# Patient Record
Sex: Female | Born: 1975 | Race: Black or African American | Hispanic: No | Marital: Single | Smoking: Former smoker
Health system: Southern US, Community
[De-identification: ages and names within clinical notes are randomized; demographics above are authoritative.]

## PROBLEM LIST (undated history)

## (undated) ENCOUNTER — Emergency Department (HOSPITAL_BASED_OUTPATIENT_CLINIC_OR_DEPARTMENT_OTHER): Source: Home / Self Care

## (undated) DIAGNOSIS — J471 Bronchiectasis with (acute) exacerbation: Secondary | ICD-10-CM

## (undated) DIAGNOSIS — J45909 Unspecified asthma, uncomplicated: Secondary | ICD-10-CM

## (undated) HISTORY — PX: TUBAL LIGATION: SHX77

---

## 1997-05-24 ENCOUNTER — Inpatient Hospital Stay (HOSPITAL_COMMUNITY): Admission: AD | Admit: 1997-05-24 | Discharge: 1997-05-24 | Payer: Self-pay | Admitting: Cardiology

## 1997-07-20 ENCOUNTER — Inpatient Hospital Stay (HOSPITAL_COMMUNITY): Admission: AD | Admit: 1997-07-20 | Discharge: 1997-07-20 | Payer: Self-pay | Admitting: Obstetrics

## 1997-08-17 ENCOUNTER — Other Ambulatory Visit: Admission: RE | Admit: 1997-08-17 | Discharge: 1997-08-17 | Payer: Self-pay | Admitting: Obstetrics

## 1997-09-23 ENCOUNTER — Inpatient Hospital Stay (HOSPITAL_COMMUNITY): Admission: AD | Admit: 1997-09-23 | Discharge: 1997-09-26 | Payer: Self-pay | Admitting: Obstetrics

## 1998-07-05 ENCOUNTER — Inpatient Hospital Stay (HOSPITAL_COMMUNITY): Admission: AD | Admit: 1998-07-05 | Discharge: 1998-07-09 | Payer: Self-pay | Admitting: Obstetrics & Gynecology

## 1998-07-05 ENCOUNTER — Encounter: Payer: Self-pay | Admitting: Obstetrics & Gynecology

## 1998-07-07 ENCOUNTER — Encounter: Payer: Self-pay | Admitting: Obstetrics & Gynecology

## 1998-08-09 ENCOUNTER — Inpatient Hospital Stay (HOSPITAL_COMMUNITY): Admission: AD | Admit: 1998-08-09 | Discharge: 1998-08-09 | Payer: Self-pay | Admitting: Obstetrics

## 1998-08-09 ENCOUNTER — Encounter: Payer: Self-pay | Admitting: Obstetrics & Gynecology

## 1998-08-16 ENCOUNTER — Inpatient Hospital Stay (HOSPITAL_COMMUNITY): Admission: AD | Admit: 1998-08-16 | Discharge: 1998-08-18 | Payer: Self-pay | Admitting: Obstetrics

## 1999-06-12 ENCOUNTER — Emergency Department (HOSPITAL_COMMUNITY): Admission: EM | Admit: 1999-06-12 | Discharge: 1999-06-12 | Payer: Self-pay | Admitting: Emergency Medicine

## 1999-08-21 ENCOUNTER — Emergency Department (HOSPITAL_COMMUNITY): Admission: EM | Admit: 1999-08-21 | Discharge: 1999-08-21 | Payer: Self-pay | Admitting: Emergency Medicine

## 2000-03-07 ENCOUNTER — Inpatient Hospital Stay (HOSPITAL_COMMUNITY): Admission: AD | Admit: 2000-03-07 | Discharge: 2000-03-07 | Payer: Self-pay | Admitting: Obstetrics & Gynecology

## 2000-03-13 ENCOUNTER — Ambulatory Visit (HOSPITAL_COMMUNITY): Admission: RE | Admit: 2000-03-13 | Discharge: 2000-03-13 | Payer: Self-pay | Admitting: Obstetrics

## 2000-03-13 ENCOUNTER — Encounter: Payer: Self-pay | Admitting: Obstetrics

## 2000-03-29 ENCOUNTER — Inpatient Hospital Stay (HOSPITAL_COMMUNITY): Admission: AD | Admit: 2000-03-29 | Discharge: 2000-03-29 | Payer: Self-pay | Admitting: Obstetrics & Gynecology

## 2000-04-10 ENCOUNTER — Inpatient Hospital Stay (HOSPITAL_COMMUNITY): Admission: AD | Admit: 2000-04-10 | Discharge: 2000-04-10 | Payer: Self-pay | Admitting: *Deleted

## 2000-06-04 ENCOUNTER — Inpatient Hospital Stay: Admission: AD | Admit: 2000-06-04 | Discharge: 2000-06-04 | Payer: Self-pay | Admitting: Obstetrics

## 2000-06-19 ENCOUNTER — Inpatient Hospital Stay (HOSPITAL_COMMUNITY): Admission: RE | Admit: 2000-06-19 | Discharge: 2000-06-19 | Payer: Self-pay | Admitting: *Deleted

## 2000-06-22 ENCOUNTER — Encounter (HOSPITAL_COMMUNITY): Admission: RE | Admit: 2000-06-22 | Discharge: 2000-06-26 | Payer: Self-pay | Admitting: *Deleted

## 2000-06-24 ENCOUNTER — Inpatient Hospital Stay (HOSPITAL_COMMUNITY): Admission: AD | Admit: 2000-06-24 | Discharge: 2000-06-27 | Payer: Self-pay | Admitting: Obstetrics

## 2000-06-24 ENCOUNTER — Encounter (INDEPENDENT_AMBULATORY_CARE_PROVIDER_SITE_OTHER): Payer: Self-pay | Admitting: Specialist

## 2001-10-12 ENCOUNTER — Encounter: Payer: Self-pay | Admitting: Emergency Medicine

## 2001-10-12 ENCOUNTER — Emergency Department (HOSPITAL_COMMUNITY): Admission: EM | Admit: 2001-10-12 | Discharge: 2001-10-12 | Payer: Self-pay | Admitting: Emergency Medicine

## 2002-11-09 ENCOUNTER — Emergency Department (HOSPITAL_COMMUNITY): Admission: EM | Admit: 2002-11-09 | Discharge: 2002-11-09 | Payer: Self-pay | Admitting: *Deleted

## 2003-08-12 ENCOUNTER — Emergency Department (HOSPITAL_COMMUNITY): Admission: EM | Admit: 2003-08-12 | Discharge: 2003-08-12 | Payer: Self-pay | Admitting: Emergency Medicine

## 2003-11-27 ENCOUNTER — Emergency Department (HOSPITAL_COMMUNITY): Admission: EM | Admit: 2003-11-27 | Discharge: 2003-11-28 | Payer: Self-pay | Admitting: *Deleted

## 2005-12-17 ENCOUNTER — Emergency Department (HOSPITAL_COMMUNITY): Admission: EM | Admit: 2005-12-17 | Discharge: 2005-12-17 | Payer: Self-pay | Admitting: Emergency Medicine

## 2006-05-24 ENCOUNTER — Emergency Department (HOSPITAL_COMMUNITY): Admission: EM | Admit: 2006-05-24 | Discharge: 2006-05-24 | Payer: Self-pay | Admitting: Emergency Medicine

## 2007-04-17 ENCOUNTER — Inpatient Hospital Stay (HOSPITAL_COMMUNITY): Admission: AD | Admit: 2007-04-17 | Discharge: 2007-04-17 | Payer: Self-pay | Admitting: Obstetrics and Gynecology

## 2008-06-14 ENCOUNTER — Emergency Department (HOSPITAL_COMMUNITY): Admission: EM | Admit: 2008-06-14 | Discharge: 2008-06-14 | Payer: Self-pay | Admitting: Family Medicine

## 2009-05-27 ENCOUNTER — Emergency Department (HOSPITAL_COMMUNITY): Admission: EM | Admit: 2009-05-27 | Discharge: 2009-05-27 | Payer: Self-pay | Admitting: Emergency Medicine

## 2009-07-21 ENCOUNTER — Emergency Department (HOSPITAL_COMMUNITY): Admission: EM | Admit: 2009-07-21 | Discharge: 2009-07-21 | Payer: Self-pay | Admitting: Family Medicine

## 2010-07-08 LAB — POCT I-STAT, CHEM 8
Calcium, Ion: 1.19 mmol/L (ref 1.12–1.32)
Chloride: 106 mEq/L (ref 96–112)
HCT: 40 % (ref 36.0–46.0)
Hemoglobin: 13.6 g/dL (ref 12.0–15.0)
Potassium: 3.6 mEq/L (ref 3.5–5.1)

## 2010-07-08 LAB — POCT PREGNANCY, URINE: Preg Test, Ur: NEGATIVE

## 2010-08-02 LAB — POCT PREGNANCY, URINE: Preg Test, Ur: NEGATIVE

## 2010-09-02 NOTE — Discharge Summary (Signed)
[facility]  Patient:    Wendy Morgan, Wendy Morgan                       MRN: [id number] Adm. Date:  [id number] Disc. Date: [id number] Attending:  Tammi Sou Dictator:   Marta Lamas, M.D.                           Discharge Summary  ADMIT DIAGNOSES:              1. Intrauterine pregnancy at 38 weeks and 3                                  days.                               2. Labor.  DISCHARGE DIAGNOSES:          1. Vaginal birth delivery after cesarean                                  section.                               2. Permanent sterilization.  PROCEDURE:                    Bilateral tubal ligation-Pomeroy method, June 26, 2000.  CONSULTS:                     None.  HISTORY OF PRESENT ILLNESS:   Patient is a 35 year old African-American female G4, para 3-1-0-3 who was admitted for active labor.  See H&P for details.  HOSPITAL COURSE:              Patient was admitted to labor and delivery for active labor as noted above.  Epidural was requested and placed.  Patient had history of GBS with previous pregnancy, but was negative for GBS this pregnancy.  Thus, patient was started on penicillin during labor.  Patient had very slow labor, thus Pitocin was started to augment labor.  The fetus remained stable throughout the patients labor course.  Six hours after admission patient delivered successfully by spontaneous vaginal delivery over intact perineum.  Patient had a viable female infant with Apgars 8 and 9 at one and five minutes respectively.  The placenta was delivered intact with three vessel cord.  The mother and infant were stable.  Patient did well postpartum.  On postpartum day #1 patient had bilateral tubal ligation done. The procedure went well without any complications.  See operative report for details.  On hospital day #3 postoperative day #1 patient did well and was discharged with routine postpartum and postoperative  care.  DISCHARGE MEDICATIONS:        1. Percocet p.r.n.                               2. Motrin p.r.n.                               3. Prenatal vitamins.  CONDITION ON DISCHARGE:  Stable.  DISPOSITION:                  Home.  FOLLOW-UP:                    [facility] DD:  07/12/00 TD:  07/12/00 Job: [id number]

## 2010-09-02 NOTE — Op Note (Signed)
[facility]  Patient:    Wendy Morgan, Wendy Morgan                       MRN: [id number] Proc. Date: 06/26/00 Adm. Date:  [id number] Disc. Date: [id number] Attending:  Michaelle Copas Dictator:   Jamey Reas, M.D.                           Operative Report  DATE OF BIRTH:                07/31/1975  PREOPERATIVE DIAGNOSIS:       Multiparity, desires permanent sterilization.  POSTOPERATIVE DIAGNOSIS:      Multiparity, desires permanent sterilization.  OPERATION:                    Postpartum tubal ligation, Pomeroy method.  SURGEON:                      Conni Elliot, M.D.  ASSISTANT:                    1. Jamey Reas, M.D.                               2. ______  ANESTHESIA:                   General endotracheal.  COMPLICATIONS:                None.  ESTIMATED BLOOD LOSS:         Less than 20 cc.  FLUIDS:                       1 L of lactated ringers.  INDICATIONS:                  A 35 year old African-American female G4, P3 1 0 4 postpartum day #1, status post successful vaginal birth after cesarean, who desires permanent sterilization.  The risks and benefits of the procedure were discussed with the patient including the risk of failure 3-5:1000 with the increased risk of ectopic gestation if pregnancy occurs.  FINDINGS:                     Normal uterus, tubes, and ovaries with occasional adhesion; otherwise normal.  PROCEDURE:                    The patient was taken to the operating room, where general endotracheal anesthesia was introduced and 6 cc of 0.50% Marcaine was introduced into the incision site.  A small, transverse infraumbilical skin incision was then made with the scalpel.  The incision was carried through to the underlying fascia, until the peritoneum was identified and entered.  The peritoneum was noted to be free of any adhesions and the incision was then extended with blunt dissection.  The  patients left fallopian tube was then identified, brought to the incision, and grasped with a Babcock clamp and the tube was then followed out to the fimbriae.  The Babcock clamp was then used to grasp the tube approximately 4 cm from the corneal region.  A 3 cm segment of tube was then ligated with two free ties of chromic and excised.  Good hemostasis was noted and  the tube was returned to the abdomen.  The right fallopian tube was then ligated in a similar fashion and a 3 cm segment excised.  Excellent hemostasis was noted and the tube returned to the abdomen.  The peritoneum was identified and closed with 2-0 chromic on an SH needle in a pursestring fashion.  The fascia was then closed using 0 Vicryl suture without complications.  Two interrupted Monocryl sutures were used to close the subcutaneous layer.  A subcuticular stitch was placed using 4-0 Vicryl suture in routine fashion.  The patient tolerated the procedure well.  Sponge, lap, and needle counts were correct x 2.  The patient was taken to the recovery room in stable condition.  Pathology segments of right and left fallopian tube. DD:  06/26/00 TD:  06/26/00 Job: [id number]

## 2011-01-20 LAB — POCT PREGNANCY, URINE: Operator id: 220991

## 2011-09-30 ENCOUNTER — Encounter (HOSPITAL_COMMUNITY): Payer: Self-pay | Admitting: *Deleted

## 2011-09-30 ENCOUNTER — Emergency Department (INDEPENDENT_AMBULATORY_CARE_PROVIDER_SITE_OTHER)
Admission: EM | Admit: 2011-09-30 | Discharge: 2011-09-30 | Disposition: A | Discharge status: Home / Self Care | Payer: BC Managed Care – PPO | Source: Home / Self Care | Attending: Family Medicine | Admitting: Family Medicine

## 2011-09-30 ENCOUNTER — Emergency Department (INDEPENDENT_AMBULATORY_CARE_PROVIDER_SITE_OTHER): Payer: BC Managed Care – PPO

## 2011-09-30 DIAGNOSIS — J45901 Unspecified asthma with (acute) exacerbation: Secondary | ICD-10-CM

## 2011-09-30 DIAGNOSIS — J209 Acute bronchitis, unspecified: Secondary | ICD-10-CM

## 2011-09-30 MED ORDER — AMOXICILLIN-POT CLAVULANATE 875-125 MG PO TABS: 1.0000 | ORAL_TABLET | Freq: Two times a day (BID) | ORAL | Status: AC

## 2011-09-30 MED ORDER — TRIAMCINOLONE ACETONIDE 40 MG/ML IJ SUSP
40.0000 mg | Freq: Once | INTRAMUSCULAR | Status: AC
  Administered 2011-09-30: 40 mg via INTRAMUSCULAR

## 2011-09-30 MED ORDER — ALBUTEROL SULFATE (5 MG/ML) 0.5% IN NEBU
INHALATION_SOLUTION | RESPIRATORY_TRACT | Status: AC
  Filled 2011-09-30: qty 1

## 2011-09-30 MED ORDER — TRIAMCINOLONE ACETONIDE 40 MG/ML IJ SUSP
INTRAMUSCULAR | Status: AC
  Filled 2011-09-30: qty 5

## 2011-09-30 MED ORDER — METHYLPREDNISOLONE SODIUM SUCC 125 MG IJ SOLR
125.0000 mg | Freq: Once | INTRAMUSCULAR | Status: AC
  Administered 2011-09-30: 125 mg via INTRAMUSCULAR

## 2011-09-30 MED ORDER — ALBUTEROL SULFATE (5 MG/ML) 0.5% IN NEBU
5.0000 mg | INHALATION_SOLUTION | Freq: Once | RESPIRATORY_TRACT | Status: AC
  Administered 2011-09-30: 5 mg via RESPIRATORY_TRACT

## 2011-09-30 MED ORDER — METHYLPREDNISOLONE SODIUM SUCC 125 MG IJ SOLR
INTRAMUSCULAR | Status: AC
  Filled 2011-09-30: qty 2

## 2011-09-30 MED ORDER — ALBUTEROL SULFATE HFA 108 (90 BASE) MCG/ACT IN AERS: 1.0000 | INHALATION_SPRAY | Freq: Four times a day (QID) | RESPIRATORY_TRACT | Status: DC | PRN

## 2011-09-30 MED ORDER — DEXTROMETHORPHAN POLISTIREX 30 MG/5ML PO LQCR: 60.0000 mg | ORAL | Status: AC | PRN

## 2011-09-30 MED ORDER — IPRATROPIUM BROMIDE 0.02 % IN SOLN
0.5000 mg | Freq: Once | RESPIRATORY_TRACT | Status: AC
  Administered 2011-09-30: 0.5 mg via RESPIRATORY_TRACT

## 2011-09-30 NOTE — Discharge Instructions (Signed)
Take all of medicine, drink lots of fluids, no more smoking, see your doctor if further problems  °

## 2011-09-30 NOTE — ED Provider Notes (Signed)
History     CSN: [id number]  Arrival date & time 09/30/11  1234   First MD Initiated Contact with Patient 09/30/11 1253      Chief Complaint  Patient presents with  . Cough  . Nasal Congestion  . Wheezing  . Shortness of Breath    (Consider location/radiation/quality/duration/timing/severity/associated sxs/prior treatment) Patient is a 36 y.o. female presenting with cough, wheezing, and shortness of breath. The history is provided by the patient.  Cough This is a new problem. The current episode started more than 1 week ago (3wk h/o sx , smoker, clear to yellow phlegm.). The problem has been gradually worsening. The cough is productive of sputum. There has been no fever. Associated symptoms include shortness of breath and wheezing. Pertinent negatives include no rhinorrhea. She is a smoker. Her past medical history is significant for bronchitis.  Wheezing  Associated symptoms include cough, shortness of breath and wheezing. Pertinent negatives include no rhinorrhea.  Shortness of Breath  Associated symptoms include cough, shortness of breath and wheezing. Pertinent negatives include no rhinorrhea.    History reviewed. No pertinent past medical history.  History reviewed. No pertinent past surgical history.  History reviewed. No pertinent family history.  History  Substance Use Topics  . Smoking status: Current Everyday Smoker  . Smokeless tobacco: Not on file  . Alcohol Use: No    OB History    Grav Para Term Preterm Abortions TAB SAB Ect Mult Living                  Review of Systems  Constitutional: Negative.   HENT: Negative for congestion, rhinorrhea and postnasal drip.   Respiratory: Positive for cough, shortness of breath and wheezing.   Cardiovascular: Negative.   Gastrointestinal: Negative.     Allergies  Review of patient's allergies indicates no known allergies.  Home Medications   Current Outpatient Rx  Name Route Sig Dispense Refill  .  ALBUTEROL SULFATE HFA 108 (90 BASE) MCG/ACT IN AERS Inhalation Inhale 1-2 puffs into the lungs every 6 (six) hours as needed for wheezing. 1 Inhaler 0  . AMOXICILLIN-POT CLAVULANATE 875-125 MG PO TABS Oral Take 1 tablet by mouth 2 (two) times daily. 20 tablet 0  . DEXTROMETHORPHAN POLISTIREX ER 30 MG/5ML PO LQCR Oral Take 10 mLs (60 mg total) by mouth as needed for cough. 89 mL 0    BP 141/91  Pulse 72  Temp 98.3 F (36.8 C) (Oral)  Resp 20  SpO2 94%  LMP 09/04/2011  Physical Exam  Nursing note and vitals reviewed. Constitutional: She is oriented to person, place, and time. She appears well-developed and well-nourished.  HENT:  Head: Normocephalic.  Right Ear: External ear normal.  Left Ear: External ear normal.  Mouth/Throat: Oropharynx is clear and moist.  Eyes: Pupils are equal, round, and reactive to light.  Neck: Normal range of motion. Neck supple.  Cardiovascular: Normal rate, regular rhythm and normal heart sounds.   Pulmonary/Chest: No respiratory distress. She has wheezes. She has rhonchi.  Abdominal: Soft. Bowel sounds are normal.  Lymphadenopathy:    She has no cervical adenopathy.  Neurological: She is alert and oriented to person, place, and time.  Skin: Skin is warm and dry.    ED Course  Procedures (including critical care time)  Labs Reviewed - No data to display Dg Chest 2 View  09/30/2011  *RADIOLOGY REPORT*  Clinical Data: Cough, shortness of breath for 3 weeks  CHEST - 2 VIEW  Comparison: 07/21/2009  Findings: Unchanged cardiac silhouette and mediastinal contours. There is mild diffuse thickening of the pulmonary interstitium about the pulmonary hila.  No focal airspace opacities.  Granuloma overlies the left mid lung.  No pleural effusion or pneumothorax. Unchanged bones.  IMPRESSION: Findings most suggestive of airways disease, though atypical infection may have a similar appearance.  No focal airspace opacities to suggest bronchopneumonia.  Original Report  Authenticated By: Waynard Reeds, M.D.     1. Bronchitis with asthma, acute       MDM  X-rays reviewed and report per radiologist. Sx improved after neb, lungs wheezing almost clear.         Linna Hoff, MD 09/30/11 1500

## 2011-09-30 NOTE — ED Notes (Signed)
Pt with c/o cough/congestion x 3 weeks sob/wheezing - productive cough yellowish - cough spells

## 2012-01-22 ENCOUNTER — Emergency Department (HOSPITAL_COMMUNITY): Payer: Self-pay

## 2012-01-22 ENCOUNTER — Emergency Department (HOSPITAL_COMMUNITY)
Admission: EM | Admit: 2012-01-22 | Discharge: 2012-01-22 | Disposition: A | Discharge status: Home / Self Care | Payer: Self-pay | Attending: Emergency Medicine | Admitting: Emergency Medicine

## 2012-01-22 ENCOUNTER — Encounter (HOSPITAL_COMMUNITY): Payer: Self-pay | Admitting: Emergency Medicine

## 2012-01-22 DIAGNOSIS — R0602 Shortness of breath: Secondary | ICD-10-CM | POA: Insufficient documentation

## 2012-01-22 DIAGNOSIS — R079 Chest pain, unspecified: Secondary | ICD-10-CM | POA: Insufficient documentation

## 2012-01-22 DIAGNOSIS — J40 Bronchitis, not specified as acute or chronic: Secondary | ICD-10-CM

## 2012-01-22 DIAGNOSIS — Z79899 Other long term (current) drug therapy: Secondary | ICD-10-CM | POA: Insufficient documentation

## 2012-01-22 DIAGNOSIS — F17200 Nicotine dependence, unspecified, uncomplicated: Secondary | ICD-10-CM | POA: Insufficient documentation

## 2012-01-22 MED ORDER — ALBUTEROL SULFATE (5 MG/ML) 0.5% IN NEBU
5.0000 mg | INHALATION_SOLUTION | Freq: Once | RESPIRATORY_TRACT | Status: AC
  Administered 2012-01-22: 5 mg via RESPIRATORY_TRACT

## 2012-01-22 MED ORDER — DEXAMETHASONE 6 MG PO TABS
10.0000 mg | ORAL_TABLET | Freq: Once | ORAL | Status: AC
  Administered 2012-01-22: 10 mg via ORAL
  Filled 2012-01-22: qty 1

## 2012-01-22 MED ORDER — HYDROCODONE-HOMATROPINE 5-1.5 MG/5ML PO SYRP: 5.0000 mL | ORAL_SOLUTION | Freq: Four times a day (QID) | ORAL | Status: DC | PRN

## 2012-01-22 MED ORDER — IPRATROPIUM BROMIDE 0.02 % IN SOLN
0.5000 mg | Freq: Once | RESPIRATORY_TRACT | Status: AC
  Administered 2012-01-22: 0.5 mg via RESPIRATORY_TRACT

## 2012-01-22 MED ORDER — IPRATROPIUM BROMIDE 0.02 % IN SOLN
RESPIRATORY_TRACT | Status: AC
  Administered 2012-01-22: 0.5 mg via RESPIRATORY_TRACT
  Filled 2012-01-22: qty 2.5

## 2012-01-22 MED ORDER — AZITHROMYCIN 250 MG PO TABS: 250.0000 mg | ORAL_TABLET | Freq: Every day | ORAL | Status: DC

## 2012-01-22 MED ORDER — ALBUTEROL SULFATE HFA 108 (90 BASE) MCG/ACT IN AERS
2.0000 | INHALATION_SPRAY | RESPIRATORY_TRACT | Status: DC | PRN
  Administered 2012-01-22: 2 via RESPIRATORY_TRACT
  Filled 2012-01-22: qty 6.7

## 2012-01-22 MED ORDER — ALBUTEROL SULFATE (5 MG/ML) 0.5% IN NEBU
INHALATION_SOLUTION | RESPIRATORY_TRACT | Status: AC
  Administered 2012-01-22: 5 mg via RESPIRATORY_TRACT
  Filled 2012-01-22: qty 1

## 2012-01-22 NOTE — ED Notes (Signed)
Pt seen at [facility] for same in June.  Pt out of albuterol inhaler that was given at that time.  Pt denies hx of asthma.

## 2012-01-22 NOTE — ED Provider Notes (Signed)
History     CSN: [id number]  Arrival date & time 01/22/12  6295   First MD Initiated Contact with Patient 01/22/12 [phone]      Chief Complaint  Patient presents with  . Shortness of Breath    (Consider location/radiation/quality/duration/timing/severity/associated sxs/prior treatment) HPI This is a 36 year old black female with a one-month history of cough and chest tightness. The cough has been occasionally productive. She woke up this morning acutely short of breath. Her symptoms are moderate to severe but she used her mother's inhaler with partial relief. She then came to the ED for further evaluation. She has been wheezing. She has had posttussive emesis during some paroxysms of cough. She denies fever or chills. She has a history of reactive airways disease but no longer has an inhaler.  History reviewed. No pertinent past medical history.  History reviewed. No pertinent past surgical history.  No family history on file.  History  Substance Use Topics  . Smoking status: Current Every Day Smoker  . Smokeless tobacco: Not on file  . Alcohol Use: No    OB History    Grav Para Term Preterm Abortions TAB SAB Ect Mult Living                  Review of Systems  All other systems reviewed and are negative.    Allergies  Review of patient's allergies indicates no known allergies.  Home Medications   Current Outpatient Rx  Name Route Sig Dispense Refill  . ALBUTEROL SULFATE HFA 108 (90 BASE) MCG/ACT IN AERS Inhalation Inhale 1-2 puffs into the lungs every 6 (six) hours as needed for wheezing. 1 Inhaler 0  . GUAIFENESIN-DM 100-10 MG/5ML PO SYRP Oral Take 15 mLs by mouth 3 (three) times daily as needed. For cough      BP 144/91  Pulse 71  Temp 97.4 F (36.3 C) (Oral)  Resp 20  SpO2 94%  LMP 01/12/2012  Physical Exam General: Well-developed, well-nourished female in no acute distress; appearance consistent with age of record HENT: normocephalic, atraumatic; no  pharyngeal erythema or exudate Eyes: pupils equal round and reactive to light; extraocular muscles intact Neck: supple Heart: regular rate and rhythm Lungs: Decreased air movement bilaterally with expiratory wheezing Abdomen: soft; nondistended; nontender Extremities: No deformity; full range of motion; pulses normal; no edema Neurologic: Awake, alert and oriented; motor function intact in all extremities and symmetric; no facial droop Skin: Warm and dry Psychiatric: Normal mood and affect    ED Course  Procedures (including critical care time)     MDM   Nursing notes and vitals signs, including pulse oximetry, reviewed.  Summary of this visit's results, reviewed by myself:  Imaging Studies: Dg Chest 2 View  01/22/2012  *RADIOLOGY REPORT*  Clinical Data: 36 year old female cough and shortness of breath.  CHEST - 2 VIEW  Comparison: 09/30/2011.  Findings: Incidental hair artifact.  Lung volumes are stable and within normal limits.  Cardiac size and mediastinal contours are within normal limits.  Visualized tracheal air column is within normal limits.  No pneumothorax, pulmonary edema, pleural effusion or confluent pulmonary opacity. No acute osseous abnormality identified.  IMPRESSION: No acute cardiopulmonary abnormality.   Original Report Authenticated By: Harley Hallmark, M.D.        Date: 01/22/2012 5:28 AM  Rate: 74  Rhythm: normal sinus rhythm  QRS Axis: normal  Intervals: normal  ST/T Wave abnormalities: normal  Conduction Disutrbances: none  Narrative Interpretation: unremarkable  Comparison with previous EKG:  None available  7:03 AM Wheezing resolved, or movement improved, patient feels better after albuterol and Atrovent neb treatment.        Hanley Seamen, MD 01/22/12 [phone]

## 2012-01-22 NOTE — ED Notes (Signed)
PT. REPORTS SOB WITH CHEST TIGHTNESS ,PRODUCTIVE COUGH / CHEST CONGESTION FOR SEVERAL WEEKS WORSE THIS MORNING .

## 2012-03-26 ENCOUNTER — Emergency Department (HOSPITAL_COMMUNITY)
Admission: EM | Admit: 2012-03-26 | Discharge: 2012-03-26 | Disposition: A | Discharge status: Home / Self Care | Payer: Self-pay | Attending: Emergency Medicine | Admitting: Emergency Medicine

## 2012-03-26 ENCOUNTER — Encounter (HOSPITAL_COMMUNITY): Payer: Self-pay

## 2012-03-26 DIAGNOSIS — R0602 Shortness of breath: Secondary | ICD-10-CM | POA: Insufficient documentation

## 2012-03-26 DIAGNOSIS — J45909 Unspecified asthma, uncomplicated: Secondary | ICD-10-CM | POA: Insufficient documentation

## 2012-03-26 DIAGNOSIS — R05 Cough: Secondary | ICD-10-CM | POA: Insufficient documentation

## 2012-03-26 DIAGNOSIS — R062 Wheezing: Secondary | ICD-10-CM | POA: Insufficient documentation

## 2012-03-26 DIAGNOSIS — J40 Bronchitis, not specified as acute or chronic: Secondary | ICD-10-CM

## 2012-03-26 DIAGNOSIS — R0789 Other chest pain: Secondary | ICD-10-CM | POA: Insufficient documentation

## 2012-03-26 DIAGNOSIS — Z8709 Personal history of other diseases of the respiratory system: Secondary | ICD-10-CM | POA: Insufficient documentation

## 2012-03-26 DIAGNOSIS — R059 Cough, unspecified: Secondary | ICD-10-CM | POA: Insufficient documentation

## 2012-03-26 DIAGNOSIS — Z87891 Personal history of nicotine dependence: Secondary | ICD-10-CM | POA: Insufficient documentation

## 2012-03-26 HISTORY — DX: Bronchiectasis with (acute) exacerbation: J47.1

## 2012-03-26 HISTORY — DX: Unspecified asthma, uncomplicated: J45.909

## 2012-03-26 MED ORDER — PREDNISONE 20 MG PO TABS
40.0000 mg | ORAL_TABLET | Freq: Once | ORAL | Status: AC
  Administered 2012-03-26: 40 mg via ORAL
  Filled 2012-03-26: qty 2

## 2012-03-26 MED ORDER — ACETAMINOPHEN-CODEINE 120-12 MG/5ML PO SUSP
10.0000 mL | Freq: Three times a day (TID) | ORAL | Status: DC | PRN
Start: 2012-03-26 — End: 2013-07-28

## 2012-03-26 MED ORDER — PREDNISONE 20 MG PO TABS: 40.0000 mg | ORAL_TABLET | Freq: Every day | ORAL | Status: DC

## 2012-03-26 MED ORDER — ALBUTEROL SULFATE HFA 108 (90 BASE) MCG/ACT IN AERS
1.0000 | INHALATION_SPRAY | Freq: Four times a day (QID) | RESPIRATORY_TRACT | Status: DC | PRN
  Administered 2012-03-26: 2 via RESPIRATORY_TRACT
  Filled 2012-03-26: qty 6.7

## 2012-03-26 NOTE — Discharge Instructions (Signed)
 Bronchitis Bronchitis is the body's way of reacting to injury and/or infection (inflammation) of the bronchi. Bronchi are the air tubes that extend from the windpipe into the lungs. If the inflammation becomes severe, it may cause shortness of breath. CAUSES  Inflammation may be caused by:  A virus.  Germs (bacteria).  Dust.  Allergens.  Pollutants and many other irritants. The cells lining the bronchial tree are covered with tiny hairs (cilia). These constantly beat upward, away from the lungs, toward the mouth. This keeps the lungs free of pollutants. When these cells become too irritated and are unable to do their job, mucus begins to develop. This causes the characteristic cough of bronchitis. The cough clears the lungs when the cilia are unable to do their job. Without either of these protective mechanisms, the mucus would settle in the lungs. Then you would develop pneumonia. Smoking is a common cause of bronchitis and can contribute to pneumonia. Stopping this habit is the single most important thing you can do to help yourself. TREATMENT   Your caregiver may prescribe an antibiotic if the cough is caused by bacteria. Also, medicines that open up your airways make it easier to breathe. Your caregiver may also recommend or prescribe an expectorant. It will loosen the mucus to be coughed up. Only take over-the-counter or prescription medicines for pain, discomfort, or fever as directed by your caregiver.  Removing whatever causes the problem (smoking, for example) is critical to preventing the problem from getting worse.  Cough suppressants may be prescribed for relief of cough symptoms.  Inhaled medicines may be prescribed to help with symptoms now and to help prevent problems from returning.  For those with recurrent (chronic) bronchitis, there may be a need for steroid medicines. SEEK IMMEDIATE MEDICAL CARE IF:   During treatment, you develop more pus-like mucus (purulent  sputum).  You have a fever.  You become progressively more ill.  You have increased difficulty breathing, wheezing, or shortness of breath. It is necessary to seek immediate medical care if you are elderly or sick from any other disease. MAKE SURE YOU:   Understand these instructions.  Will watch your condition.  Will get help right away if you are not doing well or get worse. Document Released: 04/03/2005 Document Revised: 06/26/2011 Document Reviewed: 02/11/2008 [facility] Patient Information 2013 [place], [place].    Narcotic and benzodiazepine use may cause drowsiness, slowed breathing or dependence.  Please use with caution and do not drive, operate machinery or watch young children alone while taking them.  Taking combinations of these medications or drinking alcohol will potentiate these effects.

## 2012-03-26 NOTE — ED Provider Notes (Signed)
History     CSN: [id number]  Arrival date & time 03/26/12  0801   First MD Initiated Contact with Patient 03/26/12 [phone]      Chief Complaint  Patient presents with  . Asthma    (Consider location/radiation/quality/duration/timing/severity/associated sxs/prior treatment) HPI Comments: Pt with coughing, SOB, wheezing for several days, quit smoking about 2 months ago.  Pt is unsure of sick contacts, no N/V/D.  She denies fever or chills, appetite is maintained.  She reports coughing is getting worse and now has chest soreness diffusely with coughing.  No sweats, weight loss.  She has used an inhaler for asthma in the past, but ran out and had no refills, has no PCP.  She has been on prednisone in the past, never in ICU or intubated.  Pain doesn't radiate to arms, jaw, back.    Patient is a 36 y.o. female presenting with asthma. The history is provided by the patient.  Asthma Associated symptoms include chest pain. Pertinent negatives include no abdominal pain.    Past Medical History  Diagnosis Date  . Asthma   . Bronchiectasis with acute exacerbation     History reviewed. No pertinent past surgical history.  History reviewed. No pertinent family history.  History  Substance Use Topics  . Smoking status: Former Games developer  . Smokeless tobacco: Not on file  . Alcohol Use: No    OB History    Grav Para Term Preterm Abortions TAB SAB Ect Mult Living                  Review of Systems  Constitutional: Negative for fever, chills, diaphoresis and appetite change.  HENT: Negative for congestion and rhinorrhea.   Respiratory: Positive for cough, chest tightness and wheezing.   Cardiovascular: Positive for chest pain. Negative for palpitations and leg swelling.  Gastrointestinal: Negative for nausea, vomiting, abdominal pain and diarrhea.  Musculoskeletal: Negative for back pain.    Allergies  Review of patient's allergies indicates no known allergies.  Home Medications    Current Outpatient Rx  Name  Route  Sig  Dispense  Refill  . ALBUTEROL SULFATE HFA 108 (90 BASE) MCG/ACT IN AERS   Inhalation   Inhale 1-2 puffs into the lungs every 6 (six) hours as needed for wheezing.   1 Inhaler   0   . ACETAMINOPHEN-CODEINE 120-12 MG/5ML PO SUSP   Oral   Take 10 mLs by mouth every 8 (eight) hours as needed for pain (or coughing).   100 mL   0   . PREDNISONE 20 MG PO TABS   Oral   Take 2 tablets (40 mg total) by mouth daily.   12 tablet   0     BP 125/86  Pulse 64  Temp 97.9 F (36.6 C) (Oral)  Resp 24  SpO2 98%  LMP 02/16/2012  Physical Exam  Nursing note and vitals reviewed. Constitutional: She is oriented to person, place, and time. She appears well-developed and well-nourished.  HENT:  Head: Normocephalic and atraumatic.  Eyes: EOM are normal. Pupils are equal, round, and reactive to light.  Neck: Normal range of motion. Neck supple.  Cardiovascular: Normal rate and regular rhythm.   Pulmonary/Chest: Effort normal. No respiratory distress. She has wheezes.  Abdominal: Soft. She exhibits no distension. There is no tenderness.  Musculoskeletal: She exhibits no edema and no tenderness.  Neurological: She is alert and oriented to person, place, and time.  Skin: Skin is warm. No rash noted.  Psychiatric: She  has a normal mood and affect.    ED Course  Procedures (including critical care time)  Labs Reviewed - No data to display No results found.   1. Bronchitis    ra sat is 98% and I interpret to be normal.   MDM  Pt with mild exp wheezing, coughing, recently quit smoking, likely mild URI triggering bronchitis.  Codeine for pain and coughing, steroids, albuterol.  No sig distress, no fever.        Gavin Pound. Oletta Lamas, MD 03/26/12 [phone]

## 2012-03-26 NOTE — ED Notes (Signed)
Pt with one week of coughing, wheezing and chest pain with coughing,

## 2013-07-28 ENCOUNTER — Encounter (HOSPITAL_COMMUNITY): Payer: Self-pay | Admitting: Emergency Medicine

## 2013-07-28 ENCOUNTER — Emergency Department (HOSPITAL_COMMUNITY)
Admission: EM | Admit: 2013-07-28 | Discharge: 2013-07-28 | Disposition: A | Discharge status: Home / Self Care | Payer: BC Managed Care – PPO | Attending: Emergency Medicine | Admitting: Emergency Medicine

## 2013-07-28 ENCOUNTER — Emergency Department (HOSPITAL_COMMUNITY): Payer: BC Managed Care – PPO

## 2013-07-28 DIAGNOSIS — J40 Bronchitis, not specified as acute or chronic: Secondary | ICD-10-CM

## 2013-07-28 DIAGNOSIS — Z87891 Personal history of nicotine dependence: Secondary | ICD-10-CM | POA: Insufficient documentation

## 2013-07-28 DIAGNOSIS — J45901 Unspecified asthma with (acute) exacerbation: Secondary | ICD-10-CM | POA: Insufficient documentation

## 2013-07-28 MED ORDER — ALBUTEROL SULFATE (2.5 MG/3ML) 0.083% IN NEBU
INHALATION_SOLUTION | RESPIRATORY_TRACT | Status: AC
  Filled 2013-07-28: qty 3

## 2013-07-28 MED ORDER — ALBUTEROL SULFATE (2.5 MG/3ML) 0.083% IN NEBU
5.0000 mg | INHALATION_SOLUTION | Freq: Once | RESPIRATORY_TRACT | Status: AC
  Administered 2013-07-28: 5 mg via RESPIRATORY_TRACT
  Filled 2013-07-28: qty 6

## 2013-07-28 MED ORDER — ALBUTEROL SULFATE HFA 108 (90 BASE) MCG/ACT IN AERS
2.0000 | INHALATION_SPRAY | RESPIRATORY_TRACT | Status: DC | PRN
  Administered 2013-07-28: 2 via RESPIRATORY_TRACT
  Filled 2013-07-28: qty 6.7

## 2013-07-28 MED ORDER — PREDNISONE 20 MG PO TABS
60.0000 mg | ORAL_TABLET | Freq: Once | ORAL | Status: AC
  Administered 2013-07-28: 60 mg via ORAL
  Filled 2013-07-28: qty 3

## 2013-07-28 MED ORDER — IPRATROPIUM BROMIDE 0.02 % IN SOLN
0.5000 mg | Freq: Once | RESPIRATORY_TRACT | Status: AC
  Administered 2013-07-28: 0.5 mg via RESPIRATORY_TRACT
  Filled 2013-07-28: qty 2.5

## 2013-07-28 MED ORDER — PREDNISONE 10 MG PO TABS: 20.0000 mg | ORAL_TABLET | Freq: Two times a day (BID) | ORAL | Status: DC

## 2013-07-28 NOTE — ED Notes (Signed)
Per pt sts 1 week of nasal congestion, productive cough. sts she has been taking OTC meds without relief. Pt sounds congested.

## 2013-07-28 NOTE — ED Provider Notes (Signed)
CSN: [id number]     Arrival date & time 07/28/13  1452 History  This chart was scribed for non-physician practitioner, Mackie PaiKatie SchineIver, PA-C working with Glynn OctaveStephen Rancour, MD by Luisa DagoPriscilla Tutu, ED scribe. This patient was seen in room TR07C/TR07C and the patient's care was started at 4:30 PM.    Chief Complaint  Patient presents with  . URI    The history is provided by the patient. No language interpreter was used.   HPI Comments: Wendy Morgan is a 38 y.o. Female with a history of asthma presents to the Emergency Department complaining of worsening chest congestion that started 1 week ago. Pt is also complaining of associated cough, wheezing, SOB, rhinorrhea, chills, and generalized myalgias. She reports taking Sudafed and alka-seltzer with minimal reliefs. Pt is currently receiving a nebulizer treatment here at the ED. She denies any nausea, fever, abdominal pain, or diarrhea.    Past Medical History  Diagnosis Date  . Asthma   . Bronchiectasis with acute exacerbation    History reviewed. No pertinent past surgical history. History reviewed. No pertinent family history. History  Substance Use Topics  . Smoking status: Former Games developermoker  . Smokeless tobacco: Not on file  . Alcohol Use: No   OB History   Grav Para Term Preterm Abortions TAB SAB Ect Mult Living                 Review of Systems  Constitutional: Positive for chills.  HENT: Positive for congestion and rhinorrhea.   Respiratory: Positive for cough.   Musculoskeletal: Positive for myalgias.  All other systems reviewed and are negative.     Allergies  Review of patient's allergies indicates no known allergies.  Home Medications   Current Outpatient Rx  Name  Route  Sig  Dispense  Refill  . Chlorpheniramine-Phenylephrine (SUDAFED PE SINUS/ALLERGY PO)   Oral   Take 1 tablet by mouth 2 (two) times daily as needed (congestion and cough).         . Phenyleph-CPM-DM-APAP (ALKA-SELTZER PLUS COLD & FLU PO)   Oral   Take 1 tablet by mouth 2 (two) times daily as needed (cough).          BP 114/72  Pulse 70  Temp(Src) 97.6 F (36.4 C) (Oral)  Resp 18  Ht 5\' 4"  (1.626 m)  Wt 140 lb (63.504 kg)  BMI 24.02 kg/m2  SpO2 97%  LMP 06/15/2013 Physical Exam  Nursing note and vitals reviewed. Constitutional: She is oriented to person, place, and time. She appears well-developed and well-nourished. No distress.  HENT:  Head: Normocephalic and atraumatic.  Posterior pharynx and soft palate erythematous.  No tonsillar edema or exudate.  Uvula mid-line.  No trismus.    Eyes:  Normal appearance  Neck: Normal range of motion. Neck supple.  Cardiovascular: Normal rate and regular rhythm.   Pulmonary/Chest: Effort normal and breath sounds normal. No respiratory distress. She exhibits no tenderness.  Shallow respirations.  Diminished breath sounds lung bases.  Inspiratory wheeze right lung base.  Coughing.   Musculoskeletal: Normal range of motion.  Lymphadenopathy:    She has cervical adenopathy.  Neurological: She is alert and oriented to person, place, and time.  Skin: Skin is warm and dry. No rash noted.  Psychiatric: She has a normal mood and affect. Her behavior is normal.    ED Course   Procedures (including critical care time)  DIAGNOSTIC STUDIES: Oxygen Saturation is 97% on RA, normal by my interpretation.  COORDINATION OF CARE: 4:35 PM-Will order nebulizer treatment.  Pt advised of plan for treatment and pt agrees. Medications  albuterol (PROVENTIL) (2.5 MG/3ML) 0.083% nebulizer solution 5 mg (5 mg Nebulization Given 07/28/13 1625)    Labs Review Labs Reviewed - No data to display Imaging Review Dg Chest 2 View (if Patient Has Fever And/or Copd)  07/28/2013   CLINICAL DATA:  Cough and congestion  EXAM: CHEST  2 VIEW  COMPARISON:  01/22/2012  FINDINGS: Cardiac shadow is within normal limits. The lungs are well aerated bilaterally. No focal infiltrate or sizable effusion is seen.   IMPRESSION: No acute abnormality noted.   Electronically Signed   By: Alcide CleverMark  Lukens M.D.   On: 07/28/2013 16:38     EKG Interpretation None      MDM   Final diagnoses:  Bronchitis    37yo F w/ documented h/o asthma (she denies any PMH) presents w/ cough, chest congestion wheezing, SOB x 1 week.  On exam, afebrile, mild pharyngeal injection, cervical adenopathy, shallow respirations, diminished breath sounds lung bases, inspiratory wheeze on right, coughing.  CXR negative for infiltrate.  Suspect viral illness.  Pt to receive a nebulizer treatment and prednisone.  Will reassess shortly.  4:59 PM  Breath sounds and cough improved.  Nursing staff ambulated on pulse ox and sats maintained at 100%.  Pt d/c'd home w/ albuterol inhaler and 5d course of prednisone for symptomatic treatment of bronchitis.  Return precautions discussed.   I personally performed the services described in this documentation, which was scribed in my presence. The recorded information has been reviewed and is accurate.    Otilio Miuatherine E Fatema Rabe, PA-C 07/29/13 0140

## 2013-07-28 NOTE — Discharge Instructions (Signed)
Take prednisone as prescribed.  Use albuterol inhaler, 2 puffs every 4 hours, as needed for cough and shortness of breath.  Please return to the ER if your symptoms have not started to improve in 7 days or you develop increased chest pain or shortness of breath.    Bronchitis Bronchitis is inflammation of the airways that extend from the windpipe into the lungs (bronchi). The inflammation often causes mucus to develop, which leads to a cough. If the inflammation becomes severe, it may cause shortness of breath. CAUSES  Bronchitis may be caused by:   Viral infections.   Bacteria.   Cigarette smoke.   Allergens, pollutants, and other irritants.  SIGNS AND SYMPTOMS  The most common symptom of bronchitis is a frequent cough that produces mucus. Other symptoms include:  Fever.   Body aches.   Chest congestion.   Chills.   Shortness of breath.   Sore throat.  DIAGNOSIS  Bronchitis is usually diagnosed through a medical history and physical exam. Tests, such as chest X-rays, are sometimes done to rule out other conditions.  TREATMENT  You may need to avoid contact with whatever caused the problem (smoking, for example). Medicines are sometimes needed. These may include:  Antibiotics. These may be prescribed if the condition is caused by bacteria.  Cough suppressants. These may be prescribed for relief of cough symptoms.   Inhaled medicines. These may be prescribed to help open your airways and make it easier for you to breathe.   Steroid medicines. These may be prescribed for those with recurrent (chronic) bronchitis. HOME CARE INSTRUCTIONS  Get plenty of rest.   Drink enough fluids to keep your urine clear or pale yellow (unless you have a medical condition that requires fluid restriction). Increasing fluids may help thin your secretions and will prevent dehydration.   Only take over-the-counter or prescription medicines as directed by your health care  provider.  Only take antibiotics as directed. Make sure you finish them even if you start to feel better.  Avoid secondhand smoke, irritating chemicals, and strong fumes. These will make bronchitis worse. If you are a smoker, quit smoking. Consider using nicotine gum or skin patches to help control withdrawal symptoms. Quitting smoking will help your lungs heal faster.   Put a cool-mist humidifier in your bedroom at night to moisten the air. This may help loosen mucus. Change the water in the humidifier daily. You can also run the hot water in your shower and sit in the bathroom with the door closed for 5 10 minutes.   Follow up with your health care provider as directed.   Wash your hands frequently to avoid catching bronchitis again or spreading an infection to others.  SEEK MEDICAL CARE IF: Your symptoms do not improve after 1 week of treatment.  SEEK IMMEDIATE MEDICAL CARE IF:  Your fever increases.  You have chills.   You have chest pain.   You have worsening shortness of breath.   You have bloody sputum.  You faint.  You have lightheadedness.  You have a severe headache.   You vomit repeatedly. MAKE SURE YOU:   Understand these instructions.  Will watch your condition.  Will get help right away if you are not doing well or get worse. Document Released: 04/03/2005 Document Revised: 01/22/2013 Document Reviewed: 11/26/2012 [facility] Patient Information 2014 [place], [place].

## 2013-07-29 NOTE — ED Provider Notes (Signed)
Medical screening examination/treatment/procedure(s) were performed by non-physician practitioner and as supervising physician I was immediately available for consultation/collaboration.   EKG Interpretation None       Glynn OctaveStephen Mckaylah Bettendorf, MD 07/29/13 [id number]

## 2013-10-27 ENCOUNTER — Emergency Department (HOSPITAL_COMMUNITY): Payer: BC Managed Care – PPO

## 2013-10-27 ENCOUNTER — Encounter (HOSPITAL_COMMUNITY): Payer: Self-pay | Admitting: Emergency Medicine

## 2013-10-27 ENCOUNTER — Emergency Department (HOSPITAL_COMMUNITY)
Admission: EM | Admit: 2013-10-27 | Discharge: 2013-10-27 | Disposition: A | Discharge status: Home / Self Care | Payer: BC Managed Care – PPO | Attending: Emergency Medicine | Admitting: Emergency Medicine

## 2013-10-27 DIAGNOSIS — M549 Dorsalgia, unspecified: Secondary | ICD-10-CM | POA: Insufficient documentation

## 2013-10-27 DIAGNOSIS — IMO0002 Reserved for concepts with insufficient information to code with codable children: Secondary | ICD-10-CM | POA: Insufficient documentation

## 2013-10-27 DIAGNOSIS — S46819A Strain of other muscles, fascia and tendons at shoulder and upper arm level, unspecified arm, initial encounter: Principal | ICD-10-CM

## 2013-10-27 DIAGNOSIS — X58XXXA Exposure to other specified factors, initial encounter: Secondary | ICD-10-CM | POA: Insufficient documentation

## 2013-10-27 DIAGNOSIS — Y929 Unspecified place or not applicable: Secondary | ICD-10-CM | POA: Insufficient documentation

## 2013-10-27 DIAGNOSIS — S43499A Other sprain of unspecified shoulder joint, initial encounter: Secondary | ICD-10-CM | POA: Insufficient documentation

## 2013-10-27 DIAGNOSIS — Z87891 Personal history of nicotine dependence: Secondary | ICD-10-CM | POA: Insufficient documentation

## 2013-10-27 DIAGNOSIS — S46811A Strain of other muscles, fascia and tendons at shoulder and upper arm level, right arm, initial encounter: Secondary | ICD-10-CM

## 2013-10-27 DIAGNOSIS — Y939 Activity, unspecified: Secondary | ICD-10-CM | POA: Insufficient documentation

## 2013-10-27 DIAGNOSIS — J45909 Unspecified asthma, uncomplicated: Secondary | ICD-10-CM | POA: Insufficient documentation

## 2013-10-27 MED ORDER — HYDROCODONE-ACETAMINOPHEN 5-325 MG PO TABS: 2.0000 | ORAL_TABLET | ORAL | Status: DC | PRN

## 2013-10-27 MED ORDER — CYCLOBENZAPRINE HCL 10 MG PO TABS: 10.0000 mg | ORAL_TABLET | Freq: Two times a day (BID) | ORAL | Status: DC | PRN

## 2013-10-27 NOTE — Discharge Instructions (Signed)
Take Vicodin as needed for pain. Take Flexeril as needed for muscle spasm. You may take these medications together. Apply heat to the affected area. Refer to attached documents for more information.

## 2013-10-27 NOTE — ED Notes (Signed)
Pt reports right shoulder pain x 2 days, worse with movement and palpation.Denies recent injury. Denies fever/chills, HA. NAD. AO x4.

## 2013-10-27 NOTE — ED Provider Notes (Signed)
CSN: [id number]     Arrival date & time 10/27/13  1048 History  This chart was scribed for non-physician practitioner Emilia BeckKaitlyn Jamiyah Dingley, PA-C working with Rolland PorterMark James, MD by Leone PayorSonum Patel, ED Scribe. This patient was seen in room TR10C/TR10C and the patient's care was started at 1:16 PM.    Chief Complaint  Patient presents with  . Shoulder Pain    The history is provided by the patient. No language interpreter was used.    HPI Comments: Wendy Morgan is a 38 y.o. female who presents to the Emergency Department complaining of constant, gradually worsened right shoulder pain that began 1-2 days ago. She denies recent injury or trauma to the affected area. She states the pain is worse with movement, applied pressure, and movement of the neck. She has tried taking aleve and using a heating pad with minimal relief. She denies any other symptoms.    Past Medical History  Diagnosis Date  . Asthma   . Bronchiectasis with acute exacerbation    History reviewed. No pertinent past surgical history. No family history on file. History  Substance Use Topics  . Smoking status: Former Games developermoker  . Smokeless tobacco: Not on file  . Alcohol Use: No   OB History   Grav Para Term Preterm Abortions TAB SAB Ect Mult Living                 Review of Systems  Constitutional: Negative for fever.  Musculoskeletal: Positive for arthralgias, back pain and myalgias.  Neurological: Negative for weakness and numbness.      Allergies  Review of patient's allergies indicates no known allergies.  Home Medications   Prior to Admission medications   Medication Sig Start Date End Date Taking? Authorizing Provider  Chlorpheniramine-Phenylephrine (SUDAFED PE SINUS/ALLERGY PO) Take 1 tablet by mouth 2 (two) times daily as needed (congestion and cough).    Historical Provider, MD  Phenyleph-CPM-DM-APAP (ALKA-SELTZER PLUS COLD & FLU PO) Take 1 tablet by mouth 2 (two) times daily as needed (cough).    Historical  Provider, MD  predniSONE (DELTASONE) 10 MG tablet Take 2 tablets (20 mg total) by mouth 2 (two) times daily. 07/28/13   Arie Sabinaatherine E Schinlever, PA-C   BP 114/69  Pulse 61  Temp(Src) 97.5 F (36.4 C) (Oral)  Resp 16  Ht 5\' 4"  (1.626 m)  Wt 168 lb (76.204 kg)  BMI 28.82 kg/m2  SpO2 99%  LMP 10/15/2013 Physical Exam  Nursing note and vitals reviewed. Constitutional: She is oriented to person, place, and time. She appears well-developed and well-nourished.  HENT:  Head: Normocephalic and atraumatic.  Cardiovascular: Normal rate.   Pulmonary/Chest: Effort normal.  Abdominal: She exhibits no distension.  Musculoskeletal: She exhibits tenderness.  Right trapezius and lateral cervical tenderness to palpation. Pain with external rotation and abduction of right shoulder.   Neurological: She is alert and oriented to person, place, and time.  Skin: Skin is warm and dry.  Psychiatric: She has a normal mood and affect.    ED Course  Procedures (including critical care time)  DIAGNOSTIC STUDIES: Oxygen Saturation is 99% on RA, normal by my interpretation.    COORDINATION OF CARE: 1:18 PM Discussed treatment plan with pt at bedside and pt agreed to plan.   Labs Review Labs Reviewed - No data to display  Imaging Review Dg Shoulder Right  10/27/2013   CLINICAL DATA:  Pain along the top of the shoulder. No known injury.  EXAM: RIGHT SHOULDER - 2+  VIEW  COMPARISON:  Chest x-ray on 07/28/2013  FINDINGS: There is no evidence of fracture or dislocation. There is no evidence of arthropathy or other focal bone abnormality. Soft tissues are unremarkable.  IMPRESSION: Negative.   Electronically Signed   By: Rosalie Gums M.D.   On: 10/27/2013 12:43     EKG Interpretation None      MDM   Final diagnoses:  Strain of right trapezius muscle, initial encounter    1:22 PM Patient has a muscle strain of the right trapezius. Xray unremarkable. Vitals stable and patient afebrile. Patient instructed  to apply heat and take vicodin and flexeril for pain. No neurovascular compromise.   I personally performed the services described in this documentation, which was scribed in my presence. The recorded information has been reviewed and is accurate.   Emilia Beck, PA-C 10/27/13 1323

## 2013-10-27 NOTE — ED Notes (Signed)
Declined W/C at D/C and was escorted to lobby by RN. 

## 2013-10-27 NOTE — ED Notes (Signed)
PT reports taking Alive with out relief.

## 2013-11-05 NOTE — ED Provider Notes (Signed)
Medical screening examination/treatment/procedure(s) were performed by non-physician practitioner and as supervising physician I was immediately available for consultation/collaboration.   EKG Interpretation None        Rolland PorterMark Jesly Hartmann, MD 11/05/13 1022

## 2014-01-13 ENCOUNTER — Encounter (HOSPITAL_COMMUNITY): Payer: Self-pay | Admitting: Emergency Medicine

## 2014-01-13 ENCOUNTER — Emergency Department (HOSPITAL_COMMUNITY)
Admission: EM | Admit: 2014-01-13 | Discharge: 2014-01-13 | Disposition: A | Discharge status: Home / Self Care | Payer: BC Managed Care – PPO | Attending: Emergency Medicine | Admitting: Emergency Medicine

## 2014-01-13 DIAGNOSIS — Z87891 Personal history of nicotine dependence: Secondary | ICD-10-CM | POA: Insufficient documentation

## 2014-01-13 DIAGNOSIS — R0602 Shortness of breath: Secondary | ICD-10-CM | POA: Insufficient documentation

## 2014-01-13 DIAGNOSIS — J45901 Unspecified asthma with (acute) exacerbation: Secondary | ICD-10-CM | POA: Insufficient documentation

## 2014-01-13 MED ORDER — IPRATROPIUM-ALBUTEROL 0.5-2.5 (3) MG/3ML IN SOLN
RESPIRATORY_TRACT | Status: AC
  Administered 2014-01-13: 3 mL via RESPIRATORY_TRACT
  Filled 2014-01-13: qty 3

## 2014-01-13 MED ORDER — PREDNISONE 20 MG PO TABS
60.0000 mg | ORAL_TABLET | Freq: Once | ORAL | Status: AC
  Administered 2014-01-13: 60 mg via ORAL
  Filled 2014-01-13: qty 3

## 2014-01-13 MED ORDER — ALBUTEROL SULFATE HFA 108 (90 BASE) MCG/ACT IN AERS: 1.0000 | INHALATION_SPRAY | Freq: Four times a day (QID) | RESPIRATORY_TRACT | Status: DC | PRN

## 2014-01-13 MED ORDER — PREDNISONE 20 MG PO TABS: 60.0000 mg | ORAL_TABLET | Freq: Every day | ORAL | Status: DC

## 2014-01-13 MED ORDER — IPRATROPIUM-ALBUTEROL 0.5-2.5 (3) MG/3ML IN SOLN
3.0000 mL | Freq: Once | RESPIRATORY_TRACT | Status: AC
  Administered 2014-01-13: 3 mL via RESPIRATORY_TRACT
  Filled 2014-01-13: qty 3

## 2014-01-13 MED ORDER — IPRATROPIUM-ALBUTEROL 0.5-2.5 (3) MG/3ML IN SOLN
3.0000 mL | Freq: Once | RESPIRATORY_TRACT | Status: AC
  Administered 2014-01-13: 3 mL via RESPIRATORY_TRACT

## 2014-01-13 MED ORDER — ALBUTEROL SULFATE HFA 108 (90 BASE) MCG/ACT IN AERS
1.0000 | INHALATION_SPRAY | RESPIRATORY_TRACT | Status: DC | PRN
  Administered 2014-01-13: 2 via RESPIRATORY_TRACT
  Filled 2014-01-13: qty 6.7

## 2014-01-13 NOTE — Discharge Instructions (Signed)
Take medications as prescribed.  Avoid irritants such as smoke that will trigger further exacerbations.  Return to the emergency room for worsening condition or new concerning symptoms.   Asthma Asthma is a recurring condition in which the airways tighten and narrow. Asthma can make it difficult to breathe. It can cause coughing, wheezing, and shortness of breath. Asthma episodes, also called asthma attacks, range from minor to life-threatening. Asthma cannot be cured, but medicines and lifestyle changes can help control it. CAUSES Asthma is believed to be caused by inherited (genetic) and environmental factors, but its exact cause is unknown. Asthma may be triggered by allergens, lung infections, or irritants in the air. Asthma triggers are different for each person. Common triggers include:   Animal dander.  Dust mites.  Cockroaches.  Pollen from trees or grass.  Mold.  Smoke.  Air pollutants such as dust, household cleaners, hair sprays, aerosol sprays, paint fumes, strong chemicals, or strong odors.  Cold air, weather changes, and winds (which increase molds and pollens in the air).  Strong emotional expressions such as crying or laughing hard.  Stress.  Certain medicines (such as aspirin) or types of drugs (such as beta-blockers).  Sulfites in foods and drinks. Foods and drinks that may contain sulfites include dried fruit, potato chips, and sparkling grape juice.  Infections or inflammatory conditions such as the flu, a cold, or an inflammation of the nasal membranes (rhinitis).  Gastroesophageal reflux disease (GERD).  Exercise or strenuous activity. SYMPTOMS Symptoms may occur immediately after asthma is triggered or many hours later. Symptoms include:  Wheezing.  Excessive nighttime or early morning coughing.  Frequent or severe coughing with a common cold.  Chest tightness.  Shortness of breath. DIAGNOSIS  The diagnosis of asthma is made by a review of your  medical history and a physical exam. Tests may also be performed. These may include:  Lung function studies. These tests show how much air you breathe in and out.  Allergy tests.  Imaging tests such as X-rays. TREATMENT  Asthma cannot be cured, but it can usually be controlled. Treatment involves identifying and avoiding your asthma triggers. It also involves medicines. There are 2 classes of medicine used for asthma treatment:   Controller medicines. These prevent asthma symptoms from occurring. They are usually taken every day.  Reliever or rescue medicines. These quickly relieve asthma symptoms. They are used as needed and provide short-term relief. Your health care provider will help you create an asthma action plan. An asthma action plan is a written plan for managing and treating your asthma attacks. It includes a list of your asthma triggers and how they may be avoided. It also includes information on when medicines should be taken and when their dosage should be changed. An action plan may also involve the use of a device called a peak flow meter. A peak flow meter measures how well the lungs are working. It helps you monitor your condition. HOME CARE INSTRUCTIONS   Take medicines only as directed by your health care provider. Speak with your health care provider if you have questions about how or when to take the medicines.  Use a peak flow meter as directed by your health care provider. Record and keep track of readings.  Understand and use the action plan to help minimize or stop an asthma attack without needing to seek medical care.  Control your home environment in the following ways to help prevent asthma attacks:  Do not smoke. Avoid being exposed  to secondhand smoke.  Change your heating and air conditioning filter regularly.  Limit your use of fireplaces and wood stoves.  Get rid of pests (such as roaches and mice) and their droppings.  Throw away plants if you see  mold on them.  Clean your floors and dust regularly. Use unscented cleaning products.  Try to have someone else vacuum for you regularly. Stay out of rooms while they are being vacuumed and for a short while afterward. If you vacuum, use a dust mask from a hardware store, a double-layered or microfilter vacuum cleaner bag, or a vacuum cleaner with a HEPA filter.  Replace carpet with wood, tile, or vinyl flooring. Carpet can trap dander and dust.  Use allergy-proof pillows, mattress covers, and box spring covers.  Wash bed sheets and blankets every week in hot water and dry them in a dryer.  Use blankets that are made of polyester or cotton.  Clean bathrooms and kitchens with bleach. If possible, have someone repaint the walls in these rooms with mold-resistant paint. Keep out of the rooms that are being cleaned and painted.  Wash hands frequently. SEEK MEDICAL CARE IF:   You have wheezing, shortness of breath, or a cough even if taking medicine to prevent attacks.  The colored mucus you cough up (sputum) is thicker than usual.  Your sputum changes from clear or white to yellow, green, gray, or bloody.  You have any problems that may be related to the medicines you are taking (such as a rash, itching, swelling, or trouble breathing).  You are using a reliever medicine more than 2-3 times per week.  Your peak flow is still at 50-79% of your personal best after following your action plan for 1 hour.  You have a fever. SEEK IMMEDIATE MEDICAL CARE IF:   You seem to be getting worse and are unresponsive to treatment during an asthma attack.  You are short of breath even at rest.  You get short of breath when doing very little physical activity.  You have difficulty eating, drinking, or talking due to asthma symptoms.  You develop chest pain.  You develop a fast heartbeat.  You have a bluish color to your lips or fingernails.  You are light-headed, dizzy, or faint.  Your  peak flow is less than 50% of your personal best. MAKE SURE YOU:   Understand these instructions.  Will watch your condition.  Will get help right away if you are not doing well or get worse. Document Released: 04/03/2005 Document Revised: 08/18/2013 Document Reviewed: 10/31/2012 [facility] Patient Information 2015 [place], [place]. This information is not intended to replace advice given to you by your health care provider. Make sure you discuss any questions you have with your health care provider.  Asthma Attack Prevention Although there is no way to prevent asthma from starting, you can take steps to control the disease and reduce its symptoms. Learn about your asthma and how to control it. Take an active role to control your asthma by working with your health care provider to create and follow an asthma action plan. An asthma action plan guides you in:  Taking your medicines properly.  Avoiding things that set off your asthma or make your asthma worse (asthma triggers).  Tracking your level of asthma control.  Responding to worsening asthma.  Seeking emergency care when needed. To track your asthma, keep records of your symptoms, check your peak flow number using a handheld device that shows how well air moves  out of your lungs (peak flow meter), and get regular asthma checkups.  WHAT ARE SOME WAYS TO PREVENT AN ASTHMA ATTACK?  Take medicines as directed by your health care provider.  Keep track of your asthma symptoms and level of control.  With your health care provider, write a detailed plan for taking medicines and managing an asthma attack. Then be sure to follow your action plan. Asthma is an ongoing condition that needs regular monitoring and treatment.  Identify and avoid asthma triggers. Many outdoor allergens and irritants (such as pollen, mold, cold air, and air pollution) can trigger asthma attacks. Find out what your asthma triggers are and take steps to avoid  them.  Monitor your breathing. Learn to recognize warning signs of an attack, such as coughing, wheezing, or shortness of breath. Your lung function may decrease before you notice any signs or symptoms, so regularly measure and record your peak airflow with a home peak flow meter.  Identify and treat attacks early. If you act quickly, you are less likely to have a severe attack. You will also need less medicine to control your symptoms. When your peak flow measurements decrease and alert you to an upcoming attack, take your medicine as instructed and immediately stop any activity that may have triggered the attack. If your symptoms do not improve, get medical help.  Pay attention to increasing quick-relief inhaler use. If you find yourself relying on your quick-relief inhaler, your asthma is not under control. See your health care provider about adjusting your treatment. WHAT CAN MAKE MY SYMPTOMS WORSE? A number of common things can set off or make your asthma symptoms worse and cause temporary increased inflammation of your airways. Keep track of your asthma symptoms for several weeks, detailing all the environmental and emotional factors that are linked with your asthma. When you have an asthma attack, go back to your asthma diary to see which factor, or combination of factors, might have contributed to it. Once you know what these factors are, you can take steps to control many of them. If you have allergies and asthma, it is important to take asthma prevention steps at home. Minimizing contact with the substance to which you are allergic will help prevent an asthma attack. Some triggers and ways to avoid these triggers are: Animal Dander:  Some people are allergic to the flakes of skin or dried saliva from animals with fur or feathers.   There is no such thing as a hypoallergenic dog or cat breed. All dogs or cats can cause allergies, even if they don't shed.  Keep these pets out of your  home.  If you are not able to keep a pet outdoors, keep the pet out of your bedroom and other sleeping areas at all times, and keep the door closed.  Remove carpets and furniture covered with cloth from your home. If that is not possible, keep the pet away from fabric-covered furniture and carpets. Dust Mites: Many people with asthma are allergic to dust mites. Dust mites are tiny bugs that are found in every home in mattresses, pillows, carpets, fabric-covered furniture, bedcovers, clothes, stuffed toys, and other fabric-covered items.   Cover your mattress in a special dust-proof cover.  Cover your pillow in a special dust-proof cover, or wash the pillow each week in hot water. Water must be hotter than 130 F (54.4 C) to kill dust mites. Cold or warm water used with detergent and bleach can also be effective.  Wash the sheets  and blankets on your bed each week in hot water.  Try not to sleep or lie on cloth-covered cushions.  Call ahead when traveling and ask for a smoke-free hotel room. Bring your own bedding and pillows in case the hotel only supplies feather pillows and down comforters, which may contain dust mites and cause asthma symptoms.  Remove carpets from your bedroom and those laid on concrete, if you can.  Keep stuffed toys out of the bed, or wash the toys weekly in hot water or cooler water with detergent and bleach. Cockroaches: Many people with asthma are allergic to the droppings and remains of cockroaches.   Keep food and garbage in closed containers. Never leave food out.  Use poison baits, traps, powders, gels, or paste (for example, boric acid).  If a spray is used to kill cockroaches, stay out of the room until the odor goes away. Indoor Mold:  Fix leaky faucets, pipes, or other sources of water that have mold around them.  Clean floors and moldy surfaces with a fungicide or diluted bleach.  Avoid using humidifiers, vaporizers, or swamp coolers. These can  spread molds through the air. Pollen and Outdoor Mold:  When pollen or mold spore counts are high, try to keep your windows closed.  Stay indoors with windows closed from late morning to afternoon. Pollen and some mold spore counts are highest at that time.  Ask your health care provider whether you need to take anti-inflammatory medicine or increase your dose of the medicine before your allergy season starts. Other Irritants to Avoid:  Tobacco smoke is an irritant. If you smoke, ask your health care provider how you can quit. Ask family members to quit smoking, too. Do not allow smoking in your home or car.  If possible, do not use a wood-burning stove, kerosene heater, or fireplace. Minimize exposure to all sources of smoke, including incense, candles, fires, and fireworks.  Try to stay away from strong odors and sprays, such as perfume, talcum powder, hair spray, and paints.  Decrease humidity in your home and use an indoor air cleaning device. Reduce indoor humidity to below 60%. Dehumidifiers or central air conditioners can do this.  Decrease house dust exposure by changing furnace and air cooler filters frequently.  Try to have someone else vacuum for you once or twice a week. Stay out of rooms while they are being vacuumed and for a short while afterward.  If you vacuum, use a dust mask from a hardware store, a double-layered or microfilter vacuum cleaner bag, or a vacuum cleaner with a HEPA filter.  Sulfites in foods and beverages can be irritants. Do not drink beer or wine or eat dried fruit, processed potatoes, or shrimp if they cause asthma symptoms.  Cold air can trigger an asthma attack. Cover your nose and mouth with a scarf on cold or windy days.  Several health conditions can make asthma more difficult to manage, including a runny nose, sinus infections, reflux disease, psychological stress, and sleep apnea. Work with your health care provider to manage these  conditions.  Avoid close contact with people who have a respiratory infection such as a cold or the flu, since your asthma symptoms may get worse if you catch the infection. Wash your hands thoroughly after touching items that may have been handled by people with a respiratory infection.  Get a flu shot every year to protect against the flu virus, which often makes asthma worse for days or weeks. Also get  a pneumonia shot if you have not previously had one. Unlike the flu shot, the pneumonia shot does not need to be given yearly. Medicines:  Talk to your health care provider about whether it is safe for you to take aspirin or non-steroidal anti-inflammatory medicines (NSAIDs). In a small number of people with asthma, aspirin and NSAIDs can cause asthma attacks. These medicines must be avoided by people who have known aspirin-sensitive asthma. It is important that people with aspirin-sensitive asthma read labels of all over-the-counter medicines used to treat pain, colds, coughs, and fever.  Beta-blockers and ACE inhibitors are other medicines you should discuss with your health care provider. HOW CAN I FIND OUT WHAT I AM ALLERGIC TO? Ask your asthma health care provider about allergy skin testing or blood testing (the RAST test) to identify the allergens to which you are sensitive. If you are found to have allergies, the most important thing to do is to try to avoid exposure to any allergens that you are sensitive to as much as possible. Other treatments for allergies, such as medicines and allergy shots (immunotherapy) are available.  CAN I EXERCISE? Follow your health care provider's advice regarding asthma treatment before exercising. It is important to maintain a regular exercise program, but vigorous exercise or exercise in cold, humid, or dry environments can cause asthma attacks, especially for those people who have exercise-induced asthma. Document Released: 03/22/2009 Document Revised:  04/08/2013 Document Reviewed: 10/09/2012 [facility] Patient Information 2015 [place], [place]. This information is not intended to replace advice given to you by your health care provider. Make sure you discuss any questions you have with your health care provider.  Asthma, Acute Bronchospasm Acute bronchospasm caused by asthma is also referred to as an asthma attack. Bronchospasm means your air passages become narrowed. The narrowing is caused by inflammation and tightening of the muscles in the air tubes (bronchi) in your lungs. This can make it hard to breathe or cause you to wheeze and cough. CAUSES Possible triggers are:  Animal dander from the skin, hair, or feathers of animals.  Dust mites contained in house dust.  Cockroaches.  Pollen from trees or grass.  Mold.  Cigarette or tobacco smoke.  Air pollutants such as dust, household cleaners, hair sprays, aerosol sprays, paint fumes, strong chemicals, or strong odors.  Cold air or weather changes. Cold air may trigger inflammation. Winds increase molds and pollens in the air.  Strong emotions such as crying or laughing hard.  Stress.  Certain medicines such as aspirin or beta-blockers.  Sulfites in foods and drinks, such as dried fruits and wine.  Infections or inflammatory conditions, such as a flu, cold, or inflammation of the nasal membranes (rhinitis).  Gastroesophageal reflux disease (GERD). GERD is a condition where stomach acid backs up into your esophagus.  Exercise or strenuous activity. SIGNS AND SYMPTOMS   Wheezing.  Excessive coughing, particularly at night.  Chest tightness.  Shortness of breath. DIAGNOSIS  Your health care provider will ask you about your medical history and perform a physical exam. A chest X-ray or blood testing may be performed to look for other causes of your symptoms or other conditions that may have triggered your asthma attack. TREATMENT  Treatment is aimed at reducing  inflammation and opening up the airways in your lungs. Most asthma attacks are treated with inhaled medicines. These include quick relief or rescue medicines (such as bronchodilators) and controller medicines (such as inhaled corticosteroids). These medicines are sometimes given through an inhaler or  a nebulizer. Systemic steroid medicine taken by mouth or given through an IV tube also can be used to reduce the inflammation when an attack is moderate or severe. Antibiotic medicines are only used if a bacterial infection is present.  HOME CARE INSTRUCTIONS   Rest.  Drink plenty of liquids. This helps the mucus to remain thin and be easily coughed up. Only use caffeine in moderation and do not use alcohol until you have recovered from your illness.  Do not smoke. Avoid being exposed to secondhand smoke.  You play a critical role in keeping yourself in good health. Avoid exposure to things that cause you to wheeze or to have breathing problems.  Keep your medicines up-to-date and available. Carefully follow your health care provider's treatment plan.  Take your medicine exactly as prescribed.  When pollen or pollution is bad, keep windows closed and use an air conditioner or go to places with air conditioning.  Asthma requires careful medical care. See your health care provider for a follow-up as advised. If you are more than [redacted] weeks pregnant and you were prescribed any new medicines, let your obstetrician know about the visit and how you are doing. Follow up with your health care provider as directed.  After you have recovered from your asthma attack, make an appointment with your outpatient doctor to talk about ways to reduce the likelihood of future attacks. If you do not have a doctor who manages your asthma, make an appointment with a primary care doctor to discuss your asthma. SEEK IMMEDIATE MEDICAL CARE IF:   You are getting worse.  You have trouble breathing. If severe, call your local  emergency services (911 in the [place]  You develop chest pain or discomfort.  You are vomiting.  You are not able to keep fluids down.  You are coughing up yellow, green, brown, or bloody sputum.  You have a fever and your symptoms suddenly get worse.  You have trouble swallowing. MAKE SURE YOU:   Understand these instructions.  Will watch your condition.  Will get help right away if you are not doing well or get worse. Document Released: 07/19/2006 Document Revised: 04/08/2013 Document Reviewed: 10/09/2012 [facility] Patient Information 2015 [place]. This information is not intended to replace advice given to you by your health care provider. Make sure you discuss any questions you have with your health care provider.

## 2014-01-13 NOTE — ED Provider Notes (Signed)
CSN: [id number]     Arrival date & time 01/13/14  0436 History   First MD Initiated Contact with Patient 01/13/14 [phone]     Chief Complaint  Patient presents with  . Shortness of Breath  . Wheezing     (Consider location/radiation/quality/duration/timing/severity/associated sxs/prior Treatment) HPI 38 year old female presents to emergency room with complaint of shortness of breath and wheezing.  Patient reports she has history of asthma, has flares every few years.  She does not currently have an inhaler.  She reports over the last 3 weeks she has had increasing shortness of breath and wheezing.  Tonight symptoms worsened after being around coworkers who smoke.  Patient is nonsmoker herself.  She denies any fever or chills, no productive cough. Past Medical History  Diagnosis Date  . Asthma   . Bronchiectasis with acute exacerbation    History reviewed. No pertinent past surgical history. History reviewed. No pertinent family history. History  Substance Use Topics  . Smoking status: Former Games developermoker  . Smokeless tobacco: Not on file  . Alcohol Use: No   OB History   Grav Para Term Preterm Abortions TAB SAB Ect Mult Living                 Review of Systems   See History of Present Illness; otherwise all other systems are reviewed and negative  Allergies  Review of patient's allergies indicates no known allergies.  Home Medications   Prior to Admission medications   Not on File   BP 126/81  Pulse 66  Temp(Src) 98 F (36.7 C) (Oral)  Resp 24  Ht 5\' 4"  (1.626 m)  Wt 135 lb (61.236 kg)  BMI 23.16 kg/m2  SpO2 96%  LMP 01/08/2014 Physical Exam  Nursing note and vitals reviewed. Constitutional: She is oriented to person, place, and time. She appears well-developed and well-nourished. She appears distressed.  HENT:  Head: Normocephalic and atraumatic.  Nose: Nose normal.  Mouth/Throat: Oropharynx is clear and moist.  Eyes: Conjunctivae and EOM are normal. Pupils are  equal, round, and reactive to light.  Neck: Normal range of motion. Neck supple. No JVD present. No tracheal deviation present. No thyromegaly present.  Cardiovascular: Normal rate, regular rhythm, normal heart sounds and intact distal pulses.  Exam reveals no gallop and no friction rub.   No murmur heard. Pulmonary/Chest: No stridor. She is in respiratory distress ( mild). She has wheezes. She has no rales. She exhibits no tenderness.  Abdominal: Soft. Bowel sounds are normal. She exhibits no distension and no mass. There is no tenderness. There is no rebound and no guarding.  Musculoskeletal: Normal range of motion. She exhibits no edema and no tenderness.  Lymphadenopathy:    She has no cervical adenopathy.  Neurological: She is alert and oriented to person, place, and time. She displays normal reflexes. She exhibits normal muscle tone. Coordination normal.  Skin: Skin is warm and dry. No rash noted. No erythema. No pallor.  Psychiatric: She has a normal mood and affect. Her behavior is normal. Judgment and thought content normal.    ED Course  Procedures (including critical care time) Labs Review Labs Reviewed - No data to display  Imaging Review No results found.   EKG Interpretation   Date/Time:  Tuesday January 13 2014 04:47:36 EDT Ventricular Rate:  70 PR Interval:  174 QRS Duration: 90 QT Interval:  398 QTC Calculation: 429 R Axis:   60 Text Interpretation:  Sinus rhythm Atrial premature complex Confirmed by  Lillyan Hitson  MD, Marian Sorrow ([id number]) on 01/13/2014 5:00:18 AM      MDM   Final diagnoses:  Asthma exacerbation   38 year old female with asthma exacerbation.  To receive neb treatment here, steroids.  Olivia Mackie, MD 01/13/14 [phone]

## 2014-01-13 NOTE — ED Notes (Addendum)
Pt states that she has been wheezing x 3 weeks, but it was worse tonight. Pt in NAD, however wheezing is audible without the use of stethoscope. Pt with hx of asthma. Denies using inhaler, as she states that she doesn't have one. Pt ambulatory to room.

## 2014-02-27 ENCOUNTER — Emergency Department (HOSPITAL_COMMUNITY)
Admission: EM | Admit: 2014-02-27 | Discharge: 2014-02-27 | Disposition: A | Discharge status: Home / Self Care | Payer: Medicaid Other | Attending: Emergency Medicine | Admitting: Emergency Medicine

## 2014-02-27 ENCOUNTER — Encounter (HOSPITAL_COMMUNITY): Payer: Self-pay | Admitting: *Deleted

## 2014-02-27 DIAGNOSIS — J45901 Unspecified asthma with (acute) exacerbation: Secondary | ICD-10-CM | POA: Diagnosis not present

## 2014-02-27 DIAGNOSIS — Z87891 Personal history of nicotine dependence: Secondary | ICD-10-CM | POA: Diagnosis not present

## 2014-02-27 DIAGNOSIS — R0789 Other chest pain: Secondary | ICD-10-CM | POA: Insufficient documentation

## 2014-02-27 DIAGNOSIS — R0602 Shortness of breath: Secondary | ICD-10-CM | POA: Diagnosis present

## 2014-02-27 MED ORDER — PREDNISONE (PAK) 10 MG PO TABS: ORAL_TABLET | Freq: Every day | ORAL | Status: DC

## 2014-02-27 MED ORDER — ALBUTEROL SULFATE HFA 108 (90 BASE) MCG/ACT IN AERS
2.0000 | INHALATION_SPRAY | Freq: Once | RESPIRATORY_TRACT | Status: AC
  Administered 2014-02-27: 2 via RESPIRATORY_TRACT
  Filled 2014-02-27: qty 6.7

## 2014-02-27 MED ORDER — IPRATROPIUM-ALBUTEROL 0.5-2.5 (3) MG/3ML IN SOLN
3.0000 mL | Freq: Once | RESPIRATORY_TRACT | Status: AC
  Administered 2014-02-27: 3 mL via RESPIRATORY_TRACT
  Filled 2014-02-27: qty 3

## 2014-02-27 MED ORDER — PREDNISONE 20 MG PO TABS
60.0000 mg | ORAL_TABLET | Freq: Once | ORAL | Status: AC
  Administered 2014-02-27: 60 mg via ORAL
  Filled 2014-02-27: qty 3

## 2014-02-27 NOTE — ED Notes (Signed)
Pt reports hx of asthma, not feeling well x 1 week but lost her inhaler.

## 2014-02-27 NOTE — ED Notes (Signed)
States has been "having trouble" with her asthma x 1 week. Has lost her inhaler. Pt is in NAD.

## 2014-02-27 NOTE — ED Provider Notes (Signed)
CSN: [id number]     Arrival date & time 02/27/14  0921 History  This chart was scribed for non-physician practitioner, Wendy DredgeEmily Keddrick Wyne, PA-C,working with Ward GivensIva L Knapp, MD, by Karle PlumberJennifer Tensley, ED Scribe. This patient was seen in room TR06C/TR06C and the patient's care was started at 9:29 AM.  Chief Complaint  Patient presents with  . Asthma   Patient is a 38 y.o. female presenting with asthma. The history is provided by the patient. No language interpreter was used.  Asthma Associated symptoms include shortness of breath.    HPI Comments:  Wendy Morgan is a 38 y.o. female with PMH of asthma who presents to the Emergency Department complaining of shortness of breath and wheezing secondary to asthma for the past week. She reports associated cough and chest tightness. Cough is productive of white sputum. Pt states she usually has an inhaler but lost it about one month ago. She states she believes the change in the weather has made her breathing worse. Denies fever, chills, rhinorrhea or hemoptysis. Denies leg swelling. She denies any asthma related hospitalizations or intubations. Does not currently have a PCP due to insurance reasons but states she is working on getting one.  Past Medical History  Diagnosis Date  . Asthma   . Bronchiectasis with acute exacerbation    History reviewed. No pertinent past surgical history. History reviewed. No pertinent family history. History  Substance Use Topics  . Smoking status: Former Games developermoker  . Smokeless tobacco: Not on file  . Alcohol Use: No   OB History    No data available     Review of Systems  Constitutional: Negative for fever and chills.  HENT: Negative for rhinorrhea.   Respiratory: Positive for chest tightness, shortness of breath and wheezing.   All other systems reviewed and are negative.   Allergies  Review of patient's allergies indicates no known allergies.  Home Medications   Prior to Admission medications   Medication Sig  Start Date End Date Taking? Authorizing Provider  albuterol (PROVENTIL HFA;VENTOLIN HFA) 108 (90 BASE) MCG/ACT inhaler Inhale 1-2 puffs into the lungs every 6 (six) hours as needed for wheezing or shortness of breath. 01/13/14   Olivia Mackielga M Otter, MD  predniSONE (DELTASONE) 20 MG tablet Take 3 tablets (60 mg total) by mouth daily. 01/13/14   Olivia Mackielga M Otter, MD   Triage Vitals: BP 115/76 mmHg  Pulse 84  Temp(Src) 98.1 F (36.7 C) (Oral)  SpO2 96%  LMP 02/03/2014 Physical Exam  Constitutional: She appears well-developed and well-nourished. No distress.  HENT:  Head: Normocephalic and atraumatic.  Eyes: Conjunctivae are normal.  Neck: Normal range of motion. Neck supple.  Cardiovascular: Normal rate and regular rhythm.   Pulmonary/Chest: Effort normal. She has decreased breath sounds (throughout). She has wheezes (diffused expiratory.).  Musculoskeletal: She exhibits no edema.  Neurological: She is alert.  Skin: She is not diaphoretic.  Psychiatric: She has a normal mood and affect. Her behavior is normal.  Nursing note and vitals reviewed.   ED Course  Procedures (including critical care time) DIAGNOSTIC STUDIES: Oxygen Saturation is 96% on RA, normal by my interpretation.   COORDINATION OF CARE: 9:34 AM- Will order breathing treatment and Prednisone. Pt verbalizes understanding and agrees to plan.  Medications  predniSONE (DELTASONE) tablet 60 mg (60 mg Oral Given 02/27/14 0937)  ipratropium-albuterol (DUONEB) 0.5-2.5 (3) MG/3ML nebulizer solution 3 mL (3 mLs Nebulization Given 02/27/14 0938)  albuterol (PROVENTIL HFA;VENTOLIN HFA) 108 (90 BASE) MCG/ACT inhaler 2 puff (  2 puffs Inhalation Given 02/27/14 1005)    Labs Review Labs Reviewed - No data to display  Imaging Review No results found.   EKG Interpretation None      10:07 AM Pt reports great improvement after neb treatment.  She has decreased, only occasional wheezing, and is moving air well in all fields.  Declines  repeat neb treatment.  Feels well ambulating.    MDM   Final diagnoses:  Asthma exacerbation    Afebrile, nontoxic patient with asthma exacerbation, improved with neb treatments, steroids.   D/C home with prednisone, albuterol, PCP resources for follow up.  Discussed result, findings, treatment, and follow up  with patient.  Pt given return precautions.  Pt verbalizes understanding and agrees with plan.      I doubt any other EMC precluding discharge at this time including, but not necessarily limited to the following: pneumonia, pulmonary embolism.  I personally performed the services described in this documentation, which was scribed in my presence. The recorded information has been reviewed and is accurate.    Wendy Dredgemily Anntonette Madewell, PA-C 02/27/14 1010  Ward GivensIva L Knapp, MD 02/27/14 [phone]

## 2014-02-27 NOTE — Discharge Instructions (Signed)
Read the information below.  Use the prescribed medication as directed.  Please discuss all new medications with your pharmacist.  You may return to the Emergency Department at any time for worsening condition or any new symptoms that concern you.   If you develop worsening shortness of breath, uncontrolled wheezing, severe chest pain, or fevers despite using tylenol and/or ibuprofen, return for a recheck.    ° ° °Asthma °Asthma is a recurring condition in which the airways tighten and narrow. Asthma can make it difficult to breathe. It can cause coughing, wheezing, and shortness of breath. Asthma episodes, also called asthma attacks, range from minor to life-threatening. Asthma cannot be cured, but medicines and lifestyle changes can help control it. °CAUSES °Asthma is believed to be caused by inherited (genetic) and environmental factors, but its exact cause is unknown. Asthma may be triggered by allergens, lung infections, or irritants in the air. Asthma triggers are different for each person. Common triggers include:  °· Animal dander. °· Dust mites. °· Cockroaches. °· Pollen from trees or grass. °· Mold. °· Smoke. °· Air pollutants such as dust, household cleaners, hair sprays, aerosol sprays, paint fumes, strong chemicals, or strong odors. °· Cold air, weather changes, and winds (which increase molds and pollens in the air). °· Strong emotional expressions such as crying or laughing hard. °· Stress. °· Certain medicines (such as aspirin) or types of drugs (such as beta-blockers). °· Sulfites in foods and drinks. Foods and drinks that may contain sulfites include dried fruit, potato chips, and sparkling grape juice. °· Infections or inflammatory conditions such as the flu, a cold, or an inflammation of the nasal membranes (rhinitis). °· Gastroesophageal reflux disease (GERD). °· Exercise or strenuous activity. °SYMPTOMS °Symptoms may occur immediately after asthma is triggered or many hours later. Symptoms  include: °· Wheezing. °· Excessive nighttime or early morning coughing. °· Frequent or severe coughing with a common cold. °· Chest tightness. °· Shortness of breath. °DIAGNOSIS  °The diagnosis of asthma is made by a review of your medical history and a physical exam. Tests may also be performed. These may include: °· Lung function studies. These tests show how much air you breathe in and out. °· Allergy tests. °· Imaging tests such as X-rays. °TREATMENT  °Asthma cannot be cured, but it can usually be controlled. Treatment involves identifying and avoiding your asthma triggers. It also involves medicines. There are 2 classes of medicine used for asthma treatment:  °· Controller medicines. These prevent asthma symptoms from occurring. They are usually taken every day. °· Reliever or rescue medicines. These quickly relieve asthma symptoms. They are used as needed and provide short-term relief. °Your health care provider will help you create an asthma action plan. An asthma action plan is a written plan for managing and treating your asthma attacks. It includes a list of your asthma triggers and how they may be avoided. It also includes information on when medicines should be taken and when their dosage should be changed. An action plan may also involve the use of a device called a peak flow meter. A peak flow meter measures how well the lungs are working. It helps you monitor your condition. °HOME CARE INSTRUCTIONS  °· Take medicines only as directed by your health care provider. Speak with your health care provider if you have questions about how or when to take the medicines. °· Use a peak flow meter as directed by your health care provider. Record and keep track of readings. °·   Understand and use the action plan to help minimize or stop an asthma attack without needing to seek medical care. °· Control your home environment in the following ways to help prevent asthma attacks: °¨ Do not smoke. Avoid being exposed to  secondhand smoke. °¨ Change your heating and air conditioning filter regularly. °¨ Limit your use of fireplaces and wood stoves. °¨ Get rid of pests (such as roaches and mice) and their droppings. °¨ Throw away plants if you see mold on them. °¨ Clean your floors and dust regularly. Use unscented cleaning products. °¨ Try to have someone else vacuum for you regularly. Stay out of rooms while they are being vacuumed and for a short while afterward. If you vacuum, use a dust mask from a hardware store, a double-layered or microfilter vacuum cleaner bag, or a vacuum cleaner with a HEPA filter. °¨ Replace carpet with wood, tile, or vinyl flooring. Carpet can trap dander and dust. °¨ Use allergy-proof pillows, mattress covers, and box spring covers. °¨ Wash bed sheets and blankets every week in hot water and dry them in a dryer. °¨ Use blankets that are made of polyester or cotton. °¨ Clean bathrooms and kitchens with bleach. If possible, have someone repaint the walls in these rooms with mold-resistant paint. Keep out of the rooms that are being cleaned and painted. °¨ Wash hands frequently. °SEEK MEDICAL CARE IF:  °· You have wheezing, shortness of breath, or a cough even if taking medicine to prevent attacks. °· The colored mucus you cough up (sputum) is thicker than usual. °· Your sputum changes from clear or white to yellow, green, gray, or bloody. °· You have any problems that may be related to the medicines you are taking (such as a rash, itching, swelling, or trouble breathing). °· You are using a reliever medicine more than 2-3 times per week. °· Your peak flow is still at 50-79% of your personal best after following your action plan for 1 hour. °· You have a fever. °SEEK IMMEDIATE MEDICAL CARE IF:  °· You seem to be getting worse and are unresponsive to treatment during an asthma attack. °· You are short of breath even at rest. °· You get short of breath when doing very little physical activity. °· You have  difficulty eating, drinking, or talking due to asthma symptoms. °· You develop chest pain. °· You develop a fast heartbeat. °· You have a bluish color to your lips or fingernails. °· You are light-headed, dizzy, or faint. °· Your peak flow is less than 50% of your personal best. °MAKE SURE YOU:  °· Understand these instructions. °· Will watch your condition. °· Will get help right away if you are not doing well or get worse. °Document Released: 04/03/2005 Document Revised: 08/18/2013 Document Reviewed: 10/31/2012 °ExitCare® Patient Information ©2015 [employer]. This information is not intended to replace advice given to you by your health care provider. Make sure you discuss any questions you have with your health care provider. ° ° °Emergency Department Resource Guide °1) Find a Doctor and Pay Out of Pocket °Although you won't have to find out who is covered by your insurance plan, it is a good idea to ask around and get recommendations. You will then need to call the office and see if the doctor you have chosen will accept you as a new patient and what types of options they offer for patients who are self-pay. Some doctors offer discounts or will set up payment plans for   their patients who do not have insurance, but you will need to ask so you aren't surprised when you get to your appointment. ° °2) Contact Your Local Health Department °Not all health departments have doctors that can see patients for sick visits, but many do, so it is worth a call to see if yours does. If you don't know where your local health department is, you can check in your phone book. The CDC also has a tool to help you locate your state's health department, and many state websites also have listings of all of their local health departments. ° °3) Find a Walk-in Clinic °If your illness is not likely to be very severe or complicated, you may want to try a walk in clinic. These are popping up all over the country in pharmacies,  drugstores, and shopping centers. They're usually staffed by nurse practitioners or physician assistants that have been trained to treat common illnesses and complaints. They're usually fairly quick and inexpensive. However, if you have serious medical issues or chronic medical problems, these are probably not your best option. ° °No Primary Care Doctor: °- Call [facility] at  832-8000 - they can help you locate a primary care doctor that  accepts your insurance, provides certain services, etc. °- Physician Referral Service- 1-800-533-3463 ° °Chronic Pain Problems: °Organization         Address  Phone   Notes  °[facility]  (336) 297-2271 Patients need to be referred by their primary care doctor.  ° °Medication Assistance: °Organization         Address  Phone   Notes  °[place] [facility] [address] °[place], [place] [address] (336) 641-8030 --Must be a resident of [place] °-- Must have NO insurance coverage whatsoever (no Medicaid/ Medicare, etc.) °-- The pt. MUST have a primary care doctor that directs their care regularly and follows them in the community °  °[facility]  (866) 331-1348   °[employer]  (888) 892-1162   ° °Agencies that provide inexpensive medical care: °Organization         Address  Phone   Notes  °[facility]  (336) 832-8035   °[facility]    (336) 832-7272   °[facility] [address] °[place], [place] [address] (336) 832-4777   °[facility] [address], °[place] (336) 271-4999   °[facility]    (336) 373-0678   °[facility]    (336) 272-1050   °[facility] ° [address], [place] Phone:  (336) 832-4444, Fax:  (336) 832-4440 Hours of Operation:  9 am - 6 pm, M-F.  Also accepts Medicaid/Medicare and self-pay.  °[facility] ° [address], [place] Phone:  (336) 832-3150, Fax: (336) [phone]. Hours of Operation:  8:30 am - 5:30 pm, M-F.  Also accepts Medicaid and self-pay.  °[facility] [place] [address], [place] Phone: (336) 878-6027   °[facility] [address], [place], [place] (336)723-1848, Ext. 123 Mondays & Thursdays: 7-9 AM.  First 15 patients are seen on a first come, first serve basis. °  ° °Medicaid-accepting [place] Providers: ° °Organization         Address  Phone   Notes  °[facility] [address], [place] (336) 641-2100 Also accepts self-pay patients.  °  [facility] [address], [place] ° (336) 856-9996   °[facility] [address] [place] [address], [place] (336) 288-8857   °[facility] [address] [place] [address], [place] (336) 299-7000   °Veita Bland [address], [place]  ° (336) 373-1557 Only accepts [place] Medicaid patients after they have their name applied to their card.  ° °Self-Pay (no insurance) in [place]: ° °Organization         Address  Phone   Notes  °Sickle Cell Patients, [facility] [address], [place] (336) 832-1970   °[facility] [address], [place] (336) 832-4400   °[facility] ° [address], [place] (336) 992-4800   °[facility]/Dr. Osei-Bonsu ° [address] [place] [address], [place] or [address], [place] (336) [phone] Phone number for both [place] and [place] locations is the same.  °[facility] [address], [place] (336) 299-0000   °[facility] [address] [place] [address], [place] or [address] (336) 852-7530 °(336) 878-2260   °[facility] [address], [place] (336) 350-1642, phone; (336) 294-5005, fax Sees patients 1st and 3rd Saturday of every month.  Must not qualify for public  or private insurance (i.e. Medicaid, Medicare, [place] Health Choice, Veterans' Benefits) • Household income should be no more than 200% of the poverty level •The clinic cannot treat you if you are pregnant or think you are pregnant • Sexually transmitted diseases are not treated at the clinic.  ° ° °Dental Care: °Organization         Address  Phone  Notes  °[facility] [address], [place] (336) 641-6152 Accepts children up to age 21 who are enrolled in Medicaid or [place] Health Choice; pregnant women with a Medicaid card; and children who have applied for Medicaid or [place] Health Choice, but were declined, whose parents can pay a reduced fee at time of service.  °[facility] [place]  [address], [place] (336) 641-7733 Accepts children up to age 21 who are enrolled in Medicaid or [place] Health Choice; pregnant women with a Medicaid card; and children who have applied for Medicaid or [place] Health Choice, but were declined, whose parents can pay a reduced fee at time of service.  °[facility] ° [address],  (336) 641-4533 Patients are seen by appointment only. Walk-ins are not accepted. [facility] will see patients 18 years of age and older. °Monday - Tuesday (8am-5pm) °Most Wednesdays (8:30-5pm) °$30 per visit, cash only  °[facility] ° [address], [place] (336) 641-4533 Patients are seen by appointment only. Walk-ins are not accepted. [facility] will see patients 18 years of age and older. °One Wednesday Evening (Monthly: Volunteer Based).  $30 per visit, cash only  °[facility]  (919) 537-3737 for adults; Children under age 4, call Graduate Pediatric Dentistry at ([phone]) [phone]. Children aged 4-14, please call ([phone]) [phone] to request a pediatric application. ° Dental services are provided in all areas of  dental care including fillings, crowns and bridges, complete and partial dentures, implants, gum treatment, root canals, and extractions. Preventive care is also provided. Treatment is provided to both adults   and children. °Patients are selected via a lottery and there is often a waiting list. °  °[facility] [address], °[place] ° (336) 763-8833 [email or web address] °  °[facility] [address], [place], [place] (336)723-1848, Ext. 123 Second and Fourth Thursday of each month, opens at 6:30 AM; Clinic ends at 9 AM.  Patients are seen on a first-come first-served basis, and a limited number are seen during each clinic.  ° °[facility] ° [address], [place], [place] (336) 723-7904   Eligibility Requirements °You must have lived in [place], [place], or [place] counties for at least the last three months. °  You cannot be eligible for state or federal sponsored healthcare insurance, including Veterans Administration, Medicaid, or Medicare. °  You generally cannot be eligible for healthcare insurance through your employer.  °  How to apply: °Eligibility screenings are held every Tuesday and Wednesday afternoon from 1:00 pm until 4:00 pm. You do not need an appointment for the interview!  °[facility] [address], [place], [place] 336-631-2330   °[facility]  336-342-8273   °[facility]  336-703-3100   °[facility]  336-570-6415   ° °Behavioral Health Resources in the Community: °Intensive Outpatient Programs °Organization         Address  Phone  Notes  °[facility] [address], [place], [place] 336-878-6098   °[facility] [address], [place], [place] 336-832-9800   °[facility] [address], [place], [place] ° 336-882-2125   °[facility] [address],  °[place], [place] 1-800-853-5163 or  336-641-4981   °Substance Abuse Resources °Organization         Address  Phone  Notes  °[facility]  336-882-2125   °[facility]  336-784-9470   °[facility]  336-285-9073   °[facility]  336-845-3988   °Residential & Outpatient Substance Abuse Program  1-800-659-3381   °Psychological Services °Organization         Address  Phone  Notes  °[facility]  336- 832-9600   °[facility]  336- 378-7881   °[facility] [address], [place] 1-800-853-5163 or 336-641-4981   ° °Mobile Crisis Teams °Organization         Address  Phone  Notes  °[facility], Mobile Crisis Care Unit  1-877-626-1772   °Assertive °[facility] ° [address] [place], [place] 336-834-9664   °Sharon DeEsch [address] °[place] 336-554-5454   ° °Self-Help/Support Groups °Organization         Address  Phone             Notes  °[facility] - variety of support groups  336- 373-1402 Call for more information  °Narcotics Anonymous (NA), [facility] [address], °[place]  2 meetings at this location  ° °Residential Treatment Programs °Organization         Address  Phone  Notes  °[facility] [address],    °[place]  1-866-801-8205   °[facility] ° [address], [place], [place] 704-293-8524   °[facility] [address], [place] 336-845-3988 Admissions: 8am-3pm M-F  °[facility] [address]    °[place], [place] 336-841-1104   °[facility] [address], [place], [place] 336-379-7146   °[facility] [address]  °[place], [place] 336-285-9073   °[facility] Intensive Outpatient [address], [place], [place] 336-852-3033   °[facility] [address]  °[place], [place] 1-877-615-2722 or 336-784-9470   °[facility] [address] [place], [place] 336-227-7417 Accepts Medicaid  °[facility] [address]  °[place] 1-800-659-3381 Substance Abuse/Addiction Treatment  ° °[place] Behavioral Health Resources °Organization         Address  Phone  Notes  °[facility]  (888) 581-9988   °Julie Brannon, PhD [address] [place], [place]   (336) 349-5553 or (336) 951-0000   °[facility]   [address] °[place], [place] (336) 349-4454   °[facility] [address], [place], [place] (336) 342-8316 Insurance/Medicaid/sponsorship through [facility]  °[facility] [address]                                    [place], [place] (336) 342-8316 Therapy/tele-psych/case  °[facility] [address]  ° [place], [place] (336) 349-2233    °Dr. Arfeen  (336) 349-4544   °[facility] [place]  [employer] [place] [facility] 1) [address], [place] °2) [address], [place] °3)  [address], [place] (336) 349-3220 °(336) 342-7768 ° °(336) 342-8140   °[place] [facility] (336) 342-1394 or (336) 342-3537 (After Hours)    ° ° ° ° °

## 2014-05-05 ENCOUNTER — Emergency Department (HOSPITAL_COMMUNITY)
Admission: EM | Admit: 2014-05-05 | Discharge: 2014-05-05 | Disposition: A | Discharge status: Home / Self Care | Payer: Medicaid Other | Attending: Emergency Medicine | Admitting: Emergency Medicine

## 2014-05-05 ENCOUNTER — Encounter (HOSPITAL_COMMUNITY): Payer: Self-pay | Admitting: Physical Medicine and Rehabilitation

## 2014-05-05 DIAGNOSIS — R0789 Other chest pain: Secondary | ICD-10-CM | POA: Insufficient documentation

## 2014-05-05 DIAGNOSIS — J45901 Unspecified asthma with (acute) exacerbation: Secondary | ICD-10-CM | POA: Diagnosis not present

## 2014-05-05 DIAGNOSIS — Z7952 Long term (current) use of systemic steroids: Secondary | ICD-10-CM | POA: Insufficient documentation

## 2014-05-05 DIAGNOSIS — Z79899 Other long term (current) drug therapy: Secondary | ICD-10-CM | POA: Insufficient documentation

## 2014-05-05 DIAGNOSIS — J45909 Unspecified asthma, uncomplicated: Secondary | ICD-10-CM | POA: Diagnosis present

## 2014-05-05 DIAGNOSIS — Z87891 Personal history of nicotine dependence: Secondary | ICD-10-CM | POA: Diagnosis not present

## 2014-05-05 MED ORDER — IPRATROPIUM-ALBUTEROL 0.5-2.5 (3) MG/3ML IN SOLN
3.0000 mL | Freq: Once | RESPIRATORY_TRACT | Status: AC
  Administered 2014-05-05: 3 mL via RESPIRATORY_TRACT
  Filled 2014-05-05: qty 3

## 2014-05-05 MED ORDER — ALBUTEROL SULFATE (2.5 MG/3ML) 0.083% IN NEBU
5.0000 mg | INHALATION_SOLUTION | Freq: Once | RESPIRATORY_TRACT | Status: AC
  Administered 2014-05-05: 5 mg via RESPIRATORY_TRACT
  Filled 2014-05-05: qty 6

## 2014-05-05 MED ORDER — PREDNISONE 20 MG PO TABS
60.0000 mg | ORAL_TABLET | Freq: Once | ORAL | Status: AC
  Administered 2014-05-05: 60 mg via ORAL
  Filled 2014-05-05: qty 3

## 2014-05-05 MED ORDER — ALBUTEROL SULFATE HFA 108 (90 BASE) MCG/ACT IN AERS: 1.0000 | INHALATION_SPRAY | Freq: Four times a day (QID) | RESPIRATORY_TRACT | Status: DC | PRN

## 2014-05-05 MED ORDER — ALBUTEROL SULFATE HFA 108 (90 BASE) MCG/ACT IN AERS
2.0000 | INHALATION_SPRAY | Freq: Once | RESPIRATORY_TRACT | Status: AC
  Administered 2014-05-05: 2 via RESPIRATORY_TRACT
  Filled 2014-05-05: qty 6.7

## 2014-05-05 MED ORDER — PREDNISONE 20 MG PO TABS: 40.0000 mg | ORAL_TABLET | Freq: Every day | ORAL | Status: DC

## 2014-05-05 NOTE — ED Notes (Signed)
Pt here for "asthma attack". Audible wheezing. States has been out of her inhaler for 1 month. Pt was here in Nov for same.

## 2014-05-05 NOTE — ED Provider Notes (Signed)
CSN: [id number]     Arrival date & time 05/05/14  1409 History  This chart was scribed for non-physician practitioner, Emilia BeckKaitlyn Promise Bushong, PA-C, working with Toy BakerAnthony T Allen, MD by Charline BillsEssence Howell, ED Scribe. This patient was seen in room TR08C/TR08C and the patient's care was started at 2:55 PM.    Chief Complaint  Patient presents with  . Asthma   The history is provided by the patient. No language interpreter was used.   HPI Comments: Wendy Morgan is a 39 y.o. female, with a h/o asthma, who presents to the Emergency Department complaining of gradually worsening wheezing over the past month, worse today. Pt reports associated chest tightness, cough, SOB. Pt tried to go to urgent care but states that they were closed so she came here instead. Pt states that she ran out of her inhaler a month ago and recently got a new PCP.  [facility]   Past Medical History  Diagnosis Date  . Asthma   . Bronchiectasis with acute exacerbation    History reviewed. No pertinent past surgical history. No family history on file. History  Substance Use Topics  . Smoking status: Former Games developermoker  . Smokeless tobacco: Not on file  . Alcohol Use: No   OB History    No data available     Review of Systems  Respiratory: Positive for cough, chest tightness, shortness of breath and wheezing.   All other systems reviewed and are negative.  Allergies  Review of patient's allergies indicates no known allergies.  Home Medications   Prior to Admission medications   Medication Sig Start Date End Date Taking? Authorizing Provider  albuterol (PROVENTIL HFA;VENTOLIN HFA) 108 (90 BASE) MCG/ACT inhaler Inhale 1-2 puffs into the lungs every 6 (six) hours as needed for wheezing or shortness of breath. 01/13/14   Olivia Mackielga M Otter, MD  predniSONE (DELTASONE) 20 MG tablet Take 3 tablets (60 mg total) by mouth daily. 01/13/14   Olivia Mackielga M Otter, MD  predniSONE (STERAPRED UNI-PAK) 10 MG tablet Take by mouth  daily. Day 1: take 6 tabs.  Day 2: 5 tabs  Day 3: 4 tabs  Day 4: 3 tabs  Day 5: 2 tabs  Day 6: 1 tab 02/27/14   Trixie DredgeEmily West, PA-C   Triage Vitals: BP 129/76 mmHg  Pulse 67  Temp(Src) 98.1 F (36.7 C) (Oral)  Resp 20  Ht 5\' 4"  (1.626 m)  Wt 140 lb (63.504 kg)  BMI 24.02 kg/m2  SpO2 97% Physical Exam  Constitutional: She is oriented to person, place, and time. She appears well-developed and well-nourished. No distress.  HENT:  Head: Normocephalic and atraumatic.  Eyes: Conjunctivae and EOM are normal.  Neck: Neck supple.  Cardiovascular: Normal rate, regular rhythm and normal heart sounds.   Pulmonary/Chest: Effort normal. She has wheezes.  Expiratory wheezing in all lung fields  Abdominal: Soft. She exhibits no distension. There is no tenderness.  Musculoskeletal: Normal range of motion.  Neurological: She is alert and oriented to person, place, and time.  Skin: Skin is warm and dry.  Psychiatric: She has a normal mood and affect. Her behavior is normal.  Nursing note and vitals reviewed.  ED Course  Procedures (including critical care time) DIAGNOSTIC STUDIES: Oxygen Saturation is 97% on RA, normal by my interpretation.    COORDINATION OF CARE: 2:58 PM-Discussed treatment plan which includes 2 nebulizer treatments, albuterol inhaler and Prednisone with pt at bedside and pt agreed to plan.   Labs Review Labs  Reviewed - No data to display  Imaging Review No results found.   EKG Interpretation None      MDM   Final diagnoses:  Asthma exacerbation    3:33 PM Patient's lung sounds improved with albuterol nebulizer. Patient will be discharged with rescue inhaler and prednisone for asthma exacerbation. Vitals stable and patient afebrile.   I personally performed the services described in this documentation, which was scribed in my presence. The recorded information has been reviewed and is accurate.    Emilia Beck, PA-C 05/05/14 1537  Toy Baker,  MD 05/06/14 1001

## 2014-05-05 NOTE — Discharge Instructions (Signed)
Take Prednisone as directed until gone. Use albuterol inhaler as needed. Follow up with your doctor for further evaluation. Return to the ED with worsening or concerning symptoms.

## 2014-05-05 NOTE — ED Notes (Addendum)
Pt presents to department for evaluation of asthma exacerbation. States she ran out of inhaler several weeks ago. Expiratory wheezing and dry cough upon arrival to ED. Respirations unlabored. Speaking complete sentences. Pt is alert and oriented x4. NAD.

## 2014-06-01 ENCOUNTER — Emergency Department (HOSPITAL_COMMUNITY): Payer: Medicaid Other

## 2014-06-01 ENCOUNTER — Encounter (HOSPITAL_COMMUNITY): Payer: Self-pay | Admitting: Emergency Medicine

## 2014-06-01 ENCOUNTER — Emergency Department (HOSPITAL_COMMUNITY)
Admission: EM | Admit: 2014-06-01 | Discharge: 2014-06-01 | Disposition: A | Discharge status: Home / Self Care | Payer: Medicaid Other | Attending: Emergency Medicine | Admitting: Emergency Medicine

## 2014-06-01 DIAGNOSIS — J45901 Unspecified asthma with (acute) exacerbation: Secondary | ICD-10-CM | POA: Diagnosis not present

## 2014-06-01 DIAGNOSIS — J069 Acute upper respiratory infection, unspecified: Secondary | ICD-10-CM

## 2014-06-01 DIAGNOSIS — Z79899 Other long term (current) drug therapy: Secondary | ICD-10-CM | POA: Insufficient documentation

## 2014-06-01 DIAGNOSIS — R059 Cough, unspecified: Secondary | ICD-10-CM

## 2014-06-01 DIAGNOSIS — Z87891 Personal history of nicotine dependence: Secondary | ICD-10-CM | POA: Insufficient documentation

## 2014-06-01 DIAGNOSIS — J45909 Unspecified asthma, uncomplicated: Secondary | ICD-10-CM | POA: Diagnosis present

## 2014-06-01 DIAGNOSIS — R05 Cough: Secondary | ICD-10-CM

## 2014-06-01 MED ORDER — BENZONATATE 100 MG PO CAPS: 200.0000 mg | ORAL_CAPSULE | Freq: Two times a day (BID) | ORAL | Status: DC | PRN

## 2014-06-01 MED ORDER — PREDNISONE 20 MG PO TABS
60.0000 mg | ORAL_TABLET | Freq: Once | ORAL | Status: AC
  Administered 2014-06-01: 60 mg via ORAL
  Filled 2014-06-01: qty 3

## 2014-06-01 MED ORDER — PREDNISONE 50 MG PO TABS: ORAL_TABLET | ORAL | Status: DC

## 2014-06-01 MED ORDER — IPRATROPIUM-ALBUTEROL 0.5-2.5 (3) MG/3ML IN SOLN
3.0000 mL | Freq: Once | RESPIRATORY_TRACT | Status: AC
  Administered 2014-06-01: 3 mL via RESPIRATORY_TRACT
  Filled 2014-06-01: qty 3

## 2014-06-01 NOTE — ED Notes (Signed)
MD at bedside. 

## 2014-06-01 NOTE — ED Notes (Signed)
Awake. Verbally responsive. A/O x4. Resp even and unlabored. Occ nonproductive moist cough noted. Auscultated exp wheezing in bil lower lobes. ABC's intact.

## 2014-06-01 NOTE — ED Provider Notes (Signed)
CSN: [id number]     Arrival date & time 06/01/14  [id number] History   None    Chief Complaint  Patient presents with  . Asthma     (Consider location/radiation/quality/duration/timing/severity/associated sxs/prior Treatment) HPI   Wendy Morgan is a 39 y.o. female complaining of asthma exacerbation associated with posttussive emesis, rhinorrhea, dry cough onset 2 days ago. Patient denies fever, chills, nausea, vomiting, sick contacts, myalgia. Oozing her albuterol inhaler frequently with little relief.  Past Medical History  Diagnosis Date  . Asthma   . Bronchiectasis with acute exacerbation    History reviewed. No pertinent past surgical history. History reviewed. No pertinent family history. History  Substance Use Topics  . Smoking status: Former Games developermoker  . Smokeless tobacco: Not on file  . Alcohol Use: No   OB History    No data available     Review of Systems  10 systems reviewed and found to be negative, except as noted in the HPI.   Allergies  Review of patient's allergies indicates no known allergies.  Home Medications   Prior to Admission medications   Medication Sig Start Date End Date Taking? Authorizing Provider  albuterol (PROVENTIL HFA;VENTOLIN HFA) 108 (90 BASE) MCG/ACT inhaler Inhale 1-2 puffs into the lungs every 6 (six) hours as needed for wheezing or shortness of breath. 05/05/14  Yes Kaitlyn Szekalski, PA-C  predniSONE (DELTASONE) 20 MG tablet Take 2 tablets (40 mg total) by mouth daily. Take 40 mg by mouth daily for 3 days, then 20mg  by mouth daily for 3 days, then 10mg  daily for 3 days Patient not taking: Reported on 06/01/2014 05/05/14   Kaitlyn Szekalski, PA-C   BP 125/78 mmHg  Pulse 70  Temp(Src) 97.9 F (36.6 C) (Oral)  Resp 18  SpO2 94%  LMP 05/29/2014 (Exact Date) Physical Exam  Constitutional: She is oriented to person, place, and time. She appears well-developed and well-nourished. No distress.  HENT:  Head: Normocephalic.   Mouth/Throat: Oropharynx is clear and moist.  Positive for rhinorrhea  Eyes: Conjunctivae and EOM are normal.  Neck: Normal range of motion.  Cardiovascular: Normal rate, regular rhythm and intact distal pulses.   Pulmonary/Chest: Effort normal. No stridor. No respiratory distress. She has wheezes. She has no rales. She exhibits no tenderness.  Trace scattered expiratory wheezing, conducted upper airway sounds  Musculoskeletal: Normal range of motion.  Neurological: She is alert and oriented to person, place, and time.  Psychiatric: She has a normal mood and affect.  Nursing note and vitals reviewed.   ED Course  Procedures (including critical care time) Labs Review Labs Reviewed - No data to display  Imaging Review No results found.   EKG Interpretation None      MDM   Final diagnoses:  Cough  URI (upper respiratory infection)  Asthma exacerbation    Filed Vitals:   06/01/14 0639 06/01/14 0655 06/01/14 0716 06/01/14 0816  BP: 125/78   127/75  Pulse: 70   66  Temp: 97.9 F (36.6 C)   98.2 F (36.8 C)  TempSrc: Oral   Oral  Resp: 18   18  SpO2: 94% 94% 94% 98%    Medications  predniSONE (DELTASONE) tablet 60 mg (60 mg Oral Given 06/01/14 0711)  ipratropium-albuterol (DUONEB) 0.5-2.5 (3) MG/3ML nebulizer solution 3 mL (3 mLs Nebulization Given 06/01/14 0716)    Wendy Morgan is a pleasant 39 y.o. female presenting with URI and asthma exacerbation. Patient is afebrile and well-appearing, lung sounds with very  mild scattered expiratory wheezing. Chest x-rays without infiltrate, patient saturating well on room air. She has her inhaler at home and does not need a refill. Will start her on prednisone burst and provide Tessalon Perles.  Evaluation does not show pathology that would require ongoing emergent intervention or inpatient treatment. Pt is hemodynamically stable and mentating appropriately. Discussed findings and plan with patient/guardian, who agrees with  care plan. All questions answered. Return precautions discussed and outpatient follow up given.    Wynetta Emery, PA-C 06/01/14 1610  Suzi Roots, MD 06/02/14 [phone]

## 2014-06-01 NOTE — ED Notes (Signed)
RT called and made aware of needing neb tx for pt. Verbalized understanding.

## 2014-06-01 NOTE — ED Notes (Signed)
Pt states she has asthma and has been wheezing for about a week now, worse tonight  Pt states she has an inhaler but it is not helping

## 2014-06-01 NOTE — Discharge Instructions (Signed)
Use nasal saline (you can try Arm and Hammer Simply Saline) at least 4 times a day, use saline 5-10 minutes before using the fluticasone (flonase) nasal spray  Do not use Afrin (Oxymetazoline)  Rest, wash hands frequently  and drink plenty of water.  You may try over the counter medication such as Mucinex or Sudafed decongestant.  Do not hesitate to return to the emergency room for any new, worsening or concerning symptoms.  Please obtain primary care using resource guide below. But the minute you were seen in the emergency room and that they will need to obtain records for further outpatient management.   Asthma, Acute Bronchospasm Acute bronchospasm caused by asthma is also referred to as an asthma attack. Bronchospasm means your air passages become narrowed. The narrowing is caused by inflammation and tightening of the muscles in the air tubes (bronchi) in your lungs. This can make it hard to breathe or cause you to wheeze and cough. CAUSES Possible triggers are:  Animal dander from the skin, hair, or feathers of animals.  Dust mites contained in house dust.  Cockroaches.  Pollen from trees or grass.  Mold.  Cigarette or tobacco smoke.  Air pollutants such as dust, household cleaners, hair sprays, aerosol sprays, paint fumes, strong chemicals, or strong odors.  Cold air or weather changes. Cold air may trigger inflammation. Winds increase molds and pollens in the air.  Strong emotions such as crying or laughing hard.  Stress.  Certain medicines such as aspirin or beta-blockers.  Sulfites in foods and drinks, such as dried fruits and wine.  Infections or inflammatory conditions, such as a flu, cold, or inflammation of the nasal membranes (rhinitis).  Gastroesophageal reflux disease (GERD). GERD is a condition where stomach acid backs up into your esophagus.  Exercise or strenuous activity. SIGNS AND SYMPTOMS   Wheezing.  Excessive coughing, particularly at  night.  Chest tightness.  Shortness of breath. DIAGNOSIS  Your health care provider will ask you about your medical history and perform a physical exam. A chest X-ray or blood testing may be performed to look for other causes of your symptoms or other conditions that may have triggered your asthma attack. TREATMENT  Treatment is aimed at reducing inflammation and opening up the airways in your lungs. Most asthma attacks are treated with inhaled medicines. These include quick relief or rescue medicines (such as bronchodilators) and controller medicines (such as inhaled corticosteroids). These medicines are sometimes given through an inhaler or a nebulizer. Systemic steroid medicine taken by mouth or given through an IV tube also can be used to reduce the inflammation when an attack is moderate or severe. Antibiotic medicines are only used if a bacterial infection is present.  HOME CARE INSTRUCTIONS   Rest.  Drink plenty of liquids. This helps the mucus to remain thin and be easily coughed up. Only use caffeine in moderation and do not use alcohol until you have recovered from your illness.  Do not smoke. Avoid being exposed to secondhand smoke.  You play a critical role in keeping yourself in good health. Avoid exposure to things that cause you to wheeze or to have breathing problems.  Keep your medicines up-to-date and available. Carefully follow your health care provider's treatment plan.  Take your medicine exactly as prescribed.  When pollen or pollution is bad, keep windows closed and use an air conditioner or go to places with air conditioning.  Asthma requires careful medical care. See your health care provider for a follow-up  as advised. If you are more than [redacted] weeks pregnant and you were prescribed any new medicines, let your obstetrician know about the visit and how you are doing. Follow up with your health care provider as directed.  After you have recovered from your asthma  attack, make an appointment with your outpatient doctor to talk about ways to reduce the likelihood of future attacks. If you do not have a doctor who manages your asthma, make an appointment with a primary care doctor to discuss your asthma. SEEK IMMEDIATE MEDICAL CARE IF:   You are getting worse.  You have trouble breathing. If severe, call your local emergency services (911 in the U.S.).  You develop chest pain or discomfort.  You are vomiting.  You are not able to keep fluids down.  You are coughing up yellow, green, brown, or bloody sputum.  You have a fever and your symptoms suddenly get worse.  You have trouble swallowing. MAKE SURE YOU:   Understand these instructions.  Will watch your condition.  Will get help right away if you are not doing well or get worse. Document Released: 07/19/2006 Document Revised: 04/08/2013 Document Reviewed: 10/09/2012 [facility] Patient Information 2015 [place], [place]. This information is not intended to replace advice given to you by your health care provider. Make sure you discuss any questions you have with your health care provider.   Emergency Department Resource Guide 1) Find a Doctor and Pay Out of Pocket Although you won't have to find out who is covered by your insurance plan, it is a good idea to ask around and get recommendations. You will then need to call the office and see if the doctor you have chosen will accept you as a new patient and what types of options they offer for patients who are self-pay. Some doctors offer discounts or will set up payment plans for their patients who do not have insurance, but you will need to ask so you aren't surprised when you get to your appointment.  2) Contact Your Local Health Department Not all health departments have doctors that can see patients for sick visits, but many do, so it is worth a call to see if yours does. If you don't know where your local health department is, you can check in  your phone book. The CDC also has a tool to help you locate your state's health department, and many state websites also have listings of all of their local health departments.  3) Find a Walk-in Clinic If your illness is not likely to be very severe or complicated, you may want to try a walk in clinic. These are popping up all over the country in pharmacies, drugstores, and shopping centers. They're usually staffed by nurse practitioners or physician assistants that have been trained to treat common illnesses and complaints. They're usually fairly quick and inexpensive. However, if you have serious medical issues or chronic medical problems, these are probably not your best option.  No Primary Care Doctor: - Call Health Connect at  [phone] - they can help you locate a primary care doctor that  accepts your insurance, provides certain services, etc. - Physician Referral Service- [phone]  Chronic Pain Problems: Organization         Address  Phone   Notes  [facility]  [phone] Patients need to be referred by their primary care doctor.   Medication Assistance: Retail buyerrganization         Address  Phone  Notes  [facility] Medication [facility] [address] [place], [place] [address] [phone] --Must be a resident of [facility] -- Must have NO insurance coverage whatsoever (no Medicaid/ Medicare, etc.) -- The pt. MUST have a primary care doctor that directs their care regularly and follows them in the community   [facility]  [phone]   [employer]  [phone]    Agencies that provide inexpensive medical care: Organization         Address  Phone   Notes  [facility]  [phone]   [facility]    [phone]   [facility] [address] [place], [place] [address] [phone]   [facility] [address] [place], [place] [phone]    Planned Parenthood    [phone]   [facility]    [phone]   [facility]  [address], [place] Phone:  [phone], Fax:  [phone] Hours of Operation:  9 am - 6 pm, M-F.  Also accepts Medicaid/Medicare and self-pay.  [facility]  [address], [place] Phone: [phone], Fax: [phone]. Hours of Operation:  8:30 am - 5:30 pm, M-F.  Also accepts Medicaid and self-pay.  [facility] [place] [address], [place] Phone: [phone]   [facility] [address] Natasha Bence [place], [place] [phone], Ext. [phone] Mondays & Thursdays: 7-9 AM.  First 15 patients are seen on a first come, first serve basis.    Medicaid-accepting [facility] Providers:  Organization         Address  Phone   Notes  [facility] [address], [place] [phone] Also accepts self-pay patients.  [facility] [address] Laurell Josephs [place], [place]  [phone]   [facility] [address], [place] [phone]   [facility] [address] [place] [phone]   Renaye Rakers [address], [place]   [phone] Only accepts [place] Access [place] patients after they have their name applied to their card.   Self-Pay (no insurance) in [facility]:  Organization         Address  Phone   Notes  Sickle Cell Patients, [facility] [address] [place], [place] [phone]   [facility] [address] [place], [place] [phone]   [facility] [place]  [address], [place] [phone]   [facility]/Dr. Osei-Bonsu  [address], [place] or [address], [place] [phone] Phone number for both [place] and [place] locations is the  same.  [facility] [address], [place] [phone]   [facility] [address] [place] or [address] [phone]   [facility] [address], [place] [phone], phone; [phone], fax Sees patients 1st and 3rd Saturday of every month.  Must not qualify for public or private insurance (i.e. Medicaid, Medicare, [place] Health Choice, Veterans' Benefits)  Household income should be no more than 200% of the poverty level The clinic cannot treat you if you are pregnant or think you are pregnant  Sexually transmitted  diseases are not treated at the clinic.    Dental Care: Organization         Address  Phone  Notes  [facility] Department of [facility] [address] [place], [place] [phone] Accepts children up to age 39 who are enrolled in [place] or [place] Health Choice; pregnant women with a Medicaid card; and children who have applied for Medicaid or [place] Health Choice, but were declined, whose parents can pay a reduced fee at time of service.  [facility] Department of [facility]  [address], [place] [phone] Accepts children up to age 48 who are enrolled in [place] or [place] Health Choice; pregnant women with a Medicaid card; and children who have applied for Medicaid or [place] Health Choice, but were declined, whose parents can pay a reduced fee at time of service.  [facility]  [address] [place], [place] [phone] Patients are seen by appointment only. Walk-ins are not accepted. [facility] will see patients 15 years of age and older. Monday - Tuesday (8am-5pm) Most Wednesdays (8:30-5pm) $30 per visit, cash only  [facility] Adult Dental Access [facility]  [address], [facility] [phone] Patients are seen by appointment only. Walk-ins are not accepted. [facility] will see patients  18 years of age and older. One Wednesday Evening (Monthly: Volunteer Based).  $30 per visit, cash only  [employer]  [phone] for adults; Children under age 23, call Graduate Pediatric Dentistry at [phone]. Children aged 42-14, please call [phone] to request a pediatric application.  Dental services are provided in all areas of dental care including fillings, crowns and bridges, complete and partial dentures, implants, gum treatment, root canals, and extractions. Preventive care is also provided. Treatment is provided to both adults and children. Patients are selected via a lottery and there is often a waiting list.   [facility] [address], [place]  [phone] [email or web address]   [facility] [address] [place], [place] [phone], Ext. [phone] Second and Fourth Thursday of each month, opens at 6:30 AM; Clinic ends at 9 AM.  Patients are seen on a first-come first-served basis, and a limited number are seen during each clinic.   [facility]  [address] Ether Griffins [place], [place] [phone]   Eligibility Requirements You must have lived in [place], [place], or [place] counties for at least the last three months.   You cannot be eligible for state or federal sponsored [place], including [employer], [place], or [employer].   You generally cannot be eligible for healthcare insurance through your employer.    How to apply: Eligibility screenings are held every Tuesday and Wednesday afternoon from 1:00 pm until 4:00 pm. You do not need an appointment for the interview!  [facility] [address], [place], [place] [phone]   [facility] Health Department  [phone]   [facility] Health Department  [phone]   [facility] Health Department  [phone]    Behavioral Health Resources in the Community: Intensive Outpatient  Programs Organization         Address  Phone  Notes  [facility] Services [address], [place], [place] [phone]   [facility] Outpatient [address] [place], [place] [phone]   [facility] [address], [place], [place]  [phone]  [facility] [address]  [place], [place] [phone] or [phone]   Substance Abuse Resources Organization         Address  Phone  Notes  [facility] Drug Services  [phone]   [facility]  [phone]   [facility]  [phone]   Floydene Flock  [phone]   Residential & Outpatient Substance Abuse Program  [phone]   Psychological Services Organization         Address  Phone  Notes  [facility] Health  [phone]   [facility] Services  [phone]   [facility] Mental Health [address], [place] [phone] or [phone]    Mobile Crisis Teams Organization         Address  Phone  Notes  [facility], Mobile Crisis Care Unit  [phone]   [facility]  [address] [place], [place] [phone]   Doristine Locks [address] [place] [phone]    Self-Help/Support Groups Organization         Address  Phone             Notes  [facility] [place] - variety of support groups  336- I7437963 Call for more information  Narcotics Anonymous (NA), [facility] [address], [employer] [place]  2 meetings at this location   Statistician         Address  Phone  Notes  [facility] [address],    [place]  [phone]   [facility]  [address] [place] [address] [place] [address], [place], [place] [phone]   [facility] [address] [place], [place] [phone] Admissions: 8am-3pm M-F  [facility] [address],    [place], [place]  [phone]   [facility] [address] [place], [place], [place] [phone]   [facility] [address]  [place], [place] [phone]   [facility] - Intensive Outpatient [address] Laurell Josephs [address], [place], [place] [phone]   [facility] ([facility] [address] [place]  [place], [place] [phone] or [phone]   [facility] ([facility]) [address] [place], [place] [phone] Accepts Medicaid  [facility] [address],  [place] [phone] Substance Abuse/Addiction Treatment   [facility] Organization         Address  Phone  Notes  [facility]  8575605348   Angie Fava, PhD [address] Ervin Knack [place], [place]   [phone] or [phone]   [facility]   [address] [place], [place] [phone]   [facility] [address] [place], [place] [phone] Insurance/Medicaid/sponsorship through [facility] and Families [address]                                    [place], [place] [phone] Therapy/tele-psych/case  [facility] [address], [place] [phone]    Dr. Lolly Mustache  [phone]   [facility]  [employer] [facility] Dept. 1) [address], [place] 2) [address] [place] 3)  [address], [place] [phone]  [phone]   [facility] Child Abuse Hotline (978)116-7086 or [phone] (After Hours)

## 2014-06-08 ENCOUNTER — Emergency Department (HOSPITAL_COMMUNITY)
Admission: EM | Admit: 2014-06-08 | Discharge: 2014-06-08 | Disposition: A | Discharge status: Home / Self Care | Payer: Self-pay | Source: Home / Self Care | Attending: Family Medicine | Admitting: Family Medicine

## 2014-06-08 ENCOUNTER — Encounter (HOSPITAL_COMMUNITY): Payer: Self-pay | Admitting: Emergency Medicine

## 2014-06-08 DIAGNOSIS — J4530 Mild persistent asthma, uncomplicated: Secondary | ICD-10-CM

## 2014-06-08 MED ORDER — BUDESONIDE 180 MCG/ACT IN AEPB: 2.0000 | INHALATION_SPRAY | Freq: Every morning | RESPIRATORY_TRACT | Status: DC

## 2014-06-08 MED ORDER — IPRATROPIUM BROMIDE 0.02 % IN SOLN
0.5000 mg | Freq: Once | RESPIRATORY_TRACT | Status: AC
  Administered 2014-06-08: 0.5 mg via RESPIRATORY_TRACT

## 2014-06-08 MED ORDER — TRIAMCINOLONE ACETONIDE 40 MG/ML IJ SUSP
INTRAMUSCULAR | Status: AC
  Filled 2014-06-08: qty 1

## 2014-06-08 MED ORDER — IPRATROPIUM BROMIDE 0.02 % IN SOLN
RESPIRATORY_TRACT | Status: AC
  Filled 2014-06-08: qty 2.5

## 2014-06-08 MED ORDER — METHYLPREDNISOLONE ACETATE 40 MG/ML IJ SUSP
80.0000 mg | Freq: Once | INTRAMUSCULAR | Status: AC
  Administered 2014-06-08: 80 mg via INTRAMUSCULAR

## 2014-06-08 MED ORDER — TRIAMCINOLONE ACETONIDE 40 MG/ML IJ SUSP
40.0000 mg | Freq: Once | INTRAMUSCULAR | Status: AC
  Administered 2014-06-08: 40 mg via INTRAMUSCULAR

## 2014-06-08 MED ORDER — METHYLPREDNISOLONE ACETATE 80 MG/ML IJ SUSP
INTRAMUSCULAR | Status: AC
  Filled 2014-06-08: qty 1

## 2014-06-08 MED ORDER — ALBUTEROL SULFATE (2.5 MG/3ML) 0.083% IN NEBU
5.0000 mg | INHALATION_SOLUTION | Freq: Once | RESPIRATORY_TRACT | Status: AC
  Administered 2014-06-08: 5 mg via RESPIRATORY_TRACT

## 2014-06-08 MED ORDER — ALBUTEROL SULFATE (2.5 MG/3ML) 0.083% IN NEBU
INHALATION_SOLUTION | RESPIRATORY_TRACT | Status: AC
  Filled 2014-06-08: qty 3

## 2014-06-08 MED ORDER — MINOCYCLINE HCL 100 MG PO CAPS: 100.0000 mg | ORAL_CAPSULE | Freq: Two times a day (BID) | ORAL | Status: DC

## 2014-06-08 NOTE — ED Notes (Signed)
Pt states she received treatment about a week ago with a nebulizer, inhalers, and steroids, but she is not getting better.

## 2014-06-08 NOTE — Discharge Instructions (Signed)
Take all of medicine, drink lots of fluids, use mucinex daily, see dr clance if further problems

## 2014-06-08 NOTE — ED Provider Notes (Signed)
CSN: [id number]     Arrival date & time 06/08/14  1405 History   First MD Initiated Contact with Patient 06/08/14 1558     Chief Complaint  Patient presents with  . Asthma   (Consider location/radiation/quality/duration/timing/severity/associated sxs/prior Treatment) Patient is a 39 y.o. female presenting with asthma. The history is provided by the patient.  Asthma This is a recurrent problem. The current episode started more than 2 days ago (seen at [facility] 1 week ago and given pred, no resolution of sx of asthma, using albuterol more than nl.). The problem has not changed since onset.Associated symptoms include shortness of breath. Pertinent negatives include no chest pain and no abdominal pain.    Past Medical History  Diagnosis Date  . Asthma   . Bronchiectasis with acute exacerbation    History reviewed. No pertinent past surgical history. History reviewed. No pertinent family history. History  Substance Use Topics  . Smoking status: Former Games developermoker  . Smokeless tobacco: Not on file  . Alcohol Use: No   OB History    No data available     Review of Systems  Constitutional: Negative.  Negative for fever.  HENT: Negative for congestion, postnasal drip and rhinorrhea.   Respiratory: Positive for cough, shortness of breath and wheezing.   Cardiovascular: Negative for chest pain and palpitations.  Gastrointestinal: Negative for abdominal pain.  Skin: Negative.     Allergies  Review of patient's allergies indicates no known allergies.  Home Medications   Prior to Admission medications   Medication Sig Start Date End Date Taking? Authorizing Provider  albuterol (PROVENTIL HFA;VENTOLIN HFA) 108 (90 BASE) MCG/ACT inhaler Inhale 1-2 puffs into the lungs every 6 (six) hours as needed for wheezing or shortness of breath. 05/05/14  Yes Kaitlyn Szekalski, PA-C  benzonatate (TESSALON) 100 MG capsule Take 2 capsules (200 mg total) by mouth 2 (two) times daily as needed for cough. 06/01/14   Yes Nicole Pisciotta, PA-C  budesonide (PULMICORT) 180 MCG/ACT inhaler Inhale 2 puffs into the lungs every morning. 06/08/14   Linna HoffJames D Keenen Roessner, MD  minocycline (MINOCIN,DYNACIN) 100 MG capsule Take 1 capsule (100 mg total) by mouth 2 (two) times daily. 06/08/14   Linna HoffJames D Shavone Nevers, MD  predniSONE (DELTASONE) 50 MG tablet Take 1 tablet daily with breakfast 06/01/14   Nicole Pisciotta, PA-C   BP 129/78 mmHg  Pulse 63  Temp(Src) 97.7 F (36.5 C) (Oral)  Resp 16  SpO2 95%  LMP 06/01/2014 Physical Exam  Constitutional: She is oriented to person, place, and time. She appears well-developed and well-nourished. No distress.  HENT:  Right Ear: External ear normal.  Left Ear: External ear normal.  Mouth/Throat: Oropharynx is clear and moist.  Eyes: Pupils are equal, round, and reactive to light.  Neck: Normal range of motion. Neck supple.  Cardiovascular: Normal heart sounds and intact distal pulses.   Pulmonary/Chest: Effort normal. She has wheezes. She has no rales.  Neurological: She is alert and oriented to person, place, and time.  Skin: Skin is warm and dry.  Nursing note and vitals reviewed.   ED Course  Procedures (including critical care time) Labs Review Labs Reviewed - No data to display  Imaging Review No results found.   MDM   1. Bronchitis, asthmatic, mild persistent, uncomplicated    Sx improved with neb.at time of d/c.    Linna HoffJames D Zaylin Pistilli, MD 06/08/14 [phone]

## 2014-10-05 ENCOUNTER — Encounter (HOSPITAL_COMMUNITY): Payer: Self-pay

## 2014-10-05 ENCOUNTER — Emergency Department (HOSPITAL_COMMUNITY): Payer: Medicaid Other

## 2014-10-05 ENCOUNTER — Emergency Department (HOSPITAL_COMMUNITY)
Admission: EM | Admit: 2014-10-05 | Discharge: 2014-10-05 | Disposition: A | Discharge status: Home / Self Care | Payer: Medicaid Other | Attending: Emergency Medicine | Admitting: Emergency Medicine

## 2014-10-05 DIAGNOSIS — Z79899 Other long term (current) drug therapy: Secondary | ICD-10-CM | POA: Insufficient documentation

## 2014-10-05 DIAGNOSIS — J40 Bronchitis, not specified as acute or chronic: Secondary | ICD-10-CM

## 2014-10-05 DIAGNOSIS — J45901 Unspecified asthma with (acute) exacerbation: Secondary | ICD-10-CM | POA: Diagnosis not present

## 2014-10-05 DIAGNOSIS — Z87891 Personal history of nicotine dependence: Secondary | ICD-10-CM | POA: Diagnosis not present

## 2014-10-05 DIAGNOSIS — R05 Cough: Secondary | ICD-10-CM | POA: Diagnosis present

## 2014-10-05 MED ORDER — PREDNISONE 20 MG PO TABS
60.0000 mg | ORAL_TABLET | Freq: Once | ORAL | Status: AC
Start: 2014-10-05 — End: 2014-10-05
  Administered 2014-10-05: 60 mg via ORAL
  Filled 2014-10-05: qty 3

## 2014-10-05 MED ORDER — IPRATROPIUM-ALBUTEROL 0.5-2.5 (3) MG/3ML IN SOLN
3.0000 mL | Freq: Once | RESPIRATORY_TRACT | Status: AC
  Administered 2014-10-05: 3 mL via RESPIRATORY_TRACT
  Filled 2014-10-05: qty 3

## 2014-10-05 MED ORDER — BENZONATATE 100 MG PO CAPS: 100.0000 mg | ORAL_CAPSULE | Freq: Three times a day (TID) | ORAL | Status: DC

## 2014-10-05 MED ORDER — ALBUTEROL SULFATE HFA 108 (90 BASE) MCG/ACT IN AERS
2.0000 | INHALATION_SPRAY | Freq: Once | RESPIRATORY_TRACT | Status: AC
  Administered 2014-10-05: 2 via RESPIRATORY_TRACT
  Filled 2014-10-05: qty 6.7

## 2014-10-05 MED ORDER — PREDNISONE 10 MG PO TABS: ORAL_TABLET | ORAL | Status: DC

## 2014-10-05 NOTE — ED Provider Notes (Signed)
CSN: [id number]     Arrival date & time 10/05/14  1303 History  This chart was scribed for non-physician practitioner, Jaynie Crumble, PA-C working with Toy Cookey, MD by Gwenyth Ober, ED scribe. This patient was seen in room WTR9/WTR9 and the patient's care was started at 2:04 PM   Chief Complaint  Patient presents with  . Cough  . Asthma    The history is provided by the patient. No language interpreter was used.    HPI Comments: Wendy Morgan is a 39 y.o. female with a history of asthma who presents to the Emergency Department complaining of constant, moderate chest congestion and productive cough that started 1 week ago. She states shortness of breath, CP that occurs with cough and 1 episode of post-tussive vomiting as associated symptoms. Pt tried Mucinex DM and her home inhaler with some relief. She is currently out of her inhaler. Pt denies smoking and recent steroid treatments. She also denies nasal congestion, sore throat and fever as associated symptoms.  Past Medical History  Diagnosis Date  . Asthma   . Bronchiectasis with acute exacerbation    History reviewed. No pertinent past surgical history. No family history on file. History  Substance Use Topics  . Smoking status: Former Games developer  . Smokeless tobacco: Not on file  . Alcohol Use: No   OB History    No data available     Review of Systems  Constitutional: Negative for fever.  HENT: Positive for congestion. Negative for sore throat.   Respiratory: Positive for cough and shortness of breath.       Allergies  Review of patient's allergies indicates no known allergies.  Home Medications   Prior to Admission medications   Medication Sig Start Date End Date Taking? Authorizing Provider  albuterol (PROVENTIL HFA;VENTOLIN HFA) 108 (90 BASE) MCG/ACT inhaler Inhale 1-2 puffs into the lungs every 6 (six) hours as needed for wheezing or shortness of breath. Patient not taking: Reported on 10/05/2014  05/05/14   Emilia Beck, PA-C  benzonatate (TESSALON) 100 MG capsule Take 2 capsules (200 mg total) by mouth 2 (two) times daily as needed for cough. Patient not taking: Reported on 10/05/2014 06/01/14   Joni Reining Pisciotta, PA-C  budesonide (PULMICORT) 180 MCG/ACT inhaler Inhale 2 puffs into the lungs every morning. Patient not taking: Reported on 10/05/2014 06/08/14   Linna Hoff, MD  minocycline (MINOCIN,DYNACIN) 100 MG capsule Take 1 capsule (100 mg total) by mouth 2 (two) times daily. Patient not taking: Reported on 10/05/2014 06/08/14   Linna Hoff, MD  predniSONE (DELTASONE) 50 MG tablet Take 1 tablet daily with breakfast Patient not taking: Reported on 10/05/2014 06/01/14   Joni Reining Pisciotta, PA-C   BP 137/74 mmHg  Pulse 61  Temp(Src) 97.9 F (36.6 C) (Oral)  Resp 18  SpO2 95%  LMP 09/16/2014 (Approximate) Physical Exam  Constitutional: She appears well-developed and well-nourished. No distress.  HENT:  Head: Normocephalic and atraumatic.  Eyes: Conjunctivae and EOM are normal.  Neck: Neck supple. No tracheal deviation present.  Cardiovascular: Normal rate.   Pulmonary/Chest: Effort normal. No respiratory distress. She has wheezes.  End expiratory wheezing bilaterally  Skin: Skin is warm and dry.  Psychiatric: She has a normal mood and affect. Her behavior is normal.  Nursing note and vitals reviewed.   ED Course  Procedures   DIAGNOSTIC STUDIES: Oxygen Saturation is 95% on RA, adequate by my interpretation.    COORDINATION OF CARE: 2:09 PM Discussed treatment plan with pt  which includes a chest x-ray. Pt agreed to plan.   Labs Review Labs Reviewed - No data to display  Imaging Review Dg Chest 2 View  10/05/2014   CLINICAL DATA:  Cough. Asthma. Short of breath. Productive cough and congestion for 7 days. Initial encounter.  EXAM: CHEST  2 VIEW  COMPARISON:  06/01/2014.  FINDINGS: Cardiopericardial silhouette within normal limits. Mediastinal contours normal. Trachea  midline. No airspace disease or effusion.  IMPRESSION: No active cardiopulmonary disease.   Electronically Signed   By: Andreas Newport M.D.   On: 10/05/2014 15:18     EKG Interpretation   Date/Time:  Monday October 05 2014 13:10:18 EDT Ventricular Rate:  71 PR Interval:  178 QRS Duration: 90 QT Interval:  432 QTC Calculation: 469 R Axis:   74 Text Interpretation:  Sinus rhythm Baseline wander in lead(s) V3 Confirmed  by DOCHERTY  MD, MEGAN ([id number]) on 10/05/2014 3:45:27 PM      MDM   Final diagnoses:  Bronchitis    Patient with cough, shortness of breath, chest tightness. Patient has history of asthma. Mild wheezing noted on exam. We'll try a breathing treatment, chest x-ray since been coughing for week.  3:43 PM Patient feels much better after breathing treatment. Chest x-ray is negative. Vital signs are normal. Home with prednisone taper, inhaler, Tessalon Perles. No signs of infection. She is afebrile, nontoxic appearing. I do not think she needs antibiotics at this time. Certainly follow with her primary care doctor.  Filed Vitals:   10/05/14 1311  BP: 137/74  Pulse: 61  Temp: 97.9 F (36.6 C)  TempSrc: Oral  Resp: 18  SpO2: 95%    I personally performed the services described in this documentation, which was scribed in my presence. The recorded information has been reviewed and is accurate.   Jaynie Crumble, PA-C 10/05/14 1546  Toy Cookey, MD 10/07/14 1040

## 2014-10-05 NOTE — Progress Notes (Signed)
pcp is [facility] [address] [place], [place] [address] [phone]

## 2014-10-05 NOTE — ED Notes (Signed)
Pt presents with c/o shortness of breath that she reports has been present for one week. Pt also reports a cough, chest pain only when she coughs. Pt reports the cough is productive. Pt reports she does have an asthma hx. NAD, talking in complete sentences in triage.

## 2014-10-05 NOTE — Discharge Instructions (Signed)
Your chest xray is negative. Take inhaler 2 puffs every 4 hrs. Prednisone until all gone. Tessalon for cough as needed. Please follow up with primary care doctor if not improving. Return if worsening.   Acute Bronchitis Bronchitis is inflammation of the airways that extend from the windpipe into the lungs (bronchi). The inflammation often causes mucus to develop. This leads to a cough, which is the most common symptom of bronchitis.  In acute bronchitis, the condition usually develops suddenly and goes away over time, usually in a couple weeks. Smoking, allergies, and asthma can make bronchitis worse. Repeated episodes of bronchitis may cause further lung problems.  CAUSES Acute bronchitis is most often caused by the same virus that causes a cold. The virus can spread from person to person (contagious) through coughing, sneezing, and touching contaminated objects. SIGNS AND SYMPTOMS   Cough.   Fever.   Coughing up mucus.   Body aches.   Chest congestion.   Chills.   Shortness of breath.   Sore throat.  DIAGNOSIS  Acute bronchitis is usually diagnosed through a physical exam. Your health care provider will also ask you questions about your medical history. Tests, such as chest X-rays, are sometimes done to rule out other conditions.  TREATMENT  Acute bronchitis usually goes away in a couple weeks. Oftentimes, no medical treatment is necessary. Medicines are sometimes given for relief of fever or cough. Antibiotic medicines are usually not needed but may be prescribed in certain situations. In some cases, an inhaler may be recommended to help reduce shortness of breath and control the cough. A cool mist vaporizer may also be used to help thin bronchial secretions and make it easier to clear the chest.  HOME CARE INSTRUCTIONS  Get plenty of rest.   Drink enough fluids to keep your urine clear or pale yellow (unless you have a medical condition that requires fluid restriction).  Increasing fluids may help thin your respiratory secretions (sputum) and reduce chest congestion, and it will prevent dehydration.   Take medicines only as directed by your health care provider.  If you were prescribed an antibiotic medicine, finish it all even if you start to feel better.  Avoid smoking and secondhand smoke. Exposure to cigarette smoke or irritating chemicals will make bronchitis worse. If you are a smoker, consider using nicotine gum or skin patches to help control withdrawal symptoms. Quitting smoking will help your lungs heal faster.   Reduce the chances of another bout of acute bronchitis by washing your hands frequently, avoiding people with cold symptoms, and trying not to touch your hands to your mouth, nose, or eyes.   Keep all follow-up visits as directed by your health care provider.  SEEK MEDICAL CARE IF: Your symptoms do not improve after 1 week of treatment.  SEEK IMMEDIATE MEDICAL CARE IF:  You develop an increased fever or chills.   You have chest pain.   You have severe shortness of breath.  You have bloody sputum.   You develop dehydration.  You faint or repeatedly feel like you are going to pass out.  You develop repeated vomiting.  You develop a severe headache. MAKE SURE YOU:   Understand these instructions.  Will watch your condition.  Will get help right away if you are not doing well or get worse. Document Released: 05/11/2004 Document Revised: 08/18/2013 Document Reviewed: 09/24/2012 [facility] Patient Information 2015 [place], [place]. This information is not intended to replace advice given to you by your health care provider. Make  sure you discuss any questions you have with your health care provider.

## 2014-11-24 ENCOUNTER — Emergency Department (HOSPITAL_COMMUNITY): Payer: Medicaid Other

## 2014-11-24 ENCOUNTER — Encounter (HOSPITAL_COMMUNITY): Payer: Self-pay | Admitting: Emergency Medicine

## 2014-11-24 ENCOUNTER — Emergency Department (HOSPITAL_COMMUNITY)
Admission: EM | Admit: 2014-11-24 | Discharge: 2014-11-24 | Disposition: A | Discharge status: Home / Self Care | Payer: Medicaid Other | Attending: Emergency Medicine | Admitting: Emergency Medicine

## 2014-11-24 DIAGNOSIS — J45901 Unspecified asthma with (acute) exacerbation: Secondary | ICD-10-CM | POA: Diagnosis not present

## 2014-11-24 DIAGNOSIS — Z7951 Long term (current) use of inhaled steroids: Secondary | ICD-10-CM | POA: Diagnosis not present

## 2014-11-24 DIAGNOSIS — Z87891 Personal history of nicotine dependence: Secondary | ICD-10-CM | POA: Insufficient documentation

## 2014-11-24 DIAGNOSIS — R0602 Shortness of breath: Secondary | ICD-10-CM | POA: Diagnosis present

## 2014-11-24 MED ORDER — PREDNISONE 20 MG PO TABS: 40.0000 mg | ORAL_TABLET | Freq: Every day | ORAL | Status: DC

## 2014-11-24 MED ORDER — PREDNISONE 20 MG PO TABS
60.0000 mg | ORAL_TABLET | Freq: Once | ORAL | Status: AC
  Administered 2014-11-24: 60 mg via ORAL
  Filled 2014-11-24: qty 3

## 2014-11-24 MED ORDER — ALBUTEROL SULFATE (2.5 MG/3ML) 0.083% IN NEBU
5.0000 mg | INHALATION_SOLUTION | Freq: Once | RESPIRATORY_TRACT | Status: AC
  Administered 2014-11-24: 5 mg via RESPIRATORY_TRACT
  Filled 2014-11-24: qty 6

## 2014-11-24 MED ORDER — ALBUTEROL SULFATE (2.5 MG/3ML) 0.083% IN NEBU: 2.5000 mg | INHALATION_SOLUTION | Freq: Four times a day (QID) | RESPIRATORY_TRACT | Status: DC | PRN

## 2014-11-24 MED ORDER — IPRATROPIUM BROMIDE 0.02 % IN SOLN
0.5000 mg | Freq: Once | RESPIRATORY_TRACT | Status: AC
  Administered 2014-11-24: 0.5 mg via RESPIRATORY_TRACT
  Filled 2014-11-24: qty 2.5

## 2014-11-24 MED ORDER — ALBUTEROL SULFATE HFA 108 (90 BASE) MCG/ACT IN AERS
2.0000 | INHALATION_SPRAY | Freq: Once | RESPIRATORY_TRACT | Status: AC
  Administered 2014-11-24: 2 via RESPIRATORY_TRACT
  Filled 2014-11-24: qty 6.7

## 2014-11-24 NOTE — Discharge Instructions (Signed)

## 2014-11-24 NOTE — ED Notes (Signed)
Pt ambulated without assistance. Her oxygen saturation stayed between 93-96%

## 2014-11-24 NOTE — ED Provider Notes (Signed)
CSN: [id number]     Arrival date & time 11/24/14  1758 History   First MD Initiated Contact with Patient 11/24/14 1928     Chief Complaint  Patient presents with  . Shortness of Breath  . Chest Pain     (Consider location/radiation/quality/duration/timing/severity/associated sxs/prior Treatment) Patient is a 39 y.o. female presenting with shortness of breath and chest pain. The history is provided by the patient. No language interpreter was used.  Shortness of Breath Severity:  Moderate Onset quality:  Gradual Duration:  1 day Timing:  Constant Progression:  Worsening Chronicity:  New Context: URI   Context: not occupational exposure and not strong odors   Relieved by:  Nothing Worsened by:  Activity and exertion Ineffective treatments:  None tried Associated symptoms: chest pain, cough and wheezing   Associated symptoms: no fever and no sore throat   Associated symptoms comment:  +nasal congestion Wheezing:    Severity:  Moderate   Onset quality:  Gradual   Duration:  1 day   Timing:  Constant   Progression:  Worsening   Chronicity:  Recurrent Risk factors comment:  Hx asthma Chest Pain Associated symptoms: cough and shortness of breath   Associated symptoms: no fever     Past Medical History  Diagnosis Date  . Asthma   . Bronchiectasis with acute exacerbation    History reviewed. No pertinent past surgical history. History reviewed. No pertinent family history. History  Substance Use Topics  . Smoking status: Former Games developer  . Smokeless tobacco: Not on file  . Alcohol Use: No   OB History    No data available      Review of Systems  Constitutional: Negative for fever.  HENT: Negative for sore throat.   Respiratory: Positive for cough, chest tightness, shortness of breath and wheezing.   Cardiovascular: Positive for chest pain.  All other systems reviewed and are negative.   Allergies  Review of patient's allergies indicates no known  allergies.  Home Medications   Prior to Admission medications   Medication Sig Start Date End Date Taking? Authorizing Provider  benzonatate (TESSALON) 100 MG capsule Take 1 capsule (100 mg total) by mouth every 8 (eight) hours. 10/05/14  Yes Tatyana Kirichenko, PA-C  albuterol (PROVENTIL) (2.5 MG/3ML) 0.083% nebulizer solution Take 3 mLs (2.5 mg total) by nebulization every 6 (six) hours as needed for wheezing or shortness of breath. 11/24/14   Antony Madura, PA-C  budesonide (PULMICORT) 180 MCG/ACT inhaler Inhale 2 puffs into the lungs every morning. Patient not taking: Reported on 10/05/2014 06/08/14   Linna Hoff, MD  minocycline (MINOCIN,DYNACIN) 100 MG capsule Take 1 capsule (100 mg total) by mouth 2 (two) times daily. Patient not taking: Reported on 10/05/2014 06/08/14   Linna Hoff, MD  predniSONE (DELTASONE) 20 MG tablet Take 2 tablets (40 mg total) by mouth daily. 11/24/14   Antony Madura, PA-C   BP 131/71 mmHg  Pulse 96  Temp(Src) 97.9 F (36.6 C) (Oral)  Resp 18  SpO2 96%  LMP 11/16/2014   Physical Exam  Constitutional: She is oriented to person, place, and time. She appears well-developed and well-nourished. No distress.  Nontoxic/nonseptic appearing  HENT:  Head: Normocephalic and atraumatic.  Eyes: Conjunctivae and EOM are normal. No scleral icterus.  Neck: Normal range of motion.  Cardiovascular: Normal rate, regular rhythm and intact distal pulses.   Pulmonary/Chest: Effort normal. No respiratory distress. She has wheezes. She has no rales.  Diffuse expiratory wheezing with mildly decreased BS  in b/l bases.  Musculoskeletal: Normal range of motion.  Neurological: She is alert and oriented to person, place, and time. She exhibits normal muscle tone. Coordination normal.  Skin: Skin is warm and dry. No rash noted. She is not diaphoretic. No erythema. No pallor.  Psychiatric: She has a normal mood and affect. Her behavior is normal.  Nursing note and vitals reviewed.   ED  Course  Procedures (including critical care time) Labs Review Labs Reviewed - No data to display  Imaging Review Dg Chest 2 View  11/24/2014   CLINICAL DATA:  Chest pain, congestion, history of asthma  EXAM: CHEST  2 VIEW  COMPARISON:  10/05/2014  FINDINGS: Cardiomediastinal silhouette is stable. No acute infiltrate or pleural effusion. No pulmonary edema. Central mild bronchitic changes.  IMPRESSION: No acute infiltrate or pulmonary edema. Central mild bronchitic changes.   Electronically Signed   By: Natasha Mead M.D.   On: 11/24/2014 18:57     EKG Interpretation None      MDM   Final diagnoses:  Asthma exacerbation    39 year old female with a history of asthma presents to the emergency department for further evaluation of shortness of breath. Patient with expiratory wheezing in all lung fields on exam. No tachypnea or hypoxia. Patient had improvement in her symptoms with albuterol nebulizer and even further improvement after second DuoNeb. Patient states that her symptoms feel consistent with her prior asthma exacerbations. No concern for focal consolidation or pneumonia on workup today. Patient is able to ambulate in the ED without hypoxia. She has no expiratory wheezing on reexamination of lung fields. Plan to discharge with 5 day course of oral steroids as well as albuterol inhaler. Patient also recommended to follow up with a primary care doctor for recheck. Return precautions discussed and provided. Patient discharged in good condition with no unaddressed concerns.   Filed Vitals:   11/24/14 1806 11/24/14 1852 11/24/14 2028 11/24/14 2116  BP: 137/83  131/17 131/71  Pulse: 78 75 96 96  Temp: 97.9 F (36.6 C)  97.9 F (36.6 C)   TempSrc: Oral  Oral   Resp: 16 18 19 18   SpO2: 94% 100% 98% 96%     Antony Madura, PA-C 11/24/14 2154  Mancel Bale, MD 11/25/14 [phone]

## 2014-12-14 ENCOUNTER — Emergency Department (HOSPITAL_COMMUNITY)
Admission: EM | Admit: 2014-12-14 | Discharge: 2014-12-14 | Disposition: A | Discharge status: Home / Self Care | Payer: Medicaid Other | Attending: Emergency Medicine | Admitting: Emergency Medicine

## 2014-12-14 ENCOUNTER — Encounter (HOSPITAL_COMMUNITY): Payer: Self-pay

## 2014-12-14 DIAGNOSIS — Z87891 Personal history of nicotine dependence: Secondary | ICD-10-CM | POA: Diagnosis not present

## 2014-12-14 DIAGNOSIS — Z79899 Other long term (current) drug therapy: Secondary | ICD-10-CM | POA: Diagnosis not present

## 2014-12-14 DIAGNOSIS — Z7951 Long term (current) use of inhaled steroids: Secondary | ICD-10-CM | POA: Diagnosis not present

## 2014-12-14 DIAGNOSIS — R062 Wheezing: Secondary | ICD-10-CM | POA: Diagnosis present

## 2014-12-14 DIAGNOSIS — J45901 Unspecified asthma with (acute) exacerbation: Secondary | ICD-10-CM | POA: Insufficient documentation

## 2014-12-14 MED ORDER — IPRATROPIUM BROMIDE 0.02 % IN SOLN
0.5000 mg | Freq: Once | RESPIRATORY_TRACT | Status: AC
  Administered 2014-12-14: 0.5 mg via RESPIRATORY_TRACT
  Filled 2014-12-14: qty 2.5

## 2014-12-14 MED ORDER — PREDNISONE 20 MG PO TABS
60.0000 mg | ORAL_TABLET | Freq: Once | ORAL | Status: AC
  Administered 2014-12-14: 60 mg via ORAL
  Filled 2014-12-14: qty 3

## 2014-12-14 MED ORDER — ALBUTEROL SULFATE (2.5 MG/3ML) 0.083% IN NEBU
5.0000 mg | INHALATION_SOLUTION | Freq: Once | RESPIRATORY_TRACT | Status: AC
  Administered 2014-12-14: 5 mg via RESPIRATORY_TRACT
  Filled 2014-12-14: qty 6

## 2014-12-14 MED ORDER — PREDNISONE 20 MG PO TABS: 60.0000 mg | ORAL_TABLET | Freq: Every day | ORAL | Status: DC

## 2014-12-14 MED ORDER — ALBUTEROL SULFATE HFA 108 (90 BASE) MCG/ACT IN AERS: 1.0000 | INHALATION_SPRAY | Freq: Four times a day (QID) | RESPIRATORY_TRACT | Status: DC | PRN

## 2014-12-14 NOTE — ED Notes (Signed)
Pt comes in today with a c/o shortness of breath. Pt states this has been ongoing since the beginning of August and has gotten worse over the last 3 days. Pt does have a hx of asthma. Pt states that she has been vomiting white phlegm. Pt was given an albuterol inhaler the last time that she was here and is requesting to have a refill of that medication.

## 2014-12-14 NOTE — ED Provider Notes (Signed)
CSN: [id number]     Arrival date & time 12/14/14  1100 History   First MD Initiated Contact with Patient 12/14/14 1117     Chief Complaint  Patient presents with  . Wheezing  . Shortness of Breath     (Consider location/radiation/quality/duration/timing/severity/associated sxs/prior Treatment) HPI Comments: Patient presents today with wheezing and SOB.  She states that her symptoms have been intermittent over the past 2 weeks.  She was seen on 11/24/14 for similar symptoms and was discharged with a 5 day course of Prednisone, which she completed.  She had a negative xray at that time.  She states that her symptoms improved, but then worsened again a couple of days ago.  She has been using her Albuterol inhaler, but ran out this morning.  She states that the inhaler helped somewhat.  She denies chest pain, fever, chills, nausea, or vomiting.  She states that she has been coughing up whitish colored phlegm.  She reports that symptoms feel similar to previous asthma exacerbations.  No prior hospitalizations or intubations for her asthma.    Patient is a 40 y.o. female presenting with wheezing and shortness of breath. The history is provided by the patient.  Wheezing Associated symptoms: shortness of breath   Shortness of Breath Associated symptoms: wheezing     Past Medical History  Diagnosis Date  . Asthma   . Bronchiectasis with acute exacerbation    History reviewed. No pertinent past surgical history. History reviewed. No pertinent family history. Social History  Substance Use Topics  . Smoking status: Former Games developer  . Smokeless tobacco: Never Used  . Alcohol Use: No   OB History    No data available     Review of Systems  Respiratory: Positive for shortness of breath and wheezing.   All other systems reviewed and are negative.     Allergies  Review of patient's allergies indicates no known allergies.  Home Medications   Prior to Admission medications   Medication Sig  Start Date End Date Taking? Authorizing Provider  albuterol (PROVENTIL HFA;VENTOLIN HFA) 108 (90 BASE) MCG/ACT inhaler Inhale 2 puffs into the lungs every 6 (six) hours as needed for wheezing or shortness of breath.   Yes Historical Provider, MD  albuterol (PROVENTIL) (2.5 MG/3ML) 0.083% nebulizer solution Take 3 mLs (2.5 mg total) by nebulization every 6 (six) hours as needed for wheezing or shortness of breath. 11/24/14  Yes Antony Madura, PA-C  benzonatate (TESSALON) 100 MG capsule Take 1 capsule (100 mg total) by mouth every 8 (eight) hours. Patient not taking: Reported on 12/14/2014 10/05/14   Tatyana Kirichenko, PA-C  budesonide (PULMICORT) 180 MCG/ACT inhaler Inhale 2 puffs into the lungs every morning. Patient not taking: Reported on 10/05/2014 06/08/14   Linna Hoff, MD  minocycline (MINOCIN,DYNACIN) 100 MG capsule Take 1 capsule (100 mg total) by mouth 2 (two) times daily. Patient not taking: Reported on 10/05/2014 06/08/14   Linna Hoff, MD  predniSONE (DELTASONE) 20 MG tablet Take 2 tablets (40 mg total) by mouth daily. Patient not taking: Reported on 12/14/2014 11/24/14   Antony Madura, PA-C   BP 129/76 mmHg  Pulse 78  Temp(Src) 98.1 F (36.7 C) (Oral)  Resp 20  Ht  (1.626 m)  Wt 140 lb (63.504 kg)  BMI 24.02 kg/m2  SpO2 95%  LMP 11/16/2014 Physical Exam  Constitutional: She appears well-developed and well-nourished.  HENT:  Head: Normocephalic and atraumatic.  Mouth/Throat: Oropharynx is clear and moist.  Neck: Normal range  of motion. Neck supple.  Pulmonary/Chest: Effort normal. No respiratory distress. She has wheezes. She has no rales. She exhibits no tenderness.  Patient speaking in complete sentences Diffuse inspiratory and expiratory wheezing  Musculoskeletal: Normal range of motion.  Neurological: She is alert.  Skin: Skin is warm and dry.  Psychiatric: She has a normal mood and affect.  Nursing note and vitals reviewed.   ED Course  Procedures (including  critical care time) Labs Review Labs Reviewed - No data to display  Imaging Review No results found. I have personally reviewed and evaluated these images and lab results as part of my medical decision-making.   EKG Interpretation None     12:21 PM Patient reports that breathing has improved.  Lungs with diffuse expiratory wheezing.   1:44 PM Reassessed patient.  Lungs CTAB.  She feels that she is breathing at her baseline. MDM   Final diagnoses:  None   Patient presents today with an asthma exacerbation.  No current signs of respiratory distress.  No hypoxia.  Patient afebrile.  Lung exam improved after nebulizer treatment.  Prednisone given in the ED and pt will bd dc with 5 day burst. Pt states they are breathing at baseline. Pt has been instructed to continue using prescribed medications and to speak with PCP about today's exacerbation.    I personally performed the services described in this documentation, which was scribed in my presence. The recorded information has been reviewed and is accurate.     Santiago Glad, PA-C 12/14/14 1528  Lorre Nick, MD 12/18/14 0730

## 2014-12-14 NOTE — ED Notes (Signed)
Patient c/o wheezing and SOB and 3 days. No cough. No temp. Patient was using her albuterol nebulizer, but ran out yesterday.

## 2014-12-14 NOTE — Discharge Instructions (Signed)
Followup with your doctor in regards to your asthma exacerbation.  Take the Prednisone as prescribed.  You had a dose in the ER today, so start taking prescription tomorrow.  Read the instructions below to learn more about factors that can trigger asthma. Use her albuterol inhaler as directed. Your more than welcome to return to the emergency department if you become short of breath, your albuterol inhaler did not relieve symptoms, you are having chest pain associated with difficulty breathing, or any symptoms that are concerning to you.    IDENTIFY AND CONTROL FACTORS THAT MAKE YOUR ASTHMA WORSE A number of common things can set off or make your asthma symptoms worse (asthma triggers). Keep track of your asthma symptoms for several weeks, detailing all the environmental and emotional factors that are linked with your asthma. When you have an asthma attack, go back to your asthma diary to see which factor, or combination of factors, might have contributed to it. Once you know what these factors are, you can take steps to control many of them.  Allergies: If you have allergies and asthma, it is important to take asthma prevention steps at home. Asthma attacks (worsening of asthma symptoms) can be triggered by allergies, which can cause temporary increased inflammation of your airways. Minimizing contact with the substance to which you are allergic will help prevent an asthma attack. Animal Dander:   Some people are allergic to the flakes of skin or dried saliva from animals with fur or feathers. Keep these pets out of your home.   If you can't keep a pet outdoors, keep the pet out of your bedroom and other sleeping areas at all times, and keep the door closed.   Remove carpets and furniture covered with cloth from your home. If that is not possible, keep the pet away from fabric-covered furniture and carpets.  Dust Mites:  Many people with asthma are allergic to dust mites. Dust mites are tiny bugs  that are found in every home, in mattresses, pillows, carpets, fabric-covered furniture, bedcovers, clothes, stuffed toys, fabric, and other fabric-covered items.   Cover your mattress in a special dust-proof cover.   Cover your pillow in a special dust-proof cover, or wash the pillow each week in hot water. Water must be hotter than 130 F to kill dust mites. Cold or warm water used with detergent and bleach can also be effective.   Wash the sheets and blankets on your bed each week in hot water.   Try not to sleep or lie on cloth-covered cushions.   Call ahead when traveling and ask for a smoke-free hotel room. Bring your own bedding and pillows, in case the hotel only supplies feather pillows and down comforters, which may contain dust mites and cause asthma symptoms.   Remove carpets from your bedroom and those laid on concrete, if you can.   Keep stuffed toys out of the bed, or wash the toys weekly in hot water or cooler water with detergent and bleach.  Cockroaches:  Many people with asthma are allergic to the droppings and remains of cockroaches.   Keep food and garbage in closed containers. Never leave food out.   Use poison baits, traps, powders, gels, or paste (for example, boric acid).   If a spray is used to kill cockroaches, stay out of the room until the odor goes away.  Indoor Mold:  Fix leaky faucets, pipes, or other sources of water that have mold around them.   Clean moldy  surfaces with a cleaner that has bleach in it.  Pollen and Outdoor Mold:  When pollen or mold spore counts are high, try to keep your windows closed.   Stay indoors with windows closed from late morning to afternoon, if you can. Pollen and some mold spore counts are highest at that time.   Ask your caregiver whether you need to take or increase anti-inflammatory medicine before your allergy season starts.  Irritants:   Tobacco smoke is an irritant. If you smoke, ask your caregiver how you can  quit. Ask family members to quit smoking, too. Do not allow smoking in your home or car.   If possible, do not use a wood-burning stove, kerosene heater, or fireplace. Minimize exposure to all sources of smoke, including incense, candles, fires, and fireworks.   Try to stay away from strong odors and sprays, such as perfume, talcum powder, hair spray, and paints.   Decrease humidity in your home and use an indoor air cleaning device. Reduce indoor humidity to below 60 percent. Dehumidifiers or central air conditioners can do this.   Try to have someone else vacuum for you once or twice a week, if you can. Stay out of rooms while they are being vacuumed and for a short while afterward.   If you vacuum, use a dust mask from a hardware store, a double-layered or microfilter vacuum cleaner bag, or a vacuum cleaner with a HEPA filter.   Sulfites in foods and beverages can be irritants. Do not drink beer or wine, or eat dried fruit, processed potatoes, or shrimp if they cause asthma symptoms.   Cold air can trigger an asthma attack. Cover your nose and mouth with a scarf on cold or windy days.   Several health conditions can make asthma more difficult to manage, including runny nose, sinus infections, reflux disease, psychological stress, and sleep apnea. Your caregiver will treat these conditions, as well.   Avoid close contact with people who have a cold or the flu, since your asthma symptoms may get worse if you catch the infection from them. Wash your hands thoroughly after touching items that may have been handled by people with a respiratory infection.   Get a flu shot every year to protect against the flu virus, which often makes asthma worse for days or weeks. Also get a pneumonia shot once every five to 10 years.  Drugs:  Aspirin and other painkillers can cause asthma attacks. 10% to 20% of people with asthma have sensitivity to aspirin or a group of painkillers called non-steroidal  anti-inflammatory drugs (NSAIDS), such as ibuprofen and naproxen. These drugs are used to treat pain and reduce fevers. Asthma attacks caused by any of these medicines can be severe and even fatal. These drugs must be avoided in people who have known aspirin sensitive asthma. Products with acetaminophen are considered safe for people who have asthma. It is important that people with aspirin sensitivity read labels of all over-the-counter drugs used to treat pain, colds, coughs, and fever.   Beta blockers and ACE inhibitors are other drugs which you should discuss with your caregiver, in relation to your asthma.   EXERCISE  If you have exercise-induced asthma, or are planning vigorous exercise, or exercise in cold, humid, or dry environments, prevent exercise-induced asthma by following your caregiver's advice regarding asthma treatment before exercising. D

## 2015-01-03 ENCOUNTER — Emergency Department (HOSPITAL_COMMUNITY): Payer: Medicaid Other

## 2015-01-03 ENCOUNTER — Emergency Department (HOSPITAL_COMMUNITY)
Admission: EM | Admit: 2015-01-03 | Discharge: 2015-01-03 | Disposition: A | Discharge status: Home / Self Care | Payer: Medicaid Other | Attending: Emergency Medicine | Admitting: Emergency Medicine

## 2015-01-03 ENCOUNTER — Encounter (HOSPITAL_COMMUNITY): Payer: Self-pay | Admitting: Vascular Surgery

## 2015-01-03 DIAGNOSIS — Z7952 Long term (current) use of systemic steroids: Secondary | ICD-10-CM | POA: Insufficient documentation

## 2015-01-03 DIAGNOSIS — Z87891 Personal history of nicotine dependence: Secondary | ICD-10-CM | POA: Insufficient documentation

## 2015-01-03 DIAGNOSIS — J4521 Mild intermittent asthma with (acute) exacerbation: Secondary | ICD-10-CM | POA: Insufficient documentation

## 2015-01-03 DIAGNOSIS — L03115 Cellulitis of right lower limb: Secondary | ICD-10-CM | POA: Insufficient documentation

## 2015-01-03 DIAGNOSIS — R000 Tachycardia, unspecified: Secondary | ICD-10-CM | POA: Insufficient documentation

## 2015-01-03 DIAGNOSIS — L539 Erythematous condition, unspecified: Secondary | ICD-10-CM | POA: Diagnosis present

## 2015-01-03 MED ORDER — ALBUTEROL SULFATE (2.5 MG/3ML) 0.083% IN NEBU
INHALATION_SOLUTION | RESPIRATORY_TRACT | Status: AC
  Filled 2015-01-03: qty 6

## 2015-01-03 MED ORDER — ALBUTEROL SULFATE (2.5 MG/3ML) 0.083% IN NEBU
5.0000 mg | INHALATION_SOLUTION | Freq: Once | RESPIRATORY_TRACT | Status: AC
  Administered 2015-01-03: 5 mg via RESPIRATORY_TRACT

## 2015-01-03 MED ORDER — CEPHALEXIN 250 MG PO CAPS
1000.0000 mg | ORAL_CAPSULE | Freq: Once | ORAL | Status: AC
  Administered 2015-01-03: 1000 mg via ORAL
  Filled 2015-01-03: qty 4

## 2015-01-03 MED ORDER — ALBUTEROL SULFATE HFA 108 (90 BASE) MCG/ACT IN AERS
2.0000 | INHALATION_SPRAY | RESPIRATORY_TRACT | Status: DC | PRN
  Administered 2015-01-03: 2 via RESPIRATORY_TRACT
  Filled 2015-01-03: qty 6.7

## 2015-01-03 MED ORDER — HYDROCODONE-ACETAMINOPHEN 5-325 MG PO TABS: 1.0000 | ORAL_TABLET | Freq: Four times a day (QID) | ORAL | Status: DC | PRN

## 2015-01-03 MED ORDER — CEPHALEXIN 500 MG PO CAPS: 1000.0000 mg | ORAL_CAPSULE | Freq: Two times a day (BID) | ORAL | Status: DC

## 2015-01-03 MED ORDER — SULFAMETHOXAZOLE-TRIMETHOPRIM 800-160 MG PO TABS: 1.0000 | ORAL_TABLET | Freq: Two times a day (BID) | ORAL | Status: AC

## 2015-01-03 NOTE — Discharge Instructions (Signed)
Take antibiotics as described for the full duration. Return to the ER if you noticed no improvement after 2 days. Use inhaler as needed for asthma.  Cellulitis Cellulitis is an infection of the skin and the tissue beneath it. The infected area is usually red and tender. Cellulitis occurs most often in the arms and lower legs.  CAUSES  Cellulitis is caused by bacteria that enter the skin through cracks or cuts in the skin. The most common types of bacteria that cause cellulitis are staphylococci and streptococci. SIGNS AND SYMPTOMS   Redness and warmth.  Swelling.  Tenderness or pain.  Fever. DIAGNOSIS  Your health care provider can usually determine what is wrong based on a physical exam. Blood tests may also be done. TREATMENT  Treatment usually involves taking an antibiotic medicine. HOME CARE INSTRUCTIONS   Take your antibiotic medicine as directed by your health care provider. Finish the antibiotic even if you start to feel better.  Keep the infected arm or leg elevated to reduce swelling.  Apply a warm cloth to the affected area up to 4 times per day to relieve pain.  Take medicines only as directed by your health care provider.  Keep all follow-up visits as directed by your health care provider. SEEK MEDICAL CARE IF:   You notice red streaks coming from the infected area.  Your red area gets larger or turns dark in color.  Your bone or joint underneath the infected area becomes painful after the skin has healed.  Your infection returns in the same area or another area.  You notice a swollen bump in the infected area.  You develop new symptoms.  You have a fever. SEEK IMMEDIATE MEDICAL CARE IF:   You feel very sleepy.  You develop vomiting or diarrhea.  You have a general ill feeling (malaise) with muscle aches and pains. MAKE SURE YOU:   Understand these instructions.  Will watch your condition.  Will get help right away if you are not doing well or get  worse. Document Released: 01/11/2005 Document Revised: 08/18/2013 Document Reviewed: 06/19/2011 [facility] Patient Information 2015 [place], [place]. This information is not intended to replace advice given to you by your health care provider. Make sure you discuss any questions you have with your health care provider.

## 2015-01-03 NOTE — ED Notes (Addendum)
Pt reports to the ED for eval of SOB and wheezing. She has hx of asthma. She reports she has been using her nebulizer at home and her inhaler but she lost her inhaler last night so she has not had it. Also reports possible insect bite with associated pain, erythema, and swelling to her right inner thigh. Wheezing noted throughout. Denies any CP, N/V/D, or sore throat. Pt A&Ox4 and skin warm and dry.

## 2015-01-03 NOTE — ED Provider Notes (Signed)
CSN: [id number]     Arrival date & time 01/03/15  1550 History   First MD Initiated Contact with Patient 01/03/15 1759     Chief Complaint  Patient presents with  . Asthma  . Insect Bite     (Consider location/radiation/quality/duration/timing/severity/associated sxs/prior Treatment) HPI   39 year old female with  Hx of asthma presents with 2 complaints. Patient notice a bump to her right medial thigh approximately 2 days ago. Since then she noticed increased redness and spreading warmth to her right thigh that is very tender to palpation. She did not recall any trauma or insect bite. The pain and redness has increased. Rate pain as moderate to severe. No specific treatment tried. No prior history of cellulitis or abscess. She does endorse chills, and not feeling well. Furthermore patient admits that she has history of asthma and has been expressing asthma exacerbation for the past several weeks. She normally uses albuterol inhaler along with a nebulizer. She was seen in the ED within the past month for asthma exacerbation. She accidentally lost her inhaler last night and request for a refill. She denies worsening shortness of breath, fever, productive cough, or hemoptysis.  Past Medical History  Diagnosis Date  . Asthma   . Bronchiectasis with acute exacerbation    History reviewed. No pertinent past surgical history. No family history on file. Social History  Substance Use Topics  . Smoking status: Former Games developer  . Smokeless tobacco: Never Used  . Alcohol Use: No   OB History    No data available     Review of Systems  All other systems reviewed and are negative.     Allergies  Review of patient's allergies indicates no known allergies.  Home Medications   Prior to Admission medications   Medication Sig Start Date End Date Taking? Authorizing Provider  albuterol (PROVENTIL HFA;VENTOLIN HFA) 108 (90 BASE) MCG/ACT inhaler Inhale 1-2 puffs into the lungs every 6 (six)  hours as needed for wheezing or shortness of breath. 12/14/14   Heather Laisure, PA-C  benzonatate (TESSALON) 100 MG capsule Take 1 capsule (100 mg total) by mouth every 8 (eight) hours. Patient not taking: Reported on 12/14/2014 10/05/14   Tatyana Kirichenko, PA-C  budesonide (PULMICORT) 180 MCG/ACT inhaler Inhale 2 puffs into the lungs every morning. Patient not taking: Reported on 10/05/2014 06/08/14   Linna Hoff, MD  minocycline (MINOCIN,DYNACIN) 100 MG capsule Take 1 capsule (100 mg total) by mouth 2 (two) times daily. Patient not taking: Reported on 10/05/2014 06/08/14   Linna Hoff, MD  predniSONE (DELTASONE) 20 MG tablet Take 3 tablets (60 mg total) by mouth daily. 12/14/14   Heather Laisure, PA-C   BP 121/73 mmHg  Pulse 110  Temp(Src) 99.3 F (37.4 C) (Oral)  Resp 16  SpO2 96%  LMP 12/17/2014 Physical Exam  Constitutional: She is oriented to person, place, and time. She appears well-developed and well-nourished. No distress.  HENT:  Head: Atraumatic.  Eyes: Conjunctivae are normal.  Neck: Neck supple.  Cardiovascular:  Mild tachycardia without murmurs or gallops.  Pulmonary/Chest:  Decreased breath sounds with faint expiratory wheezes. No rales or rhonchi heard. No accessory muscle use.  Neurological: She is alert and oriented to person, place, and time.  Skin: Rash (Right medial thigh: A 15 x 15 cm area of erythema, warmth, and mild induration region without fluctuance noted, ttp. No appreciable abscess.) noted.  Psychiatric: She has a normal mood and affect.  Nursing note and vitals reviewed.  ED Course  Procedures (including critical care time)  Patient was admitted developing right medial thigh cellulitis which will require antibiotic. The cellulitis was marked around the margin. She will be discharged with Keflex and Bactrim along with pain medication. She also ran out of her inhaler, therefore an albuterol inhaler was given in the ED. No hypoxia and chest x-ray show no  evidence of pneumonia. Patient is sent to return in 2 days for wound recheck if symptoms worsen.  Labs Review Labs Reviewed - No data to display  Imaging Review Dg Chest 2 View  01/03/2015   CLINICAL DATA:  Pt states she has had chest congestion, productive cough, and chills for 1 week; she states she has also been short of breath and had wheezing; she states she has h/o asthma and bronchitis; Former smoker  EXAM: CHEST  2 VIEW  COMPARISON:  11/24/2014  FINDINGS: Cardiac silhouette is normal in size and configuration. No mediastinal or hilar masses. No evidence of adenopathy.  Mild bronchial wall thickening centrally and in the right middle lobe and right lower lobe, similar to the prior exam consistent with chronic bronchitic change. Lungs otherwise clear. No pleural effusion or pneumothorax. Skeletal structures are unremarkable.  IMPRESSION: No acute cardiopulmonary disease.   Electronically Signed   By: Amie Portland M.D.   On: 01/03/2015 16:56   I have personally reviewed and evaluated these images and lab results as part of my medical decision-making.   EKG Interpretation None      MDM   Final diagnoses:  Cellulitis of right thigh  Asthma exacerbation attacks, mild intermittent    BP 123/79 mmHg  Pulse 95  Temp(Src) 98.6 F (37 C) (Oral)  Resp 16  SpO2 97%  LMP 12/17/2014     Fayrene Helper, PA-C 01/03/15 2234  Richardean Canal, MD 01/04/15 [phone]

## 2015-01-07 ENCOUNTER — Emergency Department (HOSPITAL_COMMUNITY)
Admission: EM | Admit: 2015-01-07 | Discharge: 2015-01-07 | Disposition: A | Discharge status: Home / Self Care | Payer: Medicaid Other | Attending: Emergency Medicine | Admitting: Emergency Medicine

## 2015-01-07 ENCOUNTER — Encounter (HOSPITAL_COMMUNITY): Payer: Self-pay | Admitting: Emergency Medicine

## 2015-01-07 DIAGNOSIS — Z87891 Personal history of nicotine dependence: Secondary | ICD-10-CM | POA: Diagnosis not present

## 2015-01-07 DIAGNOSIS — Z7952 Long term (current) use of systemic steroids: Secondary | ICD-10-CM | POA: Insufficient documentation

## 2015-01-07 DIAGNOSIS — Z792 Long term (current) use of antibiotics: Secondary | ICD-10-CM | POA: Diagnosis not present

## 2015-01-07 DIAGNOSIS — L03115 Cellulitis of right lower limb: Secondary | ICD-10-CM

## 2015-01-07 DIAGNOSIS — Z79899 Other long term (current) drug therapy: Secondary | ICD-10-CM | POA: Insufficient documentation

## 2015-01-07 DIAGNOSIS — J45901 Unspecified asthma with (acute) exacerbation: Secondary | ICD-10-CM | POA: Diagnosis not present

## 2015-01-07 DIAGNOSIS — R062 Wheezing: Secondary | ICD-10-CM | POA: Diagnosis present

## 2015-01-07 MED ORDER — ALBUTEROL SULFATE (2.5 MG/3ML) 0.083% IN NEBU
5.0000 mg | INHALATION_SOLUTION | Freq: Once | RESPIRATORY_TRACT | Status: AC
  Administered 2015-01-07: 5 mg via RESPIRATORY_TRACT
  Filled 2015-01-07: qty 6

## 2015-01-07 MED ORDER — PREDNISONE 20 MG PO TABS: 40.0000 mg | ORAL_TABLET | Freq: Every day | ORAL | Status: DC

## 2015-01-07 MED ORDER — PREDNISONE 20 MG PO TABS
60.0000 mg | ORAL_TABLET | Freq: Once | ORAL | Status: AC
  Administered 2015-01-07: 60 mg via ORAL
  Filled 2015-01-07: qty 3

## 2015-01-07 NOTE — ED Provider Notes (Signed)
CSN: [id number]     Arrival date & time 01/07/15  1218 History  This chart was scribed for non-physician practitioner, Teressa Lower, NP, working with Arby Barrette, MD by Freida Busman, ED Scribe. This patient was seen in room WTR6/WTR6 and the patient's care was started at 12:36 PM.    Chief Complaint  Patient presents with  . Wheezing   The history is provided by the patient. No language interpreter was used.     HPI Comments:  Wendy Morgan is a 39 y.o. female with a history of asthma, who presents to the Emergency Department complaining of wheezing for a few days. She reports associated dry cough and subjective fever. Pt has been using her inhaler with minimal relief. Symptoms today are similar to past asthma exacerbations.   Pt also reports an area of redness to her right groin. She had the area evaluated 4 days ago and was placed on keflex and bactrim which she has been complaint with. At this time she would like the area re-evaluated.    Past Medical History  Diagnosis Date  . Asthma   . Bronchiectasis with acute exacerbation    History reviewed. No pertinent past surgical history. No family history on file. Social History  Substance Use Topics  . Smoking status: Former Games developer  . Smokeless tobacco: Never Used  . Alcohol Use: No   OB History    No data available     Review of Systems  Constitutional: Positive for fever.  Respiratory: Positive for cough and wheezing.   Skin: Positive for color change.  All other systems reviewed and are negative.     Allergies  Review of patient's allergies indicates no known allergies.  Home Medications   Prior to Admission medications   Medication Sig Start Date End Date Taking? Authorizing Provider  albuterol (PROVENTIL HFA;VENTOLIN HFA) 108 (90 BASE) MCG/ACT inhaler Inhale 1-2 puffs into the lungs every 6 (six) hours as needed for wheezing or shortness of breath. 12/14/14   Heather Laisure, PA-C  benzonatate  (TESSALON) 100 MG capsule Take 1 capsule (100 mg total) by mouth every 8 (eight) hours. Patient not taking: Reported on 12/14/2014 10/05/14   Tatyana Kirichenko, PA-C  budesonide (PULMICORT) 180 MCG/ACT inhaler Inhale 2 puffs into the lungs every morning. Patient not taking: Reported on 10/05/2014 06/08/14   Linna Hoff, MD  cephALEXin (KEFLEX) 500 MG capsule Take 2 capsules (1,000 mg total) by mouth 2 (two) times daily. 01/03/15   Fayrene Helper, PA-C  HYDROcodone-acetaminophen (NORCO/VICODIN) 5-325 MG per tablet Take 1 tablet by mouth every 6 (six) hours as needed for moderate pain. 01/03/15   Fayrene Helper, PA-C  minocycline (MINOCIN,DYNACIN) 100 MG capsule Take 1 capsule (100 mg total) by mouth 2 (two) times daily. Patient not taking: Reported on 10/05/2014 06/08/14   Linna Hoff, MD  predniSONE (DELTASONE) 20 MG tablet Take 3 tablets (60 mg total) by mouth daily. 12/14/14   Heather Laisure, PA-C  sulfamethoxazole-trimethoprim (BACTRIM DS,SEPTRA DS) 800-160 MG per tablet Take 1 tablet by mouth 2 (two) times daily. 01/03/15 01/10/15  Fayrene Helper, PA-C   BP 123/92 mmHg  Pulse 90  Temp(Src) 98.2 F (36.8 C) (Oral)  Resp 20  SpO2 96%  LMP 12/17/2014 Physical Exam  Constitutional: She is oriented to person, place, and time. She appears well-developed and well-nourished.  HENT:  Head: Normocephalic and atraumatic.  Right Ear: External ear normal.  Left Ear: External ear normal.  Mouth/Throat: Posterior oropharyngeal erythema present.  Cardiovascular:  Normal rate and regular rhythm.   Pulmonary/Chest: Effort normal. She has wheezes.  Abdominal: Soft. Bowel sounds are normal.  Musculoskeletal: Normal range of motion.  Neurological: She is alert and oriented to person, place, and time.  Skin:     Nursing note and vitals reviewed.   ED Course  Procedures   DIAGNOSTIC STUDIES:  Oxygen Saturation is 96% on RA, normal by my interpretation.    COORDINATION OF CARE:  12:40 PM Discussed treatment  plan with pt at bedside and pt agreed to plan.  Labs Review Labs Reviewed - No data to display  Imaging Review No results found. I have personally reviewed and evaluated these images and lab results as part of my medical decision-making.   EKG Interpretation None      MDM   Final diagnoses:  Asthma exacerbation  Cellulitis of right lower extremity    Pt no longer wheezing after treatment. Pt given prednisone. Discussed with pt that worth seeing pcp to change regimen as she seems to be having frequent exacerbations  I personally performed the services described in this documentation, which was scribed in my presence. The recorded information has been reviewed and is accurate.    Teressa Lower, NP 01/07/15 1315  Arby Barrette, MD 01/12/15 0800

## 2015-01-07 NOTE — ED Notes (Signed)
Per pt, states she is wheezing and has chest congestion

## 2015-01-07 NOTE — Discharge Instructions (Signed)
Follow up with your doctor as discussed Asthma Attack Prevention Although there is no way to prevent asthma from starting, you can take steps to control the disease and reduce its symptoms. Learn about your asthma and how to control it. Take an active role to control your asthma by working with your health care provider to create and follow an asthma action plan. An asthma action plan guides you in:  Taking your medicines properly.  Avoiding things that set off your asthma or make your asthma worse (asthma triggers).  Tracking your level of asthma control.  Responding to worsening asthma.  Seeking emergency care when needed. To track your asthma, keep records of your symptoms, check your peak flow number using a handheld device that shows how well air moves out of your lungs (peak flow meter), and get regular asthma checkups.  WHAT ARE SOME WAYS TO PREVENT AN ASTHMA ATTACK?  Take medicines as directed by your health care provider.  Keep track of your asthma symptoms and level of control.  With your health care provider, write a detailed plan for taking medicines and managing an asthma attack. Then be sure to follow your action plan. Asthma is an ongoing condition that needs regular monitoring and treatment.  Identify and avoid asthma triggers. Many outdoor allergens and irritants (such as pollen, mold, cold air, and air pollution) can trigger asthma attacks. Find out what your asthma triggers are and take steps to avoid them.  Monitor your breathing. Learn to recognize warning signs of an attack, such as coughing, wheezing, or shortness of breath. Your lung function may decrease before you notice any signs or symptoms, so regularly measure and record your peak airflow with a home peak flow meter.  Identify and treat attacks early. If you act quickly, you are less likely to have a severe attack. You will also need less medicine to control your symptoms. When your peak flow measurements  decrease and alert you to an upcoming attack, take your medicine as instructed and immediately stop any activity that may have triggered the attack. If your symptoms do not improve, get medical help.  Pay attention to increasing quick-relief inhaler use. If you find yourself relying on your quick-relief inhaler, your asthma is not under control. See your health care provider about adjusting your treatment. WHAT CAN MAKE MY SYMPTOMS WORSE? A number of common things can set off or make your asthma symptoms worse and cause temporary increased inflammation of your airways. Keep track of your asthma symptoms for several weeks, detailing all the environmental and emotional factors that are linked with your asthma. When you have an asthma attack, go back to your asthma diary to see which factor, or combination of factors, might have contributed to it. Once you know what these factors are, you can take steps to control many of them. If you have allergies and asthma, it is important to take asthma prevention steps at home. Minimizing contact with the substance to which you are allergic will help prevent an asthma attack. Some triggers and ways to avoid these triggers are: Animal Dander:  Some people are allergic to the flakes of skin or dried saliva from animals with fur or feathers.   There is no such thing as a hypoallergenic dog or cat breed. All dogs or cats can cause allergies, even if they don't shed.  Keep these pets out of your home.  If you are not able to keep a pet outdoors, keep the pet out of your  bedroom and other sleeping areas at all times, and keep the door closed.  Remove carpets and furniture covered with cloth from your home. If that is not possible, keep the pet away from fabric-covered furniture and carpets. Dust Mites: Many people with asthma are allergic to dust mites. Dust mites are tiny bugs that are found in every home in mattresses, pillows, carpets, fabric-covered furniture,  bedcovers, clothes, stuffed toys, and other fabric-covered items.   Cover your mattress in a special dust-proof cover.  Cover your pillow in a special dust-proof cover, or wash the pillow each week in hot water. Water must be hotter than 130 F (54.4 C) to kill dust mites. Cold or warm water used with detergent and bleach can also be effective.  Wash the sheets and blankets on your bed each week in hot water.  Try not to sleep or lie on cloth-covered cushions.  Call ahead when traveling and ask for a smoke-free hotel room. Bring your own bedding and pillows in case the hotel only supplies feather pillows and down comforters, which may contain dust mites and cause asthma symptoms.  Remove carpets from your bedroom and those laid on concrete, if you can.  Keep stuffed toys out of the bed, or wash the toys weekly in hot water or cooler water with detergent and bleach. Cockroaches: Many people with asthma are allergic to the droppings and remains of cockroaches.   Keep food and garbage in closed containers. Never leave food out.  Use poison baits, traps, powders, gels, or paste (for example, boric acid).  If a spray is used to kill cockroaches, stay out of the room until the odor goes away. Indoor Mold:  Fix leaky faucets, pipes, or other sources of water that have mold around them.  Clean floors and moldy surfaces with a fungicide or diluted bleach.  Avoid using humidifiers, vaporizers, or swamp coolers. These can spread molds through the air. Pollen and Outdoor Mold:  When pollen or mold spore counts are high, try to keep your windows closed.  Stay indoors with windows closed from late morning to afternoon. Pollen and some mold spore counts are highest at that time.  Ask your health care provider whether you need to take anti-inflammatory medicine or increase your dose of the medicine before your allergy season starts. Other Irritants to Avoid:  Tobacco smoke is an irritant. If  you smoke, ask your health care provider how you can quit. Ask family members to quit smoking, too. Do not allow smoking in your home or car.  If possible, do not use a wood-burning stove, kerosene heater, or fireplace. Minimize exposure to all sources of smoke, including incense, candles, fires, and fireworks.  Try to stay away from strong odors and sprays, such as perfume, talcum powder, hair spray, and paints.  Decrease humidity in your home and use an indoor air cleaning device. Reduce indoor humidity to below 60%. Dehumidifiers or central air conditioners can do this.  Decrease house dust exposure by changing furnace and air cooler filters frequently.  Try to have someone else vacuum for you once or twice a week. Stay out of rooms while they are being vacuumed and for a short while afterward.  If you vacuum, use a dust mask from a hardware store, a double-layered or microfilter vacuum cleaner bag, or a vacuum cleaner with a HEPA filter.  Sulfites in foods and beverages can be irritants. Do not drink beer or wine or eat dried fruit, processed potatoes, or shrimp  if they cause asthma symptoms.  Cold air can trigger an asthma attack. Cover your nose and mouth with a scarf on cold or windy days.  Several health conditions can make asthma more difficult to manage, including a runny nose, sinus infections, reflux disease, psychological stress, and sleep apnea. Work with your health care provider to manage these conditions.  Avoid close contact with people who have a respiratory infection such as a cold or the flu, since your asthma symptoms may get worse if you catch the infection. Wash your hands thoroughly after touching items that may have been handled by people with a respiratory infection.  Get a flu shot every year to protect against the flu virus, which often makes asthma worse for days or weeks. Also get a pneumonia shot if you have not previously had one. Unlike the flu shot, the  pneumonia shot does not need to be given yearly. Medicines:  Talk to your health care provider about whether it is safe for you to take aspirin or non-steroidal anti-inflammatory medicines (NSAIDs). In a small number of people with asthma, aspirin and NSAIDs can cause asthma attacks. These medicines must be avoided by people who have known aspirin-sensitive asthma. It is important that people with aspirin-sensitive asthma read labels of all over-the-counter medicines used to treat pain, colds, coughs, and fever.  Beta-blockers and ACE inhibitors are other medicines you should discuss with your health care provider. HOW CAN I FIND OUT WHAT I AM ALLERGIC TO? Ask your asthma health care provider about allergy skin testing or blood testing (the RAST test) to identify the allergens to which you are sensitive. If you are found to have allergies, the most important thing to do is to try to avoid exposure to any allergens that you are sensitive to as much as possible. Other treatments for allergies, such as medicines and allergy shots (immunotherapy) are available.  CAN I EXERCISE? Follow your health care provider's advice regarding asthma treatment before exercising. It is important to maintain a regular exercise program, but vigorous exercise or exercise in cold, humid, or dry environments can cause asthma attacks, especially for those people who have exercise-induced asthma. Document Released: 03/22/2009 Document Revised: 04/08/2013 Document Reviewed: 10/09/2012 [facility] Patient Information 2015 [place], [place]. This information is not intended to replace advice given to you by your health care provider. Make sure you discuss any questions you have with your health care provider.

## 2015-02-04 ENCOUNTER — Emergency Department (HOSPITAL_COMMUNITY)
Admission: EM | Admit: 2015-02-04 | Discharge: 2015-02-04 | Disposition: A | Discharge status: Home / Self Care | Payer: Medicaid Other | Attending: Physician Assistant | Admitting: Physician Assistant

## 2015-02-04 ENCOUNTER — Encounter (HOSPITAL_COMMUNITY): Payer: Self-pay | Admitting: Neurology

## 2015-02-04 DIAGNOSIS — J45901 Unspecified asthma with (acute) exacerbation: Secondary | ICD-10-CM | POA: Insufficient documentation

## 2015-02-04 DIAGNOSIS — J069 Acute upper respiratory infection, unspecified: Secondary | ICD-10-CM | POA: Insufficient documentation

## 2015-02-04 DIAGNOSIS — Z7952 Long term (current) use of systemic steroids: Secondary | ICD-10-CM | POA: Diagnosis not present

## 2015-02-04 DIAGNOSIS — R1110 Vomiting, unspecified: Secondary | ICD-10-CM | POA: Insufficient documentation

## 2015-02-04 DIAGNOSIS — Z87891 Personal history of nicotine dependence: Secondary | ICD-10-CM | POA: Insufficient documentation

## 2015-02-04 DIAGNOSIS — Z792 Long term (current) use of antibiotics: Secondary | ICD-10-CM | POA: Insufficient documentation

## 2015-02-04 DIAGNOSIS — R05 Cough: Secondary | ICD-10-CM | POA: Diagnosis present

## 2015-02-04 MED ORDER — IPRATROPIUM BROMIDE 0.02 % IN SOLN
0.5000 mg | Freq: Once | RESPIRATORY_TRACT | Status: AC
  Administered 2015-02-04: 0.5 mg via RESPIRATORY_TRACT
  Filled 2015-02-04: qty 2.5

## 2015-02-04 MED ORDER — PREDNISONE 10 MG PO TABS: 20.0000 mg | ORAL_TABLET | Freq: Every day | ORAL | Status: DC

## 2015-02-04 MED ORDER — ALBUTEROL SULFATE (2.5 MG/3ML) 0.083% IN NEBU
5.0000 mg | INHALATION_SOLUTION | Freq: Once | RESPIRATORY_TRACT | Status: AC
  Administered 2015-02-04: 5 mg via RESPIRATORY_TRACT
  Filled 2015-02-04: qty 6

## 2015-02-04 MED ORDER — PREDNISONE 20 MG PO TABS
60.0000 mg | ORAL_TABLET | Freq: Once | ORAL | Status: AC
  Administered 2015-02-04: 60 mg via ORAL
  Filled 2015-02-04: qty 3

## 2015-02-04 MED ORDER — AZITHROMYCIN 250 MG PO TABS: 250.0000 mg | ORAL_TABLET | Freq: Every day | ORAL | Status: DC

## 2015-02-04 NOTE — ED Notes (Signed)
PT reports she has had 8 Nebulizer treatment in the last 24 hrs. Pt reports no relief. Pt wheezing on arrival to room.

## 2015-02-04 NOTE — ED Notes (Signed)
Pt reports wheezing for 1 week, has been using inhalers and isn't working. Pt is a x 4. Speech clear and concise. 96% RA.

## 2015-02-04 NOTE — ED Provider Notes (Signed)
CSN: [id number]     Arrival date & time 02/04/15  0950 History  By signing my name below, I, Wendy Morgan, attest that this documentation has been prepared under the direction and in the presence of Marlon Peliffany Kharlie Bring, PA-C Electronically Signed: Jarvis Morganaylor Morgan, ED Scribe. 02/04/2015. 11:16 AM.    Chief Complaint  Patient presents with  . Asthma   The history is provided by the patient. No language interpreter was used.    HPI Comments: Wendy Morgan is a 39 y.o. female with a h/o asthma who presents to the Emergency Department complaining of a gradually worsening, asthma exacerbation onset 1 week. She reports associated wheezing, chest tightness, and intermittent dry cough. She notes she has also been having intermittent post tussive emesis. Pt has tried multiple nebulizer treatments at home in the last 24 hours with no significant relief; her last treatment was 4 hours ago. She notes her nebulizer medication was just refilled last month. Pt endorses that she has an asthma flare up like this almost every month. She is unsure what triggers her asthma exacerbation but admits this is different with the coughing and nasal congestion. She denies any fever, chills, nausea or vomiting.     Past Medical History  Diagnosis Date  . Asthma   . Bronchiectasis with acute exacerbation (HCC)    History reviewed. No pertinent past surgical history. No family history on file. Social History  Substance Use Topics  . Smoking status: Former Games developermoker  . Smokeless tobacco: Never Used  . Alcohol Use: No   OB History    No data available     Review of Systems  Constitutional: Negative for fever and chills.  Respiratory: Positive for cough, chest tightness and wheezing.   Gastrointestinal: Negative for nausea and vomiting.      Allergies  Review of patient's allergies indicates no known allergies.  Home Medications   Prior to Admission medications   Medication Sig Start Date End Date Taking?  Authorizing Provider  albuterol (PROVENTIL HFA;VENTOLIN HFA) 108 (90 BASE) MCG/ACT inhaler Inhale 1-2 puffs into the lungs every 6 (six) hours as needed for wheezing or shortness of breath. 12/14/14   Santiago GladHeather Laisure, PA-C  azithromycin (ZITHROMAX) 250 MG tablet Take 1 tablet (250 mg total) by mouth daily. Take first 2 tablets together, then 1 every day until finished. 02/04/15   Lavayah Vita Neva SeatGreene, PA-C  benzonatate (TESSALON) 100 MG capsule Take 1 capsule (100 mg total) by mouth every 8 (eight) hours. Patient not taking: Reported on 12/14/2014 10/05/14   Tatyana Kirichenko, PA-C  budesonide (PULMICORT) 180 MCG/ACT inhaler Inhale 2 puffs into the lungs every morning. Patient not taking: Reported on 10/05/2014 06/08/14   Linna HoffJames D Kindl, MD  cephALEXin (KEFLEX) 500 MG capsule Take 2 capsules (1,000 mg total) by mouth 2 (two) times daily. 01/03/15   Fayrene HelperBowie Tran, PA-C  HYDROcodone-acetaminophen (NORCO/VICODIN) 5-325 MG per tablet Take 1 tablet by mouth every 6 (six) hours as needed for moderate pain. 01/03/15   Fayrene HelperBowie Tran, PA-C  minocycline (MINOCIN,DYNACIN) 100 MG capsule Take 1 capsule (100 mg total) by mouth 2 (two) times daily. Patient not taking: Reported on 10/05/2014 06/08/14   Linna HoffJames D Kindl, MD  predniSONE (DELTASONE) 10 MG tablet Take 2 tablets (20 mg total) by mouth daily. 02/04/15   Aby Gessel Neva SeatGreene, PA-C  predniSONE (DELTASONE) 20 MG tablet Take 2 tablets (40 mg total) by mouth daily. 01/07/15   Teressa LowerVrinda Pickering, NP   Triage Vitals: BP 121/87 mmHg  Pulse 70  Temp(Src)  98 F (36.7 C) (Oral)  Resp 18  SpO2 96%  LMP 01/10/2015  Physical Exam  Constitutional: She is oriented to person, place, and time. She appears well-developed and well-nourished. No distress.  HENT:  Head: Normocephalic and atraumatic.  Right Ear: Tympanic membrane normal.  Left Ear: Tympanic membrane normal.  Mouth/Throat: Uvula is midline, oropharynx is clear and moist and mucous membranes are normal.  Eyes: Conjunctivae and EOM  are normal.  Neck: Neck supple. No tracheal deviation present.  Cardiovascular: Normal rate, regular rhythm and normal heart sounds.   No lower extremity swelling  Pulmonary/Chest: Effort normal. No accessory muscle usage. No tachypnea and no bradypnea. No respiratory distress. Decreased breath sounds: mild. She has wheezes (worse in lower lung bases).  Abdominal: Soft. There is no tenderness.  Musculoskeletal: Normal range of motion.  Neurological: She is alert and oriented to person, place, and time.  Skin: Skin is warm and dry.  Psychiatric: She has a normal mood and affect. Her behavior is normal.  Nursing note and vitals reviewed.   ED Course  Procedures (including critical care time)  DIAGNOSTIC STUDIES: Oxygen Saturation is 96% on RA, normal by my interpretation.    COORDINATION OF CARE:    Labs Review Labs Reviewed - No data to display  Imaging Review No results found. I have personally reviewed and evaluated these images and lab results as part of my medical decision-making.   EKG Interpretation None      MDM   Final diagnoses:  URI (upper respiratory infection)  Asthma exacerbation    Significant improvement after breathing treatment and steroids. She is 96% oxygen saturation on room air, no signs of resp distress or increased effort.  Rx: Prednisone and Azithromycin. Cont home nebs q 4-6 hours. F/u with PCP  Medications  albuterol (PROVENTIL) (2.5 MG/3ML) 0.083% nebulizer solution 5 mg (5 mg Nebulization Given 02/04/15 1033)  ipratropium (ATROVENT) nebulizer solution 0.5 mg (0.5 mg Nebulization Given 02/04/15 1033)  predniSONE (DELTASONE) tablet 60 mg (60 mg Oral Given 02/04/15 1032)    39 y.o.Wendy Morgan Overholt's medical screening exam was performed and I feel the patient has had an appropriate workup for their chief complaint at this time and likelihood of emergent condition existing is low. They have been counseled on decision, discharge, follow up and  which symptoms necessitate immediate return to the emergency department. They or their family verbally stated understanding and agreement with plan and discharged in stable condition.   Vital signs are stable at discharge. Filed Vitals:   02/04/15 1004  BP: 121/87  Pulse: 70  Temp: 98 F (36.7 C)  Resp: 18    I personally performed the services described in this documentation, which was scribed in my presence. The recorded information has been reviewed and is accurate.   Marlon Pel, PA-C 02/04/15 1117  Courteney Lyn Mackuen, MD 02/04/15 1621

## 2015-02-04 NOTE — ED Notes (Signed)
Las tneb treatment was at 0600

## 2015-02-04 NOTE — Discharge Instructions (Signed)

## 2015-02-04 NOTE — ED Notes (Signed)
Declined W/C at D/C and was escorted to lobby by RN. 

## 2015-02-16 ENCOUNTER — Encounter (HOSPITAL_COMMUNITY): Payer: Self-pay | Admitting: Emergency Medicine

## 2015-02-16 ENCOUNTER — Emergency Department (HOSPITAL_COMMUNITY)
Admission: EM | Admit: 2015-02-16 | Discharge: 2015-02-16 | Disposition: A | Discharge status: Home / Self Care | Payer: Medicaid Other | Attending: Emergency Medicine | Admitting: Emergency Medicine

## 2015-02-16 ENCOUNTER — Emergency Department (HOSPITAL_COMMUNITY): Payer: Medicaid Other

## 2015-02-16 DIAGNOSIS — Z87891 Personal history of nicotine dependence: Secondary | ICD-10-CM | POA: Diagnosis not present

## 2015-02-16 DIAGNOSIS — Z79899 Other long term (current) drug therapy: Secondary | ICD-10-CM | POA: Insufficient documentation

## 2015-02-16 DIAGNOSIS — J45909 Unspecified asthma, uncomplicated: Secondary | ICD-10-CM | POA: Diagnosis present

## 2015-02-16 DIAGNOSIS — J45901 Unspecified asthma with (acute) exacerbation: Secondary | ICD-10-CM | POA: Diagnosis not present

## 2015-02-16 MED ORDER — IPRATROPIUM-ALBUTEROL 0.5-2.5 (3) MG/3ML IN SOLN
RESPIRATORY_TRACT | Status: AC
  Filled 2015-02-16: qty 3

## 2015-02-16 MED ORDER — IPRATROPIUM-ALBUTEROL 0.5-2.5 (3) MG/3ML IN SOLN
3.0000 mL | Freq: Once | RESPIRATORY_TRACT | Status: AC
  Administered 2015-02-16: 3 mL via RESPIRATORY_TRACT
  Filled 2015-02-16: qty 3

## 2015-02-16 MED ORDER — METHYLPREDNISOLONE SODIUM SUCC 125 MG IJ SOLR
125.0000 mg | Freq: Once | INTRAMUSCULAR | Status: AC
  Administered 2015-02-16: 125 mg via INTRAVENOUS
  Filled 2015-02-16: qty 2

## 2015-02-16 MED ORDER — ALBUTEROL SULFATE HFA 108 (90 BASE) MCG/ACT IN AERS: 2.0000 | INHALATION_SPRAY | RESPIRATORY_TRACT | Status: DC | PRN

## 2015-02-16 MED ORDER — PREDNISONE 10 MG PO TABS: 20.0000 mg | ORAL_TABLET | Freq: Every day | ORAL | Status: DC

## 2015-02-16 MED ORDER — ALBUTEROL (5 MG/ML) CONTINUOUS INHALATION SOLN
15.0000 mg/h | INHALATION_SOLUTION | Freq: Once | RESPIRATORY_TRACT | Status: AC
  Administered 2015-02-16: 15 mg/h via RESPIRATORY_TRACT
  Filled 2015-02-16: qty 20

## 2015-02-16 MED ORDER — SODIUM CHLORIDE 0.9 % IV SOLN
INTRAVENOUS | Status: DC
  Administered 2015-02-16: 03:00:00 via INTRAVENOUS

## 2015-02-16 NOTE — ED Notes (Signed)
E-sig not working, pt verbalized understanding of DC instructions and prescriptions 

## 2015-02-16 NOTE — Discharge Instructions (Signed)

## 2015-02-16 NOTE — ED Notes (Signed)
Pt reports asthma flare. Has been tx with antibiotics for URI but she has not gotten better. Apt with pcp tomorrow but could not wait. Pt with exp wheezes

## 2015-02-16 NOTE — ED Provider Notes (Signed)
TIME SEEN: 2:27 AM  CHIEF COMPLAINT: Asthma  HPI:  HPI Comments: Wendy Morgan is a 39 y.o. female, with a PMhx of asthma, who presents to the Emergency Department complaining of an acute exacerbation of asthma significant for wheezing and chest tightness that woke her up from sleep this evening prompting ED evaluation. She was recently prescribed a prednisone course and azithromycin on discharge from last ED visit 11 days ago for a cough, wheezing, and chest tightness. She reports worsening of her cough with production of green sputum despite these medications and notes she finished the steroid course 1 week ago. She has an appointment with her PCP tomorrow for her ongoing URI symptoms. Denies fevers. No h/o smoking. No PMhx of PE/DVT.   ROS: See HPI Constitutional: no fever  Eyes: no drainage  ENT: no runny nose   Cardiovascular:   chest pain  Resp:  SOB  GI: no vomiting GU: no dysuria Integumentary: no rash  Allergy: no hives  Musculoskeletal: no leg swelling  Neurological: no slurred speech ROS otherwise negative  PAST MEDICAL HISTORY/PAST SURGICAL HISTORY:  Past Medical History  Diagnosis Date  . Asthma   . Bronchiectasis with acute exacerbation (HCC)     MEDICATIONS:  Prior to Admission medications   Medication Sig Start Date End Date Taking? Authorizing Provider  albuterol (PROVENTIL HFA;VENTOLIN HFA) 108 (90 BASE) MCG/ACT inhaler Inhale 1-2 puffs into the lungs every 6 (six) hours as needed for wheezing or shortness of breath. 12/14/14   Santiago GladHeather Laisure, PA-C  azithromycin (ZITHROMAX) 250 MG tablet Take 1 tablet (250 mg total) by mouth daily. Take first 2 tablets together, then 1 every day until finished. 02/04/15   Tiffany Neva SeatGreene, PA-C  benzonatate (TESSALON) 100 MG capsule Take 1 capsule (100 mg total) by mouth every 8 (eight) hours. Patient not taking: Reported on 12/14/2014 10/05/14   Tatyana Kirichenko, PA-C  budesonide (PULMICORT) 180 MCG/ACT inhaler Inhale 2 puffs  into the lungs every morning. Patient not taking: Reported on 10/05/2014 06/08/14   Linna HoffJames D Kindl, MD  cephALEXin (KEFLEX) 500 MG capsule Take 2 capsules (1,000 mg total) by mouth 2 (two) times daily. 01/03/15   Fayrene HelperBowie Tran, PA-C  HYDROcodone-acetaminophen (NORCO/VICODIN) 5-325 MG per tablet Take 1 tablet by mouth every 6 (six) hours as needed for moderate pain. 01/03/15   Fayrene HelperBowie Tran, PA-C  minocycline (MINOCIN,DYNACIN) 100 MG capsule Take 1 capsule (100 mg total) by mouth 2 (two) times daily. Patient not taking: Reported on 10/05/2014 06/08/14   Linna HoffJames D Kindl, MD  predniSONE (DELTASONE) 10 MG tablet Take 2 tablets (20 mg total) by mouth daily. 02/04/15   Tiffany Neva SeatGreene, PA-C  predniSONE (DELTASONE) 20 MG tablet Take 2 tablets (40 mg total) by mouth daily. 01/07/15   Teressa LowerVrinda Pickering, NP    ALLERGIES:  No Known Allergies  SOCIAL HISTORY:  Social History  Substance Use Topics  . Smoking status: Former Games developermoker  . Smokeless tobacco: Never Used  . Alcohol Use: No    FAMILY HISTORY: No family history on file.  EXAM: BP 116/74 mmHg  Pulse 85  Temp(Src) 98 F (36.7 C) (Oral)  Resp 20  Ht 5\' 4"  (1.626 m)  Wt 140 lb (63.504 kg)  BMI 24.02 kg/m2  SpO2 93%  LMP 01/10/2015 CONSTITUTIONAL: Alert and oriented and responds appropriately to questions. Well-appearing; well-nourished HEAD: Normocephalic EYES: Conjunctivae clear, PERRL ENT: normal nose; no rhinorrhea; moist mucous membranes; pharynx without lesions noted NECK: Supple, no meningismus, no LAD  CARD: RRR; S1 and  S2 appreciated; no murmurs, no clicks, no rubs, no gallops RESP: Normal chest excursion without splinting; slightly tachypneic; breath sounds equal bilaterally; diffuse expiratory wheezing and coarse breath sounds bilaterally, no rhonchi, no rales, no hypoxia or respiratory distress, speaking full sentences ABD/GI: Normal bowel sounds; non-distended; soft, non-tender, no rebound, no guarding, no peritoneal signs BACK:  The back  appears normal and is non-tender to palpation, there is no CVA tenderness EXT: Normal ROM in all joints; non-tender to palpation; no edema; normal capillary refill; no cyanosis, no calf tenderness or swelling    SKIN: Normal color for age and race; warm NEURO: Moves all extremities equally, sensation to light touch intact diffusely, cranial nerves II through XII intact PSYCH: The patient's mood and manner are appropriate. Grooming and personal hygiene are appropriate.  MEDICAL DECISION MAKING: Patient here with asthma exacerbation. Reports persistent cough with green sputum production but no fever. Recently finished azithromycin. She is wheezing today but in no respiratory distress. Minimally tachypneic but not hypoxic. We'll give continuous albuterol, slight Medrol. We'll obtain chest x-ray, EKG.  ED PROGRESS: Patient's lungs are almost completely clear after continuous albuterol and 2 duo nebs. Reports feeling much better. Had one brief episode of hypoxia but now is able to ambulate and her oxygen saturation does not drop below 95%. I feel she is safe to be discharged home. We'll discharge her on a longer steroid taper never commit close outpatient follow-up. She states she has plenty of albuterol for her nebulizer machine. We'll also prescribe another prescription for albuterol inhaler.   I do not feel there is any life-threatening condition present. Discussed all results, exam findings with patient. I feel the patient is safe to be discharged home without further emergent workup. Discussed usual and customary return precautions. Patient and family (if present) verbalize understanding and are comfortable with this plan.  Patient will follow-up with their primary care provider. If they do not have a primary care provider, information for follow-up has been provided to them. All questions have been answered.    Date: 02/16/2015 3:43 AM  Rate: 87  Rhythm: normal sinus rhythm  QRS Axis: normal   Intervals: normal  ST/T Wave abnormalities: normal  Conduction Disutrbances: none  Narrative Interpretation: unremarkable    CRITICAL CARE Performed by: Raelyn Number   Total critical care time: 35 minutes - continuous albuterol, multiple DuoNebs, IV steroids  Critical care time was exclusive of separately billable procedures and treating other patients.  Critical care was necessary to treat or prevent imminent or life-threatening deterioration.  Critical care was time spent personally by me on the following activities: development of treatment plan with patient and/or surrogate as well as nursing, discussions with consultants, evaluation of patient's response to treatment, examination of patient, obtaining history from patient or surrogate, ordering and performing treatments and interventions, ordering and review of laboratory studies, ordering and review of radiographic studies, pulse oximetry and re-evaluation of patient's condition.    I personally performed the services described in this documentation, which was scribed in my presence. The recorded information has been reviewed and is accurate.    Layla Maw Lessie Funderburke, DO 02/16/15 [phone]

## 2015-02-16 NOTE — ED Notes (Signed)
Pt O2 sats remained 96-97% during ambulation. Pt reported no feeling of fatigue, dizziness, SOB etc. EDP notified

## 2015-03-07 ENCOUNTER — Emergency Department (HOSPITAL_COMMUNITY)
Admission: EM | Admit: 2015-03-07 | Discharge: 2015-03-07 | Disposition: A | Discharge status: Home / Self Care | Payer: Medicaid Other | Source: Home / Self Care

## 2015-03-07 ENCOUNTER — Encounter (HOSPITAL_COMMUNITY): Payer: Self-pay | Admitting: Emergency Medicine

## 2015-03-07 DIAGNOSIS — J45901 Unspecified asthma with (acute) exacerbation: Secondary | ICD-10-CM | POA: Diagnosis not present

## 2015-03-07 MED ORDER — METHYLPREDNISOLONE SODIUM SUCC 125 MG IJ SOLR
INTRAMUSCULAR | Status: AC
  Filled 2015-03-07: qty 2

## 2015-03-07 MED ORDER — METHYLPREDNISOLONE SODIUM SUCC 125 MG IJ SOLR
125.0000 mg | Freq: Once | INTRAMUSCULAR | Status: AC
  Administered 2015-03-07: 125 mg via INTRAMUSCULAR

## 2015-03-07 MED ORDER — ALBUTEROL SULFATE (2.5 MG/3ML) 0.083% IN NEBU
5.0000 mg | INHALATION_SOLUTION | Freq: Once | RESPIRATORY_TRACT | Status: AC
  Administered 2015-03-07: 5 mg via RESPIRATORY_TRACT

## 2015-03-07 MED ORDER — IPRATROPIUM-ALBUTEROL 0.5-2.5 (3) MG/3ML IN SOLN
RESPIRATORY_TRACT | Status: AC
  Filled 2015-03-07: qty 3

## 2015-03-07 MED ORDER — IPRATROPIUM-ALBUTEROL 0.5-2.5 (3) MG/3ML IN SOLN
3.0000 mL | Freq: Once | RESPIRATORY_TRACT | Status: AC
  Administered 2015-03-07: 3 mL via RESPIRATORY_TRACT

## 2015-03-07 MED ORDER — AMOXICILLIN 500 MG PO CAPS: 500.0000 mg | ORAL_CAPSULE | Freq: Three times a day (TID) | ORAL | Status: DC

## 2015-03-07 MED ORDER — ALBUTEROL SULFATE (2.5 MG/3ML) 0.083% IN NEBU
INHALATION_SOLUTION | RESPIRATORY_TRACT | Status: AC
  Filled 2015-03-07: qty 3

## 2015-03-07 MED ORDER — PREDNISONE 10 MG PO TABS: ORAL_TABLET | ORAL | Status: DC

## 2015-03-07 NOTE — Discharge Instructions (Signed)
Asthma, Adult Asthma is a condition of the lungs in which the airways tighten and narrow. Asthma can make it hard to breathe. Asthma cannot be cured, but medicine and lifestyle changes can help control it. Asthma may be started (triggered) by:  Animal skin flakes (dander).  Dust.  Cockroaches.  Pollen.  Mold.  Smoke.  Cleaning products.  Hair sprays or aerosol sprays.  Paint fumes or strong smells.  Cold air, weather changes, and winds.  Crying or laughing hard.  Stress.  Certain medicines or drugs.  Foods, such as dried fruit, potato chips, and sparkling grape juice.  Infections or conditions (colds, flu).  Exercise.  Certain medical conditions or diseases.  Exercise or tiring activities. HOME CARE   Take medicine as told by your doctor.  Use a peak flow meter as told by your doctor. A peak flow meter is a tool that measures how well the lungs are working.  Record and keep track of the peak flow meter's readings.  Understand and use the asthma action plan. An asthma action plan is a written plan for taking care of your asthma and treating your attacks.  To help prevent asthma attacks:  Do not smoke. Stay away from secondhand smoke.  Change your heating and air conditioning filter often.  Limit your use of fireplaces and wood stoves.  Get rid of pests (such as roaches and mice) and their droppings.  Throw away plants if you see mold on them.  Clean your floors. Dust regularly. Use cleaning products that do not smell.  Have someone vacuum when you are not home. Use a vacuum cleaner with a HEPA filter if possible.  Replace carpet with wood, tile, or vinyl flooring. Carpet can trap animal skin flakes and dust.  Use allergy-proof pillows, mattress covers, and box spring covers.  Wash bed sheets and blankets every week in hot water and dry them in a dryer.  Use blankets that are made of polyester or cotton.  Clean bathrooms and kitchens with bleach.  If possible, have someone repaint the walls in these rooms with mold-resistant paint. Keep out of the rooms that are being cleaned and painted.  Wash hands often. GET HELP IF:  You have make a whistling sound when breaking (wheeze), have shortness of breath, or have a cough even if taking medicine to prevent attacks.  The colored mucus you cough up (sputum) is thicker than usual.  The colored mucus you cough up changes from clear or white to yellow, green, gray, or bloody.  You have problems from the medicine you are taking such as:  A rash.  Itching.  Swelling.  Trouble breathing.  You need reliever medicines more than 2-3 times a week.  Your peak flow measurement is still at 50-79% of your personal best after following the action plan for 1 hour.  You have a fever. GET HELP RIGHT AWAY IF:   You seem to be worse and are not responding to medicine during an asthma attack.  You are short of breath even at rest.  You get short of breath when doing very little activity.  You have trouble eating, drinking, or talking.  You have chest pain.  You have a fast heartbeat.  Your lips or fingernails start to turn blue.  You are light-headed, dizzy, or faint.  Your peak flow is less than 50% of your personal best.   This information is not intended to replace advice given to you by your health care provider. Make sure   you discuss any questions you have with your health care provider.   Document Released: 09/20/2007 Document Revised: 12/23/2014 Document Reviewed: 10/31/2012 Elsevier Interactive Patient Education 2016 [employer]  

## 2015-03-07 NOTE — ED Provider Notes (Addendum)
CSN: [id number]     Arrival date & time 03/07/15  1312 History   None    Chief Complaint  Patient presents with  . Asthma   (Consider location/radiation/quality/duration/timing/severity/associated sxs/prior Treatment) HPI History obtained from patient:   LOCATION: Upper resp SEVERITY:no pain DURATION: 2-3 days CONTEXT: ongoing issue since the first of the month QUALITY:similar to previous asthma exacerbations MODIFYING FACTORS:using nebulizer without relief ASSOCIATED SYMPTOMS: Wheezing, cough TIMING: Constant    Past Medical History  Diagnosis Date  . Asthma   . Bronchiectasis with acute exacerbation (HCC)    History reviewed. No pertinent past surgical history. No family history on file. Social History  Substance Use Topics  . Smoking status: Former Games developermoker  . Smokeless tobacco: Never Used  . Alcohol Use: No   OB History    No data available     Review of Systems ROS +'ve wheezing   Denies: HEADACHE, NAUSEA, ABDOMINAL PAIN, CHEST PAIN, CONGESTION, DYSURIA, SHORTNESS OF BREATH Allergies  Review of patient's allergies indicates no known allergies.  Home Medications   Prior to Admission medications   Medication Sig Start Date End Date Taking? Authorizing Provider  albuterol (PROVENTIL) (2.5 MG/3ML) 0.083% nebulizer solution Take 2.5 mg by nebulization every 6 (six) hours as needed for wheezing or shortness of breath.   Yes Historical Provider, MD  albuterol (PROVENTIL HFA;VENTOLIN HFA) 108 (90 BASE) MCG/ACT inhaler Inhale 1-2 puffs into the lungs every 6 (six) hours as needed for wheezing or shortness of breath. 12/14/14   Santiago GladHeather Laisure, PA-C  albuterol (PROVENTIL HFA;VENTOLIN HFA) 108 (90 BASE) MCG/ACT inhaler Inhale 2 puffs into the lungs every 4 (four) hours as needed for wheezing or shortness of breath. 02/16/15   Kristen N Ward, DO  predniSONE (DELTASONE) 10 MG tablet Take 2 tablets (20 mg total) by mouth daily. Take 60 mg x 4 days then 40 mg x 2 days then 20  mg x 2 days then 10 mg x 2 days then stop 02/16/15   Layla MawKristen N Ward, DO   Meds Ordered and Administered this Visit   Medications  albuterol (PROVENTIL) (2.5 MG/3ML) 0.083% nebulizer solution 5 mg (5 mg Nebulization Given 03/07/15 1410)  ipratropium-albuterol (DUONEB) 0.5-2.5 (3) MG/3ML nebulizer solution 3 mL (3 mLs Nebulization Given 03/07/15 1410)  methylPREDNISolone sodium succinate (SOLU-MEDROL) 125 mg/2 mL injection 125 mg (125 mg Intramuscular Given 03/07/15 1410)    BP 125/79 mmHg  Pulse 70  Temp(Src) 97.3 F (36.3 C) (Oral)  Resp 20  SpO2 93% No data found.   Physical Exam NURSES NOTES AND VITAL SIGNS REVIEWED. CONSTITUTIONAL: Well developed, well nourished, no acute distress HEENT: normocephalic, atraumatic EYES: Conjunctiva normal NECK:normal ROM, supple PULMONARY:No respiratory distress, normal effort, Lungs: Diffuse wheezing throughout her lung fields without use of a sensory muscles no acute respiratory distress. CARDIOVASCULAR: RRR, no murmur ABDOMEN: soft, ND, NT, +'ve BS MUSCULOSKELETAL: Normal ROM of all extremities SKIN: warm and dry without rash PSYCHIATRIC: Mood and affect normal  ED Course  Procedures (including critical care time)  Labs Review Labs Reviewed - No data to display  Imaging Review No results found.   Visual Acuity Review  Right Eye Distance:   Left Eye Distance:   Bilateral Distance:    Right  Eye Near:   Left Eye Near:    Bilateral Near:         MDM   1. Asthma exacerbation     Nebulizer treatment including albuterol and ipratropium. Prescription for prednisone taper and amoxicillin. Patient declines work  note.  Patient states that she is fell in about 100% better after the DuoNeb nebulizer. On post treatment examination she does continue to have a small minimal wheeze but is significantly improved.    Tharon Aquas, PA 03/07/15 1448  Tharon Aquas, PA 04/30/15 [phone]

## 2015-03-07 NOTE — ED Notes (Signed)
Pt here for asthma flare up onset 2 days Sx include chest tightness, dyspnea, prod cough Denies fevers, chills A&O x4... No acute distress.

## 2015-04-01 ENCOUNTER — Encounter (HOSPITAL_COMMUNITY): Payer: Self-pay | Admitting: Neurology

## 2015-04-01 ENCOUNTER — Emergency Department (HOSPITAL_COMMUNITY): Payer: Medicaid Other

## 2015-04-01 ENCOUNTER — Emergency Department (HOSPITAL_COMMUNITY)
Admission: EM | Admit: 2015-04-01 | Discharge: 2015-04-01 | Disposition: A | Discharge status: Home / Self Care | Payer: Medicaid Other | Attending: Emergency Medicine | Admitting: Emergency Medicine

## 2015-04-01 DIAGNOSIS — Z792 Long term (current) use of antibiotics: Secondary | ICD-10-CM | POA: Diagnosis not present

## 2015-04-01 DIAGNOSIS — R05 Cough: Secondary | ICD-10-CM | POA: Diagnosis present

## 2015-04-01 DIAGNOSIS — R059 Cough, unspecified: Secondary | ICD-10-CM

## 2015-04-01 DIAGNOSIS — Z87891 Personal history of nicotine dependence: Secondary | ICD-10-CM | POA: Diagnosis not present

## 2015-04-01 DIAGNOSIS — Z79899 Other long term (current) drug therapy: Secondary | ICD-10-CM | POA: Diagnosis not present

## 2015-04-01 DIAGNOSIS — J45901 Unspecified asthma with (acute) exacerbation: Secondary | ICD-10-CM | POA: Diagnosis not present

## 2015-04-01 MED ORDER — ALBUTEROL SULFATE (2.5 MG/3ML) 0.083% IN NEBU
5.0000 mg | INHALATION_SOLUTION | Freq: Once | RESPIRATORY_TRACT | Status: AC
  Administered 2015-04-01: 5 mg via RESPIRATORY_TRACT
  Filled 2015-04-01: qty 6

## 2015-04-01 MED ORDER — IPRATROPIUM BROMIDE 0.02 % IN SOLN
0.5000 mg | Freq: Once | RESPIRATORY_TRACT | Status: AC
  Administered 2015-04-01: 0.5 mg via RESPIRATORY_TRACT
  Filled 2015-04-01: qty 2.5

## 2015-04-01 MED ORDER — AZITHROMYCIN 250 MG PO TABS: 250.0000 mg | ORAL_TABLET | Freq: Every day | ORAL | Status: DC

## 2015-04-01 MED ORDER — HYDROCODONE-HOMATROPINE 5-1.5 MG/5ML PO SYRP: 5.0000 mL | ORAL_SOLUTION | Freq: Four times a day (QID) | ORAL | Status: DC | PRN

## 2015-04-01 MED ORDER — ALBUTEROL SULFATE (2.5 MG/3ML) 0.083% IN NEBU
5.0000 mg | INHALATION_SOLUTION | Freq: Once | RESPIRATORY_TRACT | Status: AC
  Administered 2015-04-01: 5 mg via RESPIRATORY_TRACT

## 2015-04-01 MED ORDER — ALBUTEROL SULFATE (2.5 MG/3ML) 0.083% IN NEBU
INHALATION_SOLUTION | RESPIRATORY_TRACT | Status: AC
  Filled 2015-04-01: qty 6

## 2015-04-01 MED ORDER — DEXAMETHASONE 10 MG/ML FOR PEDIATRIC ORAL USE
10.0000 mg | Freq: Once | INTRAMUSCULAR | Status: AC
  Administered 2015-04-01: 10 mg via ORAL
  Filled 2015-04-01: qty 1

## 2015-04-01 MED ORDER — DEXAMETHASONE SODIUM PHOSPHATE 10 MG/ML IJ SOLN
10.0000 mg | Freq: Once | INTRAMUSCULAR | Status: DC
  Filled 2015-04-01: qty 1

## 2015-04-01 NOTE — ED Provider Notes (Signed)
CSN: [id number]     Arrival date & time 04/01/15  1708 History  By signing my name below, I, Freida Busman, attest that this documentation has been prepared under the direction and in the presence of non-physician practitioner, Roxy Horseman, PA-C. Electronically Signed: Freida Busman, Scribe. 04/01/2015. 6:39 PM.    Chief Complaint  Patient presents with  . Cough  . Chest Pain    The history is provided by the patient. No language interpreter was used.     HPI Comments:  Wendy Morgan is a 39 y.o. female with a history of asthma, who presents to the Emergency Department complaining of persistent cough for 3 days with associated wheezing, SOB, and chest tightness. She also notes CP with cough. Pt has been using home neb treatment with moderate temporary relief. She denies h/o DM and fever.  Past Medical History  Diagnosis Date  . Asthma   . Bronchiectasis with acute exacerbation (HCC)    History reviewed. No pertinent past surgical history. No family history on file. Social History  Substance Use Topics  . Smoking status: Former Games developer  . Smokeless tobacco: Never Used  . Alcohol Use: No   OB History    No data available     Review of Systems  Constitutional: Negative for fever and chills.  Respiratory: Positive for cough, chest tightness, shortness of breath and wheezing.   Cardiovascular: Positive for chest pain.  All other systems reviewed and are negative.   Allergies  Review of patient's allergies indicates no known allergies.  Home Medications   Prior to Admission medications   Medication Sig Start Date End Date Taking? Authorizing Provider  albuterol (PROVENTIL HFA;VENTOLIN HFA) 108 (90 BASE) MCG/ACT inhaler Inhale 1-2 puffs into the lungs every 6 (six) hours as needed for wheezing or shortness of breath. 12/14/14   Santiago Glad, PA-C  albuterol (PROVENTIL HFA;VENTOLIN HFA) 108 (90 BASE) MCG/ACT inhaler Inhale 2 puffs into the lungs every 4 (four) hours  as needed for wheezing or shortness of breath. 02/16/15   Kristen N Ward, DO  albuterol (PROVENTIL) (2.5 MG/3ML) 0.083% nebulizer solution Take 2.5 mg by nebulization every 6 (six) hours as needed for wheezing or shortness of breath.    Historical Provider, MD  amoxicillin (AMOXIL) 500 MG capsule Take 1 capsule (500 mg total) by mouth 3 (three) times daily. 03/07/15   Tharon Aquas, PA  azithromycin (ZITHROMAX Z-PAK) 250 MG tablet Take 1 tablet (250 mg total) by mouth daily.  PO day 1, then  PO days 205 04/01/15   Roxy Horseman, PA-C  HYDROcodone-homatropine [facility]) 5-1.5 MG/5ML syrup Take 5 mLs by mouth every 6 (six) hours as needed for cough. 04/01/15   Roxy Horseman, PA-C  predniSONE (DELTASONE) 10 MG tablet Sig: 4 tables once a day for 3 days, 3 tablets once a day X3 days, 2 tablets a day for 3 days, 1 tablet a day for 3 days. 03/07/15   Tharon Aquas, PA   BP 129/85 mmHg  Pulse 76  Temp(Src) 98.8 F (37.1 C) (Oral)  Resp 16  SpO2 94%  LMP 02/17/2015 Physical Exam   Physical Exam  Constitutional: Pt  is oriented to person, place, and time. Appears well-developed and well-nourished. No distress.  HENT:  Head: Normocephalic and atraumatic.  Right Ear: Tympanic membrane, external ear and ear canal normal.  Left Ear: Tympanic membrane, external ear and ear canal normal.  Nose: Mucosal edema and no rhinorrhea present. No epistaxis. Right sinus exhibits no maxillary  sinus tenderness and no frontal sinus tenderness. Left sinus exhibits no maxillary sinus tenderness and no frontal sinus tenderness.  Mouth/Throat: Uvula is midline and mucous membranes are normal. Mucous membranes are not pale and not cyanotic. No oropharyngeal exudate, posterior oropharyngeal edema, posterior oropharyngeal erythema or tonsillar abscesses.  Eyes: Conjunctivae are normal. Pupils are equal, round, and reactive to light.  Neck: Normal range of motion and full passive range of motion without pain.   Cardiovascular: Normal rate and intact distal pulses.   Pulmonary/Chest: Effort normal and breath sounds normal. No stridor.  Mild left sided wheezes; no rales or rhonchi Abdominal: Soft. Bowel sounds are normal. There is no tenderness.  Musculoskeletal: Normal range of motion.  Lymphadenopathy:    Pthas no cervical adenopathy.  Neurological: Pt is alert and oriented to person, place, and time.  Skin: Skin is warm and dry. No rash noted. Pt is not diaphoretic.  Psychiatric: Normal mood and affect.  Nursing note and vitals reviewed.    ED Course  Procedures   DIAGNOSTIC STUDIES:  Oxygen Saturation is 94% on RA, normal by my interpretation.    COORDINATION OF CARE:  5:53 PM Discussed treatment plan with pt at bedside and pt agreed to plan.  Imaging Review Dg Chest 2 View  04/01/2015  CLINICAL DATA:  Chest pain and cough EXAM: CHEST  2 VIEW COMPARISON:  02/16/2015 chest radiograph. FINDINGS: Stable cardiomediastinal silhouette with normal heart size. No pneumothorax. No pleural effusion. Clear lungs, with no focal lung consolidation and no pulmonary edema. IMPRESSION: No active cardiopulmonary disease. Electronically Signed   By: Delbert PhenixJason A Poff M.D.   On: 04/01/2015 18:33   I have personally reviewed and evaluated these images as part of my medical decision-making.   EKG Interpretation None      MDM   Final diagnoses:  Cough  Asthma exacerbation    Patient with cough and wheezing. Hx of asthma.  Improved with one neb.  CXR negative.  Will give additional nebs.  Repeat assessment after second nebs is better.  VSS.  Given decadron in ED.  PCP follow-up.  Patient has nebs at home.  I personally performed the services described in this documentation, which was scribed in my presence. The recorded information has been reviewed and is accurate.      Roxy Horsemanobert Chattie Greeson, PA-C 04/01/15 1845  Benjiman CoreNathan Pickering, MD 04/01/15 [phone]

## 2015-04-01 NOTE — ED Notes (Signed)
Pt reports cough for 3 days, soreness to chest when coughing. Pt has expiratory wheezing. Yellow sputum from productive cough. Has asthma.

## 2015-04-01 NOTE — Discharge Instructions (Signed)

## 2015-04-06 ENCOUNTER — Emergency Department (HOSPITAL_COMMUNITY)
Admission: EM | Admit: 2015-04-06 | Discharge: 2015-04-07 | Disposition: A | Discharge status: Home / Self Care | Payer: Medicaid Other | Attending: Emergency Medicine | Admitting: Emergency Medicine

## 2015-04-06 ENCOUNTER — Encounter (HOSPITAL_COMMUNITY): Payer: Self-pay | Admitting: Emergency Medicine

## 2015-04-06 ENCOUNTER — Emergency Department (HOSPITAL_COMMUNITY): Payer: Medicaid Other

## 2015-04-06 DIAGNOSIS — Z79899 Other long term (current) drug therapy: Secondary | ICD-10-CM | POA: Insufficient documentation

## 2015-04-06 DIAGNOSIS — Z792 Long term (current) use of antibiotics: Secondary | ICD-10-CM | POA: Insufficient documentation

## 2015-04-06 DIAGNOSIS — J45901 Unspecified asthma with (acute) exacerbation: Secondary | ICD-10-CM | POA: Diagnosis not present

## 2015-04-06 DIAGNOSIS — R0602 Shortness of breath: Secondary | ICD-10-CM | POA: Diagnosis present

## 2015-04-06 DIAGNOSIS — Z87891 Personal history of nicotine dependence: Secondary | ICD-10-CM | POA: Insufficient documentation

## 2015-04-06 MED ORDER — IPRATROPIUM BROMIDE 0.02 % IN SOLN
0.5000 mg | Freq: Once | RESPIRATORY_TRACT | Status: AC
  Administered 2015-04-06: 0.5 mg via RESPIRATORY_TRACT
  Filled 2015-04-06: qty 2.5

## 2015-04-06 MED ORDER — ALBUTEROL SULFATE (2.5 MG/3ML) 0.083% IN NEBU
5.0000 mg | INHALATION_SOLUTION | Freq: Once | RESPIRATORY_TRACT | Status: AC
  Administered 2015-04-06: 5 mg via RESPIRATORY_TRACT

## 2015-04-06 MED ORDER — ALBUTEROL (5 MG/ML) CONTINUOUS INHALATION SOLN
10.0000 mg/h | INHALATION_SOLUTION | Freq: Once | RESPIRATORY_TRACT | Status: AC
  Administered 2015-04-06: 10 mg/h via RESPIRATORY_TRACT
  Filled 2015-04-06: qty 20

## 2015-04-06 MED ORDER — ALBUTEROL SULFATE (2.5 MG/3ML) 0.083% IN NEBU
INHALATION_SOLUTION | RESPIRATORY_TRACT | Status: AC
  Filled 2015-04-06: qty 3

## 2015-04-06 MED ORDER — ALBUTEROL SULFATE (2.5 MG/3ML) 0.083% IN NEBU
INHALATION_SOLUTION | RESPIRATORY_TRACT | Status: DC
Start: 2015-04-06 — End: 2015-04-07
  Filled 2015-04-06: qty 3

## 2015-04-06 MED ORDER — PREDNISONE 20 MG PO TABS
60.0000 mg | ORAL_TABLET | Freq: Once | ORAL | Status: AC
  Administered 2015-04-06: 60 mg via ORAL
  Filled 2015-04-06: qty 3

## 2015-04-06 MED ORDER — ALBUTEROL SULFATE (2.5 MG/3ML) 0.083% IN NEBU
5.0000 mg | INHALATION_SOLUTION | Freq: Once | RESPIRATORY_TRACT | Status: AC
  Administered 2015-04-06: 5 mg via RESPIRATORY_TRACT
  Filled 2015-04-06: qty 6

## 2015-04-06 NOTE — ED Provider Notes (Signed)
CSN: [id number]     Arrival date & time 04/06/15  2040 History   First MD Initiated Contact with Patient 04/06/15 2127     Chief Complaint  Patient presents with  . Asthma    The patient was seen here for an asthma exacerabtion and was given breathing treatment steroids.  She is back today for the same thing.       (Consider location/radiation/quality/duration/timing/severity/associated sxs/prior Treatment) Patient is a 39 y.o. female presenting with shortness of breath. The history is provided by the patient.  Shortness of Breath Severity:  Moderate Onset quality:  Gradual Timing:  Intermittent Progression:  Waxing and waning Chronicity:  Recurrent Context comment:  History of asthma Relieved by:  Inhaler Worsened by:  Exertion and weather changes Ineffective treatments:  Rest and inhaler Associated symptoms: cough and wheezing   Associated symptoms: no abdominal pain, no chest pain, no claudication, no diaphoresis, no ear pain, no fever, no headaches, no neck pain, no rash, no sore throat, no sputum production, no syncope, no swollen glands and no vomiting   Risk factors: no hx of PE/DVT, no obesity and no prolonged immobilization     Past Medical History  Diagnosis Date  . Asthma   . Bronchiectasis with acute exacerbation (HCC)    History reviewed. No pertinent past surgical history. History reviewed. No pertinent family history. Social History  Substance Use Topics  . Smoking status: Former Games developer  . Smokeless tobacco: Never Used  . Alcohol Use: No   OB History    No data available     Review of Systems  Constitutional: Negative for fever and diaphoresis.  HENT: Negative for ear pain and sore throat.   Eyes: Negative for pain.  Respiratory: Positive for cough, chest tightness, shortness of breath and wheezing. Negative for sputum production.   Cardiovascular: Negative for chest pain, claudication and syncope.  Gastrointestinal: Negative for nausea, vomiting and  abdominal pain.  Genitourinary: Negative for dysuria.  Musculoskeletal: Negative for neck pain.  Skin: Negative for rash.  Neurological: Negative for headaches.      Allergies  Review of patient's allergies indicates no known allergies.  Home Medications   Prior to Admission medications   Medication Sig Start Date End Date Taking? Authorizing Provider  albuterol (PROVENTIL HFA;VENTOLIN HFA) 108 (90 BASE) MCG/ACT inhaler Inhale 1-2 puffs into the lungs every 6 (six) hours as needed for wheezing or shortness of breath. 12/14/14  Yes Heather Laisure, PA-C  albuterol (PROVENTIL) (2.5 MG/3ML) 0.083% nebulizer solution Take 2.5 mg by nebulization every 6 (six) hours as needed for wheezing or shortness of breath.   Yes Historical Provider, MD  albuterol (PROVENTIL HFA;VENTOLIN HFA) 108 (90 BASE) MCG/ACT inhaler Inhale 2 puffs into the lungs every 4 (four) hours as needed for wheezing or shortness of breath. 02/16/15   Kristen N Ward, DO  amoxicillin (AMOXIL) 500 MG capsule Take 1 capsule (500 mg total) by mouth 3 (three) times daily. 03/07/15   Tharon Aquas, PA  azithromycin (ZITHROMAX Z-PAK) 250 MG tablet Take 1 tablet (250 mg total) by mouth daily.  PO day 1, then  PO days 205 Patient not taking: Reported on 04/06/2015 04/01/15   Roxy Horseman, PA-C  HYDROcodone-homatropine [facility]) 5-1.5 MG/5ML syrup Take 5 mLs by mouth every 6 (six) hours as needed for cough. 04/01/15   Roxy Horseman, PA-C  predniSONE (DELTASONE) 10 MG tablet Take 5 tablets (50 mg total) by mouth daily. 04/07/15 04/11/15  Stacy Gardner, MD   BP 118/68 mmHg  Pulse 78  Temp(Src) 97.6 F (36.4 C) (Oral)  Resp 18  SpO2 94%  LMP 02/17/2015 Physical Exam  Constitutional: She is oriented to person, place, and time. She appears well-developed and well-nourished. No distress.  HENT:  Head: Normocephalic.  Eyes: Conjunctivae and EOM are normal. Pupils are equal, round, and reactive to light.  Neck: Normal  range of motion. Neck supple.  Cardiovascular: Normal rate, regular rhythm and normal heart sounds.  Exam reveals no gallop and no friction rub.   No murmur heard. Pulmonary/Chest: Effort normal. No accessory muscle usage. No tachypnea and no bradypnea. No respiratory distress. She has wheezes in the right upper field, the right middle field, the right lower field, the left upper field, the left middle field and the left lower field.  Abdominal: Soft. Bowel sounds are normal. She exhibits no distension and no mass. There is no tenderness. There is no rebound and no guarding.  Musculoskeletal: Normal range of motion.  Neurological: She is alert and oriented to person, place, and time.  Skin: Skin is warm and dry. She is not diaphoretic.  Psychiatric: She has a normal mood and affect. Her speech is normal.    ED Course  Procedures (including critical care time) Labs Review Labs Reviewed - No data to display  Imaging Review Dg Chest [facility] 1 View  04/06/2015  CLINICAL DATA:  Shortness of Breath tonight. EXAM: PORTABLE CHEST 1 VIEW COMPARISON:  04/01/2015. FINDINGS: The heart size and mediastinal contours are within normal limits. Both lungs are clear. The visualized skeletal structures are unremarkable. IMPRESSION: No acute cardiopulmonary findings. Electronically Signed   By: Rudie Meyer M.D.   On: 04/06/2015 23:13   I have personally reviewed and evaluated these images and lab results as part of my medical decision-making.   EKG Interpretation None      MDM   Final diagnoses:  Asthma exacerbation    39 year old African-American female with past medical history asthma presents for setting shortness of breath. Patient reports she was seen several days ago with similar symptoms and was discharged home with azithromycin, steroids, inhaler. She reports symptoms improved for several days but the last couple of days the symptoms have worsened. She reports not using inhaler as often over the  last several days. She denies any headaches or vision changes, chest pain, sputum production, abdominal pain, nausea, vomiting, fevers, rashes, recent sick contacts or recent travel. She denies any swelling in the lower extremities.  On arrival patient was afebrile and other vital signs within normal limits. Patient was found on auscultation to have significant end expiratory wheezing bilaterally. Patient given inhaler treatment with mild improvement in symptoms. Due to continued wheezing on reassessment chest x-ray was ordered as well as continuous nebulized treatment. Patient does endorase mild cough during this time but no significant sputum production.  On reassessment the patient continued to improve but had mild wheezing. Chest x-ray which I personally reviewed revealed no acute cardiopulmonary abnormality. No signs of pneumonia. Patient will be given Atrovent treatment at this time as well as 60 mg prednisone.  On reassessment wheezing significantly improved and patient reported improvement in symptoms. This is likely asthma exacerbation in setting of recent viral illness. Will discharge patient home with plans continue albuterol treatment as well as short course of oral steroids. Patient advised to follow up PCP in one day for reevaluation. Patient given strict return precautions and stable at time of discharge.  Attending has seen and evaluated patient and Dr. Denton Lank is in  agreement with plan.    Stacy GardnerAndrew Miken Stecher, MD 04/07/15 [id number]  Stacy GardnerAndrew Devaeh Amadi, MD 04/07/15 [id number]  Cathren LaineKevin Steinl, MD 04/12/15 [phone]

## 2015-04-06 NOTE — ED Notes (Addendum)
MD to see and assess patient before RN assessment. See MD assessment. 

## 2015-04-06 NOTE — ED Notes (Signed)
The patient was seen here for an asthma exacerabtion and was given breathing treatment steroids.  She is back today for the same thing.   The patient denies any pain but says she is nauseated when she coughs.

## 2015-04-07 MED ORDER — PREDNISONE 10 MG PO TABS: 50.0000 mg | ORAL_TABLET | Freq: Every day | ORAL | Status: AC

## 2015-04-07 MED ORDER — ALBUTEROL SULFATE HFA 108 (90 BASE) MCG/ACT IN AERS
2.0000 | INHALATION_SPRAY | Freq: Once | RESPIRATORY_TRACT | Status: AC
  Administered 2015-04-07: 2 via RESPIRATORY_TRACT
  Filled 2015-04-07: qty 6.7

## 2015-04-07 NOTE — Discharge Instructions (Signed)
Asthma Attack Prevention °While you may not be able to control the fact that you have asthma, you can take actions to prevent asthma attacks. The best way to prevent asthma attacks is to maintain good control of your asthma. You can achieve this by: °1. Taking your medicines as directed. °2. Avoiding things that can irritate your airways or make your asthma symptoms worse (asthma triggers). °3. Keeping track of how well your asthma is controlled and of any changes in your symptoms. °4. Responding quickly to worsening asthma symptoms (asthma attack). °5. Seeking emergency care when it is needed. °WHAT ARE SOME WAYS TO PREVENT AN ASTHMA ATTACK? °Have a Plan °Work with your health care provider to create a written plan for managing and treating your asthma attacks (asthma action plan). This plan includes: °1. A list of your asthma triggers and how you can avoid them. °2. Information on when medicines should be taken and when their dosages should be changed. °3. The use of a device that measures how well your lungs are working (peak flow meter). °Monitor Your Asthma °Use your peak flow meter and record your results in a journal every day. A drop in your peak flow numbers on one or more days may indicate the start of an asthma attack. This can happen even before you start to feel symptoms. You can prevent an asthma attack from getting worse by following the steps in your asthma action plan. °Avoid Asthma Triggers °Work with your asthma health care provider to find out what your asthma triggers are. This can be done by: °· Allergy testing. °· Keeping a journal that notes when asthma attacks occur and the factors that may have contributed to them. °· Determining if there are other medical conditions that are making your asthma worse. °Once you have determined your asthma triggers, take steps to avoid them. This may include avoiding excessive or prolonged exposure to: °· Dust. Have someone dust and vacuum your home for you  once or twice a week. Using a high-efficiency particulate arrestance (HEPA) vacuum is best. °· Smoke. This includes campfire smoke, forest fire smoke, and secondhand smoke from tobacco products. °· Pet dander. Avoid contact with animals that you know you are allergic to. °· Allergens from trees, grasses or pollens. Avoid spending a lot of time outdoors when pollen counts are high, and on very windy days. °· Very cold, dry, or humid air. °· Mold. °· Foods that contain high amounts of sulfites. °· Strong odors. °· Outdoor air pollutants, such as engine exhaust. °· Indoor air pollutants, such as aerosol sprays and fumes from household cleaners. °· Household pests, including dust mites and cockroaches, and pest droppings. °· Certain medicines, including NSAIDs. Always talk to your health care provider before stopping or starting any new medicines. °Medicines °Take over-the-counter and prescription medicines only as told by your health care provider. Many asthma attacks can be prevented by carefully following your medicine schedule. Taking your medicines correctly is especially important when you cannot avoid certain asthma triggers. °Act Quickly °If an asthma attack does happen, acting quickly can decrease how severe it is and how long it lasts. Take these steps:  °· Pay attention to your symptoms. If you are coughing, wheezing, or having difficulty breathing, do not wait to see if your symptoms go away on their own. Follow your asthma action plan. °· If you have followed your asthma action plan and your symptoms are not improving, call your health care provider or seek immediate medical care   at the nearest hospital. °It is important to note how often you need to use your fast-acting rescue inhaler. If you are using your rescue inhaler more often, it may mean that your asthma is not under control. Adjusting your asthma treatment plan may help you to prevent future asthma attacks and help you to gain better control of  your condition. °HOW CAN I PREVENT AN ASTHMA ATTACK WHEN I EXERCISE? °Follow advice from your health care provider about whether you should use your fast-acting inhaler before exercising. Many people with asthma experience exercise-induced bronchoconstriction (EIB). This condition often worsens during vigorous exercise in cold, humid, or dry environments. Usually, people with EIB can stay very active by pre-treating with a fast-acting inhaler before exercising. °  °This information is not intended to replace advice given to you by your health care provider. Make sure you discuss any questions you have with your health care provider. °  °Document Released: 03/22/2009 Document Revised: 12/23/2014 Document Reviewed: 09/03/2014 °[employer]vier Interactive Patient Education ©2016 [employer] ° °Asthma, Adult °Asthma is a recurring condition in which the airways tighten and narrow. Asthma can make it difficult to breathe. It can cause coughing, wheezing, and shortness of breath. Asthma episodes, also called asthma attacks, range from minor to life-threatening. Asthma cannot be cured, but medicines and lifestyle changes can help control it. °CAUSES °Asthma is believed to be caused by inherited (genetic) and environmental factors, but its exact cause is unknown. Asthma may be triggered by allergens, lung infections, or irritants in the air. Asthma triggers are different for each person. Common triggers include:  °6. Animal dander. °7. Dust mites. °8. Cockroaches. °9. Pollen from trees or grass. °10. Mold. °11. Smoke. °12. Air pollutants such as dust, household cleaners, hair sprays, aerosol sprays, paint fumes, strong chemicals, or strong odors. °13. Cold air, weather changes, and winds (which increase molds and pollens in the air). °14. Strong emotional expressions such as crying or laughing hard. °15. Stress. °16. Certain medicines (such as aspirin) or types of drugs (such as beta-blockers). °17. Sulfites in foods and drinks.  Foods and drinks that may contain sulfites include dried fruit, potato chips, and sparkling grape juice. °18. Infections or inflammatory conditions such as the flu, a cold, or an inflammation of the nasal membranes (rhinitis). °19. Gastroesophageal reflux disease (GERD). °20. Exercise or strenuous activity. °SYMPTOMS °Symptoms may occur immediately after asthma is triggered or many hours later. Symptoms include: °4. Wheezing. °5. Excessive nighttime or early morning coughing. °6. Frequent or severe coughing with a common cold. °7. Chest tightness. °8. Shortness of breath. °DIAGNOSIS  °The diagnosis of asthma is made by a review of your medical history and a physical exam. Tests may also be performed. These may include: °· Lung function studies. These tests show how much air you breathe in and out. °· Allergy tests. °· Imaging tests such as X-rays. °TREATMENT  °Asthma cannot be cured, but it can usually be controlled. Treatment involves identifying and avoiding your asthma triggers. It also involves medicines. There are 2 classes of medicine used for asthma treatment:  °· Controller medicines. These prevent asthma symptoms from occurring. They are usually taken every day. °· Reliever or rescue medicines. These quickly relieve asthma symptoms. They are used as needed and provide short-term relief. °Your health care provider will help you create an asthma action plan. An asthma action plan is a written plan for managing and treating your asthma attacks. It includes a list of your asthma triggers and how they   may be avoided. It also includes information on when medicines should be taken and when their dosage should be changed. An action plan may also involve the use of a device called a peak flow meter. A peak flow meter measures how well the lungs are working. It helps you monitor your condition. °HOME CARE INSTRUCTIONS  °· Take medicines only as directed by your health care provider. Speak with your health care  provider if you have questions about how or when to take the medicines. °· Use a peak flow meter as directed by your health care provider. Record and keep track of readings. °· Understand and use the action plan to help minimize or stop an asthma attack without needing to seek medical care. °· Control your home environment in the following ways to help prevent asthma attacks: °· Do not smoke. Avoid being exposed to secondhand smoke. °· Change your heating and air conditioning filter regularly. °· Limit your use of fireplaces and wood stoves. °· Get rid of pests (such as roaches and mice) and their droppings. °· Throw away plants if you see mold on them. °· Clean your floors and dust regularly. Use unscented cleaning products. °· Try to have someone else vacuum for you regularly. Stay out of rooms while they are being vacuumed and for a short while afterward. If you vacuum, use a dust mask from a hardware store, a double-layered or microfilter vacuum cleaner bag, or a vacuum cleaner with a HEPA filter. °· Replace carpet with wood, tile, or vinyl flooring. Carpet can trap dander and dust. °· Use allergy-proof pillows, mattress covers, and box spring covers. °· Wash bed sheets and blankets every week in hot water and dry them in a dryer. °· Use blankets that are made of polyester or cotton. °· Clean bathrooms and kitchens with bleach. If possible, have someone repaint the walls in these rooms with mold-resistant paint. Keep out of the rooms that are being cleaned and painted. °· Wash hands frequently. °SEEK MEDICAL CARE IF:  °· You have wheezing, shortness of breath, or a cough even if taking medicine to prevent attacks. °· The colored mucus you cough up (sputum) is thicker than usual. °· Your sputum changes from clear or white to yellow, green, gray, or bloody. °· You have any problems that may be related to the medicines you are taking (such as a rash, itching, swelling, or trouble breathing). °· You are using a  reliever medicine more than 2-3 times per week. °· Your peak flow is still at 50-79% of your personal best after following your action plan for 1 hour. °· You have a fever. °SEEK IMMEDIATE MEDICAL CARE IF:  °· You seem to be getting worse and are unresponsive to treatment during an asthma attack. °· You are short of breath even at rest. °· You get short of breath when doing very little physical activity. °· You have difficulty eating, drinking, or talking due to asthma symptoms. °· You develop chest pain. °· You develop a fast heartbeat. °· You have a bluish color to your lips or fingernails. °· You are light-headed, dizzy, or faint. °· Your peak flow is less than 50% of your personal best. °  °This information is not intended to replace advice given to you by your health care provider. Make sure you discuss any questions you have with your health care provider. °  °Document Released: 04/03/2005 Document Revised: 12/23/2014 Document Reviewed: 10/31/2012 °[employer]vier Interactive Patient Education ©2016 [employer] ° °How to   Use an Inhaler °Proper inhaler technique is very important. Good technique ensures that the medicine reaches the lungs. Poor technique results in depositing the medicine on the tongue and back of the throat rather than in the airways. If you do not use the inhaler with good technique, the medicine will not help you. °STEPS TO FOLLOW IF USING AN INHALER WITHOUT AN EXTENSION TUBE °21. Remove the cap from the inhaler. °22. If you are using the inhaler for the first time, you will need to prime it. Shake the inhaler for 5 seconds and release four puffs into the air, away from your face. Ask your health care provider or pharmacist if you have questions about priming your inhaler. °23. Shake the inhaler for 5 seconds before each breath in (inhalation). °24. Position the inhaler so that the top of the canister faces up. °25. Put your index finger on the top of the medicine canister. Your thumb supports  the bottom of the inhaler. °26. Open your mouth. °27. Either place the inhaler between your teeth and place your lips tightly around the mouthpiece, or hold the inhaler 1-2 inches away from your open mouth. If you are unsure of which technique to use, ask your health care provider. °28. Breathe out (exhale) normally and as completely as possible. °29. Press the canister down with your index finger to release the medicine. °30. At the same time as the canister is pressed, inhale deeply and slowly until your lungs are completely filled. This should take 4-6 seconds. Keep your tongue down. °31. Hold the medicine in your lungs for 5-10 seconds (10 seconds is best). This helps the medicine get into the small airways of your lungs. °32. Breathe out slowly, through pursed lips. Whistling is an example of pursed lips. °33. Wait at least 15-30 seconds between puffs. Continue with the above steps until you have taken the number of puffs your health care provider has ordered. Do not use the inhaler more than your health care provider tells you. °34. Replace the cap on the inhaler. °35. Follow the directions from your health care provider or the inhaler insert for cleaning the inhaler. °STEPS TO FOLLOW IF USING AN INHALER WITH AN EXTENSION (SPACER) °9. Remove the cap from the inhaler. °10. If you are using the inhaler for the first time, you will need to prime it. Shake the inhaler for 5 seconds and release four puffs into the air, away from your face. Ask your health care provider or pharmacist if you have questions about priming your inhaler. °11. Shake the inhaler for 5 seconds before each breath in (inhalation). °12. Place the open end of the spacer onto the mouthpiece of the inhaler. °13. Position the inhaler so that the top of the canister faces up and the spacer mouthpiece faces you. °14. Put your index finger on the top of the medicine canister. Your thumb supports the bottom of the inhaler and the spacer. °15. Breathe  out (exhale) normally and as completely as possible. °16. Immediately after exhaling, place the spacer between your teeth and into your mouth. Close your lips tightly around the spacer. °17. Press the canister down with your index finger to release the medicine. °18. At the same time as the canister is pressed, inhale deeply and slowly until your lungs are completely filled. This should take 4-6 seconds. Keep your tongue down and out of the way. °19. Hold the medicine in your lungs for 5-10 seconds (10 seconds is best). This helps the medicine   get into the small airways of your lungs. Exhale. °20. Repeat inhaling deeply through the spacer mouthpiece. Again hold that breath for up to 10 seconds (10 seconds is best). Exhale slowly. If it is difficult to take this second deep breath through the spacer, breathe normally several times through the spacer. Remove the spacer from your mouth. °21. Wait at least 15-30 seconds between puffs. Continue with the above steps until you have taken the number of puffs your health care provider has ordered. Do not use the inhaler more than your health care provider tells you. °22. Remove the spacer from the inhaler, and place the cap on the inhaler. °23. Follow the directions from your health care provider or the inhaler insert for cleaning the inhaler and spacer. °If you are using different kinds of inhalers, use your quick relief medicine to open the airways 10-15 minutes before using a steroid if instructed to do so by your health care provider. If you are unsure which inhalers to use and the order of using them, ask your health care provider, nurse, or respiratory therapist. °If you are using a steroid inhaler, always rinse your mouth with water after your last puff, then gargle and spit out the water. Do not swallow the water. °AVOID: °· Inhaling before or after starting the spray of medicine. It takes practice to coordinate your breathing with triggering the spray. °· Inhaling  through the nose (rather than the mouth) when triggering the spray. °HOW TO DETERMINE IF YOUR INHALER IS FULL OR NEARLY EMPTY °You cannot know when an inhaler is empty by shaking it. A few inhalers are now being made with dose counters. Ask your health care provider for a prescription that has a dose counter if you feel you need that extra help. If your inhaler does not have a counter, ask your health care provider to help you determine the date you need to refill your inhaler. Write the refill date on a calendar or your inhaler canister. Refill your inhaler 7-10 days before it runs out. Be sure to keep an adequate supply of medicine. This includes making sure it is not expired, and that you have a spare inhaler.  °SEEK MEDICAL CARE IF:  °· Your symptoms are only partially relieved with your inhaler. °· You are having trouble using your inhaler. °· You have some increase in phlegm. °SEEK IMMEDIATE MEDICAL CARE IF:  °· You feel little or no relief with your inhalers. You are still wheezing and are feeling shortness of breath or tightness in your chest or both. °· You have dizziness, headaches, or a fast heart rate. °· You have chills, fever, or night sweats. °· You have a noticeable increase in phlegm production, or there is blood in the phlegm. °MAKE SURE YOU:  °· Understand these instructions. °· Will watch your condition. °· Will get help right away if you are not doing well or get worse. °  °This information is not intended to replace advice given to you by your health care provider. Make sure you discuss any questions you have with your health care provider. °  °Document Released: 03/31/2000 Document Revised: 01/22/2013 Document Reviewed: 10/31/2012 °[employer] Interactive Patient Education ©2016 [employer] ° °

## 2015-04-07 NOTE — ED Notes (Signed)
MD at bedside. 

## 2015-04-23 ENCOUNTER — Encounter (HOSPITAL_COMMUNITY): Payer: Self-pay | Admitting: Emergency Medicine

## 2015-04-23 ENCOUNTER — Emergency Department (INDEPENDENT_AMBULATORY_CARE_PROVIDER_SITE_OTHER)
Admission: EM | Admit: 2015-04-23 | Discharge: 2015-04-23 | Disposition: A | Discharge status: Home / Self Care | Payer: Medicaid Other | Source: Home / Self Care | Attending: Family Medicine | Admitting: Family Medicine

## 2015-04-23 DIAGNOSIS — J4541 Moderate persistent asthma with (acute) exacerbation: Secondary | ICD-10-CM

## 2015-04-23 DIAGNOSIS — R0982 Postnasal drip: Secondary | ICD-10-CM

## 2015-04-23 MED ORDER — IPRATROPIUM-ALBUTEROL 0.5-2.5 (3) MG/3ML IN SOLN
RESPIRATORY_TRACT | Status: AC
  Filled 2015-04-23: qty 3

## 2015-04-23 MED ORDER — DEXAMETHASONE SODIUM PHOSPHATE 10 MG/ML IJ SOLN
10.0000 mg | Freq: Once | INTRAMUSCULAR | Status: AC
  Administered 2015-04-23: 10 mg via INTRAMUSCULAR

## 2015-04-23 MED ORDER — DEXAMETHASONE SODIUM PHOSPHATE 10 MG/ML IJ SOLN
INTRAMUSCULAR | Status: AC
  Filled 2015-04-23: qty 1

## 2015-04-23 MED ORDER — ALBUTEROL SULFATE (2.5 MG/3ML) 0.083% IN NEBU
2.5000 mg | INHALATION_SOLUTION | Freq: Once | RESPIRATORY_TRACT | Status: AC
  Administered 2015-04-23: 2.5 mg via RESPIRATORY_TRACT

## 2015-04-23 MED ORDER — BUDESONIDE-FORMOTEROL FUMARATE 80-4.5 MCG/ACT IN AERO: 2.0000 | INHALATION_SPRAY | Freq: Two times a day (BID) | RESPIRATORY_TRACT | Status: DC

## 2015-04-23 MED ORDER — PREDNISONE 20 MG PO TABS: ORAL_TABLET | ORAL | Status: DC

## 2015-04-23 MED ORDER — ALBUTEROL SULFATE (2.5 MG/3ML) 0.083% IN NEBU
INHALATION_SOLUTION | RESPIRATORY_TRACT | Status: AC
Start: 2015-04-23 — End: 2015-04-23
  Filled 2015-04-23: qty 3

## 2015-04-23 MED ORDER — IPRATROPIUM-ALBUTEROL 0.5-2.5 (3) MG/3ML IN SOLN
3.0000 mL | Freq: Once | RESPIRATORY_TRACT | Status: AC
  Administered 2015-04-23: 3 mL via RESPIRATORY_TRACT

## 2015-04-23 NOTE — Discharge Instructions (Signed)
Asthma Attack Prevention Use Allegra or Claritin or Zyrtec for drainage and help with asthma. Start the Symbicort. Use 2 times a day and NO MORE. Never use it to treat a flare up. Just the Albuterol. Start the Prednisone tomorrow as directed. Take with food. While you may not be able to control the fact that you have asthma, you can take actions to prevent asthma attacks. The best way to prevent asthma attacks is to maintain good control of your asthma. You can achieve this by: 1. Taking your medicines as directed. 2. Avoiding things that can irritate your airways or make your asthma symptoms worse (asthma triggers). 3. Keeping track of how well your asthma is controlled and of any changes in your symptoms. 4. Responding quickly to worsening asthma symptoms (asthma attack). 5. Seeking emergency care when it is needed. WHAT ARE SOME WAYS TO PREVENT AN ASTHMA ATTACK? Have a Plan Work with your health care provider to create a written plan for managing and treating your asthma attacks (asthma action plan). This plan includes:  A list of your asthma triggers and how you can avoid them.  Information on when medicines should be taken and when their dosages should be changed.  The use of a device that measures how well your lungs are working (peak flow meter). Monitor Your Asthma Use your peak flow meter and record your results in a journal every day. A drop in your peak flow numbers on one or more days may indicate the start of an asthma attack. This can happen even before you start to feel symptoms. You can prevent an asthma attack from getting worse by following the steps in your asthma action plan. Avoid Asthma Triggers Work with your asthma health care provider to find out what your asthma triggers are. This can be done by:  Allergy testing.  Keeping a journal that notes when asthma attacks occur and the factors that may have contributed to them.  Determining if there are other medical  conditions that are making your asthma worse. Once you have determined your asthma triggers, take steps to avoid them. This may include avoiding excessive or prolonged exposure to:  Dust. Have someone dust and vacuum your home for you once or twice a week. Using a high-efficiency particulate arrestance (HEPA) vacuum is best.  Smoke. This includes campfire smoke, forest fire smoke, and secondhand smoke from tobacco products.  Pet dander. Avoid contact with animals that you know you are allergic to.  Allergens from trees, grasses or pollens. Avoid spending a lot of time outdoors when pollen counts are high, and on very windy days.  Very cold, dry, or humid air.  Mold.  Foods that contain high amounts of sulfites.  Strong odors.  Outdoor air pollutants, such as Museum/gallery exhibitions officerengine exhaust.  Indoor air pollutants, such as aerosol sprays and fumes from household cleaners.  Household pests, including dust mites and cockroaches, and pest droppings.  Certain medicines, including NSAIDs. Always talk to your health care provider before stopping or starting any new medicines. Medicines Take over-the-counter and prescription medicines only as told by your health care provider. Many asthma attacks can be prevented by carefully following your medicine schedule. Taking your medicines correctly is especially important when you cannot avoid certain asthma triggers. Act Quickly If an asthma attack does happen, acting quickly can decrease how severe it is and how long it lasts. Take these steps:   Pay attention to your symptoms. If you are coughing, wheezing, or having difficulty breathing,  do not wait to see if your symptoms go away on their own. Follow your asthma action plan.  If you have followed your asthma action plan and your symptoms are not improving, call your health care provider or seek immediate medical care at the nearest hospital. It is important to note how often you need to use your fast-acting  rescue inhaler. If you are using your rescue inhaler more often, it may mean that your asthma is not under control. Adjusting your asthma treatment plan may help you to prevent future asthma attacks and help you to gain better control of your condition. HOW CAN I PREVENT AN ASTHMA ATTACK WHEN I EXERCISE? Follow advice from your health care provider about whether you should use your fast-acting inhaler before exercising. Many people with asthma experience exercise-induced bronchoconstriction (EIB). This condition often worsens during vigorous exercise in cold, humid, or dry environments. Usually, people with EIB can stay very active by pre-treating with a fast-acting inhaler before exercising.   This information is not intended to replace advice given to you by your health care provider. Make sure you discuss any questions you have with your health care provider.   Document Released: 03/22/2009 Document Revised: 12/23/2014 Document Reviewed: 09/03/2014 Elsevier Interactive Patient Education 2016 [employer] Inc[employer]  Asthma, Acute Bronchospasm Acute bronchospasm caused by asthma is also referred to as an asthma attack. Bronchospasm means your air passages become narrowed. The narrowing is caused by inflammation and tightening of the muscles in the air tubes (bronchi) in your lungs. This can make it hard to breathe or cause you to wheeze and cough. CAUSES Possible triggers are: 6. Animal dander from the skin, hair, or feathers of animals. 7. Dust mites contained in house dust. 8. Cockroaches. 9. Pollen from trees or grass. 10. Mold. 11. Cigarette or tobacco smoke. 12. Air pollutants such as dust, household cleaners, hair sprays, aerosol sprays, paint fumes, strong chemicals, or strong odors. 13. Cold air or weather changes. Cold air may trigger inflammation. Winds increase molds and pollens in the air. 14. Strong emotions such as crying or laughing hard. 15. Stress. 16. Certain medicines such as  aspirin or beta-blockers. 17. Sulfites in foods and drinks, such as dried fruits and wine. 18. Infections or inflammatory conditions, such as a flu, cold, or inflammation of the nasal membranes (rhinitis). 19. Gastroesophageal reflux disease (GERD). GERD is a condition where stomach acid backs up into your esophagus. 20. Exercise or strenuous activity. SIGNS AND SYMPTOMS   Wheezing.  Excessive coughing, particularly at night.  Chest tightness.  Shortness of breath. DIAGNOSIS  Your health care provider will ask you about your medical history and perform a physical exam. A chest X-ray or blood testing may be performed to look for other causes of your symptoms or other conditions that may have triggered your asthma attack. TREATMENT  Treatment is aimed at reducing inflammation and opening up the airways in your lungs. Most asthma attacks are treated with inhaled medicines. These include quick relief or rescue medicines (such as bronchodilators) and controller medicines (such as inhaled corticosteroids). These medicines are sometimes given through an inhaler or a nebulizer. Systemic steroid medicine taken by mouth or given through an IV tube also can be used to reduce the inflammation when an attack is moderate or severe. Antibiotic medicines are only used if a bacterial infection is present.  HOME CARE INSTRUCTIONS   Rest.  Drink plenty of liquids. This helps the mucus to remain thin and be easily coughed up. Only  use caffeine in moderation and do not use alcohol until you have recovered from your illness.  Do not smoke. Avoid being exposed to secondhand smoke.  You play a critical role in keeping yourself in good health. Avoid exposure to things that cause you to wheeze or to have breathing problems.  Keep your medicines up-to-date and available. Carefully follow your health care provider's treatment plan.  Take your medicine exactly as prescribed.  When pollen or pollution is bad,  keep windows closed and use an air conditioner or go to places with air conditioning.  Asthma requires careful medical care. See your health care provider for a follow-up as advised. If you are more than [redacted] weeks pregnant and you were prescribed any new medicines, let your obstetrician know about the visit and how you are doing. Follow up with your health care provider as directed.  After you have recovered from your asthma attack, make an appointment with your outpatient doctor to talk about ways to reduce the likelihood of future attacks. If you do not have a doctor who manages your asthma, make an appointment with a primary care doctor to discuss your asthma. SEEK IMMEDIATE MEDICAL CARE IF:   You are getting worse.  You have trouble breathing. If severe, call your local emergency services (911 in the [place]  You develop chest pain or discomfort.  You are vomiting.  You are not able to keep fluids down.  You are coughing up yellow, green, brown, or bloody sputum.  You have a fever and your symptoms suddenly get worse.  You have trouble swallowing. MAKE SURE YOU:   Understand these instructions.  Will watch your condition.  Will get help right away if you are not doing well or get worse.   This information is not intended to replace advice given to you by your health care provider. Make sure you discuss any questions you have with your health care provider.   Document Released: 07/19/2006 Document Revised: 04/08/2013 Document Reviewed: 10/09/2012 Elsevier Interactive Patient Education 2016 [employer].  How to Use an Inhaler Using your inhaler correctly is very important. Good technique will make sure that the medicine reaches your lungs.  HOW TO USE AN INHALER: 21. Take the cap off the inhaler. 22. If this is the first time using your inhaler, you need to prime it. Shake the inhaler for 5 seconds. Release four puffs into the air, away from your face. Ask your doctor for  help if you have questions. 23. Shake the inhaler for 5 seconds. 24. Turn the inhaler so the bottle is above the mouthpiece. 25. Put your pointer finger on top of the bottle. Your thumb holds the bottom of the inhaler. 26. Open your mouth. 27. Either hold the inhaler away from your mouth (the width of 2 fingers) or place your lips tightly around the mouthpiece. Ask your doctor which way to use your inhaler. 28. Breathe out as much air as possible. 29. Breathe in and push down on the bottle 1 time to release the medicine. You will feel the medicine go in your mouth and throat. 30. Continue to take a deep breath in very slowly. Try to fill your lungs. 31. After you have breathed in completely, hold your breath for 10 seconds. This will help the medicine to settle in your lungs. If you cannot hold your breath for 10 seconds, hold it for as long as you can before you breathe out. 32. Breathe out slowly, through pursed lips. Whistling  is an example of pursed lips. 33. If your doctor has told you to take more than 1 puff, wait at least 15-30 seconds between puffs. This will help you get the best results from your medicine. Do not use the inhaler more than your doctor tells you to. 34. Put the cap back on the inhaler. 35. Follow the directions from your doctor or from the inhaler package about cleaning the inhaler. If you use more than one inhaler, ask your doctor which inhalers to use and what order to use them in. Ask your doctor to help you figure out when you will need to refill your inhaler.  If you use a steroid inhaler, always rinse your mouth with water after your last puff, gargle and spit out the water. Do not swallow the water. GET HELP IF:  The inhaler medicine only partially helps to stop wheezing or shortness of breath.  You are having trouble using your inhaler.  You have some increase in thick spit (phlegm). GET HELP RIGHT AWAY IF:  The inhaler medicine does not help your wheezing  or shortness of breath or you have tightness in your chest.  You have dizziness, headaches, or fast heart rate.  You have chills, fever, or night sweats.  You have a large increase of thick spit, or your thick spit is bloody. MAKE SURE YOU:   Understand these instructions.  Will watch your condition.  Will get help right away if you are not doing well or get worse.   This information is not intended to replace advice given to you by your health care provider. Make sure you discuss any questions you have with your health care provider.   Document Released: 01/11/2008 Document Revised: 01/22/2013 Document Reviewed: 10/31/2012 Elsevier Interactive Patient Education [employer]

## 2015-04-23 NOTE — ED Notes (Signed)
C/o asthma flare up onset 2 days associated w/SOB, wheezing, prod cough Seen at [facility] ED for similar sx... Given Z-pack and cough syrup w/temp relief A&O x4... No acute distress.

## 2015-04-23 NOTE — ED Provider Notes (Signed)
CSN: [id number]     Arrival date & time 04/23/15  1646 History   First MD Initiated Contact with Patient 04/23/15 1843     Chief Complaint  Patient presents with  . Asthma   (Consider location/radiation/quality/duration/timing/severity/associated sxs/prior Treatment) HPI Comments: 40 year old female with a history of asthmas had a flareup for the past 2 days. This also occurred approximately a week and half ago when she was seen in the emergency department. At that time she was treated with nebulizers and sterile its. She felt better with sterile its but after they were completed her asthma exacerbated. She states she has been using her albuterol HFA and nebulizers at home. Her last nebulizer used was prior to coming to the urgent care.     Past Medical History  Diagnosis Date  . Asthma   . Bronchiectasis with acute exacerbation (HCC)    History reviewed. No pertinent past surgical history. No family history on file. Social History  Substance Use Topics  . Smoking status: Former Games developer  . Smokeless tobacco: Never Used  . Alcohol Use: No   OB History    No data available     Review of Systems  Constitutional: Positive for activity change. Negative for fever and fatigue.  HENT: Positive for congestion and postnasal drip. Negative for ear pain, sore throat and trouble swallowing.   Respiratory: Positive for cough, chest tightness, shortness of breath and wheezing.   Cardiovascular: Negative for chest pain.  Gastrointestinal: Negative.   Musculoskeletal: Negative.   Psychiatric/Behavioral: Negative.   All other systems reviewed and are negative.   Allergies  Review of patient's allergies indicates no known allergies.  Home Medications   Prior to Admission medications   Medication Sig Start Date End Date Taking? Authorizing Provider  albuterol (PROVENTIL HFA;VENTOLIN HFA) 108 (90 BASE) MCG/ACT inhaler Inhale 1-2 puffs into the lungs every 6 (six) hours as needed for wheezing  or shortness of breath. 12/14/14  Yes Heather Laisure, PA-C  azithromycin (ZITHROMAX Z-PAK) 250 MG tablet Take 1 tablet (250 mg total) by mouth daily. 500mg  PO day 1, then 250mg  PO days 205 04/01/15  Yes Roxy Horseman, PA-C  HYDROcodone-homatropine [facility]) 5-1.5 MG/5ML syrup Take 5 mLs by mouth every 6 (six) hours as needed for cough. 04/01/15  Yes Roxy Horseman, PA-C  albuterol (PROVENTIL HFA;VENTOLIN HFA) 108 (90 BASE) MCG/ACT inhaler Inhale 2 puffs into the lungs every 4 (four) hours as needed for wheezing or shortness of breath. 02/16/15   Kristen N Ward, DO  albuterol (PROVENTIL) (2.5 MG/3ML) 0.083% nebulizer solution Take 2.5 mg by nebulization every 6 (six) hours as needed for wheezing or shortness of breath.    Historical Provider, MD  amoxicillin (AMOXIL) 500 MG capsule Take 1 capsule (500 mg total) by mouth 3 (three) times daily. 03/07/15   Tharon Aquas, PA  budesonide-formoterol (SYMBICORT) 80-4.5 MCG/ACT inhaler Inhale 2 puffs into the lungs 2 (two) times daily. 04/23/15   Hayden Rasmussen, NP  predniSONE (DELTASONE) 20 MG tablet 3 Tabs PO Days 1-3, then 2 tabs PO Days 4-6, then 1 tab PO Day 7-9, then Half Tab PO Day 10-12 04/23/15   Hayden Rasmussen, NP   Meds Ordered and Administered this Visit   Medications  ipratropium-albuterol (DUONEB) 0.5-2.5 (3) MG/3ML nebulizer solution 3 mL (3 mLs Nebulization Given 04/23/15 1914)  albuterol (PROVENTIL) (2.5 MG/3ML) 0.083% nebulizer solution 2.5 mg (2.5 mg Nebulization Given 04/23/15 1914)  dexamethasone (DECADRON) injection 10 mg (10 mg Intramuscular Given 04/23/15 1914)  BP 139/87 mmHg  Pulse 70  Temp(Src) 98 F (36.7 C) (Oral)  Resp 16  SpO2 96%  LMP 02/17/2015 No data found.   Physical Exam  Constitutional: She is oriented to person, place, and time. She appears well-developed and well-nourished. No distress.  HENT:  Mouth/Throat: No oropharyngeal exudate.  Oropharynx with minimal erythema. Positive for clear PND. No exudates.  Eyes:  Conjunctivae and EOM are normal.  Neck: Normal range of motion. Neck supple.  Cardiovascular: Normal rate, regular rhythm and normal heart sounds.   Pulmonary/Chest: She has wheezes.  Mildly increased respiratory effort. Expiratory wheezes during the entire phase. Prolonged expiratory phase. Speaking in complete sentences. In no distress at rest.  Musculoskeletal: Normal range of motion. She exhibits no edema.  Lymphadenopathy:    She has no cervical adenopathy.  Neurological: She is alert and oriented to person, place, and time. No cranial nerve deficit. She exhibits normal muscle tone.  Skin: Skin is warm and dry. No rash noted.  Nursing note and vitals reviewed.   ED Course  Procedures (including critical care time)  Labs Review Labs Reviewed - No data to display  Imaging Review No results found.   Visual Acuity Review  Right Eye Distance:   Left Eye Distance:   Bilateral Distance:    Right Eye Near:   Left Eye Near:    Bilateral Near:         MDM   1. Asthma exacerbation attacks, moderate persistent   2. PND (post-nasal drip)    patient received a DuoNeb 5 mg/2.5 mg and received substantial relief. She states she is breathing better. She does have improved air movement and decrease in wheeze. She states she is ready to go home. She has albuterol nebulizers that she can use at home and will do so as directed. Use Allegra or Claritin or Zyrtec for drainage and help with asthma. Start the Symbicort. Use 2 times a day and NO MORE. Never use it to treat a flare up. Just the Albuterol. Start the Prednisone tomorrow as directed. Take with food. See your PCP soon. Return if worse      Hayden Rasmussenavid Aspynn Clover, NP 04/23/15 1942

## 2015-04-25 NOTE — ED Provider Notes (Signed)
CSN: [id number]     Arrival date & time 04/23/15  1646 History   First MD Initiated Contact with Patient 04/23/15 1843     Chief Complaint  Patient presents with  . Asthma   (Consider location/radiation/quality/duration/timing/severity/associated sxs/prior Treatment) HPI  Past Medical History  Diagnosis Date  . Asthma   . Bronchiectasis with acute exacerbation (HCC)    History reviewed. No pertinent past surgical history. No family history on file. Social History  Substance Use Topics  . Smoking status: Former Games developer  . Smokeless tobacco: Never Used  . Alcohol Use: No   OB History    No data available     Review of Systems  Allergies  Review of patient's allergies indicates no known allergies.  Home Medications   Prior to Admission medications   Medication Sig Start Date End Date Taking? Authorizing Provider  albuterol (PROVENTIL HFA;VENTOLIN HFA) 108 (90 BASE) MCG/ACT inhaler Inhale 1-2 puffs into the lungs every 6 (six) hours as needed for wheezing or shortness of breath. 12/14/14  Yes Heather Laisure, PA-C  azithromycin (ZITHROMAX Z-PAK) 250 MG tablet Take 1 tablet (250 mg total) by mouth daily. 500mg  PO day 1, then 250mg  PO days 205 04/01/15  Yes Roxy Horseman, PA-C  HYDROcodone-homatropine [facility]) 5-1.5 MG/5ML syrup Take 5 mLs by mouth every 6 (six) hours as needed for cough. 04/01/15  Yes Roxy Horseman, PA-C  albuterol (PROVENTIL HFA;VENTOLIN HFA) 108 (90 BASE) MCG/ACT inhaler Inhale 2 puffs into the lungs every 4 (four) hours as needed for wheezing or shortness of breath. 02/16/15   Kristen N Ward, DO  albuterol (PROVENTIL) (2.5 MG/3ML) 0.083% nebulizer solution Take 2.5 mg by nebulization every 6 (six) hours as needed for wheezing or shortness of breath.    Historical Provider, MD  amoxicillin (AMOXIL) 500 MG capsule Take 1 capsule (500 mg total) by mouth 3 (three) times daily. 03/07/15   Tharon Aquas, PA  budesonide-formoterol (SYMBICORT) 80-4.5 MCG/ACT  inhaler Inhale 2 puffs into the lungs 2 (two) times daily. 04/23/15   Hayden Rasmussen, NP  predniSONE (DELTASONE) 20 MG tablet 3 Tabs PO Days 1-3, then 2 tabs PO Days 4-6, then 1 tab PO Day 7-9, then Half Tab PO Day 10-12 04/23/15   Hayden Rasmussen, NP   Meds Ordered and Administered this Visit   Medications  ipratropium-albuterol (DUONEB) 0.5-2.5 (3) MG/3ML nebulizer solution 3 mL (3 mLs Nebulization Given 04/23/15 1914)  albuterol (PROVENTIL) (2.5 MG/3ML) 0.083% nebulizer solution 2.5 mg (2.5 mg Nebulization Given 04/23/15 1914)  dexamethasone (DECADRON) injection 10 mg (10 mg Intramuscular Given 04/23/15 1914)    BP 139/87 mmHg  Pulse 70  Temp(Src) 98 F (36.7 C) (Oral)  Resp 16  SpO2 96%  LMP 02/17/2015 No data found.   Physical Exam  ED Course  Procedures (including critical care time)  Labs Review Labs Reviewed - No data to display  Imaging Review No results found.   Visual Acuity Review  Right Eye Distance:   Left Eye Distance:   Bilateral Distance:    Right Eye Near:   Left Eye Near:    Bilateral Near:         MDM   1. Asthma exacerbation attacks, moderate persistent   2. PND (post-nasal drip)    Decadron 10 mg administered.   Patient is advised to continue home symptomatic treatment. Prescription for prednisone and Symbicort are   sent pharmacy patient has indicated. Patient is advised that if there are new or worsening symptoms or attend the  emergency department, or contact primary care provider. Instructions of care provided discharged home in stable condition.  THIS NOTE WAS GENERATED USING A VOICE RECOGNITION SOFTWARE PROGRAM. ALL REASONABLE EFFORTS  WERE MADE TO PROOFREAD THIS DOCUMENT FOR ACCURACY.   Tharon AquasFrank C Patrick, PA 04/25/15 1744

## 2015-06-01 ENCOUNTER — Other Ambulatory Visit: Payer: Self-pay | Admitting: Family Medicine

## 2015-06-01 DIAGNOSIS — Z1231 Encounter for screening mammogram for malignant neoplasm of breast: Secondary | ICD-10-CM

## 2015-06-11 ENCOUNTER — Ambulatory Visit: Payer: Medicaid Other

## 2016-01-23 ENCOUNTER — Encounter (HOSPITAL_COMMUNITY): Payer: Self-pay | Admitting: Emergency Medicine

## 2016-01-23 ENCOUNTER — Emergency Department (HOSPITAL_COMMUNITY)
Admission: EM | Admit: 2016-01-23 | Discharge: 2016-01-23 | Disposition: A | Discharge status: Home / Self Care | Payer: Medicaid Other | Attending: Emergency Medicine | Admitting: Emergency Medicine

## 2016-01-23 DIAGNOSIS — Z79899 Other long term (current) drug therapy: Secondary | ICD-10-CM | POA: Diagnosis not present

## 2016-01-23 DIAGNOSIS — J4521 Mild intermittent asthma with (acute) exacerbation: Secondary | ICD-10-CM | POA: Diagnosis not present

## 2016-01-23 DIAGNOSIS — Z87891 Personal history of nicotine dependence: Secondary | ICD-10-CM | POA: Diagnosis not present

## 2016-01-23 DIAGNOSIS — J45909 Unspecified asthma, uncomplicated: Secondary | ICD-10-CM | POA: Diagnosis present

## 2016-01-23 MED ORDER — ALBUTEROL SULFATE HFA 108 (90 BASE) MCG/ACT IN AERS
2.0000 | INHALATION_SPRAY | RESPIRATORY_TRACT | Status: DC | PRN
  Administered 2016-01-23: 2 via RESPIRATORY_TRACT
  Filled 2016-01-23: qty 6.7

## 2016-01-23 MED ORDER — ALBUTEROL SULFATE (2.5 MG/3ML) 0.083% IN NEBU
INHALATION_SOLUTION | RESPIRATORY_TRACT | Status: AC
  Filled 2016-01-23: qty 6

## 2016-01-23 MED ORDER — PREDNISONE 20 MG PO TABS
60.0000 mg | ORAL_TABLET | Freq: Once | ORAL | Status: AC
  Administered 2016-01-23: 60 mg via ORAL
  Filled 2016-01-23: qty 3

## 2016-01-23 MED ORDER — ALBUTEROL SULFATE (2.5 MG/3ML) 0.083% IN NEBU
5.0000 mg | INHALATION_SOLUTION | Freq: Once | RESPIRATORY_TRACT | Status: AC
  Administered 2016-01-23: 5 mg via RESPIRATORY_TRACT

## 2016-01-23 MED ORDER — PREDNISONE 10 MG PO TABS: 20.0000 mg | ORAL_TABLET | Freq: Two times a day (BID) | ORAL | 0 refills | Status: DC

## 2016-01-23 NOTE — ED Triage Notes (Signed)
Pt states "my asthma is flaring up and I dont have an inhaler". Pt coughing and wheezing in triage. Resp e/u

## 2016-01-23 NOTE — ED Notes (Signed)
See providers assessment.  

## 2016-01-23 NOTE — Discharge Instructions (Signed)
Follow up with your doctor or return for worsening symptoms. Use your inhaler as needed, take the prednisone and take a decongestant.

## 2016-01-23 NOTE — ED Provider Notes (Signed)
[facility] Provider Note   CSN: [id number] Arrival date & time: 01/23/16  2154   By signing my name below, I, Clovis PuAvnee Patel, attest that this documentation has been prepared under the direction and in the presence of  Kerrie BuffaloHope Jade Burkard, NP. Electronically Signed: Clovis PuAvnee Patel, ED Scribe. 01/23/16. 11:03 PM.   History   Chief Complaint Chief Complaint  Patient presents with  . Asthma   The history is provided by the patient. No language interpreter was used.  Asthma  This is a chronic problem. The current episode started more than 2 days ago. The problem has been gradually worsening. Associated symptoms include headaches. Pertinent negatives include no chest pain and no abdominal pain. Nothing aggravates the symptoms. Relieved by: breathing treatments.   HPI Comments:  Wendy Morgan is a 40 y.o. female, with a hx of asthma, who presents to the Emergency Department complaining of a worsening episode of asthma x 3 days. She notes associated rhinorrhea, cough with yellow/green phlegm, wheezing, headaches and chest pain which she has been experiencing for 3 days . Pt has run out of her inhaler. She denies fevers and chills.  Pt was given 1 breathing treatment in ED with relief. She denies any other complaints at this time.   Past Medical History:  Diagnosis Date  . Asthma   . Bronchiectasis with acute exacerbation (HCC)     There are no active problems to display for this patient.   History reviewed. No pertinent surgical history.  OB History    No data available       Home Medications    Prior to Admission medications   Medication Sig Start Date End Date Taking? Authorizing Provider  albuterol (PROVENTIL HFA;VENTOLIN HFA) 108 (90 BASE) MCG/ACT inhaler Inhale 1-2 puffs into the lungs every 6 (six) hours as needed for wheezing or shortness of breath. 12/14/14   Santiago GladHeather Laisure, PA-C  albuterol (PROVENTIL HFA;VENTOLIN HFA) 108 (90 BASE) MCG/ACT inhaler Inhale 2 puffs into the  lungs every 4 (four) hours as needed for wheezing or shortness of breath. 02/16/15   Kristen N Ward, DO  albuterol (PROVENTIL) (2.5 MG/3ML) 0.083% nebulizer solution Take 2.5 mg by nebulization every 6 (six) hours as needed for wheezing or shortness of breath.    Historical Provider, MD  amoxicillin (AMOXIL) 500 MG capsule Take 1 capsule (500 mg total) by mouth 3 (three) times daily. 03/07/15   Tharon AquasFrank C Patrick, PA  azithromycin (ZITHROMAX Z-PAK) 250 MG tablet Take 1 tablet (250 mg total) by mouth daily. 500mg  PO day 1, then 250mg  PO days 205 04/01/15   Roxy Horsemanobert Browning, PA-C  budesonide-formoterol [facility](SYMBICORT) 80-4.5 MCG/ACT inhaler Inhale 2 puffs into the lungs 2 (two) times daily. 04/23/15   Hayden Rasmussenavid Mabe, NP  HYDROcodone-homatropine [facility](HYCODAN) 5-1.5 MG/5ML syrup Take 5 mLs by mouth every 6 (six) hours as needed for cough. 04/01/15   Roxy Horsemanobert Browning, PA-C  predniSONE (DELTASONE) 10 MG tablet Take 2 tablets (20 mg total) by mouth 2 (two) times daily with a meal. 01/23/16   Michela Herst Orlene OchM Yarexi Pawlicki, NP    Family History No family history on file.  Social History Social History  Substance Use Topics  . Smoking status: Former Games developermoker  . Smokeless tobacco: Never Used  . Alcohol use No     Allergies   Review of patient's allergies indicates no known allergies.   Review of Systems Review of Systems  Constitutional: Negative for chills and fever.  HENT: Positive for congestion and rhinorrhea.   Respiratory: Positive  for cough and wheezing.   Cardiovascular: Negative for chest pain.  Gastrointestinal: Negative for abdominal pain.  Musculoskeletal: Negative for neck pain.  Skin: Negative for rash.  Neurological: Positive for headaches.  Psychiatric/Behavioral: The patient is not nervous/anxious.      Physical Exam Updated Vital Signs BP 126/78   Pulse 70   Temp 98.2 F (36.8 C) (Oral)   Resp 16   Ht 5\' 4"  (1.626 m)   Wt 92.6 kg   LMP 01/22/2016   SpO2 96%   BMI 35.03 kg/m   Physical Exam    Constitutional: She is oriented to person, place, and time. She appears well-developed and well-nourished. No distress.  HENT:  Head: Normocephalic and atraumatic.  Right Ear: Tympanic membrane normal.  Left Ear: Tympanic membrane normal.  Uvula midline. No edema or erythema.  Eyes: Conjunctivae are normal. Pupils are equal, round, and reactive to light.  Cardiovascular: Normal rate.   Pulmonary/Chest: Effort normal. She has wheezes (occasional). She has no rhonchi. She has no rales.  Abdominal: She exhibits no distension.  Lymphadenopathy:    She has no cervical adenopathy.  Neurological: She is alert and oriented to person, place, and time.  Skin: Skin is warm and dry.  Psychiatric: She has a normal mood and affect.  Nursing note and vitals reviewed.    ED Treatments / Results  DIAGNOSTIC STUDIES:  Oxygen Saturation is 96% on RA, normal by my interpretation.    COORDINATION OF CARE:  11:00 PM Discussed treatment plan with pt at bedside and pt agreed to plan.  Labs (all labs ordered are listed, but only abnormal results are displayed) Labs Reviewed - No data to display  Radiology No results found.  Procedures Procedures (including critical care time)  Medications Ordered in ED Medications  albuterol (PROVENTIL) (2.5 MG/3ML) 0.083% nebulizer solution 5 mg (5 mg Nebulization Given 01/23/16 2204)  predniSONE (DELTASONE) tablet 60 mg (60 mg Oral Given 01/23/16 2305)     Initial Impression / Assessment and Plan / ED Course  I have reviewed the triage vital signs and the nursing notes.  Pertinent labs & imaging results that were available during my care of the patient were reviewed by me and considered in my medical decision making (see chart for details).  Clinical Course    Patient ambulated in ED with O2 saturations maintained >90, no current signs of respiratory distress. Lung exam improved after nebulizer treatment. Pt has been instructed to continue using  prescribed medications and to speak with PCP about today's exacerbation.    Final Clinical Impressions(s) / ED Diagnoses   Final diagnoses:  Mild intermittent asthma with exacerbation    New Prescriptions Discharge Medication List as of 01/23/2016 11:03 PM    I personally performed the services described in this documentation, which was scribed in my presence. The recorded information has been reviewed and is accurate.     [address] [place], [place] 01/25/16 2051    Alvira Monday, MD 01/30/16 1721

## 2016-03-06 ENCOUNTER — Ambulatory Visit (HOSPITAL_COMMUNITY)
Admission: EM | Admit: 2016-03-06 | Discharge: 2016-03-06 | Disposition: A | Discharge status: Home / Self Care | Payer: Medicaid Other | Attending: Emergency Medicine | Admitting: Emergency Medicine

## 2016-03-06 ENCOUNTER — Encounter (HOSPITAL_COMMUNITY): Payer: Self-pay | Admitting: Emergency Medicine

## 2016-03-06 DIAGNOSIS — J4521 Mild intermittent asthma with (acute) exacerbation: Secondary | ICD-10-CM | POA: Diagnosis not present

## 2016-03-06 MED ORDER — PREDNISONE 20 MG PO TABS: ORAL_TABLET | ORAL | 0 refills | Status: DC

## 2016-03-06 MED ORDER — METHYLPREDNISOLONE ACETATE 80 MG/ML IJ SUSP
80.0000 mg | Freq: Once | INTRAMUSCULAR | Status: AC
  Administered 2016-03-06: 80 mg via INTRAMUSCULAR

## 2016-03-06 MED ORDER — METHYLPREDNISOLONE ACETATE 80 MG/ML IJ SUSP
INTRAMUSCULAR | Status: AC
  Filled 2016-03-06: qty 1

## 2016-03-06 MED ORDER — IPRATROPIUM-ALBUTEROL 0.5-2.5 (3) MG/3ML IN SOLN
3.0000 mL | Freq: Once | RESPIRATORY_TRACT | Status: AC
  Administered 2016-03-06: 3 mL via RESPIRATORY_TRACT

## 2016-03-06 MED ORDER — IPRATROPIUM-ALBUTEROL 0.5-2.5 (3) MG/3ML IN SOLN
RESPIRATORY_TRACT | Status: AC
Start: 2016-03-06 — End: 2016-03-06
  Filled 2016-03-06: qty 3

## 2016-03-06 NOTE — ED Triage Notes (Signed)
Pt reports a history of asthma that has worsened over the last week. PT also reports a productive cough. PT has been using her inhaler at home without relief.

## 2016-03-06 NOTE — ED Provider Notes (Signed)
CSN: [id number]     Arrival date & time 03/06/16  1645 History   First MD Initiated Contact with Patient 03/06/16 1833     Chief Complaint  Patient presents with  . Asthma  . Cough   (Consider location/radiation/quality/duration/timing/severity/associated sxs/prior Treatment) HPI Wendy Morgan is a 40 y.o. female presenting to UC with c/o gradually worsening asthma exacerbation over the last 1 week, associated cough, chest tightness, wheeze.  Mild intermittent productive cough with clear sputum.  She has used her inhaler at home without relief. She has used prednisone in the past for her asthma. Denies fever, chills, n/v/d.        Past Medical History:  Diagnosis Date  . Asthma   . Bronchiectasis with acute exacerbation (HCC)    History reviewed. No pertinent surgical history. No family history on file. Social History  Substance Use Topics  . Smoking status: Former Games developermoker  . Smokeless tobacco: Never Used  . Alcohol use No   OB History    No data available     Review of Systems  Constitutional: Negative for chills and fever.  HENT: Positive for congestion. Negative for ear pain, sore throat, trouble swallowing and voice change.   Respiratory: Positive for cough, chest tightness, shortness of breath and wheezing.   Cardiovascular: Negative for chest pain and palpitations.  Gastrointestinal: Negative for abdominal pain, diarrhea, nausea and vomiting.  Musculoskeletal: Negative for arthralgias, back pain and myalgias.  Skin: Negative for rash.    Allergies  Patient has no known allergies.  Home Medications   Prior to Admission medications   Medication Sig Start Date End Date Taking? Authorizing Provider  albuterol (PROVENTIL HFA;VENTOLIN HFA) 108 (90 BASE) MCG/ACT inhaler Inhale 1-2 puffs into the lungs every 6 (six) hours as needed for wheezing or shortness of breath. 12/14/14  Yes Heather Laisure, PA-C  albuterol (PROVENTIL HFA;VENTOLIN HFA) 108 (90 BASE) MCG/ACT  inhaler Inhale 2 puffs into the lungs every 4 (four) hours as needed for wheezing or shortness of breath. 02/16/15   Kristen N Ward, DO  albuterol (PROVENTIL) (2.5 MG/3ML) 0.083% nebulizer solution Take 2.5 mg by nebulization every 6 (six) hours as needed for wheezing or shortness of breath.    Historical Provider, MD  amoxicillin (AMOXIL) 500 MG capsule Take 1 capsule (500 mg total) by mouth 3 (three) times daily. 03/07/15   Tharon AquasFrank C Patrick, PA  azithromycin (ZITHROMAX Z-PAK) 250 MG tablet Take 1 tablet (250 mg total) by mouth daily. 500mg  PO day 1, then 250mg  PO days 205 04/01/15   Roxy Horsemanobert Browning, PA-C  budesonide-formoterol [facility](SYMBICORT) 80-4.5 MCG/ACT inhaler Inhale 2 puffs into the lungs 2 (two) times daily. 04/23/15   Hayden Rasmussenavid Mabe, NP  HYDROcodone-homatropine [facility](HYCODAN) 5-1.5 MG/5ML syrup Take 5 mLs by mouth every 6 (six) hours as needed for cough. 04/01/15   Roxy Horsemanobert Browning, PA-C  predniSONE (DELTASONE) 20 MG tablet 3 tabs po day one, then 2 po daily x 4 days 03/06/16   Junius FinnerErin O'Malley, PA-C   Meds Ordered and Administered this Visit   Medications  methylPREDNISolone acetate (DEPO-MEDROL) injection 80 mg (80 mg Intramuscular Given 03/06/16 1844)  ipratropium-albuterol (DUONEB) 0.5-2.5 (3) MG/3ML nebulizer solution 3 mL (3 mLs Nebulization Given 03/06/16 1844)    BP 123/88   Pulse 63   Temp 98.5 F (36.9 C) (Oral)   Resp 16   Ht 5\' 4"  (1.626 m)   Wt 204 lb (92.5 kg)   LMP 01/16/2016   SpO2 96%   BMI 35.02 kg/m  No data found.   Physical Exam  Constitutional: She appears well-developed and well-nourished. No distress.  HENT:  Head: Normocephalic and atraumatic.  Right Ear: Tympanic membrane normal.  Left Ear: Tympanic membrane normal.  Nose: Nose normal.  Mouth/Throat: Uvula is midline, oropharynx is clear and moist and mucous membranes are normal.  Eyes: Conjunctivae are normal. No scleral icterus.  Neck: Normal range of motion. Neck supple.  Cardiovascular: Normal rate, regular  rhythm and normal heart sounds.   Pulmonary/Chest: Effort normal. No respiratory distress. She has wheezes. She has no rales. She exhibits no tenderness.  Diffuse expiratory wheeze  Abdominal: Soft. She exhibits no distension. There is no tenderness.  Musculoskeletal: Normal range of motion.  Neurological: She is alert.  Skin: Skin is warm and dry. She is not diaphoretic.  Nursing note and vitals reviewed.   Urgent Care Course   Clinical Course     Procedures (including critical care time)  Labs Review Labs Reviewed - No data to display  Imaging Review No results found.   MDM   1. Mild intermittent asthma with exacerbation    Worsening cough and wheeze for 1 week.  Wheeze on exam. O2 Sat 96% on RA.   Depomedrol and Duoneb treatment given in UC Wheeze resolved.  Rx: Prednisone for 5 days. Pt has albuterol inhaler at home. F/u with PCP as needed.     Junius Finnerrin O'Malley, PA-C 03/06/16 1932

## 2016-03-06 NOTE — Discharge Instructions (Signed)
°  You were given a shot of depomedrol (a steroid) today to help with swelling in your airways and help with breathing.  You have been prescribed prednisone, an oral steroid.  You may start this medication tomorrow with breakfast.

## 2016-04-01 ENCOUNTER — Ambulatory Visit (HOSPITAL_COMMUNITY)
Admission: EM | Admit: 2016-04-01 | Discharge: 2016-04-01 | Disposition: A | Discharge status: Home / Self Care | Payer: Medicaid Other | Attending: Family Medicine | Admitting: Family Medicine

## 2016-04-01 ENCOUNTER — Encounter (HOSPITAL_COMMUNITY): Payer: Self-pay | Admitting: Emergency Medicine

## 2016-04-01 DIAGNOSIS — J4520 Mild intermittent asthma, uncomplicated: Secondary | ICD-10-CM | POA: Diagnosis not present

## 2016-04-01 MED ORDER — FLUTICASONE-SALMETEROL 250-50 MCG/DOSE IN AEPB: 1.0000 | INHALATION_SPRAY | Freq: Two times a day (BID) | RESPIRATORY_TRACT | 2 refills | Status: DC

## 2016-04-01 MED ORDER — IPRATROPIUM-ALBUTEROL 0.5-2.5 (3) MG/3ML IN SOLN
RESPIRATORY_TRACT | Status: AC
  Filled 2016-04-01: qty 3

## 2016-04-01 MED ORDER — METHYLPREDNISOLONE SODIUM SUCC 125 MG IJ SOLR
INTRAMUSCULAR | Status: AC
  Filled 2016-04-01: qty 2

## 2016-04-01 MED ORDER — SODIUM CHLORIDE 0.9 % IN NEBU
INHALATION_SOLUTION | RESPIRATORY_TRACT | Status: AC
  Filled 2016-04-01: qty 3

## 2016-04-01 MED ORDER — METHYLPREDNISOLONE SODIUM SUCC 125 MG IJ SOLR
125.0000 mg | Freq: Once | INTRAMUSCULAR | Status: AC
  Administered 2016-04-01: 125 mg via INTRAMUSCULAR

## 2016-04-01 MED ORDER — ALBUTEROL SULFATE (2.5 MG/3ML) 0.083% IN NEBU
INHALATION_SOLUTION | RESPIRATORY_TRACT | Status: AC
  Filled 2016-04-01: qty 3

## 2016-04-01 MED ORDER — ALBUTEROL SULFATE (2.5 MG/3ML) 0.083% IN NEBU
5.0000 mg | INHALATION_SOLUTION | Freq: Once | RESPIRATORY_TRACT | Status: AC
  Administered 2016-04-01: 5 mg via RESPIRATORY_TRACT

## 2016-04-01 MED ORDER — IPRATROPIUM BROMIDE 0.02 % IN SOLN
0.5000 mg | Freq: Once | RESPIRATORY_TRACT | Status: AC
  Administered 2016-04-01: 0.5 mg via RESPIRATORY_TRACT

## 2016-04-01 NOTE — ED Provider Notes (Signed)
[facility]    CSN: [id number] Arrival date & time: 04/01/16  1310     History   Chief Complaint Chief Complaint  Patient presents with  . Asthma    HPI Wendy Morgan is a 40 y.o. female.   The history is provided by the patient.  Asthma  This is a recurrent problem. The current episode started more than 1 week ago. The problem has been gradually worsening. Associated symptoms include shortness of breath. The symptoms are aggravated by exertion.    Past Medical History:  Diagnosis Date  . Asthma   . Bronchiectasis with acute exacerbation (HCC)     There are no active problems to display for this patient.   History reviewed. No pertinent surgical history.  OB History    No data available       Home Medications    Prior to Admission medications   Medication Sig Start Date End Date Taking? Authorizing Provider  albuterol (PROVENTIL HFA;VENTOLIN HFA) 108 (90 BASE) MCG/ACT inhaler Inhale 1-2 puffs into the lungs every 6 (six) hours as needed for wheezing or shortness of breath. 12/14/14   Santiago GladHeather Laisure, PA-C  albuterol (PROVENTIL HFA;VENTOLIN HFA) 108 (90 BASE) MCG/ACT inhaler Inhale 2 puffs into the lungs every 4 (four) hours as needed for wheezing or shortness of breath. 02/16/15   Kristen N Ward, DO  albuterol (PROVENTIL) (2.5 MG/3ML) 0.083% nebulizer solution Take 2.5 mg by nebulization every 6 (six) hours as needed for wheezing or shortness of breath.    Historical Provider, MD  amoxicillin (AMOXIL) 500 MG capsule Take 1 capsule (500 mg total) by mouth 3 (three) times daily. Patient not taking: Reported on 04/01/2016 03/07/15   Tharon AquasFrank C Patrick, PA  azithromycin (ZITHROMAX Z-PAK) 250 MG tablet Take 1 tablet (250 mg total) by mouth daily. 500mg  PO day 1, then 250mg  PO days 205 Patient not taking: Reported on 04/01/2016 04/01/15   Roxy Horsemanobert Browning, PA-C  budesonide-formoterol [facility](SYMBICORT) 80-4.5 MCG/ACT inhaler Inhale 2 puffs into the lungs 2 (two) times  daily. Patient not taking: Reported on 04/01/2016 04/23/15   Hayden Rasmussenavid Mabe, NP  HYDROcodone-homatropine [facility](HYCODAN) 5-1.5 MG/5ML syrup Take 5 mLs by mouth every 6 (six) hours as needed for cough. Patient not taking: Reported on 04/01/2016 04/01/15   Roxy Horsemanobert Browning, PA-C  predniSONE (DELTASONE) 20 MG tablet 3 tabs po day one, then 2 po daily x 4 days Patient not taking: Reported on 04/01/2016 03/06/16   Junius FinnerErin O'Malley, PA-C    Family History No family history on file.  Social History Social History  Substance Use Topics  . Smoking status: Former Games developermoker  . Smokeless tobacco: Never Used  . Alcohol use No     Allergies   Patient has no known allergies.   Review of Systems Review of Systems  Constitutional: Negative.   HENT: Negative.   Respiratory: Positive for shortness of breath and wheezing.   Cardiovascular: Negative.   Gastrointestinal: Negative.   All other systems reviewed and are negative.    Physical Exam Triage Vital Signs ED Triage Vitals [04/01/16 1445]  Enc Vitals Group     BP 118/84     Pulse Rate 73     Resp 24     Temp 97.9 F (36.6 C)     Temp Source Oral     SpO2 94 %     Weight      Height      Head Circumference      Peak Flow  Pain Score      Pain Loc      Pain Edu?      Excl. in GC?    No data found.   Updated Vital Signs BP 118/84 (BP Location: Left Arm)   Pulse 73   Temp 97.9 F (36.6 C) (Oral)   Resp 24   LMP 01/16/2016   SpO2 94%   Visual Acuity Right Eye Distance:   Left Eye Distance:   Bilateral Distance:    Right Eye Near:   Left Eye Near:    Bilateral Near:     Physical Exam  Constitutional: She is oriented to person, place, and time. She appears well-developed and well-nourished. No distress.  HENT:  Right Ear: External ear normal.  Left Ear: External ear normal.  Nose: Nose normal.  Mouth/Throat: Oropharynx is clear and moist.  Eyes: Pupils are equal, round, and reactive to light.  Neck: Normal range of  motion. Neck supple.  Cardiovascular: Normal rate, regular rhythm, normal heart sounds and intact distal pulses.   Pulmonary/Chest: Effort normal. No respiratory distress. She has wheezes.  Neurological: She is alert and oriented to person, place, and time.  Skin: Skin is warm and dry.  Nursing note and vitals reviewed.    UC Treatments / Results  Labs (all labs ordered are listed, but only abnormal results are displayed) Labs Reviewed - No data to display  EKG  EKG Interpretation None       Radiology No results found.  Procedures Procedures (including critical care time)  Medications Ordered in UC Medications - No data to display   Initial Impression / Assessment and Plan / UC Course  I have reviewed the triage vital signs and the nursing notes.  Pertinent labs & imaging results that were available during my care of the patient were reviewed by me and considered in my medical decision making (see chart for details).  Clinical Course     Sx improved and lungs clear throughout after neb and sloumedrol, will d/c on advair.  Final Clinical Impressions(s) / UC Diagnoses   Final diagnoses:  None    New Prescriptions New Prescriptions   No medications on file     Linna HoffJames D Kindl, MD 04/01/16 1549

## 2016-04-01 NOTE — ED Notes (Signed)
Breathing treatment in progress

## 2016-04-01 NOTE — ED Triage Notes (Addendum)
Patient reports feeling weak, wheezing, trouble breathing.  unknown fever.   Denies runny nose, sore throat or ear pain.  Audible wheezing, speaking in complete sentences.

## 2016-05-19 ENCOUNTER — Emergency Department (HOSPITAL_COMMUNITY)
Admission: EM | Admit: 2016-05-19 | Discharge: 2016-05-20 | Disposition: A | Discharge status: Home / Self Care | Payer: Medicaid Other | Attending: Emergency Medicine | Admitting: Emergency Medicine

## 2016-05-19 ENCOUNTER — Encounter (HOSPITAL_COMMUNITY): Payer: Self-pay | Admitting: Emergency Medicine

## 2016-05-19 ENCOUNTER — Emergency Department (HOSPITAL_COMMUNITY): Payer: Medicaid Other

## 2016-05-19 DIAGNOSIS — R0981 Nasal congestion: Secondary | ICD-10-CM | POA: Diagnosis present

## 2016-05-19 DIAGNOSIS — Z87891 Personal history of nicotine dependence: Secondary | ICD-10-CM | POA: Diagnosis not present

## 2016-05-19 DIAGNOSIS — J4531 Mild persistent asthma with (acute) exacerbation: Secondary | ICD-10-CM | POA: Insufficient documentation

## 2016-05-19 DIAGNOSIS — J069 Acute upper respiratory infection, unspecified: Secondary | ICD-10-CM | POA: Insufficient documentation

## 2016-05-19 DIAGNOSIS — Z79899 Other long term (current) drug therapy: Secondary | ICD-10-CM | POA: Diagnosis not present

## 2016-05-19 DIAGNOSIS — J4530 Mild persistent asthma, uncomplicated: Secondary | ICD-10-CM

## 2016-05-19 MED ORDER — LORATADINE 10 MG PO TABS
10.0000 mg | ORAL_TABLET | Freq: Once | ORAL | Status: AC
  Administered 2016-05-19: 10 mg via ORAL
  Filled 2016-05-19: qty 1

## 2016-05-19 MED ORDER — FLUTICASONE PROPIONATE 50 MCG/ACT NA SUSP
2.0000 | Freq: Every day | NASAL | Status: DC
  Filled 2016-05-19: qty 16

## 2016-05-19 MED ORDER — IPRATROPIUM BROMIDE 0.02 % IN SOLN
0.5000 mg | Freq: Once | RESPIRATORY_TRACT | Status: AC
  Administered 2016-05-19: 0.5 mg via RESPIRATORY_TRACT
  Filled 2016-05-19: qty 2.5

## 2016-05-19 MED ORDER — PREDNISONE 20 MG PO TABS
60.0000 mg | ORAL_TABLET | Freq: Once | ORAL | Status: AC
  Administered 2016-05-19: 60 mg via ORAL
  Filled 2016-05-19: qty 3

## 2016-05-19 MED ORDER — ALBUTEROL SULFATE (2.5 MG/3ML) 0.083% IN NEBU
INHALATION_SOLUTION | RESPIRATORY_TRACT | Status: AC
  Filled 2016-05-19: qty 3

## 2016-05-19 MED ORDER — IBUPROFEN 800 MG PO TABS
800.0000 mg | ORAL_TABLET | Freq: Once | ORAL | Status: AC
  Administered 2016-05-19: 800 mg via ORAL
  Filled 2016-05-19: qty 1

## 2016-05-19 MED ORDER — ALBUTEROL SULFATE (2.5 MG/3ML) 0.083% IN NEBU
5.0000 mg | INHALATION_SOLUTION | Freq: Once | RESPIRATORY_TRACT | Status: AC
  Administered 2016-05-19: 5 mg via RESPIRATORY_TRACT

## 2016-05-19 MED ORDER — ALBUTEROL SULFATE (2.5 MG/3ML) 0.083% IN NEBU
5.0000 mg | INHALATION_SOLUTION | Freq: Once | RESPIRATORY_TRACT | Status: AC
  Administered 2016-05-19: 5 mg via RESPIRATORY_TRACT
  Filled 2016-05-19: qty 6

## 2016-05-19 NOTE — ED Provider Notes (Signed)
[facility] Provider Note   CSN: [id number] Arrival date & time: 05/19/16  2154    History   Chief Complaint Chief Complaint  Patient presents with  . Otalgia  . Asthma  . cough    HPI Wendy Morgan is a 41 y.o. female.  41 year old female with a history of asthma presents to the emergency department for evaluation of upper respiratory symptoms. She reports nasal congestion as well as a cough. Symptoms present for 3-4 days. She has noticed an aching pain in her left ear as well. She states that she thinks there is fluid behind her eardrum. She has not had any discharge from her ear. No fevers, nausea, vomiting, diarrhea. She has been using her rescue inhaler for some wheezing associated with her upper respiratory symptoms and cough. This has been providing little relief. No syncope or near-syncope prior to arrival. She states that her shortness of breath and wheezing feel consistent with past asthma exacerbations.   The history is provided by the patient. No language interpreter was used.  Otalgia   Asthma     Past Medical History:  Diagnosis Date  . Asthma   . Bronchiectasis with acute exacerbation (HCC)     There are no active problems to display for this patient.   History reviewed. No pertinent surgical history.  OB History    No data available       Home Medications    Prior to Admission medications   Medication Sig Start Date End Date Taking? Authorizing Provider  albuterol (PROVENTIL HFA;VENTOLIN HFA) 108 (90 BASE) MCG/ACT inhaler Inhale 2 puffs into the lungs every 4 (four) hours as needed for wheezing or shortness of breath. 02/16/15  Yes Kristen N Ward, DO  Fluticasone-Salmeterol (ADVAIR) 250-50 MCG/DOSE AEPB Inhale 1 puff into the lungs 2 (two) times daily. Please instruct in usage 04/01/16  Yes Linna Hoff, MD  ibuprofen (ADVIL,MOTRIN) 600 MG tablet Take 1 tablet (600 mg total) by mouth every 6 (six) hours as needed. 05/20/16   Antony Madura,  PA-C  loratadine (CLARITIN) 10 MG tablet Take 1 tablet (10 mg total) by mouth daily. 05/20/16   Antony Madura, PA-C  predniSONE (DELTASONE) 20 MG tablet Take 2 tablets (40 mg total) by mouth daily. 05/20/16   Antony Madura, PA-C    Family History History reviewed. No pertinent family history.  Social History Social History  Substance Use Topics  . Smoking status: Former Games developer  . Smokeless tobacco: Never Used  . Alcohol use No     Allergies   Patient has no known allergies.   Review of Systems Review of Systems  HENT: Positive for ear pain.    Ten systems reviewed and are negative for acute change, except as noted in the HPI.    Physical Exam Updated Vital Signs BP 119/75 (BP Location: Right Arm)   Pulse 74   Temp 97.7 F (36.5 C) (Oral)   Resp 20   Ht 5\' 4"  (1.626 m)   Wt 92.5 kg   LMP 04/26/2016   SpO2 98%   BMI 35.02 kg/m   Physical Exam  Constitutional: She is oriented to person, place, and time. She appears well-developed and well-nourished. No distress.  Nontoxic appearing and in NAD  HENT:  Head: Normocephalic and atraumatic.  Mild injection to the left tympanic membrane. No bulging, retraction, or perforation. Cone of light preserved in the left ear. No external ear tenderness. Right external ear, canal, and TM are normal. Audible nasal congestion.  Eyes: Conjunctivae and EOM are normal. No scleral icterus.  Neck: Normal range of motion.  Cardiovascular: Normal rate, regular rhythm and intact distal pulses.   Pulmonary/Chest: Effort normal. No respiratory distress. She has wheezes. She has no rales.  Faint expiratory wheeze diffusely. No tachypnea or dyspnea. Chest expansion symmetric. No rales or rhonchi.  Abdominal: Soft. She exhibits no distension.  Musculoskeletal: Normal range of motion.  Neurological: She is alert and oriented to person, place, and time. She exhibits normal muscle tone. Coordination normal.  GCS 15. Patient moving all extremities.  Skin:  Skin is warm and dry. No rash noted. She is not diaphoretic. No erythema. No pallor.  Psychiatric: She has a normal mood and affect. Her behavior is normal.  Nursing note and vitals reviewed.    ED Treatments / Results  Labs (all labs ordered are listed, but only abnormal results are displayed) Labs Reviewed - No data to display  EKG  EKG Interpretation None       Radiology Dg Chest 2 View  Result Date: 05/19/2016 CLINICAL DATA:  Shortness of breath.  Asthma. EXAM: CHEST  2 VIEW COMPARISON:  Radiograph 04/06/2015 FINDINGS: The cardiomediastinal contours are normal. Mild bronchial thickening, no hyperinflation. Pulmonary vasculature is normal. No consolidation, pleural effusion, or pneumothorax. No acute osseous abnormalities are seen. IMPRESSION: Mild bronchial thickening can be seen with bronchitis or asthma. Electronically Signed   By: Rubye OaksMelanie  Ehinger M.D.   On: 05/19/2016 22:46    Procedures Procedures (including critical care time)  Medications Ordered in ED Medications  albuterol (PROVENTIL) (2.5 MG/3ML) 0.083% nebulizer solution (not administered)  fluticasone (FLONASE) 50 MCG/ACT nasal spray 2 spray (not administered)  albuterol (PROVENTIL) (2.5 MG/3ML) 0.083% nebulizer solution 5 mg (5 mg Nebulization Given 05/19/16 2210)  predniSONE (DELTASONE) tablet 60 mg (60 mg Oral Given 05/19/16 2350)  ipratropium (ATROVENT) nebulizer solution 0.5 mg (0.5 mg Nebulization Given 05/19/16 2351)  albuterol (PROVENTIL) (2.5 MG/3ML) 0.083% nebulizer solution 5 mg (5 mg Nebulization Given 05/19/16 2351)  loratadine (CLARITIN) tablet 10 mg (10 mg Oral Given 05/19/16 2349)  ibuprofen (ADVIL,MOTRIN) tablet 800 mg (800 mg Oral Given 05/19/16 2350)     Initial Impression / Assessment and Plan / ED Course  I have reviewed the triage vital signs and the nursing notes.  Pertinent labs & imaging results that were available during my care of the patient were reviewed by me and considered in my medical  decision making (see chart for details).     Pt CXR negative for acute infiltrate. Patient ambulatory without hypoxia following DuoNeb x 2 and oral prednisone. Patient's symptoms are consistent with URI, likely viral etiology. Discussed that antibiotics are not indicated for viral infections. Pt will be discharged with symptomatic treatment. She verbalizes understanding and is agreeable with plan. Patient is hemodynamically stable and in NAD prior to discharge.   Final Clinical Impressions(s) / ED Diagnoses   Final diagnoses:  Viral upper respiratory tract infection  Mild persistent reactive airway disease with wheezing without complication    New Prescriptions New Prescriptions   IBUPROFEN (ADVIL,MOTRIN) 600 MG TABLET    Take 1 tablet (600 mg total) by mouth every 6 (six) hours as needed.   LORATADINE (CLARITIN) 10 MG TABLET    Take 1 tablet (10 mg total) by mouth daily.   PREDNISONE (DELTASONE) 20 MG TABLET    Take 2 tablets (40 mg total) by mouth daily.     Antony MaduraKelly Philopater Mucha, PA-C 05/20/16 0037    Doug SouSam Jacubowitz, MD 05/20/16  0040  

## 2016-05-19 NOTE — ED Triage Notes (Signed)
Pt presents with LEFT ear ache, productive cough, and asthma for 3-4 days; pt denies fever, n/v/d; late expiratory wheezing in triage- rescue inhaler without improvement

## 2016-05-20 MED ORDER — PREDNISONE 20 MG PO TABS: 40.0000 mg | ORAL_TABLET | Freq: Every day | ORAL | 0 refills | Status: DC

## 2016-05-20 MED ORDER — IBUPROFEN 600 MG PO TABS: 600.0000 mg | ORAL_TABLET | Freq: Four times a day (QID) | ORAL | 0 refills | Status: DC | PRN

## 2016-05-20 MED ORDER — LORATADINE 10 MG PO TABS: 10.0000 mg | ORAL_TABLET | Freq: Every day | ORAL | 0 refills | Status: DC

## 2016-05-20 NOTE — ED Notes (Signed)
Pt ambulated, O2 was 98%, pulse 865bpm.  [facility]

## 2016-05-20 NOTE — Discharge Instructions (Signed)
Use Claritin for congestion as well as Flonase. You may use 2 puffs of Flonase in each nostril daily as needed. Take prednisone as prescribed until finished. You may take ibuprofen as needed for pain control. Continue with 2 puffs of an albuterol inhaler every 4-6 hours as needed for wheezing and shortness of breath.

## 2016-06-15 ENCOUNTER — Encounter (HOSPITAL_COMMUNITY): Payer: Self-pay | Admitting: Emergency Medicine

## 2016-06-15 ENCOUNTER — Ambulatory Visit (INDEPENDENT_AMBULATORY_CARE_PROVIDER_SITE_OTHER): Payer: Medicaid Other

## 2016-06-15 ENCOUNTER — Ambulatory Visit (HOSPITAL_COMMUNITY)
Admission: EM | Admit: 2016-06-15 | Discharge: 2016-06-15 | Disposition: A | Discharge status: Home / Self Care | Payer: Medicaid Other | Attending: Internal Medicine | Admitting: Internal Medicine

## 2016-06-15 DIAGNOSIS — J4541 Moderate persistent asthma with (acute) exacerbation: Secondary | ICD-10-CM

## 2016-06-15 MED ORDER — DEXAMETHASONE SODIUM PHOSPHATE 10 MG/ML IJ SOLN
10.0000 mg | Freq: Once | INTRAMUSCULAR | Status: AC
  Administered 2016-06-15: 10 mg via INTRAMUSCULAR

## 2016-06-15 MED ORDER — IPRATROPIUM-ALBUTEROL 0.5-2.5 (3) MG/3ML IN SOLN
RESPIRATORY_TRACT | Status: AC
  Filled 2016-06-15: qty 3

## 2016-06-15 MED ORDER — IPRATROPIUM-ALBUTEROL 0.5-2.5 (3) MG/3ML IN SOLN
3.0000 mL | Freq: Once | RESPIRATORY_TRACT | Status: AC
  Administered 2016-06-15: 3 mL via RESPIRATORY_TRACT

## 2016-06-15 MED ORDER — DEXAMETHASONE SODIUM PHOSPHATE 10 MG/ML IJ SOLN
INTRAMUSCULAR | Status: AC
  Filled 2016-06-15: qty 1

## 2016-06-15 MED ORDER — SODIUM CHLORIDE 0.9 % IN NEBU
INHALATION_SOLUTION | RESPIRATORY_TRACT | Status: AC
  Filled 2016-06-15: qty 3

## 2016-06-15 MED ORDER — ALBUTEROL SULFATE HFA 108 (90 BASE) MCG/ACT IN AERS: 1.0000 | INHALATION_SPRAY | Freq: Four times a day (QID) | RESPIRATORY_TRACT | 0 refills | Status: DC | PRN

## 2016-06-15 MED ORDER — PREDNISONE 50 MG PO TABS: ORAL_TABLET | ORAL | 0 refills | Status: DC

## 2016-06-15 MED ORDER — AZITHROMYCIN 250 MG PO TABS: 250.0000 mg | ORAL_TABLET | Freq: Every day | ORAL | 0 refills | Status: DC

## 2016-06-15 NOTE — ED Notes (Signed)
Patient receiving breathing treatment prior to CXR 

## 2016-06-15 NOTE — ED Provider Notes (Signed)
CSN: [id number]     Arrival date & time 06/15/16  1750 History   None    Chief Complaint  Patient presents with  . Asthma   (Consider location/radiation/quality/duration/timing/severity/associated sxs/prior Treatment) 41 year old female presents to clinic for a one week history of asthma exacerbation. She states she's been having difficulty breathing, and has had increased wheezing, along with cough. She states she is on a daily controller medicine, Advair, however she is not currently taking albuterol. Stating she has run out, and her prior physician has not renewed prescription. 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 She describes her cough is dry, hacking, nonproductive, with yellow sputum. She denies a recent history of URI, but has stay she's got sinus congestion. She states she has not had to been hospitalized for asthma and his never been intubated.   The history is provided by the patient.  Asthma     Past Medical History:  Diagnosis Date  . Asthma   . Bronchiectasis with acute exacerbation (HCC)    History reviewed. No pertinent surgical history. History reviewed. No pertinent family history. Social History  Substance Use Topics  . Smoking status: Former Games developermoker  . Smokeless tobacco: Never Used  . Alcohol use No   OB History    No data available     Review of Systems  Reason unable to perform ROS: As covered in history of present illness.  All other systems reviewed and are negative.   Allergies  Patient has no known allergies.  Home Medications   Prior to Admission medications   Medication Sig Start Date End Date Taking? Authorizing Provider  Fluticasone-Salmeterol (ADVAIR) 250-50 MCG/DOSE AEPB Inhale 1 puff into the lungs 2 (two) times daily. Please instruct in usage 04/01/16  Yes Linna HoffJames D Kindl, MD  albuterol (PROVENTIL HFA;VENTOLIN HFA) 108 (90 Base) MCG/ACT inhaler Inhale  1-2 puffs into the lungs every 6 (six) hours as needed for wheezing or shortness of breath. 06/15/16   Dorena BodoLawrence Nima Kemppainen, NP  azithromycin (ZITHROMAX) 250 MG tablet Take 1 tablet (250 mg total) by mouth daily. Take first 2 tablets together, then 1 every day until finished. 06/15/16   Dorena BodoLawrence Finnlee Silvernail, NP  ibuprofen (ADVIL,MOTRIN) 600 MG tablet Take 1 tablet (600 mg total) by mouth every 6 (six) hours as needed. 05/20/16   Antony MaduraKelly Humes, PA-C  loratadine (CLARITIN) 10 MG tablet Take 1 tablet (10 mg total) by mouth daily. 05/20/16   Antony MaduraKelly Humes, PA-C  predniSONE (DELTASONE) 50 MG tablet 1 tablet daily with food 06/15/16   Dorena BodoLawrence Tangie Stay, NP   Meds Ordered and Administered this Visit   Medications  dexamethasone (DECADRON) injection 10 mg (10 mg Intramuscular Given 06/15/16 1820)  ipratropium-albuterol (DUONEB) 0.5-2.5 (3) MG/3ML nebulizer solution 3 mL (3 mLs Nebulization Given 06/15/16 1820)  ipratropium-albuterol (DUONEB) 0.5-2.5 (3) MG/3ML nebulizer solution 3 mL (3 mLs Nebulization Given 06/15/16 1908)    BP 120/82 (BP Location: Left Arm)   Pulse 73   Temp 98.5 F (36.9 C) (Oral)   Resp 24   LMP 06/08/2016   SpO2 96%  No data found.   Physical Exam  Constitutional: She is oriented to person, place, and time. She appears well-developed and well-nourished. No distress.  HENT:  Head: Normocephalic and atraumatic.  Right Ear: External ear normal.  Left Ear: External ear normal.  Neck: Normal range of motion. Neck supple. No JVD present.  Cardiovascular: Normal rate and regular rhythm.  Pulmonary/Chest: Effort normal. No respiratory distress. She has wheezes. She has no rhonchi. She has no rales. She exhibits no tenderness.  Abdominal: Soft. Bowel sounds are normal.  Lymphadenopathy:    She has no cervical adenopathy.  Neurological: She is alert and oriented to person, place, and time.  Skin: Skin is warm and dry. Capillary refill takes less than 2 seconds. She is not diaphoretic.  Psychiatric:  She has a normal mood and affect.  Nursing note and vitals reviewed.   Urgent Care Course     Procedures (including critical care time)  Labs Review Labs Reviewed - No data to display  Imaging Review Dg Chest 2 View  Result Date: 06/15/2016 CLINICAL DATA:  Asthma exacerbation.  Shortness of breath EXAM: CHEST  2 VIEW COMPARISON:  05/19/2016 FINDINGS: Heart and mediastinal contours are within normal limits. No focal opacities or effusions. No acute bony abnormality. IMPRESSION: No active cardiopulmonary disease. Electronically Signed   By: Charlett Nose M.D.   On: 06/15/2016 19:02       MDM   1. Moderate persistent asthma with exacerbation    Treated you tonight for an asthma exacerbation. You have had 2 DuoNeb treatments, and 80 mg of Depo-Medrol here in clinic. There were no acute findings on your chest x-ray, your lung sounds, O2 saturation, and symptoms appear to be significantly improved. I sent a prescription to her pharmacy for prednisone, take one tablet daily for 6 days, azithromycin take 2 tablets today, then one daily until finished, and I have refilled her albuterol inhaler. For your albuterol inhaler, take 1-2 puffs every 4-6 hours as needed for wheezing or shortness of breath. Should your symptoms persist follow up with her primary care provider, to the emergency department, or return to clinic as needed.     Dorena Bodo, NP 06/15/16 [phone]

## 2016-06-15 NOTE — ED Triage Notes (Signed)
Here for asthma flare up onset today associated w/wheezing, prod cough   Using albuterol w/no relief.   Pt on phone/texting while triaging  A&O x4... NAD

## 2016-06-15 NOTE — Discharge Instructions (Signed)
Treated you tonight for an asthma exacerbation. You have had 2 DuoNeb treatments, and 80 mg of Depo-Medrol here in clinic. There were no acute findings on your chest x-ray, your lung sounds, O2 saturation, and symptoms appear to be significantly improved. I sent a prescription to her pharmacy for prednisone, take one tablet daily for 6 days, azithromycin take 2 tablets today, then one daily until finished, and I have refilled her albuterol inhaler. For your albuterol inhaler, take 1-2 puffs every 4-6 hours as needed for wheezing or shortness of breath. Should your symptoms persist follow up with her primary care provider, to the emergency department, or return to clinic as needed.

## 2016-07-07 ENCOUNTER — Ambulatory Visit (HOSPITAL_COMMUNITY)
Admission: EM | Admit: 2016-07-07 | Discharge: 2016-07-07 | Disposition: A | Discharge status: Home / Self Care | Payer: Medicaid Other | Attending: Internal Medicine | Admitting: Internal Medicine

## 2016-07-07 ENCOUNTER — Encounter (HOSPITAL_COMMUNITY): Payer: Self-pay | Admitting: Emergency Medicine

## 2016-07-07 DIAGNOSIS — J4521 Mild intermittent asthma with (acute) exacerbation: Secondary | ICD-10-CM | POA: Diagnosis not present

## 2016-07-07 MED ORDER — PREDNISONE 50 MG PO TABS: 50.0000 mg | ORAL_TABLET | Freq: Every day | ORAL | 0 refills | Status: AC

## 2016-07-07 MED ORDER — IPRATROPIUM-ALBUTEROL 0.5-2.5 (3) MG/3ML IN SOLN
RESPIRATORY_TRACT | Status: AC
  Filled 2016-07-07: qty 3

## 2016-07-07 MED ORDER — IPRATROPIUM-ALBUTEROL 0.5-2.5 (3) MG/3ML IN SOLN
3.0000 mL | Freq: Once | RESPIRATORY_TRACT | Status: AC
Start: 2016-07-07 — End: 2016-07-07
  Administered 2016-07-07: 3 mL via RESPIRATORY_TRACT

## 2016-07-07 NOTE — Discharge Instructions (Addendum)
Prescription for prednisone sent to the [facility] on [address].  Please followup with your primary care provider in the next couple weeks to talk about modifying your asthma treatment regimen to control frequent asthma flare-ups more effectively.

## 2016-07-07 NOTE — ED Provider Notes (Signed)
[facility]    CSN: [id number] Arrival date & time: 07/07/16  1805     History   Chief Complaint Chief Complaint  Patient presents with  . Asthma    HPI Wendy Morgan is a 41 y.o. female. She presents today with recent history of frequent asthma exacerbations, about one a month for the last 6 months. Most recently seen March 1 in this facility. 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 She presents today with 2 day history of increasing chest tightness, wheezing, cough. Not much runny/congested nose, no fever. Uses an albuterol inhaler as needed.    HPI  Past Medical History:  Diagnosis Date  . Asthma   . Bronchiectasis with acute exacerbation (HCC)     History reviewed. No pertinent surgical history.     Home Medications    Prior to Admission medications   Medication Sig Start Date End Date Taking? Authorizing Provider  albuterol (PROVENTIL HFA;VENTOLIN HFA) 108 (90 Base) MCG/ACT inhaler Inhale 1-2 puffs into the lungs every 6 (six) hours as needed for wheezing or shortness of breath. 06/15/16   Dorena BodoLawrence Kennard, NP  Fluticasone-Salmeterol (ADVAIR) 250-50 MCG/DOSE AEPB Inhale 1 puff into the lungs 2 (two) times daily. Please instruct in usage 04/01/16   Linna HoffJames D Kindl, MD  ibuprofen (ADVIL,MOTRIN) 600 MG tablet Take 1 tablet (600 mg total) by mouth every 6 (six) hours as needed. 05/20/16   Antony MaduraKelly Humes, PA-C  loratadine (CLARITIN) 10 MG tablet Take 1 tablet (10 mg total) by mouth daily. 05/20/16   Antony MaduraKelly Humes, PA-C  predniSONE (DELTASONE) 50 MG tablet Take 1 tablet (50 mg total) by mouth daily. 07/07/16 07/12/16  Eustace MooreLaura W Murray, MD    Family History No family history on file.  Social History Social History  Substance Use Topics  . Smoking status: Former Games developermoker  . Smokeless tobacco: Never Used  . Alcohol use No     Allergies   Patient has no known allergies.   Review of Systems Review of Systems  All other  systems reviewed and are negative.    Physical Exam Triage Vital Signs ED Triage Vitals  Enc Vitals Group     BP 07/07/16 1816 120/82     Pulse Rate 07/07/16 1816 78     Resp 07/07/16 1816 16     Temp 07/07/16 1816 98.9 F (37.2 C)     Temp Source 07/07/16 1816 Oral     SpO2 07/07/16 1816 93 %     Weight 07/07/16 1815 190 lb (86.2 kg)     Height 07/07/16 1815 5\' 4"  (1.626 m)     Pain Score 07/07/16 1816 0     Pain Loc --    Updated Vital Signs BP 120/82   Pulse 78   Temp 98.9 F (37.2 C) (Oral)   Resp 16   Ht 5\' 4"  (1.626 m)   Wt 190 lb (86.2 kg)   LMP 06/08/2016   SpO2 93%   BMI 32.61 kg/m   Physical Exam  Constitutional: She is oriented to person, place, and time.  Alert, nicely groomed Sitting up on the end of the exam table, splinting mildly  HENT:  Head: Atraumatic.  Bilateral TMs are unremarkable, no erythema, not dull Minimal nasal congestion Throat is pink  Eyes:  Conjugate gaze, no eye redness/drainage  Neck: Neck supple.  Cardiovascular: Normal rate and regular rhythm.   Pulmonary/Chest:  Diminished but symmetric breath sounds throughout with marked wheezing  posteriorly  Abdominal: She exhibits no distension.  Musculoskeletal: Normal range of motion.  No leg swelling  Neurological: She is alert and oriented to person, place, and time.  Skin: Skin is warm and dry.  No cyanosis  Nursing note and vitals reviewed.    UC Treatments / Results   Procedures Procedures (including critical care time)  Medications Ordered in UC Medications  ipratropium-albuterol (DUONEB) 0.5-2.5 (3) MG/3ML nebulizer solution 3 mL (3 mLs Nebulization Given 07/07/16 1936)   Improvement in respiratory effort after neb   Final Clinical Impressions(s) / UC Diagnoses   Final diagnoses:  Mild intermittent asthma with acute exacerbation   Prescription for prednisone sent to the [facility] on [address].  Please followup with your primary care provider in the next  couple weeks to talk about modifying your asthma treatment regimen to control frequent asthma flare-ups more effectively.    Meds ordered this encounter  Medications  . predniSONE (DELTASONE) 50 MG tablet    Sig: Take 1 tablet (50 mg total) by mouth daily.    Dispense:  5 tablet    Refill:  0      Eustace Moore, MD 07/09/16 1216

## 2016-07-07 NOTE — ED Triage Notes (Signed)
PT reports asthma exacerbation since yesterday. PT reports she has used her inhaler with only short term relief.

## 2016-07-30 ENCOUNTER — Emergency Department (HOSPITAL_COMMUNITY): Payer: Medicaid Other

## 2016-07-30 ENCOUNTER — Encounter (HOSPITAL_COMMUNITY): Payer: Self-pay | Admitting: Emergency Medicine

## 2016-07-30 ENCOUNTER — Emergency Department (HOSPITAL_COMMUNITY)
Admission: EM | Admit: 2016-07-30 | Discharge: 2016-07-30 | Disposition: A | Discharge status: Home / Self Care | Payer: Medicaid Other | Attending: Emergency Medicine | Admitting: Emergency Medicine

## 2016-07-30 DIAGNOSIS — J4521 Mild intermittent asthma with (acute) exacerbation: Secondary | ICD-10-CM | POA: Diagnosis not present

## 2016-07-30 DIAGNOSIS — R062 Wheezing: Secondary | ICD-10-CM | POA: Diagnosis present

## 2016-07-30 DIAGNOSIS — Z87891 Personal history of nicotine dependence: Secondary | ICD-10-CM | POA: Insufficient documentation

## 2016-07-30 DIAGNOSIS — Z79899 Other long term (current) drug therapy: Secondary | ICD-10-CM | POA: Insufficient documentation

## 2016-07-30 MED ORDER — ALBUTEROL SULFATE (2.5 MG/3ML) 0.083% IN NEBU
5.0000 mg | INHALATION_SOLUTION | Freq: Once | RESPIRATORY_TRACT | Status: AC
  Administered 2016-07-30: 5 mg via RESPIRATORY_TRACT

## 2016-07-30 MED ORDER — ALBUTEROL SULFATE (2.5 MG/3ML) 0.083% IN NEBU
INHALATION_SOLUTION | RESPIRATORY_TRACT | Status: AC
  Filled 2016-07-30: qty 6

## 2016-07-30 MED ORDER — PREDNISONE 10 MG PO TABS: 20.0000 mg | ORAL_TABLET | Freq: Every day | ORAL | 0 refills | Status: DC

## 2016-07-30 MED ORDER — ALBUTEROL SULFATE HFA 108 (90 BASE) MCG/ACT IN AERS
2.0000 | INHALATION_SPRAY | RESPIRATORY_TRACT | Status: DC | PRN
  Administered 2016-07-30: 2 via RESPIRATORY_TRACT
  Filled 2016-07-30: qty 6.7

## 2016-07-30 MED ORDER — PREDNISONE 20 MG PO TABS
60.0000 mg | ORAL_TABLET | Freq: Once | ORAL | Status: AC
  Administered 2016-07-30: 60 mg via ORAL
  Filled 2016-07-30: qty 3

## 2016-07-30 MED ORDER — IPRATROPIUM-ALBUTEROL 0.5-2.5 (3) MG/3ML IN SOLN
3.0000 mL | Freq: Once | RESPIRATORY_TRACT | Status: AC
  Administered 2016-07-30: 3 mL via RESPIRATORY_TRACT
  Filled 2016-07-30: qty 3

## 2016-07-30 NOTE — ED Triage Notes (Signed)
Pt to ED c/o asthma flare up x 2 days, states she ran out of both of her inhalers because she couldn't see her doctor d/t school. Pt has bilateral expiratory wheezes but resp currently e/u.

## 2016-07-30 NOTE — ED Provider Notes (Signed)
[facility] Provider Note   CSN: [id number] Arrival date & time: 07/30/16  2113     History   Chief Complaint Chief Complaint  Patient presents with  . Asthma    HPI Wendy Morgan is a 41 y.o. female.  This is a 41 year old female with a history of asthma.  His been out of her inhaler since yesterday.  He states yesterday she started having increased wheezing and was using her inhaler frequently.  Never been hospitalized for her asthma but has been on steroids previously, last time being approximately one month ago.  Patient states he had a normal menstrual cycle on March 25.  She is sexually active.  She's had a tubal ligation.       Past Medical History:  Diagnosis Date  . Asthma   . Bronchiectasis with acute exacerbation (HCC)     There are no active problems to display for this patient.   History reviewed. No pertinent surgical history.  OB History    No data available       Home Medications    Prior to Admission medications   Medication Sig Start Date End Date Taking? Authorizing Provider  albuterol (PROVENTIL HFA;VENTOLIN HFA) 108 (90 Base) MCG/ACT inhaler Inhale 1-2 puffs into the lungs every 6 (six) hours as needed for wheezing or shortness of breath. 06/15/16  Yes Dorena Bodo, NP  Fluticasone-Salmeterol (ADVAIR) 250-50 MCG/DOSE AEPB Inhale 1 puff into the lungs 2 (two) times daily. Please instruct in usage 04/01/16  Yes Linna Hoff, MD  ibuprofen (ADVIL,MOTRIN) 600 MG tablet Take 1 tablet (600 mg total) by mouth every 6 (six) hours as needed. Patient taking differently: Take 600 mg by mouth every 6 (six) hours as needed for moderate pain.  05/20/16  Yes Antony Madura, PA-C  predniSONE (DELTASONE) 10 MG tablet Take 2 tablets (20 mg total) by mouth daily. 07/30/16   Earley Favor, NP    Family History No family history on file.  Social History Social History  Substance Use Topics  . Smoking status: Former Smoker    Quit date: 1998  .  Smokeless tobacco: Never Used  . Alcohol use No     Allergies   Patient has no known allergies.   Review of Systems Review of Systems  Constitutional: Negative for fever.  Respiratory: Positive for cough and wheezing.   All other systems reviewed and are negative.    Physical Exam Updated Vital Signs BP (!) 133/92 (BP Location: Right Arm)   Pulse 74   Temp 97.6 F (36.4 C)   Resp 18   Ht  (1.626 m)   Wt 94.3 kg   LMP 07/09/2016 (Approximate)   SpO2 97%   BMI 35.70 kg/m   Physical Exam  Constitutional: She appears well-developed and well-nourished.  HENT:  Head: Normocephalic.  Eyes: Pupils are equal, round, and reactive to light.  Neck: Normal range of motion.  Cardiovascular: Normal rate.   Pulmonary/Chest: Effort normal. She has wheezes.  Patient has high-pitched end expiratory wheeze in all lung fields  Abdominal: Soft.  Musculoskeletal: Normal range of motion.  Neurological: She is alert.  Skin: Skin is warm and dry.  Psychiatric: She has a normal mood and affect.  Nursing note and vitals reviewed.    ED Treatments / Results  Labs (all labs ordered are listed, but only abnormal results are displayed) Labs Reviewed - No data to display  EKG  EKG Interpretation None       Radiology  Dg Chest 2 View  Result Date: 07/30/2016 CLINICAL DATA:  Asthma and shortness of breath EXAM: CHEST  2 VIEW COMPARISON:  Chest radiograph 06/15/2016 FINDINGS: The heart size and mediastinal contours are within normal limits. Both lungs are clear. The visualized skeletal structures are unremarkable. IMPRESSION: No active cardiopulmonary disease. Electronically Signed   By: Deatra Robinson M.D.   On: 07/30/2016 22:29    Procedures Procedures (including critical care time)  Medications Ordered in ED Medications  albuterol (PROVENTIL HFA;VENTOLIN HFA) 108 (90 Base) MCG/ACT inhaler 2 puff (not administered)  albuterol (PROVENTIL) (2.5 MG/3ML) 0.083% nebulizer  solution 5 mg (5 mg Nebulization Given 07/30/16 2124)  ipratropium-albuterol (DUONEB) 0.5-2.5 (3) MG/3ML nebulizer solution 3 mL (3 mLs Nebulization Given 07/30/16 2209)  predniSONE (DELTASONE) tablet 60 mg (60 mg Oral Given 07/30/16 2209)     Initial Impression / Assessment and Plan / ED Course  I have reviewed the triage vital signs and the nursing notes.  Pertinent labs & imaging results that were available during my care of the patient were reviewed by me and considered in my medical decision making (see chart for details).    Patient will be started on steroid taper.  She'll be provided with an inhaler.  She will get a second DuoNeb in the emergency department.  Patient has been examined.  Chest x-ray is normal.  She's no longer having any end expiratory wheeze.  She will be spread with an inhaler and given a prescription for prednisone and has been encouraged to follow-up with her primary care physician Final Clinical Impressions(s) / ED Diagnoses   Final diagnoses:  Mild intermittent asthma with exacerbation    New Prescriptions New Prescriptions   PREDNISONE (DELTASONE) 10 MG TABLET    Take 2 tablets (20 mg total) by mouth daily.     Earley Favor, NP 07/30/16 2259    Earley Favor, NP 07/30/16 2300    Rolland Porter, MD 08/05/16 [phone]

## 2016-07-30 NOTE — Discharge Instructions (Signed)
Her chest x-ray is normal.  You have  been given an inhaler to use.  Please uses as follows 2 puffs every 4-6 hours while awake for 2 days and then as needed.  You've also been given a prescription for prednisone.  Please take this until all tablets have been completed.  Make an appointment with your primary care physician for follow-up

## 2016-08-06 ENCOUNTER — Encounter (HOSPITAL_COMMUNITY): Payer: Self-pay

## 2016-08-06 ENCOUNTER — Emergency Department (HOSPITAL_COMMUNITY)
Admission: EM | Admit: 2016-08-06 | Discharge: 2016-08-06 | Disposition: A | Discharge status: Home / Self Care | Payer: Medicaid Other | Attending: Emergency Medicine | Admitting: Emergency Medicine

## 2016-08-06 ENCOUNTER — Emergency Department (HOSPITAL_COMMUNITY): Payer: Medicaid Other

## 2016-08-06 DIAGNOSIS — J45909 Unspecified asthma, uncomplicated: Secondary | ICD-10-CM | POA: Diagnosis present

## 2016-08-06 DIAGNOSIS — Z87891 Personal history of nicotine dependence: Secondary | ICD-10-CM | POA: Diagnosis not present

## 2016-08-06 DIAGNOSIS — J45901 Unspecified asthma with (acute) exacerbation: Secondary | ICD-10-CM

## 2016-08-06 DIAGNOSIS — Z79899 Other long term (current) drug therapy: Secondary | ICD-10-CM | POA: Insufficient documentation

## 2016-08-06 MED ORDER — PREDNISONE 20 MG PO TABS: 40.0000 mg | ORAL_TABLET | Freq: Every day | ORAL | 0 refills | Status: DC

## 2016-08-06 MED ORDER — METHYLPREDNISOLONE SODIUM SUCC 125 MG IJ SOLR
125.0000 mg | Freq: Once | INTRAMUSCULAR | Status: AC
  Administered 2016-08-06: 125 mg via INTRAVENOUS
  Filled 2016-08-06: qty 2

## 2016-08-06 MED ORDER — ALBUTEROL (5 MG/ML) CONTINUOUS INHALATION SOLN
10.0000 mg/h | INHALATION_SOLUTION | RESPIRATORY_TRACT | Status: DC
  Administered 2016-08-06: 10 mg/h via RESPIRATORY_TRACT
  Filled 2016-08-06: qty 20

## 2016-08-06 MED ORDER — SODIUM CHLORIDE 0.9 % IV BOLUS (SEPSIS)
1000.0000 mL | Freq: Once | INTRAVENOUS | Status: AC
  Administered 2016-08-06: 1000 mL via INTRAVENOUS

## 2016-08-06 MED ORDER — ALBUTEROL SULFATE (2.5 MG/3ML) 0.083% IN NEBU
5.0000 mg | INHALATION_SOLUTION | Freq: Once | RESPIRATORY_TRACT | Status: AC
  Administered 2016-08-06: 5 mg via RESPIRATORY_TRACT
  Filled 2016-08-06: qty 6

## 2016-08-06 NOTE — Discharge Instructions (Signed)
As discussed, it is important that you monitor your condition carefully, taking all medication as directed, and be sure to follow-up with both your primary care physician and our pulmonologists.  Return here for concerning changes in your condition.

## 2016-08-06 NOTE — ED Provider Notes (Signed)
[facility] Provider Note   CSN: [id number] Arrival date & time: 08/06/16  0719     History   Chief Complaint Chief Complaint  Patient presents with  . Asthma    HPI Wendy Morgan is a 41 y.o. female.  HPI Patient presents one week after being evaluated for an asthma exacerbation, now with concern for wheezing. She notes that she completed her steroid course, has been taking albuterol as directed, but over the past day has developed substantial recurrence of wheezing, chest discomfort. No report of new fever, chills, nausea, vomiting, other notable changes.  Past Medical History:  Diagnosis Date  . Asthma   . Bronchiectasis with acute exacerbation (HCC)     There are no active problems to display for this patient.   History reviewed. No pertinent surgical history.  OB History    No data available       Home Medications    Prior to Admission medications   Medication Sig Start Date End Date Taking? Authorizing Provider  albuterol (PROVENTIL HFA;VENTOLIN HFA) 108 (90 Base) MCG/ACT inhaler Inhale 1-2 puffs into the lungs every 6 (six) hours as needed for wheezing or shortness of breath. 06/15/16  Yes Dorena Bodo, NP  ibuprofen (ADVIL,MOTRIN) 600 MG tablet Take 1 tablet (600 mg total) by mouth every 6 (six) hours as needed. Patient taking differently: Take 600 mg by mouth every 6 (six) hours as needed for moderate pain.  05/20/16  Yes Antony Madura, PA-C  Fluticasone-Salmeterol (ADVAIR) 250-50 MCG/DOSE AEPB Inhale 1 puff into the lungs 2 (two) times daily. Please instruct in usage Patient not taking: Reported on 08/06/2016 04/01/16   Linna Hoff, MD  predniSONE (DELTASONE) 10 MG tablet Take 2 tablets (20 mg total) by mouth daily. Patient not taking: Reported on 08/06/2016 07/30/16   Earley Favor, NP    Family History History reviewed. No pertinent family history.  Social History Social History  Substance Use Topics  . Smoking status: Former Smoker   Quit date: 1998  . Smokeless tobacco: Never Used  . Alcohol use No     Allergies   Patient has no known allergies.   Review of Systems Review of Systems  Constitutional:       Per HPI, otherwise negative  HENT:       Per HPI, otherwise negative  Respiratory:       Per HPI, otherwise negative  Cardiovascular:       Per HPI, otherwise negative  Gastrointestinal: Negative for vomiting.  Endocrine:       Negative aside from HPI  Genitourinary:       Neg aside from HPI   Musculoskeletal:       Per HPI, otherwise negative  Skin: Negative.   Allergic/Immunologic: Negative for immunocompromised state.  Neurological: Negative for syncope.     Physical Exam Updated Vital Signs Ht  (1.626 m)   Wt 208 lb (94.3 kg)   LMP 07/09/2016 (Approximate)   BMI 35.70 kg/m   Physical Exam  Constitutional: She is oriented to person, place, and time. She appears well-developed and well-nourished. No distress.  HENT:  Head: Normocephalic and atraumatic.  Eyes: Conjunctivae and EOM are normal.  Cardiovascular: Regular rhythm.  Tachycardia present.   Pulmonary/Chest: Effort normal. No stridor. Tachypnea noted. No respiratory distress. She has decreased breath sounds. She has wheezes.  Abdominal: She exhibits no distension.  Musculoskeletal: She exhibits no edema.  Neurological: She is alert and oriented to person, place, and time. No cranial  nerve deficit.  Skin: Skin is warm and dry.  Psychiatric: She has a normal mood and affect.  Nursing note and vitals reviewed.    ED Treatments / Results   Radiology Dg Chest 2 View  Result Date: 08/06/2016 CLINICAL DATA:  Shortness of breath.  History of asthma. EXAM: CHEST  2 VIEW COMPARISON:  Chest radiograph 07/30/2016 FINDINGS: Stable cardiac and mediastinal contours. No consolidative pulmonary opacities. No pleural effusion or pneumothorax. Regional skeleton unremarkable. IMPRESSION: No acute cardiopulmonary process. Electronically  Signed   By: Annia Belt M.D.   On: 08/06/2016 07:50    Procedures Procedures (including critical care time)  Medications Ordered in ED Medications  albuterol (PROVENTIL,VENTOLIN) solution continuous neb (not administered)  sodium chloride 0.9 % bolus 1,000 mL (1,000 mLs Intravenous New Bag/Given 08/06/16 0759)  albuterol (PROVENTIL) (2.5 MG/3ML) 0.083% nebulizer solution 5 mg (5 mg Nebulization Given 08/06/16 0759)  methylPREDNISolone sodium succinate (SOLU-MEDROL) 125 mg/2 mL injection 125 mg (125 mg Intravenous Given 08/06/16 0800)   Chart review notable for evaluation 1 week ago for similar condition. Given the short timeframe with recurrent symptoms, and reported adherence to medication regimen, the patient was x-ray performed in addition to receiving medication therapy.  8:52 AM Patient has received one albuterol session, steroids, has persistent wheezing, continuous neb starting.  10:28 AM Following completion of continuous nebulizer session the patient has substantial improvement. Minimal audible wheezing, states that she feels substantially better. We had a long conversation about her asthma, need to follow-up with both primary care as well as pulmonology for additional evaluation, consideration of additional long-term medication. With her improvement, no evidence for pneumonia, no evidence for bacteremia or sepsis, the patient is appropriate for discharge with close outpatient follow-up.   Initial Impression / Assessment and Plan / ED Course  I have reviewed the triage vital signs and the nursing notes.  Pertinent labs & imaging results that were available during my care of the patient were reviewed by me and considered in my medical decision making (see chart for details).    Final Clinical Impressions(s) / ED Diagnoses  Asthma exacerbation   Gerhard Munch, MD 08/06/16 1029

## 2016-08-06 NOTE — ED Triage Notes (Signed)
Pt. Here for asthma exacerbation. Pt. Noted to have inspiratory and expiratory wheezing. Pt. Denies any pain. EDP at bedside.

## 2016-08-29 ENCOUNTER — Ambulatory Visit (HOSPITAL_COMMUNITY)
Admission: EM | Admit: 2016-08-29 | Discharge: 2016-08-29 | Disposition: A | Discharge status: Other Acute Inpatient Hospital | Payer: Medicaid Other

## 2016-08-29 ENCOUNTER — Encounter (HOSPITAL_COMMUNITY): Payer: Self-pay | Admitting: Nurse Practitioner

## 2016-08-29 ENCOUNTER — Emergency Department (HOSPITAL_COMMUNITY): Payer: Medicaid Other

## 2016-08-29 ENCOUNTER — Emergency Department (HOSPITAL_COMMUNITY)
Admission: EM | Admit: 2016-08-29 | Discharge: 2016-08-29 | Disposition: A | Discharge status: Home / Self Care | Payer: Medicaid Other | Attending: Emergency Medicine | Admitting: Emergency Medicine

## 2016-08-29 DIAGNOSIS — Z87891 Personal history of nicotine dependence: Secondary | ICD-10-CM | POA: Diagnosis not present

## 2016-08-29 DIAGNOSIS — J4531 Mild persistent asthma with (acute) exacerbation: Secondary | ICD-10-CM | POA: Insufficient documentation

## 2016-08-29 DIAGNOSIS — Z79899 Other long term (current) drug therapy: Secondary | ICD-10-CM | POA: Diagnosis not present

## 2016-08-29 DIAGNOSIS — J45909 Unspecified asthma, uncomplicated: Secondary | ICD-10-CM | POA: Diagnosis present

## 2016-08-29 MED ORDER — FLUTICASONE-SALMETEROL 250-50 MCG/DOSE IN AEPB: 1.0000 | INHALATION_SPRAY | Freq: Two times a day (BID) | RESPIRATORY_TRACT | 0 refills | Status: DC

## 2016-08-29 MED ORDER — ALBUTEROL SULFATE (2.5 MG/3ML) 0.083% IN NEBU
5.0000 mg | INHALATION_SOLUTION | Freq: Once | RESPIRATORY_TRACT | Status: AC
  Administered 2016-08-29: 5 mg via RESPIRATORY_TRACT
  Filled 2016-08-29: qty 6

## 2016-08-29 MED ORDER — PREDNISONE 20 MG PO TABS
60.0000 mg | ORAL_TABLET | Freq: Once | ORAL | Status: AC
  Administered 2016-08-29: 60 mg via ORAL
  Filled 2016-08-29: qty 3

## 2016-08-29 MED ORDER — PREDNISONE 10 MG (21) PO TBPK: ORAL_TABLET | Freq: Every day | ORAL | 0 refills | Status: DC

## 2016-08-29 MED ORDER — IPRATROPIUM BROMIDE 0.02 % IN SOLN
0.5000 mg | Freq: Once | RESPIRATORY_TRACT | Status: AC
  Administered 2016-08-29: 0.5 mg via RESPIRATORY_TRACT
  Filled 2016-08-29: qty 2.5

## 2016-08-29 MED ORDER — IPRATROPIUM-ALBUTEROL 0.5-2.5 (3) MG/3ML IN SOLN: 3.0000 mL | Freq: Once | RESPIRATORY_TRACT | Status: DC

## 2016-08-29 MED ORDER — AEROCHAMBER PLUS FLO-VU MEDIUM MISC
1.0000 | Freq: Once | Status: AC
  Administered 2016-08-29: 1
  Filled 2016-08-29: qty 1

## 2016-08-29 MED ORDER — IPRATROPIUM-ALBUTEROL 0.5-2.5 (3) MG/3ML IN SOLN
3.0000 mL | Freq: Once | RESPIRATORY_TRACT | Status: AC
  Administered 2016-08-29: 3 mL via RESPIRATORY_TRACT
  Filled 2016-08-29: qty 3

## 2016-08-29 NOTE — ED Provider Notes (Signed)
[facility] Provider Note   CSN: [id number] Arrival date & time: 08/29/16  1509     History   Chief Complaint Chief Complaint  Patient presents with  . Asthma    HPI Wendy Morgan is a 41 y.o. female presenting with asthma exacerbation and wheezing. She reports that this started last week and she has seen her PCP who gave her 3 day course of steroids. She improved while she was on the steroids but then the wheezing returns as soon as discontinued. She reports improvement after her nebulizing treatment here in ED. She has tried using her inhaler without relief. She reports a dry cough. She denies fever, chills, productive cough, cold symptoms or any other symptoms. She has never been admitted for asthma exacerbation or intubated and her last ED visit for an exacerbation was 08/06/2016.  HPI  Past Medical History:  Diagnosis Date  . Asthma   . Bronchiectasis with acute exacerbation (HCC)     There are no active problems to display for this patient.   History reviewed. No pertinent surgical history.  OB History    No data available       Home Medications    Prior to Admission medications   Medication Sig Start Date End Date Taking? Authorizing Provider  albuterol (PROVENTIL HFA;VENTOLIN HFA) 108 (90 Base) MCG/ACT inhaler Inhale 1-2 puffs into the lungs every 6 (six) hours as needed for wheezing or shortness of breath. 06/15/16   Dorena Bodo, NP  Fluticasone-Salmeterol (ADVAIR) 250-50 MCG/DOSE AEPB Inhale 1 puff into the lungs 2 (two) times daily. Please instruct in usage 08/29/16   Georgiana Shore, PA-C  ibuprofen (ADVIL,MOTRIN) 600 MG tablet Take 1 tablet (600 mg total) by mouth every 6 (six) hours as needed. Patient taking differently: Take 600 mg by mouth every 6 (six) hours as needed for moderate pain.  05/20/16   Antony Madura, PA-C  predniSONE (STERAPRED UNI-PAK 21 TAB) 10 MG (21) TBPK tablet Take by mouth daily. Take 6 tabs by mouth daily  for 2 days,  then 5 tabs for 2 days, then 4 tabs for 2 days, then 3 tabs for 2 days, 2 tabs for 2 days, then 1 tab by mouth daily for 2 days 08/29/16   Gregary Cromer    Family History History reviewed. No pertinent family history.  Social History Social History  Substance Use Topics  . Smoking status: Former Smoker    Quit date: 1998  . Smokeless tobacco: Never Used  . Alcohol use No     Allergies   Patient has no known allergies.   Review of Systems Review of Systems  Constitutional: Negative for chills and fever.  HENT: Negative for congestion, ear pain, sore throat and trouble swallowing.   Eyes: Negative for pain and visual disturbance.  Respiratory: Positive for cough and wheezing. Negative for chest tightness, shortness of breath and stridor.   Cardiovascular: Negative for chest pain, palpitations and leg swelling.  Gastrointestinal: Negative for abdominal pain, nausea and vomiting.  Genitourinary: Negative for dysuria and hematuria.  Musculoskeletal: Negative for arthralgias, back pain, myalgias, neck pain and neck stiffness.  Skin: Negative for color change, pallor and rash.  Neurological: Negative for seizures, syncope, weakness and light-headedness.     Physical Exam Updated Vital Signs BP 119/67   Pulse 77   Temp 98 F (36.7 C) (Oral)   Resp 15   SpO2 100%   Physical Exam  Constitutional: She appears well-developed and well-nourished. No distress.  Patient is afebrile, nontoxic-appearing, lying comfortably in bed in no acute distress. Speaking in full sentences.  HENT:  Head: Normocephalic and atraumatic.  Eyes: Conjunctivae and EOM are normal. Right eye exhibits no discharge. Left eye exhibits no discharge.  Neck: Normal range of motion.  Cardiovascular: Normal rate, regular rhythm and normal heart sounds.   No murmur heard. Pulmonary/Chest: Effort normal. No respiratory distress. She has wheezes. She has no rales.  Wheezing diffusely in all lung fields   Abdominal: She exhibits no distension.  Musculoskeletal: She exhibits no edema.  Neurological: She is alert.  Skin: Skin is warm and dry. No rash noted. She is not diaphoretic.  Psychiatric: She has a normal mood and affect.  Nursing note and vitals reviewed.    ED Treatments / Results  Labs (all labs ordered are listed, but only abnormal results are displayed) Labs Reviewed - No data to display  EKG  EKG Interpretation None       Radiology Dg Chest 2 View  Result Date: 08/29/2016 CLINICAL DATA:  Wheezing and cough for 1 week.  Asthma. EXAM: CHEST  2 VIEW COMPARISON:  08/06/2016 FINDINGS: The heart size and mediastinal contours are within normal limits. Both lungs are clear. The visualized skeletal structures are unremarkable. IMPRESSION: No active cardiopulmonary disease. Electronically Signed   By: Myles Rosenthal M.D.   On: 08/29/2016 17:07    Procedures Procedures (including critical care time)  Medications Ordered in ED Medications  ipratropium-albuterol (DUONEB) 0.5-2.5 (3) MG/3ML nebulizer solution 3 mL (3 mLs Nebulization Given 08/29/16 1539)  albuterol (PROVENTIL) (2.5 MG/3ML) 0.083% nebulizer solution 5 mg (5 mg Nebulization Given 08/29/16 1913)  ipratropium (ATROVENT) nebulizer solution 0.5 mg (0.5 mg Nebulization Given 08/29/16 1913)  predniSONE (DELTASONE) tablet 60 mg (60 mg Oral Given 08/29/16 1912)  albuterol (PROVENTIL) (2.5 MG/3ML) 0.083% nebulizer solution 5 mg (5 mg Nebulization Given 08/29/16 2054)  ipratropium (ATROVENT) nebulizer solution 0.5 mg (0.5 mg Nebulization Given 08/29/16 2054)  AEROCHAMBER PLUS FLO-VU MEDIUM MISC 1 each (1 each Other Given 08/29/16 2054)     Initial Impression / Assessment and Plan / ED Course  I have reviewed the triage vital signs and the nursing notes.  Pertinent labs & imaging results that were available during my care of the patient were reviewed by me and considered in my medical decision making (see chart for details).      Patient presents with asthma exacerbation. Seen by PCP last week for same. Persisting wheezing and no relief from her inhaler.   She reports improvement after nebulizing treatment here in ED. On my assessment she is still wheezing diffusely in all lung fields. Ordered second nebulizing treatment and prednisone, will reassess   On reassessment after second nebs, patient reports further improvement. Wheezing had improved but she was still wheezing in all fields.   Patient denied seeing a pulmonary specialist in the past and she has not been using a spacer. She used to take advair daily and reports that she was doing well when she was on daily medicine.  Patient needs her asthma management to be reevaluated. She has been having multiple ED visits with exacerbation and is not well managed at home. Advised patient to avoid triggers and provided referral to pulmonary. She was instructed on the use of spacer with MDI and reinitiated daily advair.  She had improved and was stable for discharge home with close follow up.  Discussed strict return precautions and advised to return to the emergency department if experiencing any  new or worsening symptoms. Instructions were understood and patient agreed with discharge plan.  Final Clinical Impressions(s) / ED Diagnoses   Final diagnoses:  Mild persistent asthma with acute exacerbation    New Prescriptions Discharge Medication List as of 08/29/2016  8:49 PM    START taking these medications   Details  predniSONE (STERAPRED UNI-PAK 21 TAB) 10 MG (21) TBPK tablet Take by mouth daily. Take 6 tabs by mouth daily  for 2 days, then 5 tabs for 2 days, then 4 tabs for 2 days, then 3 tabs for 2 days, 2 tabs for 2 days, then 1 tab by mouth daily for 2 days, Starting Tue 08/29/2016, Print         Georgiana ShoreMitchell, Charvez Voorhies B, PA-C 08/29/16 2129    Arby BarrettePfeiffer, Marcy, MD 09/02/16 [phone]

## 2016-08-29 NOTE — ED Triage Notes (Signed)
Pt presents with c/o asthma exacerbation. She reports wheezing, cough. She denies fevers. She saw her PCP earlier this week and was given steroids but she continues to feel more tight and SOB. She has been using her rescue inhaler about every 10 minutes today.

## 2016-08-29 NOTE — Discharge Instructions (Signed)
As discussed, please follow-up with pulmonology and start using a spacer with your inhaler. We will restart Advair daily.  Try to avoid triggers. Return if symptoms worsen, chest pain, shortness of breath or any other new concerning symptoms in the meantime.

## 2016-08-30 ENCOUNTER — Telehealth: Payer: Self-pay | Admitting: *Deleted

## 2016-08-30 NOTE — Telephone Encounter (Signed)
-----   Message from Nyoka CowdenMichael B Wert, MD sent at 08/30/2016  6:01 AM EDT ----- New pt eval asap with all meds in hand re poorly controlled asthma

## 2016-08-30 NOTE — Telephone Encounter (Signed)
LMTCB

## 2016-09-04 NOTE — Telephone Encounter (Signed)
ATC, NA and msg states caller is currently unavailable now [facility]

## 2016-09-05 NOTE — Telephone Encounter (Signed)
LMTCB

## 2016-09-08 NOTE — Telephone Encounter (Signed)
LMTCB

## 2016-09-13 NOTE — Telephone Encounter (Signed)
Closing encounter after several attempts to reach out to this pt

## 2016-09-19 ENCOUNTER — Ambulatory Visit (HOSPITAL_COMMUNITY)
Admission: EM | Admit: 2016-09-19 | Discharge: 2016-09-19 | Disposition: A | Discharge status: Home / Self Care | Payer: Medicaid Other | Attending: Internal Medicine | Admitting: Internal Medicine

## 2016-09-19 ENCOUNTER — Encounter (HOSPITAL_COMMUNITY): Payer: Self-pay | Admitting: Emergency Medicine

## 2016-09-19 DIAGNOSIS — R05 Cough: Secondary | ICD-10-CM | POA: Diagnosis not present

## 2016-09-19 DIAGNOSIS — R062 Wheezing: Secondary | ICD-10-CM | POA: Diagnosis not present

## 2016-09-19 DIAGNOSIS — J4521 Mild intermittent asthma with (acute) exacerbation: Secondary | ICD-10-CM

## 2016-09-19 MED ORDER — PREDNISONE 50 MG PO TABS: 50.0000 mg | ORAL_TABLET | Freq: Every day | ORAL | 0 refills | Status: AC

## 2016-09-19 MED ORDER — METHYLPREDNISOLONE SODIUM SUCC 125 MG IJ SOLR
125.0000 mg | Freq: Once | INTRAMUSCULAR | Status: AC
  Administered 2016-09-19: 125 mg via INTRAMUSCULAR

## 2016-09-19 MED ORDER — METHYLPREDNISOLONE SODIUM SUCC 125 MG IJ SOLR
INTRAMUSCULAR | Status: AC
  Filled 2016-09-19: qty 2

## 2016-09-19 NOTE — ED Provider Notes (Signed)
[facility]    CSN: [id number] Arrival date & time: 09/19/16  1417     History   Chief Complaint Chief Complaint  Patient presents with  . Cough    HPI Wendy Morgan is a 41 y.o. female. She presents today with wheezing and chest tightness, was seen in the emergency room on May 15 and treated with Advair inhaler and prednisone burst. Felt fine until a couple of days ago, with recurrence of symptoms. No fever. No runny/congested nose, no sore throat,  and feels well other than chest tightness, wheezing, cough.    HPI  Past Medical History:  Diagnosis Date  . Asthma   . Bronchiectasis with acute exacerbation (HCC)     History reviewed. No pertinent surgical history.   Home Medications    Prior to Admission medications   Medication Sig Start Date End Date Taking? Authorizing Provider  albuterol (PROVENTIL HFA;VENTOLIN HFA) 108 (90 Base) MCG/ACT inhaler Inhale 1-2 puffs into the lungs every 6 (six) hours as needed for wheezing or shortness of breath. 06/15/16  Yes Dorena BodoKennard, Lawrence, NP  Fluticasone-Salmeterol (ADVAIR) 250-50 MCG/DOSE AEPB Inhale 1 puff into the lungs 2 (two) times daily. Please instruct in usage 08/29/16  Yes Mathews RobinsonsMitchell, Jessica B, PA-C  predniSONE (DELTASONE) 50 MG tablet Take 1 tablet (50 mg total) by mouth daily. 09/19/16 09/25/16  Eustace MooreMurray, Jennise Both W, MD    Family History History reviewed. No pertinent family history.  Social History Social History  Substance Use Topics  . Smoking status: Former Smoker    Quit date: 1998  . Smokeless tobacco: Never Used  . Alcohol use No     Allergies   Patient has no known allergies.   Review of Systems Review of Systems  All other systems reviewed and are negative.    Physical Exam Triage Vital Signs ED Triage Vitals  Enc Vitals Group     BP 09/19/16 1429 127/73     Pulse Rate 09/19/16 1429 66     Resp 09/19/16 1429 20     Temp 09/19/16 1429 97.9 F (36.6 C)     Temp Source 09/19/16 1429  Oral     SpO2 09/19/16 1429 97 %     Weight --      Height --      Pain Score 09/19/16 1428 0     Pain Loc --    Updated Vital Signs BP 127/73 (BP Location: Right Arm)   Pulse 66   Temp 97.9 F (36.6 C) (Oral)   Resp 20   SpO2 97%   Physical Exam  Constitutional: She is oriented to person, place, and time. No distress.  HENT:  Head: Atraumatic.  Eyes:  Conjugate gaze observed, no eye redness/discharge  Neck: Neck supple.  Cardiovascular: Normal rate and regular rhythm.   Pulmonary/Chest: No respiratory distress. She has no rales.  Symmetric breath sounds, markedly diminished posteriorly. Splinting slightly. Occasional soft wheeze. Breath sounds are coarse throughout  Abdominal: She exhibits no distension.  Musculoskeletal: Normal range of motion. She exhibits no edema.  Neurological: She is alert and oriented to person, place, and time.  Skin: Skin is warm and dry.  Nursing note and vitals reviewed.    UC Treatments / Results   Procedures Procedures (including critical care time)  Medications Ordered in UC Medications  methylPREDNISolone sodium succinate (SOLU-MEDROL) 125 mg/2 mL injection 125 mg (not administered)    Final Clinical Impressions(s) / UC Diagnoses   Final diagnoses:  Moderate persistent asthma  with acute exacerbation   Anticipate gradual improvement in chest tightness/wheezing over the next several days.  Prescription for prednisone sent to the pharmacy.  Injection of solumedrol (a steroid) given at the urgent care.  Continue rescue inhaler (albuterol) as needed and daily advair (fluticasone-salmeterol) inhaler twice daily.  Make appointment with primary care provider [facility] contact information in this handout, if you do not have a primary care provider) to discuss longer term treatment options for asthma.    New Prescriptions New Prescriptions   PREDNISONE (DELTASONE) 50 MG TABLET    Take 1 tablet (50 mg total) by mouth daily.       Eustace Moore, MD 09/20/16 [phone]

## 2016-09-19 NOTE — Discharge Instructions (Addendum)
Anticipate gradual improvement in chest tightness/wheezing over the next several days.  Prescription for prednisone sent to the pharmacy.  Injection of solumedrol (a steroid) given at the urgent care.  Continue rescue inhaler (albuterol) as needed and daily advair (fluticasone-salmeterol) inhaler twice daily.  Make appointment with primary care provider [facility] contact information in this handout, if you do not have a primary care provider) to discuss longer term treatment options for asthma.

## 2016-09-19 NOTE — ED Triage Notes (Signed)
[facility] a [facility] of a cough, congestion and wheezing x 2 days.

## 2016-10-11 ENCOUNTER — Emergency Department (HOSPITAL_COMMUNITY): Payer: Medicaid Other

## 2016-10-11 ENCOUNTER — Emergency Department (HOSPITAL_COMMUNITY)
Admission: EM | Admit: 2016-10-11 | Discharge: 2016-10-11 | Disposition: A | Discharge status: Home / Self Care | Payer: Medicaid Other | Attending: Emergency Medicine | Admitting: Emergency Medicine

## 2016-10-11 ENCOUNTER — Encounter (HOSPITAL_COMMUNITY): Payer: Self-pay | Admitting: Emergency Medicine

## 2016-10-11 DIAGNOSIS — R062 Wheezing: Secondary | ICD-10-CM | POA: Diagnosis present

## 2016-10-11 DIAGNOSIS — J4541 Moderate persistent asthma with (acute) exacerbation: Secondary | ICD-10-CM | POA: Diagnosis not present

## 2016-10-11 DIAGNOSIS — J45901 Unspecified asthma with (acute) exacerbation: Secondary | ICD-10-CM

## 2016-10-11 DIAGNOSIS — Z87891 Personal history of nicotine dependence: Secondary | ICD-10-CM | POA: Diagnosis not present

## 2016-10-11 DIAGNOSIS — Z7951 Long term (current) use of inhaled steroids: Secondary | ICD-10-CM | POA: Diagnosis not present

## 2016-10-11 MED ORDER — PREDNISONE 20 MG PO TABS
60.0000 mg | ORAL_TABLET | Freq: Once | ORAL | Status: AC
  Administered 2016-10-11: 60 mg via ORAL
  Filled 2016-10-11: qty 3

## 2016-10-11 MED ORDER — IPRATROPIUM BROMIDE HFA 17 MCG/ACT IN AERS: 2.0000 | INHALATION_SPRAY | Freq: Four times a day (QID) | RESPIRATORY_TRACT | 0 refills | Status: DC | PRN

## 2016-10-11 MED ORDER — ALBUTEROL SULFATE (2.5 MG/3ML) 0.083% IN NEBU
5.0000 mg | INHALATION_SOLUTION | Freq: Once | RESPIRATORY_TRACT | Status: AC
  Administered 2016-10-11: 5 mg via RESPIRATORY_TRACT

## 2016-10-11 MED ORDER — PREDNISONE 20 MG PO TABS: 40.0000 mg | ORAL_TABLET | Freq: Every day | ORAL | 0 refills | Status: DC

## 2016-10-11 MED ORDER — ALBUTEROL SULFATE (2.5 MG/3ML) 0.083% IN NEBU
INHALATION_SOLUTION | RESPIRATORY_TRACT | Status: AC
  Filled 2016-10-11: qty 3

## 2016-10-11 MED ORDER — IPRATROPIUM-ALBUTEROL 0.5-2.5 (3) MG/3ML IN SOLN
3.0000 mL | Freq: Once | RESPIRATORY_TRACT | Status: AC
  Administered 2016-10-11: 3 mL via RESPIRATORY_TRACT
  Filled 2016-10-11: qty 3

## 2016-10-11 NOTE — ED Provider Notes (Signed)
[facility]   CSN: [id number] Arrival date & time: 10/11/16  1330  By signing my name below, I, Doreatha MartinEva Mathews, attest that this documentation has been prepared under the direction and in the presence of  Terance HartKelly Cadyn Fann, PA-C. Electronically Signed: Doreatha MartinEva Mathews, ED Scribe. 10/11/16. 5:50 PM.    History   Chief Complaint Chief Complaint  Patient presents with  . Asthma    HPI Wendy Morgan is a 41 y.o. female with h/o asthma who presents to the Emergency Department complaining of intermittent wheezing that began today. Pt was on steroids for asthma a month ago and states she began to have URI symptoms shortly after (nasal congestion and productive cough with yellow phlegm). Her last dose of steroids was 2w-2943m ago. Pt reports her current symptoms are consistent with prior asthma flare ups. Pt has a home albuterol and Atrovent inhaler, which she reports does not control her asthma well. She also states she is out of the atrovent. She reports relief after breathing tx administered in the waiting room. She denies fever, sore throat.   The history is provided by the patient. No language interpreter was used.    Past Medical History:  Diagnosis Date  . Asthma   . Bronchiectasis with acute exacerbation (HCC)     There are no active problems to display for this patient.   History reviewed. No pertinent surgical history.  OB History    No data available       Home Medications    Prior to Admission medications   Medication Sig Start Date End Date Taking? Authorizing Provider  albuterol (PROVENTIL HFA;VENTOLIN HFA) 108 (90 Base) MCG/ACT inhaler Inhale 1-2 puffs into the lungs every 6 (six) hours as needed for wheezing or shortness of breath. 06/15/16   Dorena BodoKennard, Lawrence, NP  Fluticasone-Salmeterol (ADVAIR) 250-50 MCG/DOSE AEPB Inhale 1 puff into the lungs 2 (two) times daily. Please instruct in usage 08/29/16   Gregary CromerMitchell, Jessica B, PA-C    Family History No family  history on file.  Social History Social History  Substance Use Topics  . Smoking status: Former Smoker    Quit date: 1998  . Smokeless tobacco: Never Used  . Alcohol use No     Allergies   Patient has no known allergies.   Review of Systems Review of Systems  Constitutional: Negative for fever.  HENT: Positive for congestion. Negative for ear pain and sore throat.   Respiratory: Positive for cough, shortness of breath and wheezing.   Cardiovascular: Negative for chest pain.  Gastrointestinal: Negative for abdominal pain, nausea and vomiting.  All other systems reviewed and are negative.    Physical Exam Updated Vital Signs BP 122/66 (BP Location: Right Arm)   Pulse 81   Temp 98.1 F (36.7 C) (Oral)   Resp 16   SpO2 95%   Physical Exam  Constitutional: She is oriented to person, place, and time. She appears well-developed and well-nourished. No distress.  HENT:  Head: Normocephalic and atraumatic.  Nasal congestion. Mucosal edema.   Eyes: Conjunctivae are normal. Pupils are equal, round, and reactive to light. Right eye exhibits no discharge. Left eye exhibits no discharge. No scleral icterus.  Neck: Normal range of motion.  Cardiovascular: Normal rate and regular rhythm.  Exam reveals no gallop and no friction rub.   No murmur heard. Pulmonary/Chest: Effort normal. No respiratory distress. She has wheezes (Diffuse inspiratory and expiratory wheezes). She has no rales. She exhibits no tenderness.  Abdominal: She exhibits  no distension.  Musculoskeletal: Normal range of motion.  Neurological: She is alert and oriented to person, place, and time.  Skin: Skin is warm and dry.  Psychiatric: She has a normal mood and affect. Her behavior is normal.  Nursing note and vitals reviewed.    ED Treatments / Results   DIAGNOSTIC STUDIES: Oxygen Saturation is 95% on RA, adequate by my interpretation.    COORDINATION OF CARE: 5:48 PM Discussed treatment plan with pt at  bedside which includes CXR, breathing tx and pt agreed to plan.    Labs (all labs ordered are listed, but only abnormal results are displayed) Labs Reviewed - No data to display  EKG  EKG Interpretation None       Radiology Dg Chest 2 View  Result Date: 10/11/2016 CLINICAL DATA:  Shortness of breath and wheezing. EXAM: CHEST  2 VIEW COMPARISON:  Aug 29, 2016 FINDINGS: The heart size and mediastinal contours are within normal limits. Both lungs are clear. The visualized skeletal structures are unremarkable. IMPRESSION: No active cardiopulmonary disease. Electronically Signed   By: Gerome Sam III M.D   On: 10/11/2016 14:24    Procedures Procedures (including critical care time)  Medications Ordered in ED Medications  albuterol (PROVENTIL) (2.5 MG/3ML) 0.083% nebulizer solution 5 mg (5 mg Nebulization Given 10/11/16 1355)  ipratropium-albuterol (DUONEB) 0.5-2.5 (3) MG/3ML nebulizer solution 3 mL (3 mLs Nebulization Given 10/11/16 1815)  predniSONE (DELTASONE) tablet 60 mg (60 mg Oral Given 10/11/16 1815)     Initial Impression / Assessment and Plan / ED Course  I have reviewed the triage vital signs and the nursing notes.  Pertinent labs & imaging results that were available during my care of the patient were reviewed by me and considered in my medical decision making (see chart for details).  42 year old with asthma exacerbation. She is seen frequently in the ED for the same. Vitals are normal. CXR negative. On my exam she still has diffuse wheezing. Will order another breathing tx, steroids, and reassess.  On recheck, pt feels improved. She still has mild wheezing but feels comfortable going home. Will rx steroid burst and advised her to talk with her PCP regarding control of her asthma. Return precautions given.  Final Clinical Impressions(s) / ED Diagnoses   Final diagnoses:  Moderate asthma with exacerbation, unspecified whether persistent    New Prescriptions New  Prescriptions   No medications on file    I personally performed the services described in this documentation, which was scribed in my presence. The recorded information has been reviewed and is accurate.    Bethel Born, PA-C 10/11/16 2023    Loren Racer, MD 10/13/16 [phone]

## 2016-10-11 NOTE — Discharge Instructions (Signed)
Please follow up with your doctor regarding control of your asthma Take steroid for the next 4 days Return for worsening symptoms

## 2016-10-11 NOTE — ED Triage Notes (Signed)
Pt reports sudden onset of worsening of asthma today while at work using inhaler Atrovent and albuterol with minimal relief. Pt has wheezing present in all lung fields. breathing easily, no acute distress.

## 2016-10-25 ENCOUNTER — Emergency Department (HOSPITAL_COMMUNITY): Payer: Medicaid Other

## 2016-10-25 ENCOUNTER — Emergency Department (HOSPITAL_COMMUNITY)
Admission: EM | Admit: 2016-10-25 | Discharge: 2016-10-25 | Disposition: A | Discharge status: Home / Self Care | Payer: Medicaid Other | Attending: Emergency Medicine | Admitting: Emergency Medicine

## 2016-10-25 ENCOUNTER — Encounter (HOSPITAL_COMMUNITY): Payer: Self-pay | Admitting: Emergency Medicine

## 2016-10-25 DIAGNOSIS — J45901 Unspecified asthma with (acute) exacerbation: Secondary | ICD-10-CM | POA: Insufficient documentation

## 2016-10-25 DIAGNOSIS — Z87891 Personal history of nicotine dependence: Secondary | ICD-10-CM | POA: Diagnosis not present

## 2016-10-25 DIAGNOSIS — Z79899 Other long term (current) drug therapy: Secondary | ICD-10-CM | POA: Insufficient documentation

## 2016-10-25 DIAGNOSIS — R0602 Shortness of breath: Secondary | ICD-10-CM | POA: Diagnosis present

## 2016-10-25 MED ORDER — IPRATROPIUM-ALBUTEROL 0.5-2.5 (3) MG/3ML IN SOLN
3.0000 mL | Freq: Once | RESPIRATORY_TRACT | Status: AC
  Administered 2016-10-25: 3 mL via RESPIRATORY_TRACT
  Filled 2016-10-25: qty 3

## 2016-10-25 MED ORDER — IPRATROPIUM-ALBUTEROL 0.5-2.5 (3) MG/3ML IN SOLN
3.0000 mL | Freq: Once | RESPIRATORY_TRACT | Status: AC
  Administered 2016-10-25: 3 mL via RESPIRATORY_TRACT

## 2016-10-25 MED ORDER — LORATADINE 10 MG PO TABS: 10.0000 mg | ORAL_TABLET | Freq: Every day | ORAL | 0 refills | Status: DC

## 2016-10-25 MED ORDER — IPRATROPIUM-ALBUTEROL 0.5-2.5 (3) MG/3ML IN SOLN
RESPIRATORY_TRACT | Status: AC
  Filled 2016-10-25: qty 3

## 2016-10-25 NOTE — ED Provider Notes (Signed)
[facility]   CSN: [id number] Arrival date & time: 10/25/16  1332  By signing my name below, I, Cynda AcresHailei Fulton, attest that this documentation has been prepared under the direction and in the presence of Newell RubbermaidJeffrey Rudransh Bellanca, PA-C. Electronically Signed: Cynda AcresHailei Fulton, Scribe. 10/25/16. 4:30 PM.  History   Chief Complaint Chief Complaint  Patient presents with  . Asthma   HPI Comments:  Wendy Morgan is a 41 y.o. female with a history of asthma, who presents to the Emergency Department complaining of persistent, gradually worsening shortness of breath that began one month ago. Patient states she has had recurrent shortness of breath for several weeks now. Patient states she has been placed on steroids every few weeks, which resolves her symptoms for several days, but the shortness of breath then returns. Patient reports associated chest discomfort, fatigue, ear fullness, and chest congestion. No relief with steroids or albuterol inhaler. Patient denies any tobacco use or smoke exposure. Patient denies any chest pain, nausea, vomiting, fever, chills, cough, or any additional symptoms.   The history is provided by the patient. No language interpreter was used.    Past Medical History:  Diagnosis Date  . Asthma   . Bronchiectasis with acute exacerbation (HCC)     There are no active problems to display for this patient.   History reviewed. No pertinent surgical history.  OB History    No data available       Home Medications    Prior to Admission medications   Medication Sig Start Date End Date Taking? Authorizing Provider  albuterol (PROVENTIL HFA;VENTOLIN HFA) 108 (90 Base) MCG/ACT inhaler Inhale 1-2 puffs into the lungs every 6 (six) hours as needed for wheezing or shortness of breath. Patient not taking: Reported on 10/11/2016 06/15/16   Dorena BodoKennard, Lawrence, NP  Albuterol Sulfate (PROAIR RESPICLICK) 108 (90 Base) MCG/ACT AEPB Inhale 2 puffs into the lungs  every 4 (four) hours as needed (for coughing, wheezing, or shortness of breath).     [provider]  Fluticasone-Salmeterol (ADVAIR) 250-50 MCG/DOSE AEPB Inhale 1 puff into the lungs 2 (two) times daily. Please instruct in usage Patient not taking: Reported on 10/11/2016 08/29/16   Mathews RobinsonsMitchell, Jessica B, PA-C  ipratropium (ATROVENT HFA) 17 MCG/ACT inhaler Inhale 2 puffs into the lungs every 6 (six) hours as needed for wheezing. 10/11/16   Bethel BornGekas, Kelly Marie, PA-C  loratadine (CLARITIN) 10 MG tablet Take 1 tablet (10 mg total) by mouth daily. 10/25/16   Cassady Turano, Tinnie GensJeffrey, PA-C  predniSONE (DELTASONE) 20 MG tablet Take 2 tablets (40 mg total) by mouth daily. 10/11/16   Bethel BornGekas, Kelly Marie, PA-C    Family History No family history on file.  Social History Social History  Substance Use Topics  . Smoking status: Former Smoker    Quit date: 1998  . Smokeless tobacco: Never Used  . Alcohol use No     Allergies   Patient has no known allergies.   Review of Systems Review of Systems  Constitutional: Negative for chills and fever.  HENT: Positive for congestion.   Respiratory: Positive for chest tightness and shortness of breath. Negative for cough.   Cardiovascular: Negative for chest pain and leg swelling.  Gastrointestinal: Negative for nausea and vomiting.  All other systems reviewed and are negative.    Physical Exam Updated Vital Signs BP 115/78   Pulse 68   Temp (!) 97.5 F (36.4 C) (Oral)   Resp 12   Ht 5\' 4"  (1.626 m)  Wt 95.7 kg (211 lb)   LMP 09/29/2016   SpO2 99%   BMI 36.22 kg/m   Physical Exam  Constitutional: She is oriented to person, place, and time. She appears well-developed and well-nourished.  HENT:  Head: Normocephalic and atraumatic.  Eyes: EOM are normal. Pupils are equal, round, and reactive to light.  Neck: Normal range of motion. Neck supple.  Cardiovascular: Normal rate and regular rhythm.  Exam reveals no gallop and no friction rub.   No  murmur heard. Pulmonary/Chest: Effort normal. She has wheezes. She has no rales.  Bilateral expiratory wheezes.   Musculoskeletal: Normal range of motion.  Neurological: She is alert and oriented to person, place, and time.  Skin: Skin is warm and dry.  Psychiatric: She has a normal mood and affect.  Nursing note and vitals reviewed.    ED Treatments / Results  DIAGNOSTIC STUDIES: Oxygen Saturation is 100% on RA, normal by my interpretation.    COORDINATION OF CARE: 4:29 PM Discussed treatment plan with pt at bedside and pt agreed to plan, which includes a chest x-ray and two breathing treatments.   Labs (all labs ordered are listed, but only abnormal results are displayed) Labs Reviewed - No data to display  EKG  EKG Interpretation None       Radiology Dg Chest 2 View  Result Date: 10/25/2016 CLINICAL DATA:  Shortness of breath and productive cough. EXAM: CHEST  2 VIEW COMPARISON:  10/11/2016 FINDINGS: The lungs are clear without focal pneumonia, edema, pneumothorax or pleural effusion. The cardiopericardial silhouette is within normal limits for size. The visualized bony structures of the thorax are intact. IMPRESSION: No active cardiopulmonary disease. Electronically Signed   By: Kennith Center M.D.   On: 10/25/2016 16:55    Procedures Procedures (including critical care time)  Medications Ordered in ED Medications  ipratropium-albuterol (DUONEB) 0.5-2.5 (3) MG/3ML nebulizer solution (  Not Given 10/25/16 1400)  ipratropium-albuterol (DUONEB) 0.5-2.5 (3) MG/3ML nebulizer solution 3 mL (3 mLs Nebulization Given 10/25/16 1359)  ipratropium-albuterol (DUONEB) 0.5-2.5 (3) MG/3ML nebulizer solution 3 mL (3 mLs Nebulization Given 10/25/16 1655)     Initial Impression / Assessment and Plan / ED Course  I have reviewed the triage vital signs and the nursing notes.  Pertinent labs & imaging results that were available during my care of the patient were reviewed by me and  considered in my medical decision making (see chart for details).    41 year old female presents today with asthma exacerbation.  Patient received breathing treatment prior to my arrival reports improvement in her symptoms.  Patient still had bilateral wheeze, she received another breathing treatment here which improved her symptoms even further.  She had very mild expiratory wheeze with no acute shortness of breath.  Patient continues to have asthma exacerbations, she does have what appears to be allergic upper respiratory symptoms.  She has no signs of infectious etiology.  She will be started on Claritin.  Patient recently completed a course of steroids, I do not feel it is appropriate to start her on another course of steroids given her symptomatic improvement.  Patient has not been using home medications correctly.  She was counseled on use of Advair and albuterol.  She will follow-up with her primary care for ongoing management, she is given strict return precautions.  She verbalized understanding and agreement to today's plan had no further questions or concerns  Final Clinical Impressions(s) / ED Diagnoses   Final diagnoses:  Mild asthma with exacerbation, unspecified  whether persistent    New Prescriptions New Prescriptions   LORATADINE (CLARITIN) 10 MG TABLET    Take 1 tablet (10 mg total) by mouth daily.  I personally performed the services described in this documentation, which was scribed in my presence. The recorded information has been reviewed and is accurate.    Eyvonne Mechanic, PA-C 10/25/16 1843    Vanetta Mulders, MD 10/27/16 [phone]

## 2016-10-25 NOTE — ED Triage Notes (Signed)
Pt. Stated, I've had asthma for the last month. I have a doctors appt today at [id number], [facility]. But I couldn't wait. I went to some docotr and they gave me some steroids but I still have the asthma.

## 2016-10-25 NOTE — Discharge Instructions (Signed)
Please read attached information. If you experience any new or worsening signs or symptoms please return to the emergency room for evaluation. Please follow-up with your primary care provider or specialist as discussed. Please use medication prescribed only as directed and discontinue taking if you have any concerning signs or symptoms.   °

## 2016-10-26 ENCOUNTER — Encounter (HOSPITAL_COMMUNITY): Payer: Self-pay | Admitting: Emergency Medicine

## 2016-10-26 ENCOUNTER — Ambulatory Visit (HOSPITAL_COMMUNITY)
Admission: EM | Admit: 2016-10-26 | Discharge: 2016-10-26 | Disposition: A | Discharge status: Home / Self Care | Payer: Medicaid Other | Attending: Emergency Medicine | Admitting: Emergency Medicine

## 2016-10-26 DIAGNOSIS — R0602 Shortness of breath: Secondary | ICD-10-CM

## 2016-10-26 DIAGNOSIS — R062 Wheezing: Secondary | ICD-10-CM | POA: Diagnosis not present

## 2016-10-26 DIAGNOSIS — J4541 Moderate persistent asthma with (acute) exacerbation: Secondary | ICD-10-CM

## 2016-10-26 MED ORDER — FLUTICASONE-SALMETEROL 250-50 MCG/DOSE IN AEPB: 1.0000 | INHALATION_SPRAY | Freq: Two times a day (BID) | RESPIRATORY_TRACT | 0 refills | Status: DC

## 2016-10-26 MED ORDER — IPRATROPIUM-ALBUTEROL 0.5-2.5 (3) MG/3ML IN SOLN
RESPIRATORY_TRACT | Status: AC
  Filled 2016-10-26: qty 3

## 2016-10-26 MED ORDER — IPRATROPIUM-ALBUTEROL 0.5-2.5 (3) MG/3ML IN SOLN
3.0000 mL | Freq: Once | RESPIRATORY_TRACT | Status: AC
  Administered 2016-10-26: 3 mL via RESPIRATORY_TRACT

## 2016-10-26 MED ORDER — DEXAMETHASONE SODIUM PHOSPHATE 10 MG/ML IJ SOLN
10.0000 mg | Freq: Once | INTRAMUSCULAR | Status: AC
  Administered 2016-10-26: 10 mg via INTRAMUSCULAR

## 2016-10-26 MED ORDER — ALBUTEROL SULFATE 108 (90 BASE) MCG/ACT IN AEPB: 2.0000 | INHALATION_SPRAY | RESPIRATORY_TRACT | 1 refills | Status: DC | PRN

## 2016-10-26 MED ORDER — DEXAMETHASONE SODIUM PHOSPHATE 10 MG/ML IJ SOLN
INTRAMUSCULAR | Status: AC
  Filled 2016-10-26: qty 1

## 2016-10-26 MED ORDER — PREDNISONE 50 MG PO TABS: ORAL_TABLET | ORAL | 0 refills | Status: DC

## 2016-10-26 NOTE — Discharge Instructions (Signed)
For your asthma exacerbation, I have refilled your albuterol inhaler, I have also refilled your Advair inhaler. I started on prednisone, take one tablet daily with food. Continue over-the-counter loratadine daily. If you have any issues obtaining the Advair, contact our office, and I will switch you to Singulair instead. Follow up with your primary care provider as needed for management of your asthma.

## 2016-10-26 NOTE — ED Provider Notes (Signed)
CSN: [id number]     Arrival date & time 10/26/16  1647 History   First MD Initiated Contact with Patient 10/26/16 1722     Chief Complaint  Patient presents with  . Asthma   (Consider location/radiation/quality/duration/timing/severity/associated sxs/prior Treatment) 41 year old female presents to the clinic with a chief complaint of an asthma exacerbation. She was seen at the [facility] emergency room yesterday for the same, and several times previously in the last month. She was last treated with oral steroids on 10/11/2016, she is been prescribed albuterol inhalers, and Advair, as well as loratadine. She is not taken her Advair, she does not have this inhaler. She does not smoke, other history is unremarkable. Chest x-ray from yesterday was unremarkable.   The history is provided by the patient.  Asthma  Associated symptoms include shortness of breath.    Past Medical History:  Diagnosis Date  . Asthma   . Bronchiectasis with acute exacerbation (HCC)    History reviewed. No pertinent surgical history. No family history on file. Social History  Substance Use Topics  . Smoking status: Former Smoker    Quit date: 1998  . Smokeless tobacco: Never Used  . Alcohol use No   OB History    No data available     Review of Systems  Constitutional: Negative.   HENT: Negative.   Respiratory: Positive for shortness of breath and wheezing.   Cardiovascular: Negative.   Gastrointestinal: Negative.   Musculoskeletal: Negative.   Skin: Negative.   Neurological: Negative.     Allergies  Patient has no known allergies.  Home Medications   Prior to Admission medications   Medication Sig Start Date End Date Taking? Authorizing Provider  Albuterol Sulfate (PROAIR RESPICLICK) 108 (90 Base) MCG/ACT AEPB Inhale 2 puffs into the lungs every 4 (four) hours as needed (for coughing, wheezing, or shortness of breath). 10/26/16   Dorena BodoKennard, Haliegh Khurana, NP  Fluticasone-Salmeterol (ADVAIR) 250-50  MCG/DOSE AEPB Inhale 1 puff into the lungs 2 (two) times daily. Please instruct in usage 10/26/16   Dorena BodoKennard, Rip Hawes, NP  ipratropium (ATROVENT HFA) 17 MCG/ACT inhaler Inhale 2 puffs into the lungs every 6 (six) hours as needed for wheezing. 10/11/16   Bethel BornGekas, Kelly Marie, PA-C  loratadine (CLARITIN) 10 MG tablet Take 1 tablet (10 mg total) by mouth daily. 10/25/16   Hedges, Tinnie GensJeffrey, PA-C  predniSONE (DELTASONE) 50 MG tablet Take 1 tablet daily with food 10/26/16   Dorena BodoKennard, Shaley Leavens, NP   Meds Ordered and Administered this Visit   Medications  ipratropium-albuterol (DUONEB) 0.5-2.5 (3) MG/3ML nebulizer solution 3 mL (3 mLs Nebulization Given 10/26/16 1730)  dexamethasone (DECADRON) injection 10 mg (10 mg Intramuscular Given 10/26/16 1734)  ipratropium-albuterol (DUONEB) 0.5-2.5 (3) MG/3ML nebulizer solution 3 mL (3 mLs Nebulization Given 10/26/16 1805)    BP 109/72 (BP Location: Left Arm)   Pulse 97   Temp 98.4 F (36.9 C) (Oral)   Resp (!) 24   LMP 09/29/2016   SpO2 97%  No data found.   Physical Exam  Constitutional: She is oriented to person, place, and time. She appears well-developed and well-nourished. No distress.  HENT:  Head: Normocephalic and atraumatic.  Right Ear: External ear normal.  Left Ear: External ear normal.  Eyes: Conjunctivae are normal.  Neck: Normal range of motion.  Cardiovascular: Regular rhythm.  Tachycardia present.   Pulmonary/Chest: Effort normal. She has wheezes.  Neurological: She is alert and oriented to person, place, and time.  Skin: Skin is warm and dry. Capillary refill  takes less than 2 seconds. No rash noted. She is not diaphoretic. No erythema.  Psychiatric: She has a normal mood and affect. Her behavior is normal.  Nursing note and vitals reviewed.   Urgent Care Course     Procedures (including critical care time)  Labs Review Labs Reviewed - No data to display  Imaging Review Dg Chest 2 View  Result Date: 10/25/2016 CLINICAL DATA:   Shortness of breath and productive cough. EXAM: CHEST  2 VIEW COMPARISON:  10/11/2016 FINDINGS: The lungs are clear without focal pneumonia, edema, pneumothorax or pleural effusion. The cardiopericardial silhouette is within normal limits for size. The visualized bony structures of the thorax are intact. IMPRESSION: No active cardiopulmonary disease. Electronically Signed   By: Kennith Center M.D.   On: 10/25/2016 16:55        MDM   1. Moderate persistent asthma with exacerbation     Patient given 2 DuoNeb treatments injection dexamethasone in clinic, reports much improvement in her symptoms. Provided refill on her albuterol inhaler, her Advair inhaler, started on a short dose of prednisone, continue over-the-counter loratadine daily. Should symptoms fail to resolve, return to clinic, otherwise follow-up with primary care symptoms worsen consider the emergency room    Dorena Bodo, NP 10/26/16 1836

## 2016-10-26 NOTE — ED Triage Notes (Signed)
Asthma and wheezing intermittently for a month, last few days has worsened, and inhaler is not helping.  Patient has a cough, clear phlegm mostly, rarely yellow phlegm.

## 2016-12-29 ENCOUNTER — Encounter (HOSPITAL_COMMUNITY): Payer: Self-pay | Admitting: Family Medicine

## 2016-12-29 ENCOUNTER — Emergency Department (HOSPITAL_COMMUNITY)
Admission: EM | Admit: 2016-12-29 | Discharge: 2016-12-29 | Disposition: A | Discharge status: Home / Self Care | Payer: Medicaid Other

## 2016-12-29 ENCOUNTER — Ambulatory Visit (HOSPITAL_COMMUNITY)
Admission: EM | Admit: 2016-12-29 | Discharge: 2016-12-29 | Disposition: A | Discharge status: Home / Self Care | Payer: Medicaid Other | Attending: Family Medicine | Admitting: Family Medicine

## 2016-12-29 DIAGNOSIS — R0602 Shortness of breath: Secondary | ICD-10-CM | POA: Diagnosis not present

## 2016-12-29 DIAGNOSIS — R05 Cough: Secondary | ICD-10-CM

## 2016-12-29 DIAGNOSIS — R062 Wheezing: Secondary | ICD-10-CM | POA: Diagnosis not present

## 2016-12-29 DIAGNOSIS — J4541 Moderate persistent asthma with (acute) exacerbation: Secondary | ICD-10-CM

## 2016-12-29 MED ORDER — IPRATROPIUM-ALBUTEROL 0.5-2.5 (3) MG/3ML IN SOLN
3.0000 mL | Freq: Once | RESPIRATORY_TRACT | Status: AC
  Administered 2016-12-29: 3 mL via RESPIRATORY_TRACT

## 2016-12-29 MED ORDER — IPRATROPIUM-ALBUTEROL 0.5-2.5 (3) MG/3ML IN SOLN
RESPIRATORY_TRACT | Status: AC
  Filled 2016-12-29: qty 3

## 2016-12-29 MED ORDER — ALBUTEROL SULFATE (2.5 MG/3ML) 0.083% IN NEBU
INHALATION_SOLUTION | RESPIRATORY_TRACT | Status: AC
  Filled 2016-12-29: qty 3

## 2016-12-29 MED ORDER — AMOXICILLIN 875 MG PO TABS: 875.0000 mg | ORAL_TABLET | Freq: Two times a day (BID) | ORAL | 0 refills | Status: DC

## 2016-12-29 MED ORDER — FLUCONAZOLE 150 MG PO TABS: 150.0000 mg | ORAL_TABLET | Freq: Once | ORAL | 0 refills | Status: AC

## 2016-12-29 MED ORDER — PREDNISONE 20 MG PO TABS: ORAL_TABLET | ORAL | 0 refills | Status: DC

## 2016-12-29 NOTE — ED Notes (Signed)
No answer in lobby for triage  

## 2016-12-29 NOTE — ED Notes (Signed)
Called for triage no answer  

## 2016-12-29 NOTE — Discharge Instructions (Signed)
Return if symptoms persist despite the prednisone and amoxicillin

## 2016-12-29 NOTE — ED Provider Notes (Signed)
[facility]    CSN: [id number] Arrival date & time: 12/29/16  1038     History   Chief Complaint Chief Complaint  Patient presents with  . Cough  . Asthma    HPI Wendy Morgan is a 41 y.o. female.   41 yo woman here for evaluation of cough and asthma.  She has several days of worsening symptoms associated with sinus pressure.  Taking her three inhalers as directed.      Past Medical History:  Diagnosis Date  . Asthma   . Bronchiectasis with acute exacerbation (HCC)     There are no active problems to display for this patient.   History reviewed. No pertinent surgical history.  OB History    No data available       Home Medications    Prior to Admission medications   Medication Sig Start Date End Date Taking? Authorizing Provider  Albuterol Sulfate (PROAIR RESPICLICK) 108 (90 Base) MCG/ACT AEPB Inhale 2 puffs into the lungs every 4 (four) hours as needed (for coughing, wheezing, or shortness of breath). 10/26/16   Dorena Bodo, NP  amoxicillin (AMOXIL) 875 MG tablet Take 1 tablet (875 mg total) by mouth 2 (two) times daily. 12/29/16   Elvina Sidle, MD  fluconazole (DIFLUCAN) 150 MG tablet Take 1 tablet (150 mg total) by mouth once. Repeat if needed 12/29/16 12/29/16  Elvina Sidle, MD  Fluticasone-Salmeterol (ADVAIR) 250-50 MCG/DOSE AEPB Inhale 1 puff into the lungs 2 (two) times daily. Please instruct in usage 10/26/16   Dorena Bodo, NP  ipratropium (ATROVENT HFA) 17 MCG/ACT inhaler Inhale 2 puffs into the lungs every 6 (six) hours as needed for wheezing. 10/11/16   Bethel Born, PA-C  loratadine (CLARITIN) 10 MG tablet Take 1 tablet (10 mg total) by mouth daily. 10/25/16   Hedges, Tinnie Gens, PA-C  predniSONE (DELTASONE) 20 MG tablet Two daily with food 12/29/16   Elvina Sidle, MD    Family History History reviewed. No pertinent family history.  Social History Social History  Substance Use Topics  . Smoking status: Former  Smoker    Quit date: 1998  . Smokeless tobacco: Never Used  . Alcohol use No     Allergies   Patient has no known allergies.   Review of Systems Review of Systems  HENT: Positive for sinus pressure.   Respiratory: Positive for cough, shortness of breath and wheezing.   All other systems reviewed and are negative.    Physical Exam Triage Vital Signs ED Triage Vitals [12/29/16 1132]  Enc Vitals Group     BP 110/76     Pulse Rate 74     Resp 18     Temp 98 F (36.7 C)     Temp src      SpO2 95 %     Weight      Height      Head Circumference      Peak Flow      Pain Score      Pain Loc      Pain Edu?      Excl. in GC?    No data found.   Updated Vital Signs BP 110/76   Pulse 74   Temp 98 F (36.7 C)   Resp 18   SpO2 95%    Physical Exam  Constitutional: She is oriented to person, place, and time. She appears well-developed and well-nourished.  HENT:  Right Ear: External ear normal.  Left Ear: External ear  normal.  Mouth/Throat: Oropharynx is clear and moist.  Eyes: Pupils are equal, round, and reactive to light. Conjunctivae and EOM are normal.  Neck: Normal range of motion. Neck supple.  Cardiovascular: Normal rate, regular rhythm and normal heart sounds.   Pulmonary/Chest: Effort normal. She has wheezes.  Musculoskeletal: Normal range of motion.  Neurological: She is alert and oriented to person, place, and time.  Skin: Skin is warm and dry.  Nursing note and vitals reviewed.    UC Treatments / Results  Labs (all labs ordered are listed, but only abnormal results are displayed) Labs Reviewed - No data to display  EKG  EKG Interpretation None       Radiology No results found.  Procedures Procedures (including critical care time)  Medications Ordered in UC Medications  ipratropium-albuterol (DUONEB) 0.5-2.5 (3) MG/3ML nebulizer solution 3 mL (3 mLs Nebulization Given 12/29/16 1210)     Initial Impression / Assessment and Plan /  UC Course  I have reviewed the triage vital signs and the nursing notes.  Pertinent labs & imaging results that were available during my care of the patient were reviewed by me and considered in my medical decision making (see chart for details).      Final Clinical Impressions(s) / UC Diagnoses   Final diagnoses:  Moderate persistent asthma with exacerbation    New Prescriptions New Prescriptions   AMOXICILLIN (AMOXIL) 875 MG TABLET    Take 1 tablet (875 mg total) by mouth 2 (two) times daily.   FLUCONAZOLE (DIFLUCAN) 150 MG TABLET    Take 1 tablet (150 mg total) by mouth once. Repeat if needed   PREDNISONE (DELTASONE) 20 MG TABLET    Two daily with food     Controlled Substance Prescriptions [place] Controlled Substance Registry consulted? Not Applicable   Elvina Sidle, MD 12/29/16 1219

## 2016-12-29 NOTE — ED Triage Notes (Signed)
Pt here for cough, wheezing. sts using inhaler with minimal relief.

## 2017-01-15 ENCOUNTER — Emergency Department (HOSPITAL_COMMUNITY): Payer: Medicaid Other

## 2017-01-15 ENCOUNTER — Encounter (HOSPITAL_COMMUNITY): Payer: Self-pay | Admitting: *Deleted

## 2017-01-15 ENCOUNTER — Emergency Department (HOSPITAL_COMMUNITY)
Admission: EM | Admit: 2017-01-15 | Discharge: 2017-01-15 | Disposition: A | Discharge status: Home / Self Care | Payer: Medicaid Other | Attending: Emergency Medicine | Admitting: Emergency Medicine

## 2017-01-15 DIAGNOSIS — J4551 Severe persistent asthma with (acute) exacerbation: Secondary | ICD-10-CM | POA: Diagnosis not present

## 2017-01-15 DIAGNOSIS — Z79899 Other long term (current) drug therapy: Secondary | ICD-10-CM | POA: Insufficient documentation

## 2017-01-15 DIAGNOSIS — Z87891 Personal history of nicotine dependence: Secondary | ICD-10-CM | POA: Diagnosis not present

## 2017-01-15 DIAGNOSIS — R05 Cough: Secondary | ICD-10-CM | POA: Diagnosis present

## 2017-01-15 MED ORDER — DEXAMETHASONE 4 MG PO TABS
10.0000 mg | ORAL_TABLET | Freq: Once | ORAL | Status: AC
  Administered 2017-01-15: 10 mg via ORAL
  Filled 2017-01-15: qty 3

## 2017-01-15 MED ORDER — ALBUTEROL SULFATE (2.5 MG/3ML) 0.083% IN NEBU
5.0000 mg | INHALATION_SOLUTION | Freq: Once | RESPIRATORY_TRACT | Status: AC
  Administered 2017-01-15: 5 mg via RESPIRATORY_TRACT

## 2017-01-15 MED ORDER — IPRATROPIUM-ALBUTEROL 0.5-2.5 (3) MG/3ML IN SOLN
3.0000 mL | RESPIRATORY_TRACT | Status: AC
  Administered 2017-01-15 (×3): 3 mL via RESPIRATORY_TRACT
  Filled 2017-01-15: qty 9

## 2017-01-15 MED ORDER — ALBUTEROL SULFATE (2.5 MG/3ML) 0.083% IN NEBU
INHALATION_SOLUTION | RESPIRATORY_TRACT | Status: AC
  Filled 2017-01-15: qty 6

## 2017-01-15 NOTE — ED Provider Notes (Signed)
[facility]   CSN: [id number] Arrival date & time: 01/15/17  1825     History   Chief Complaint Chief Complaint  Patient presents with  . Cough    asthma    HPI Wendy Morgan is a 42 y.o. female.  41 yo F with a chief complaint of shortness of breath. This been an ongoing issue for her. She has been seen multiple times over the past couple months with asthma exacerbations. Has been recurrently on steroids well. She has been using her home breathing treatments without improvement. She denies fevers chills. No sick contacts.   The history is provided by the patient.  Illness  This is a new problem. The current episode started 2 days ago. The problem occurs constantly. The problem has not changed since onset.Associated symptoms include shortness of breath. Pertinent negatives include no chest pain and no headaches. Nothing aggravates the symptoms. Nothing relieves the symptoms. She has tried nothing for the symptoms. The treatment provided no relief.    Past Medical History:  Diagnosis Date  . Asthma   . Bronchiectasis with acute exacerbation (HCC)     There are no active problems to display for this patient.   History reviewed. No pertinent surgical history.  OB History    No data available       Home Medications    Prior to Admission medications   Medication Sig Start Date End Date Taking? Authorizing Provider  Albuterol Sulfate (PROAIR RESPICLICK) 108 (90 Base) MCG/ACT AEPB Inhale 2 puffs into the lungs every 4 (four) hours as needed (for coughing, wheezing, or shortness of breath). 10/26/16   Dorena Bodo, NP  amoxicillin (AMOXIL) 875 MG tablet Take 1 tablet (875 mg total) by mouth 2 (two) times daily. 12/29/16   Elvina Sidle, MD  Fluticasone-Salmeterol (ADVAIR) 250-50 MCG/DOSE AEPB Inhale 1 puff into the lungs 2 (two) times daily. Please instruct in usage 10/26/16   Dorena Bodo, NP  ipratropium (ATROVENT HFA) 17 MCG/ACT inhaler  Inhale 2 puffs into the lungs every 6 (six) hours as needed for wheezing. 10/11/16   Bethel Born, PA-C  loratadine (CLARITIN) 10 MG tablet Take 1 tablet (10 mg total) by mouth daily. 10/25/16   Hedges, Tinnie Gens, PA-C  predniSONE (DELTASONE) 20 MG tablet Two daily with food 12/29/16   Elvina Sidle, MD    Family History No family history on file.  Social History Social History  Substance Use Topics  . Smoking status: Former Smoker    Quit date: 1998  . Smokeless tobacco: Never Used  . Alcohol use No     Allergies   Patient has no known allergies.   Review of Systems Review of Systems  Constitutional: Negative for chills and fever.  HENT: Negative for congestion and rhinorrhea.   Eyes: Negative for redness and visual disturbance.  Respiratory: Positive for cough and shortness of breath. Negative for wheezing.   Cardiovascular: Negative for chest pain and palpitations.  Gastrointestinal: Negative for nausea and vomiting.  Genitourinary: Negative for dysuria and urgency.  Musculoskeletal: Negative for arthralgias and myalgias.  Skin: Negative for pallor and wound.  Neurological: Negative for dizziness and headaches.     Physical Exam Updated Vital Signs BP (!) 145/81 (BP Location: Right Arm)   Pulse 74   Temp 97.9 F (36.6 C) (Oral)   Resp 18   Ht  (1.626 m)   Wt 81.6 kg (180 lb)   LMP 01/09/2017   SpO2 95%   BMI  30.90 kg/m   Physical Exam  Constitutional: She is oriented to person, place, and time. She appears well-developed and well-nourished. No distress.  HENT:  Head: Normocephalic and atraumatic.  Eyes: Pupils are equal, round, and reactive to light. EOM are normal.  Neck: Normal range of motion. Neck supple.  Cardiovascular: Normal rate and regular rhythm.  Exam reveals no gallop and no friction rub.   No murmur heard. Pulmonary/Chest: Effort normal. She has wheezes. She has no rales.  Abdominal: Soft. She exhibits no distension and no mass.  There is no tenderness. There is no guarding.  Musculoskeletal: She exhibits no edema or tenderness.  Neurological: She is alert and oriented to person, place, and time.  Skin: Skin is warm and dry. She is not diaphoretic.  Psychiatric: She has a normal mood and affect. Her behavior is normal.  Nursing note and vitals reviewed.    ED Treatments / Results  Labs (all labs ordered are listed, but only abnormal results are displayed) Labs Reviewed - No data to display  EKG  EKG Interpretation None       Radiology Dg Chest 2 View  Result Date: 01/15/2017 CLINICAL DATA:  Shortness of breath and asthma EXAM: CHEST  2 VIEW COMPARISON:  Chest radiograph 10/25/2016 FINDINGS: The heart size and mediastinal contours are within normal limits. Both lungs are clear. The visualized skeletal structures are unremarkable. IMPRESSION: No active cardiopulmonary disease. Electronically Signed   By: Deatra Robinson M.D.   On: 01/15/2017 19:44    Procedures Procedures (including critical care time)  Medications Ordered in ED Medications  albuterol (PROVENTIL) (2.5 MG/3ML) 0.083% nebulizer solution 5 mg (5 mg Nebulization Given 01/15/17 1856)  ipratropium-albuterol (DUONEB) 0.5-2.5 (3) MG/3ML nebulizer solution 3 mL (3 mLs Nebulization Given 01/15/17 2125)  dexamethasone (DECADRON) tablet 10 mg (10 mg Oral Given 01/15/17 2201)     Initial Impression / Assessment and Plan / ED Course  I have reviewed the triage vital signs and the nursing notes.  Pertinent labs & imaging results that were available during my care of the patient were reviewed by me and considered in my medical decision making (see chart for details).     41 yo F with a chief complaints of shortness of breath. Patient has asthma has wheezing on exam improved with 3 DuoNeb's and Decadron. Since the patient has had 12 visits in the past eight months I'll give her follow-up with pulmonology.  11:02 PM:  I have discussed the  diagnosis/risks/treatment options with the patient and family and believe the pt to be eligible for discharge home to follow-up with PCP. We also discussed returning to the ED immediately if new or worsening sx occur. We discussed the sx which are most concerning (e.g., sudden worsening pain, fever, inability to tolerate by mouth) that necessitate immediate return. Medications administered to the patient during their visit and any new prescriptions provided to the patient are listed below.  Medications given during this visit Medications  albuterol (PROVENTIL) (2.5 MG/3ML) 0.083% nebulizer solution 5 mg (5 mg Nebulization Given 01/15/17 1856)  ipratropium-albuterol (DUONEB) 0.5-2.5 (3) MG/3ML nebulizer solution 3 mL (3 mLs Nebulization Given 01/15/17 2125)  dexamethasone (DECADRON) tablet 10 mg (10 mg Oral Given 01/15/17 2201)     The patient appears reasonably screen and/or stabilized for discharge and I doubt any other medical condition or other [facility] requiring further screening, evaluation, or treatment in the ED at this time prior to discharge.    Final Clinical Impressions(s) / ED Diagnoses  Final diagnoses:  Severe persistent asthma with exacerbation    New Prescriptions Discharge Medication List as of 01/15/2017  9:55 PM       Melene Plan, DO 01/15/17 2302

## 2017-01-15 NOTE — ED Triage Notes (Signed)
Pt here for wheezing and sob.  Recently finished antibiotics and steroids for same.  Appears in no distress.  Some expiratory wheezing noted.

## 2017-01-20 ENCOUNTER — Encounter (HOSPITAL_COMMUNITY): Payer: Self-pay | Admitting: Emergency Medicine

## 2017-01-20 ENCOUNTER — Emergency Department (HOSPITAL_COMMUNITY)
Admission: EM | Admit: 2017-01-20 | Discharge: 2017-01-20 | Disposition: A | Discharge status: Home / Self Care | Payer: Medicaid Other | Attending: Emergency Medicine | Admitting: Emergency Medicine

## 2017-01-20 DIAGNOSIS — J45901 Unspecified asthma with (acute) exacerbation: Secondary | ICD-10-CM

## 2017-01-20 DIAGNOSIS — R0602 Shortness of breath: Secondary | ICD-10-CM | POA: Diagnosis present

## 2017-01-20 DIAGNOSIS — Z87891 Personal history of nicotine dependence: Secondary | ICD-10-CM | POA: Diagnosis not present

## 2017-01-20 DIAGNOSIS — J4541 Moderate persistent asthma with (acute) exacerbation: Secondary | ICD-10-CM | POA: Insufficient documentation

## 2017-01-20 MED ORDER — PREDNISONE 20 MG PO TABS: 40.0000 mg | ORAL_TABLET | Freq: Every day | ORAL | 0 refills | Status: DC

## 2017-01-20 MED ORDER — ALBUTEROL SULFATE (2.5 MG/3ML) 0.083% IN NEBU: 2.5000 mg | INHALATION_SOLUTION | Freq: Four times a day (QID) | RESPIRATORY_TRACT | 0 refills | Status: DC | PRN

## 2017-01-20 MED ORDER — ALBUTEROL SULFATE (2.5 MG/3ML) 0.083% IN NEBU
5.0000 mg | INHALATION_SOLUTION | Freq: Once | RESPIRATORY_TRACT | Status: AC
  Administered 2017-01-20: 5 mg via RESPIRATORY_TRACT

## 2017-01-20 MED ORDER — PREDNISONE 20 MG PO TABS
60.0000 mg | ORAL_TABLET | Freq: Once | ORAL | Status: AC
  Administered 2017-01-20: 60 mg via ORAL
  Filled 2017-01-20: qty 3

## 2017-01-20 MED ORDER — IPRATROPIUM-ALBUTEROL 0.5-2.5 (3) MG/3ML IN SOLN
3.0000 mL | Freq: Once | RESPIRATORY_TRACT | Status: AC
  Administered 2017-01-20: 3 mL via RESPIRATORY_TRACT
  Filled 2017-01-20: qty 3

## 2017-01-20 NOTE — ED Triage Notes (Signed)
Pt states 1 week of asthma exacerbation, seen 10/1. States symptoms have gotten worse. She is out of her nebulizer solution at home. Expiratory wheezing noted

## 2017-01-20 NOTE — ED Provider Notes (Signed)
[facility]   CSN: [id number] Arrival date & time: 01/20/17  4098   History   Chief Complaint Chief Complaint  Patient presents with  . Asthma    HPI Wendy Morgan is a 41 y.o. female who presents with an asthma exacerbation. PMH significant for uncontrolled asthma with frequent exacerbations. She states that she was seen on 10/1 and has several breathing txs and a dose of steroids in the ED. She had a URI at that time which is getting better. She states that her breathing improved after being seen but started worsening again a couple days ago. She has been using her albuterol inhaler at home with no relief. She is out of nebulizer solution. She denies fever. She has never been hospitalized or intubated for her asthma. She has not yet made appt with Pulmonology.  HPI  Past Medical History:  Diagnosis Date  . Asthma   . Bronchiectasis with acute exacerbation (HCC)     There are no active problems to display for this patient.   No past surgical history on file.  OB History    No data available       Home Medications    Prior to Admission medications   Medication Sig Start Date End Date Taking? Authorizing Provider  Albuterol Sulfate (PROAIR RESPICLICK) 108 (90 Base) MCG/ACT AEPB Inhale 2 puffs into the lungs every 4 (four) hours as needed (for coughing, wheezing, or shortness of breath). 10/26/16  Yes Dorena Bodo, NP  Fluticasone-Salmeterol (ADVAIR) 250-50 MCG/DOSE AEPB Inhale 1 puff into the lungs 2 (two) times daily. Please instruct in usage 10/26/16  Yes Dorena Bodo, NP  ipratropium (ATROVENT HFA) 17 MCG/ACT inhaler Inhale 2 puffs into the lungs every 6 (six) hours as needed for wheezing. 10/11/16  Yes Bethel Born, PA-C  amoxicillin (AMOXIL) 875 MG tablet Take 1 tablet (875 mg total) by mouth 2 (two) times daily. Patient not taking: Reported on 01/20/2017 12/29/16   Elvina Sidle, MD  loratadine (CLARITIN) 10 MG tablet Take 1  tablet (10 mg total) by mouth daily. Patient not taking: Reported on 01/20/2017 10/25/16   Hedges, Tinnie Gens, PA-C  predniSONE (DELTASONE) 20 MG tablet Two daily with food Patient not taking: Reported on 01/20/2017 12/29/16   Elvina Sidle, MD    Family History No family history on file.  Social History Social History  Substance Use Topics  . Smoking status: Former Smoker    Quit date: 1998  . Smokeless tobacco: Never Used  . Alcohol use No     Allergies   Patient has no known allergies.   Review of Systems Review of Systems  Constitutional: Negative for chills and fever.  HENT: Negative for congestion, rhinorrhea and sore throat.   Respiratory: Positive for shortness of breath and wheezing.   Cardiovascular: Negative for chest pain.  Gastrointestinal: Negative for abdominal pain, nausea and vomiting.  All other systems reviewed and are negative.    Physical Exam Updated Vital Signs BP 124/84 (BP Location: Right Arm)   Pulse 69   Temp 97.8 F (36.6 C) (Oral)   Resp 20   LMP 01/09/2017   SpO2 95%   Physical Exam  Constitutional: She is oriented to person, place, and time. She appears well-developed and well-nourished. No distress.  HENT:  Head: Normocephalic and atraumatic.  Eyes: Pupils are equal, round, and reactive to light. Conjunctivae are normal. Right eye exhibits no discharge. Left eye exhibits no discharge. No scleral icterus.  Neck: Normal range of  motion.  Cardiovascular: Normal rate and regular rhythm.  Exam reveals no gallop and no friction rub.   No murmur heard. Pulmonary/Chest: Effort normal. No respiratory distress. She has wheezes (Diffuse inspiratory expiratory). She has no rales. She exhibits no tenderness.  Abdominal: She exhibits no distension.  Neurological: She is alert and oriented to person, place, and time.  Skin: Skin is warm and dry.  Psychiatric: She has a normal mood and affect. Her behavior is normal.  Nursing note and vitals  reviewed.    ED Treatments / Results  Labs (all labs ordered are listed, but only abnormal results are displayed) Labs Reviewed - No data to display  EKG  EKG Interpretation None       Radiology No results found.  Procedures Procedures (including critical care time)  Medications Ordered in ED Medications  albuterol (PROVENTIL) (2.5 MG/3ML) 0.083% nebulizer solution 5 mg (5 mg Nebulization Given 01/20/17 0615)  ipratropium-albuterol (DUONEB) 0.5-2.5 (3) MG/3ML nebulizer solution 3 mL (3 mLs Nebulization Given 01/20/17 0708)  predniSONE (DELTASONE) tablet 60 mg (60 mg Oral Given 01/20/17 0708)  ipratropium-albuterol (DUONEB) 0.5-2.5 (3) MG/3ML nebulizer solution 3 mL (3 mLs Nebulization Given 01/20/17 0816)     Initial Impression / Assessment and Plan / ED Course  I have reviewed the triage vital signs and the nursing notes.  Pertinent labs & imaging results that were available during my care of the patient were reviewed by me and considered in my medical decision making (see chart for details).  5:68 AM: 41 year old female with uncontrolled asthma presents with exacerbation, likely due to URI several days ago. She received a tx in triage which she reports has helped. Will add another tx and dose of steroids.   8:12 AM Pt stills has expiratory wheezes at bases. Will add another treatment.  8:56 AM Breathing has subjectively improved however she still has expiratory wheezes. Discussed continued treatments vs rx for steroids and neb solution. She is agreeable to go home. O2 sats have remained above 94% since receiving treatments in the ED. Will d/c with strict return precautions. Also advised make appt with Pulmonology.   Final Clinical Impressions(s) / ED Diagnoses   Final diagnoses:  Moderate asthma with exacerbation, unspecified whether persistent    New Prescriptions New Prescriptions   No medications on file     Beryle Quant 01/20/17 1610    Shaune Pollack, MD 01/21/17 [phone]

## 2017-01-20 NOTE — Discharge Instructions (Signed)
Take Prednisone for the next 5 days Use inhalers/breathing tx as needed Make appointment with Pulmonology Return for worsening symptoms

## 2017-01-31 ENCOUNTER — Ambulatory Visit (HOSPITAL_COMMUNITY)
Admission: EM | Admit: 2017-01-31 | Discharge: 2017-01-31 | Disposition: A | Discharge status: Home / Self Care | Payer: Medicaid Other | Attending: Family Medicine | Admitting: Family Medicine

## 2017-01-31 ENCOUNTER — Encounter (HOSPITAL_COMMUNITY): Payer: Self-pay | Admitting: *Deleted

## 2017-01-31 DIAGNOSIS — J4541 Moderate persistent asthma with (acute) exacerbation: Secondary | ICD-10-CM

## 2017-01-31 DIAGNOSIS — J209 Acute bronchitis, unspecified: Secondary | ICD-10-CM

## 2017-01-31 MED ORDER — PREDNISONE 20 MG PO TABS: 40.0000 mg | ORAL_TABLET | Freq: Every day | ORAL | 0 refills | Status: DC

## 2017-01-31 MED ORDER — AZITHROMYCIN 250 MG PO TABS
250.0000 mg | ORAL_TABLET | Freq: Every day | ORAL | 0 refills | Status: DC
Start: 2017-01-31 — End: 2017-04-26

## 2017-01-31 NOTE — ED Triage Notes (Signed)
Patient reports productive cough x 3 days. Reports shortness of breath. Has history of asthma. Patient reports using breathing treatments at home but has not felt relief.

## 2017-01-31 NOTE — ED Provider Notes (Signed)
[facility]    CSN: [id number] Arrival date & time: 01/31/17  1610     History   Chief Complaint Chief Complaint  Patient presents with  . Cough  . Shortness of Breath    HPI Wendy Morgan is a 41 y.o. female.   41 year old female with history of asthma comes in for 3 day history of productive cough. She has been experiencing shortness of breath and wheezing. Increase use of albuterol inhaler and continues maintenance inhalers with temporary relief. Denies fever, chills, night sweats. Denies sore throat, nasal congestion, rhinorrhea, ear pain, eye pain. She has been seen multiples times this year for asthma exacerbation and was recently treated with prednisone on 01/20/2017 and finished course of prednisone 1 week ago. She states symptoms improved prior to current onset of symptoms. She is in the process of finding a pulmonologist, states she needs a referral and will be seeing PCP soon. She is a former smoker, smoked for 5-6 years with unknown pack year history.       Past Medical History:  Diagnosis Date  . Asthma   . Bronchiectasis with acute exacerbation (HCC)     There are no active problems to display for this patient.   History reviewed. No pertinent surgical history.  OB History    No data available       Home Medications    Prior to Admission medications   Medication Sig Start Date End Date Taking? Authorizing Provider  albuterol (PROVENTIL) (2.5 MG/3ML) 0.083% nebulizer solution Take 3 mLs (2.5 mg total) by nebulization every 6 (six) hours as needed for wheezing or shortness of breath. 01/20/17   Bethel BornGekas, Kelly Marie, PA-C  azithromycin (ZITHROMAX) 250 MG tablet Take 1 tablet (250 mg total) by mouth daily. Take first 2 tablets together, then 1 every day until finished. 01/31/17   Cathie HoopsYu, Amy V, PA-C  Fluticasone-Salmeterol (ADVAIR) 250-50 MCG/DOSE AEPB Inhale 1 puff into the lungs 2 (two) times daily. Please instruct in usage 10/26/16   Dorena BodoKennard,  Lawrence, NP  ipratropium (ATROVENT HFA) 17 MCG/ACT inhaler Inhale 2 puffs into the lungs every 6 (six) hours as needed for wheezing. 10/11/16   Bethel BornGekas, Kelly Marie, PA-C  predniSONE (DELTASONE) 20 MG tablet Take 2 tablets (40 mg total) by mouth daily. 01/31/17   Belinda FisherYu, Amy V, PA-C    Family History History reviewed. No pertinent family history.  Social History Social History  Substance Use Topics  . Smoking status: Former Smoker    Quit date: 1998  . Smokeless tobacco: Never Used  . Alcohol use No     Allergies   Patient has no known allergies.   Review of Systems Review of Systems  Reason unable to perform ROS: See HPI as above.     Physical Exam Triage Vital Signs ED Triage Vitals [01/31/17 1630]  Enc Vitals Group     BP 115/83     Pulse Rate 75     Resp 18     Temp (!) 97.4 F (36.3 C)     Temp Source Oral     SpO2 96 %     Weight      Height      Head Circumference      Peak Flow      Pain Score      Pain Loc      Pain Edu?      Excl. in GC?    No data found.   Updated Vital Signs  BP 115/83 (BP Location: Left Arm)   Pulse 75   Temp (!) 97.4 F (36.3 C) (Oral)   Resp 18   LMP 01/09/2017   SpO2 96%    Physical Exam  Constitutional: She is oriented to person, place, and time. She appears well-developed and well-nourished. No distress.  HENT:  Head: Normocephalic and atraumatic.  Right Ear: Tympanic membrane, external ear and ear canal normal. Tympanic membrane is not erythematous and not bulging.  Left Ear: Tympanic membrane, external ear and ear canal normal. Tympanic membrane is not erythematous and not bulging.  Nose: Nose normal. Right sinus exhibits no maxillary sinus tenderness and no frontal sinus tenderness. Left sinus exhibits no maxillary sinus tenderness and no frontal sinus tenderness.  Mouth/Throat: Uvula is midline, oropharynx is clear and moist and mucous membranes are normal.  Eyes: Pupils are equal, round, and reactive to light.  Conjunctivae are normal.  Neck: Normal range of motion. Neck supple.  Cardiovascular: Normal rate, regular rhythm and normal heart sounds.  Exam reveals no gallop and no friction rub.   No murmur heard. Pulmonary/Chest: Effort normal.  Diffuse wheezing in the periphery, no obvious rhonchi/rales.   Lymphadenopathy:    She has no cervical adenopathy.  Neurological: She is alert and oriented to person, place, and time.  Skin: Skin is warm and dry.  Psychiatric: She has a normal mood and affect. Her behavior is normal. Judgment normal.     UC Treatments / Results  Labs (all labs ordered are listed, but only abnormal results are displayed) Labs Reviewed - No data to display  EKG  EKG Interpretation None       Radiology No results found.  Procedures Procedures (including critical care time)  Medications Ordered in UC Medications - No data to display   Initial Impression / Assessment and Plan / UC Course  I have reviewed the triage vital signs and the nursing notes.  Pertinent labs & imaging results that were available during my care of the patient were reviewed by me and considered in my medical decision making (see chart for details).    Patient with diffuse peripheral wheezing on exam with O2 sat 96% at room air, without current SOB. Patient with multiple CXR this year without pneumonia. Given history of smoking, discussed with patient can treat for bronchitis with azithromycin and prednisone course. Patient is agreeable to plan. Start z-pack and prednisone as directed. Continue maintenance inhaler and albuterol as directed. Patient to follow up with PCP for further management and referrals needed given frequency asthma exacerbations. Return precautions given.   Final Clinical Impressions(s) / UC Diagnoses   Final diagnoses:  Acute bronchitis, unspecified organism  Moderate persistent asthma with exacerbation    New Prescriptions Discharge Medication List as of  01/31/2017  5:00 PM    START taking these medications   Details  azithromycin (ZITHROMAX) 250 MG tablet Take 1 tablet (250 mg total) by mouth daily. Take first 2 tablets together, then 1 every day until finished., Starting Wed 01/31/2017, Normal          Belinda Fisher, PA-C 01/31/17 1733

## 2017-01-31 NOTE — Discharge Instructions (Signed)
Take azithromycin and prednisone as directed. Continue albuterol and inhalers as directed. You can take over the counter mucinex to help with your symptoms. You can use over the counter nasal saline rinse such as neti pot for nasal congestion. Keep hydrated, your urine should be clear to pale yellow in color. Tylenol/motrin for fever and pain. Follow up with PCP for further evaluation and management needed. Monitor for any worsening of symptoms, chest pain, shortness of breath, wheezing, swelling of the throat, follow up for reevaluation.

## 2017-02-09 ENCOUNTER — Emergency Department (HOSPITAL_COMMUNITY)
Admission: EM | Admit: 2017-02-09 | Discharge: 2017-02-09 | Disposition: A | Discharge status: Home / Self Care | Payer: Medicaid Other | Attending: Emergency Medicine | Admitting: Emergency Medicine

## 2017-02-09 ENCOUNTER — Emergency Department (HOSPITAL_COMMUNITY): Payer: Medicaid Other

## 2017-02-09 ENCOUNTER — Encounter (HOSPITAL_COMMUNITY): Payer: Self-pay | Admitting: *Deleted

## 2017-02-09 DIAGNOSIS — Z79899 Other long term (current) drug therapy: Secondary | ICD-10-CM | POA: Diagnosis not present

## 2017-02-09 DIAGNOSIS — J4541 Moderate persistent asthma with (acute) exacerbation: Secondary | ICD-10-CM | POA: Diagnosis present

## 2017-02-09 DIAGNOSIS — R05 Cough: Secondary | ICD-10-CM | POA: Insufficient documentation

## 2017-02-09 DIAGNOSIS — Z87891 Personal history of nicotine dependence: Secondary | ICD-10-CM | POA: Diagnosis not present

## 2017-02-09 MED ORDER — IPRATROPIUM-ALBUTEROL 0.5-2.5 (3) MG/3ML IN SOLN
3.0000 mL | Freq: Once | RESPIRATORY_TRACT | Status: AC
  Administered 2017-02-09: 3 mL via RESPIRATORY_TRACT

## 2017-02-09 MED ORDER — IPRATROPIUM-ALBUTEROL 0.5-2.5 (3) MG/3ML IN SOLN
RESPIRATORY_TRACT | Status: AC
  Filled 2017-02-09: qty 3

## 2017-02-09 MED ORDER — ALBUTEROL (5 MG/ML) CONTINUOUS INHALATION SOLN: 10.0000 mg/h | INHALATION_SOLUTION | Freq: Once | RESPIRATORY_TRACT | Status: DC

## 2017-02-09 MED ORDER — ALBUTEROL (5 MG/ML) CONTINUOUS INHALATION SOLN
15.0000 mg/h | INHALATION_SOLUTION | Freq: Once | RESPIRATORY_TRACT | Status: AC
  Administered 2017-02-09: 15 mg/h via RESPIRATORY_TRACT
  Filled 2017-02-09: qty 3

## 2017-02-09 MED ORDER — ALBUTEROL (5 MG/ML) CONTINUOUS INHALATION SOLN
10.0000 mg/h | INHALATION_SOLUTION | RESPIRATORY_TRACT | Status: DC
  Administered 2017-02-09: 10 mg/h via RESPIRATORY_TRACT

## 2017-02-09 MED ORDER — DEXAMETHASONE SODIUM PHOSPHATE 10 MG/ML IJ SOLN
10.0000 mg | Freq: Once | INTRAMUSCULAR | Status: AC
  Administered 2017-02-09: 10 mg via INTRAMUSCULAR
  Filled 2017-02-09: qty 1

## 2017-02-09 NOTE — ED Provider Notes (Signed)
[facility] EMERGENCY DEPARTMENT Provider Note   CSN: [id number] Arrival date & time: 02/09/17  1022     History   Chief Complaint Chief Complaint  Patient presents with  . Asthma    HPI Wendy Morgan is a 41 y.o. female.  Patient with history of asthma presents with worsening cough and shortness of breath for the past week. Patient did recently finish azithromycin without improvement. Patient recently finished steroids. Patient feels worse the past few days. Patient feels this is identical to asthma history. Patient denies causing blood clot risk factors. Patient denies leg swelling or cardiac history. No chest pain. No fevers. Patient does have her medicines for asthma is taking them at home.      Past Medical History:  Diagnosis Date  . Asthma   . Bronchiectasis with acute exacerbation (HCC)     There are no active problems to display for this patient.   History reviewed. No pertinent surgical history.  OB History    No data available       Home Medications    Prior to Admission medications   Medication Sig Start Date End Date Taking? Authorizing Provider  albuterol (PROVENTIL) (2.5 MG/3ML) 0.083% nebulizer solution Take 3 mLs (2.5 mg total) by nebulization every 6 (six) hours as needed for wheezing or shortness of breath. 01/20/17   Bethel Born, PA-C  azithromycin (ZITHROMAX) 250 MG tablet Take 1 tablet (250 mg total) by mouth daily. Take first 2 tablets together, then 1 every day until finished. 01/31/17   Cathie Hoops, Amy V, PA-C  Fluticasone-Salmeterol (ADVAIR) 250-50 MCG/DOSE AEPB Inhale 1 puff into the lungs 2 (two) times daily. Please instruct in usage 10/26/16   Dorena Bodo, NP  ipratropium (ATROVENT HFA) 17 MCG/ACT inhaler Inhale 2 puffs into the lungs every 6 (six) hours as needed for wheezing. 10/11/16   Bethel Born, PA-C  predniSONE (DELTASONE) 20 MG tablet Take 2 tablets (40 mg total) by mouth daily. 01/31/17   Belinda Fisher,  PA-C    Family History No family history on file.  Social History Social History  Substance Use Topics  . Smoking status: Former Smoker    Quit date: 1998  . Smokeless tobacco: Never Used  . Alcohol use No     Allergies   Patient has no known allergies.   Review of Systems Review of Systems  Constitutional: Negative for chills and fever.  HENT: Negative for congestion.   Eyes: Negative for visual disturbance.  Respiratory: Positive for cough, shortness of breath and wheezing.   Cardiovascular: Negative for chest pain.  Gastrointestinal: Negative for abdominal pain and vomiting.  Genitourinary: Negative for dysuria and flank pain.  Musculoskeletal: Negative for back pain, neck pain and neck stiffness.  Skin: Negative for rash.  Neurological: Negative for light-headedness and headaches.     Physical Exam Updated Vital Signs BP 130/85   Pulse 74   Temp 97.8 F (36.6 C) (Oral)   Resp 15   SpO2 99%   Physical Exam  Constitutional: She appears well-developed and well-nourished. No distress.  HENT:  Head: Normocephalic and atraumatic.  Eyes: Conjunctivae are normal.  Neck: Neck supple.  Cardiovascular: Regular rhythm.   Pulmonary/Chest: No respiratory distress. She has wheezes. She has no rales.  Abdominal: Soft. There is no tenderness.  Musculoskeletal: She exhibits no edema.  Neurological: She is alert.  Skin: Skin is warm and dry.  Psychiatric: She has a normal mood and affect.  Nursing note and  vitals reviewed.    ED Treatments / Results  Labs (all labs ordered are listed, but only abnormal results are displayed) Labs Reviewed - No data to display  EKG  EKG Interpretation None       Radiology No results found.  Procedures Procedures (including critical care time) CRITICAL CARE Performed by: Enid SkeensZAVITZ, Jalexia Lalli M   Total critical care time: 35 minutes  Critical care time was exclusive of separately billable procedures and treating other  patients.  Critical care was necessary to treat or prevent imminent or life-threatening deterioration.  Critical care was time spent personally by me on the following activities: development of treatment plan with patient and/or surrogate as well as nursing, discussions with consultants, evaluation of patient's response to treatment, examination of patient, obtaining history from patient or surrogate, ordering and performing treatments and interventions, ordering and review of laboratory studies, ordering and review of radiographic studies, pulse oximetry and re-evaluation of patient's condition.  Medications Ordered in ED Medications  ipratropium-albuterol (DUONEB) 0.5-2.5 (3) MG/3ML nebulizer solution (  Not Given 02/09/17 1203)  albuterol (PROVENTIL,VENTOLIN) solution continuous neb (10 mg/hr Nebulization New Bag/Given 02/09/17 1139)  albuterol (PROVENTIL,VENTOLIN) solution continuous neb (not administered)  dexamethasone (DECADRON) injection 10 mg (not administered)  ipratropium-albuterol (DUONEB) 0.5-2.5 (3) MG/3ML nebulizer solution 3 mL (3 mLs Nebulization Given 02/09/17 1106)     Initial Impression / Assessment and Plan / ED Course  I have reviewed the triage vital signs and the nursing notes.  Pertinent labs & imaging results that were available during my care of the patient were reviewed by me and considered in my medical decision making (see chart for details).    Patient with significant asthma history presents with similar presentation. With worsening wheezing and shortness of breath despite finishing antibiotics planned for chest x-ray, continuous neb given due to work of breathing and minimal response to previous neb, steroids and reassessment.  Recheck.  Patient improved significantly on reassessment.  Patient comfortable going home and has home medicines.  Decadron given. Results and differential diagnosis were discussed with the patient/parent/guardian. Xrays were  independently reviewed by myself.  Close follow up outpatient was discussed, comfortable with the plan.   Medications  ipratropium-albuterol (DUONEB) 0.5-2.5 (3) MG/3ML nebulizer solution (  Not Given 02/09/17 1203)  albuterol (PROVENTIL,VENTOLIN) solution continuous neb (10 mg/hr Nebulization New Bag/Given 02/09/17 1139)  albuterol (PROVENTIL,VENTOLIN) solution continuous neb (not administered)  dexamethasone (DECADRON) injection 10 mg (not administered)  ipratropium-albuterol (DUONEB) 0.5-2.5 (3) MG/3ML nebulizer solution 3 mL (3 mLs Nebulization Given 02/09/17 1106)    Vitals:   02/09/17 1140 02/09/17 1332 02/09/17 1419 02/09/17 1445  BP:  123/76 119/82 130/85  Pulse: 77 79 79 74  Resp: 18 14 15 15   Temp:  97.8 F (36.6 C)    TempSrc:  Oral    SpO2: 98% 98% 98% 99%    Final diagnoses:  Moderate persistent asthma with acute exacerbation     Final Clinical Impressions(s) / ED Diagnoses   Final diagnoses:  Moderate persistent asthma with acute exacerbation    New Prescriptions New Prescriptions   No medications on file     Blane OharaZavitz, Annabell Oconnor, MD 02/09/17 1651

## 2017-02-09 NOTE — ED Notes (Signed)
Patient transported to X-ray 

## 2017-02-09 NOTE — ED Triage Notes (Signed)
Pt reports continued cough and wheezing. Pt was given a z-pack last week but reports no improvement. Pt states that she has been taking her inhaler and nebulizer at home with no relief.

## 2017-02-09 NOTE — ED Notes (Signed)
Pt on continuous neb.  

## 2017-02-09 NOTE — Discharge Instructions (Signed)
Continue to take her nebulizers and treatments at home as needed. See your physician if your symptoms are different than normal or worsening breathing problems.

## 2017-02-15 ENCOUNTER — Ambulatory Visit (HOSPITAL_COMMUNITY)
Admission: EM | Admit: 2017-02-15 | Discharge: 2017-02-15 | Disposition: A | Discharge status: Home / Self Care | Payer: Medicaid Other | Attending: Emergency Medicine | Admitting: Emergency Medicine

## 2017-02-15 ENCOUNTER — Telehealth (HOSPITAL_COMMUNITY): Payer: Self-pay | Admitting: Family Medicine

## 2017-02-15 ENCOUNTER — Encounter (HOSPITAL_COMMUNITY): Payer: Self-pay | Admitting: Emergency Medicine

## 2017-02-15 ENCOUNTER — Ambulatory Visit (INDEPENDENT_AMBULATORY_CARE_PROVIDER_SITE_OTHER): Payer: Medicaid Other

## 2017-02-15 DIAGNOSIS — J4541 Moderate persistent asthma with (acute) exacerbation: Secondary | ICD-10-CM | POA: Diagnosis not present

## 2017-02-15 DIAGNOSIS — R05 Cough: Secondary | ICD-10-CM | POA: Diagnosis not present

## 2017-02-15 DIAGNOSIS — R0602 Shortness of breath: Secondary | ICD-10-CM

## 2017-02-15 MED ORDER — FLUTICASONE-SALMETEROL 250-50 MCG/DOSE IN AEPB: 1.0000 | INHALATION_SPRAY | Freq: Two times a day (BID) | RESPIRATORY_TRACT | 0 refills | Status: DC

## 2017-02-15 MED ORDER — METHYLPREDNISOLONE SODIUM SUCC 125 MG IJ SOLR
80.0000 mg | Freq: Once | INTRAMUSCULAR | Status: AC
  Administered 2017-02-15: 80 mg via INTRAMUSCULAR

## 2017-02-15 MED ORDER — IPRATROPIUM-ALBUTEROL 0.5-2.5 (3) MG/3ML IN SOLN
RESPIRATORY_TRACT | Status: AC
  Filled 2017-02-15: qty 3

## 2017-02-15 MED ORDER — IPRATROPIUM-ALBUTEROL 0.5-2.5 (3) MG/3ML IN SOLN
3.0000 mL | Freq: Once | RESPIRATORY_TRACT | Status: AC
  Administered 2017-02-15: 3 mL via RESPIRATORY_TRACT

## 2017-02-15 MED ORDER — METHYLPREDNISOLONE SODIUM SUCC 125 MG IJ SOLR
INTRAMUSCULAR | Status: AC
  Filled 2017-02-15: qty 2

## 2017-02-15 MED ORDER — PREDNISONE 10 MG (21) PO TBPK: ORAL_TABLET | Freq: Every day | ORAL | 0 refills | Status: DC

## 2017-02-15 NOTE — Discharge Instructions (Signed)
Continue to use your inhaler as needed. Push fluids. Complete course of steroids as prescribed. Please follow up with your PCP in the next week for recheck. If develop increased pain, shortness of breath or fevers please return to be seen

## 2017-02-15 NOTE — ED Provider Notes (Signed)
[facility]    CSN: [id number] Arrival date & time: 02/15/17  1424     History   Chief Complaint Chief Complaint  Patient presents with  . Asthma    HPI Wendy Morgan is a 41 y.o. female.   [name] presents with complaints of cough and shortness of breath. She has been battling this for approximately 2 weeks. She was seen on 10/17 and 10/26. She completed a zpack and prednisone prescribed 10/17. They only mildly helped. She was seen and treated on 10/26 without any new prescriptions, was given decadron which helped some. She saw her PCP 4 days ago, her inhaler was refilled but no other medications given. Denies fevers, ear pain, throat pain. Her cough is productive. Denies pain or chest pain, dizziness, known allergen exposures. She is not on birth control, without leg pain. States feels similar to other asthma exacerbations. Denies gi/gu complaints.    ROS per HPI.        Past Medical History:  Diagnosis Date  . Asthma   . Bronchiectasis with acute exacerbation (HCC)     There are no active problems to display for this patient.   History reviewed. No pertinent surgical history.  OB History    No data available       Home Medications    Prior to Admission medications   Medication Sig Start Date End Date Taking? Authorizing Provider  albuterol (PROVENTIL) (2.5 MG/3ML) 0.083% nebulizer solution Take 3 mLs (2.5 mg total) by nebulization every 6 (six) hours as needed for wheezing or shortness of breath. 01/20/17   Bethel Born, PA-C  azithromycin (ZITHROMAX) 250 MG tablet Take 1 tablet (250 mg total) by mouth daily. Take first 2 tablets together, then 1 every day until finished. 01/31/17   Cathie Hoops, Amy V, PA-C  Fluticasone-Salmeterol (ADVAIR) 250-50 MCG/DOSE AEPB Inhale 1 puff into the lungs 2 (two) times daily. Please instruct in usage 02/15/17   Georgetta Haber, NP  ipratropium (ATROVENT HFA) 17 MCG/ACT inhaler Inhale 2 puffs into the lungs every 6  (six) hours as needed for wheezing. 10/11/16   Bethel Born, PA-C  predniSONE (STERAPRED UNI-PAK 21 TAB) 10 MG (21) TBPK tablet Take by mouth daily. Take 6 tabs by mouth daily  for 2 days, then 5 tabs for 2 days, then 4 tabs for 2 days, then 3 tabs for 2 days, 2 tabs for 2 days, then 1 tab by mouth daily for 2 days 02/15/17   Georgetta Haber, NP    Family History No family history on file.  Social History Social History  Substance Use Topics  . Smoking status: Former Smoker    Quit date: 1998  . Smokeless tobacco: Never Used  . Alcohol use No     Allergies   Patient has no known allergies.   Review of Systems Review of Systems   Physical Exam Triage Vital Signs ED Triage Vitals  Enc Vitals Group     BP 02/15/17 1432 (!) 145/93     Pulse Rate 02/15/17 1432 (!) 105     Resp 02/15/17 1432 (!) 30     Temp 02/15/17 1432 98.6 F (37 C)     Temp Source 02/15/17 1432 Oral     SpO2 02/15/17 1432 100 %     Weight --      Height --      Head Circumference --      Peak Flow --      Pain Score  02/15/17 1433 0     Pain Loc --      Pain Edu? --      Excl. in GC? --    No data found.   Updated Vital Signs BP (!) 145/93   Pulse (!) 105   Temp 98.6 F (37 C) (Oral)   Resp (!) 30   LMP 02/01/2017 (Exact Date)   SpO2 100%   Visual Acuity Right Eye Distance:   Left Eye Distance:   Bilateral Distance:    Right Eye Near:   Left Eye Near:    Bilateral Near:     Physical Exam  Constitutional: She is oriented to person, place, and time. She appears well-developed and well-nourished. No distress.  Cardiovascular: Normal rate, regular rhythm and normal heart sounds.   Pulmonary/Chest: Effort normal. No respiratory distress. She has wheezes in the right upper field, the right lower field, the left upper field and the left lower field. She exhibits no tenderness.  Neurological: She is alert and oriented to person, place, and time.  Skin: Skin is warm and dry.  Vitals  reviewed.    UC Treatments / Results  Labs (all labs ordered are listed, but only abnormal results are displayed) Labs Reviewed - No data to display  EKG  EKG Interpretation None       Radiology Dg Chest 2 View  Result Date: 02/15/2017 CLINICAL DATA:  Productive cough, shortness of breath. EXAM: CHEST  2 VIEW COMPARISON:  Radiographs of February 09, 2017. FINDINGS: The heart size and mediastinal contours are within normal limits. Both lungs are clear. No pneumothorax or pleural effusion is noted. The visualized skeletal structures are unremarkable. IMPRESSION: No active cardiopulmonary disease. Electronically Signed   By: Lupita RaiderJames  Green Jr, M.D.   On: 02/15/2017 16:08    Procedures Procedures (including critical care time)  Medications Ordered in UC Medications  ipratropium-albuterol (DUONEB) 0.5-2.5 (3) MG/3ML nebulizer solution 3 mL (3 mLs Nebulization Given 02/15/17 1437)  methylPREDNISolone sodium succinate (SOLU-MEDROL) 125 mg/2 mL injection 80 mg (80 mg Intramuscular Given 02/15/17 1504)     Initial Impression / Assessment and Plan / UC Course  I have reviewed the triage vital signs and the nursing notes.  Pertinent labs & imaging results that were available during my care of the patient were reviewed by me and considered in my medical decision making (see chart for details).  Clinical Course as of Feb 15 1630  Thu Feb 15, 2017  1500 Tolerated Duoneb, will provide IM solumedrol at this time for wheezing and tachypnea  [NB]  1539 HR 89, 02 95%, RR 24  [NB]  1628 HR 80, 02 95%, RR 22  [NB]    Clinical Course User Index [NB] [name], [name], NP    Lung sounds, respiratory rate improvement noted s/p duoneb and solumedrol. Patient states she feels much improved. Negative x ray. Low suspicion for PE at this time based on vitals, history and physical findings. Prednisone, restarted her advair inhaler as well. Follow up with PCP next week for recheck. If symptoms worsen or do  not improve in the next week to return to be seen or to follow up with PCP. Patient verbalized understanding and agreeable to plan.  Ambulatory out of clinic without difficulty.    Final Clinical Impressions(s) / UC Diagnoses   Final diagnoses:  Moderate persistent asthma with exacerbation    New Prescriptions New Prescriptions   PREDNISONE (STERAPRED UNI-PAK 21 TAB) 10 MG (21) TBPK TABLET  Take by mouth daily. Take 6 tabs by mouth daily  for 2 days, then 5 tabs for 2 days, then 4 tabs for 2 days, then 3 tabs for 2 days, 2 tabs for 2 days, then 1 tab by mouth daily for 2 days     Controlled Substance Prescriptions [place] Controlled Substance Registry consulted? Not Applicable   Georgetta Haber, NP 02/15/17 [phone]

## 2017-02-15 NOTE — ED Triage Notes (Signed)
Pt c/o asthma problems x2 weeks, given inhaler by PCP without relief. Pt audible wheezing.

## 2017-04-26 ENCOUNTER — Encounter (HOSPITAL_COMMUNITY): Payer: Self-pay | Admitting: Emergency Medicine

## 2017-04-26 ENCOUNTER — Ambulatory Visit (HOSPITAL_COMMUNITY)
Admission: EM | Admit: 2017-04-26 | Discharge: 2017-04-26 | Disposition: A | Discharge status: Home / Self Care | Payer: Medicaid Other | Attending: Family Medicine | Admitting: Family Medicine

## 2017-04-26 DIAGNOSIS — J0140 Acute pansinusitis, unspecified: Secondary | ICD-10-CM | POA: Diagnosis not present

## 2017-04-26 DIAGNOSIS — J4521 Mild intermittent asthma with (acute) exacerbation: Secondary | ICD-10-CM | POA: Diagnosis not present

## 2017-04-26 MED ORDER — AMOXICILLIN-POT CLAVULANATE 875-125 MG PO TABS: 1.0000 | ORAL_TABLET | Freq: Two times a day (BID) | ORAL | 0 refills | Status: DC

## 2017-04-26 MED ORDER — IPRATROPIUM-ALBUTEROL 0.5-2.5 (3) MG/3ML IN SOLN
3.0000 mL | Freq: Once | RESPIRATORY_TRACT | Status: AC
  Administered 2017-04-26: 3 mL via RESPIRATORY_TRACT

## 2017-04-26 MED ORDER — IPRATROPIUM-ALBUTEROL 0.5-2.5 (3) MG/3ML IN SOLN
RESPIRATORY_TRACT | Status: AC
  Filled 2017-04-26: qty 3

## 2017-04-26 MED ORDER — PREDNISONE 10 MG (21) PO TBPK: ORAL_TABLET | Freq: Every day | ORAL | 0 refills | Status: DC

## 2017-04-26 MED ORDER — FLUTICASONE PROPIONATE 50 MCG/ACT NA SUSP: 2.0000 | Freq: Every day | NASAL | 0 refills | Status: DC

## 2017-04-26 NOTE — ED Triage Notes (Signed)
PT C/O: cold sx onset 2 weeks associated w/nasal congestion/drainage, dry cough,   Sts cold sx has flared up her asthma... Sx include SOB/dyspnea and mild wheezing  DENIES: fevers  TAKING MEDS: Albuterol inhaler, OTC Sinus meds  A&O x4... NAD... Ambulatory

## 2017-04-26 NOTE — Discharge Instructions (Signed)
Start Augmentin for sinus infection.  Continue decongestants for nasal congestion.  Start Flonase for nasal congestion.  Continue albuterol as needed for asthma flareup, take prednisone as directed.  Keep hydrated, your urine should be clear to pale yellow in color.  Follow-up with PCP for further evaluation and management of asthma.  If experiencing worsening symptoms, chest pain, shortness of breath, weakness, dizziness follow-up for reevaluation.

## 2017-04-26 NOTE — ED Provider Notes (Signed)
[facility]    CSN: [id number] Arrival date & time: 04/26/17  1612     History   Chief Complaint Chief Complaint  Patient presents with  . URI  . Asthma    HPI Wendy Morgan is a 42 y.o. female.   42 year old female with history of asthma comes in for 2 week history of URI symptoms. She has had nonproductive cough, nasal congestion, rhinorrhea. Denies ear pain, states has pressure.  Denies fever, chills, night sweats.  States for the past 3 days, she has had increased wheezing with shortness of breath and dyspnea on exertion, that is relieved with albuterol.  States she usually uses albuterol twice a day, since asthma flareup, she has been using it 3-4 times a day.  Last use albuterol 3 hours ago.  She has also taken sinus medications without relief.  Former smoker.      Past Medical History:  Diagnosis Date  . Asthma   . Bronchiectasis with acute exacerbation (HCC)     There are no active problems to display for this patient.   History reviewed. No pertinent surgical history.  OB History    No data available       Home Medications    Prior to Admission medications   Medication Sig Start Date End Date Taking? Authorizing Provider  albuterol (PROVENTIL) (2.5 MG/3ML) 0.083% nebulizer solution Take 3 mLs (2.5 mg total) by nebulization every 6 (six) hours as needed for wheezing or shortness of breath. 01/20/17  Yes Bethel BornGekas, Kelly Marie, PA-C  amoxicillin-clavulanate (AUGMENTIN) 875-125 MG tablet Take 1 tablet by mouth every 12 (twelve) hours. 04/26/17   Cathie HoopsYu, Micco Bourbeau V, PA-C  fluticasone (FLONASE) 50 MCG/ACT nasal spray Place 2 sprays into both nostrils daily. 04/26/17   Cathie HoopsYu, Hannah Strader V, PA-C  Fluticasone-Salmeterol (ADVAIR) 250-50 MCG/DOSE AEPB Inhale 1 puff into the lungs 2 (two) times daily. Please instruct in usage 02/15/17   Georgetta HaberBurky, [name], NP  ipratropium (ATROVENT HFA) 17 MCG/ACT inhaler Inhale 2 puffs into the lungs every 6 (six) hours as needed for wheezing.  10/11/16   Bethel BornGekas, Kelly Marie, PA-C  predniSONE (STERAPRED UNI-PAK 21 TAB) 10 MG (21) TBPK tablet Take by mouth daily. Take 6 tabs by mouth day 1, then 5 tabs, then 4 tabs, then 3 tabs, 2 tabs, then 1 tab for the last day 04/26/17   Belinda FisherYu, Lamoine Fredricksen V, PA-C    Family History History reviewed. No pertinent family history.  Social History Social History   Tobacco Use  . Smoking status: Former Smoker    Last attempt to quit: 1998    Years since quitting: 21.0  . Smokeless tobacco: Never Used  Substance Use Topics  . Alcohol use: No  . Drug use: No     Allergies   Patient has no known allergies.   Review of Systems Review of Systems  Reason unable to perform ROS: See HPI as above.     Physical Exam Triage Vital Signs ED Triage Vitals [04/26/17 1641]  Enc Vitals Group     BP 122/83     Pulse Rate 66     Resp 20     Temp 97.8 F (36.6 C)     Temp Source Oral     SpO2 98 %     Weight      Height      Head Circumference      Peak Flow      Pain Score  Pain Loc      Pain Edu?      Excl. in GC?    No data found.  Updated Vital Signs BP 122/83 (BP Location: Left Arm)   Pulse 66   Temp 97.8 F (36.6 C) (Oral)   Resp 20   LMP 04/24/2017   SpO2 98%   Physical Exam  Constitutional: She is oriented to person, place, and time. She appears well-developed and well-nourished. No distress.  HENT:  Head: Normocephalic and atraumatic.  Right Ear: External ear and ear canal normal. Tympanic membrane is erythematous. Tympanic membrane is not bulging.  Left Ear: External ear and ear canal normal. Tympanic membrane is erythematous. Tympanic membrane is not bulging.  Nose: Right sinus exhibits maxillary sinus tenderness and frontal sinus tenderness. Left sinus exhibits maxillary sinus tenderness and frontal sinus tenderness.  Mouth/Throat: Uvula is midline, oropharynx is clear and moist and mucous membranes are normal.  Eyes: Conjunctivae are normal. Pupils are equal, round, and  reactive to light.  Neck: Normal range of motion. Neck supple.  Cardiovascular: Normal rate, regular rhythm and normal heart sounds. Exam reveals no gallop and no friction rub.  No murmur heard. Pulmonary/Chest: Effort normal.  Diffuse inspiratory and expiratory wheezing, decreased air movement.  Lymphadenopathy:    She has no cervical adenopathy.  Neurological: She is alert and oriented to person, place, and time.  Skin: Skin is warm and dry.  Psychiatric: She has a normal mood and affect. Her behavior is normal. Judgment normal.     UC Treatments / Results  Labs (all labs ordered are listed, but only abnormal results are displayed) Labs Reviewed - No data to display  EKG  EKG Interpretation None       Radiology No results found.  Procedures Procedures (including critical care time)  Medications Ordered in UC Medications  ipratropium-albuterol (DUONEB) 0.5-2.5 (3) MG/3ML nebulizer solution 3 mL (3 mLs Nebulization Given 04/26/17 1712)     Initial Impression / Assessment and Plan / UC Course  I have reviewed the triage vital signs and the nursing notes.  Pertinent labs & imaging results that were available during my care of the patient were reviewed by me and considered in my medical decision making (see chart for details).    Patient with better air movement after DuoNeb, improved diffuse wheezing.  Patient states symptoms much relieved.  Given history and exam, will treat for sinusitis as well as asthma exacerbation.  Start Augmentin as directed.  Other symptomatic treatment discussed.  Prednisone as directed.  Continue albuterol as needed.  Return precautions given.  Patient expresses understanding and agrees to plan.  Final Clinical Impressions(s) / UC Diagnoses   Final diagnoses:  Mild intermittent asthma with acute exacerbation  Acute non-recurrent pansinusitis    ED Discharge Orders        Ordered    predniSONE (STERAPRED UNI-PAK 21 TAB) 10 MG (21) TBPK  tablet  Daily     04/26/17 1724    amoxicillin-clavulanate (AUGMENTIN) 875-125 MG tablet  Every 12 hours     04/26/17 1724    fluticasone (FLONASE) 50 MCG/ACT nasal spray  Daily     04/26/17 1724        Belinda Fisher, PA-C 04/26/17 2036

## 2017-05-05 ENCOUNTER — Encounter (HOSPITAL_COMMUNITY): Payer: Self-pay | Admitting: *Deleted

## 2017-05-05 ENCOUNTER — Emergency Department (HOSPITAL_COMMUNITY)
Admission: EM | Admit: 2017-05-05 | Discharge: 2017-05-05 | Disposition: A | Discharge status: Home / Self Care | Payer: Medicaid Other | Attending: Emergency Medicine | Admitting: Emergency Medicine

## 2017-05-05 ENCOUNTER — Other Ambulatory Visit: Payer: Self-pay

## 2017-05-05 DIAGNOSIS — J45901 Unspecified asthma with (acute) exacerbation: Secondary | ICD-10-CM | POA: Insufficient documentation

## 2017-05-05 DIAGNOSIS — R0602 Shortness of breath: Secondary | ICD-10-CM | POA: Diagnosis present

## 2017-05-05 MED ORDER — ALBUTEROL SULFATE (2.5 MG/3ML) 0.083% IN NEBU
5.0000 mg | INHALATION_SOLUTION | Freq: Once | RESPIRATORY_TRACT | Status: AC
  Administered 2017-05-05: 5 mg via RESPIRATORY_TRACT
  Filled 2017-05-05: qty 6

## 2017-05-05 MED ORDER — IPRATROPIUM BROMIDE 0.02 % IN SOLN
0.5000 mg | Freq: Once | RESPIRATORY_TRACT | Status: AC
  Administered 2017-05-05: 0.5 mg via RESPIRATORY_TRACT
  Filled 2017-05-05: qty 2.5

## 2017-05-05 MED ORDER — DEXAMETHASONE SODIUM PHOSPHATE 10 MG/ML IJ SOLN
10.0000 mg | Freq: Once | INTRAMUSCULAR | Status: AC
  Administered 2017-05-05: 10 mg via INTRAMUSCULAR
  Filled 2017-05-05: qty 1

## 2017-05-05 NOTE — ED Triage Notes (Signed)
The pt has asthma ans she has a cold she has an inhaler at home that is not helping  For one week  She was [facility] and given meds but that has not helped

## 2017-05-05 NOTE — ED Provider Notes (Signed)
Patient placed in Quick Look pathway, seen and evaluated   Chief Complaint: Asthma exacerbation  HPI:   Patient reports hx of asthma and cold symptoms x 1 week.  Reports worsening symptoms today.  [facility] and [facility].  Reports no improvement. Denies fever or vomiting.  Has used home inhaler.  Active Ambulatory Problems    Diagnosis Date Noted  . No Active Ambulatory Problems   Resolved Ambulatory Problems    Diagnosis Date Noted  . No Resolved Ambulatory Problems   Past Medical History:  Diagnosis Date  . Asthma   . Bronchiectasis with acute exacerbation (HCC)      ROS: SOB, wheezing, cough  Physical Exam:  Gen: A&O x4 HEENT: PERRL, EOM intact CHEST: Mild tachycardia RRR, no m/r/g LUNGS: Diminished lung sounds throughout, scattered wheezes ABD: BS x 4, ND/NT EXT: No edema, strong peripheral pulses NEURO: Sensation and strength intact bilaterally  Assessment:  Probable uncomplicated asthma exacerbation.  Plan:  Breathing treatments PRN until improved.  IM decadron.   Meds ordered this encounter  Medications  . dexamethasone (DECADRON) injection 10 mg  . albuterol (PROVENTIL) (2.5 MG/3ML) 0.083% nebulizer solution 5 mg  . ipratropium (ATROVENT) nebulizer solution 0.5 mg   6:00 AM Feels improved after neb.  Feels ready for discharge.  Diagnosis: Asthma exacerbation   Roxy HorsemanBrowning, Wendy Olmeda, PA-C 05/05/17 0602    Nira Morgan, Wendy Eduardo, MD 05/10/17 [phone]

## 2017-05-06 ENCOUNTER — Encounter (HOSPITAL_COMMUNITY): Payer: Self-pay | Admitting: Family Medicine

## 2017-05-06 ENCOUNTER — Ambulatory Visit (HOSPITAL_COMMUNITY)
Admission: EM | Admit: 2017-05-06 | Discharge: 2017-05-06 | Disposition: A | Discharge status: Home / Self Care | Payer: Medicaid Other | Attending: Internal Medicine | Admitting: Internal Medicine

## 2017-05-06 DIAGNOSIS — J00 Acute nasopharyngitis [common cold]: Secondary | ICD-10-CM

## 2017-05-06 DIAGNOSIS — J4531 Mild persistent asthma with (acute) exacerbation: Secondary | ICD-10-CM

## 2017-05-06 MED ORDER — IPRATROPIUM BROMIDE 0.06 % NA SOLN: 2.0000 | Freq: Four times a day (QID) | NASAL | 0 refills | Status: DC

## 2017-05-06 MED ORDER — IPRATROPIUM-ALBUTEROL 0.5-2.5 (3) MG/3ML IN SOLN
RESPIRATORY_TRACT | Status: AC
  Filled 2017-05-06: qty 3

## 2017-05-06 MED ORDER — CETIRIZINE-PSEUDOEPHEDRINE ER 5-120 MG PO TB12: 1.0000 | ORAL_TABLET | Freq: Every day | ORAL | 0 refills | Status: DC

## 2017-05-06 MED ORDER — IPRATROPIUM-ALBUTEROL 0.5-2.5 (3) MG/3ML IN SOLN
3.0000 mL | Freq: Once | RESPIRATORY_TRACT | Status: AC
  Administered 2017-05-06: 3 mL via RESPIRATORY_TRACT

## 2017-05-06 MED ORDER — PREDNISONE 10 MG (21) PO TBPK: ORAL_TABLET | Freq: Every day | ORAL | 0 refills | Status: DC

## 2017-05-06 NOTE — ED Triage Notes (Signed)
Pt here for URI symptoms and asthma flare up. sts x a few days. Was seen 2 days ago and given steroid shot and  Been using albuterol inhaler without relief. sts previously before that on abx and steroid. Denies use of allergy medication.

## 2017-05-06 NOTE — Discharge Instructions (Signed)
Start prednisone as directed.  Continue albuterol, Atrovent, Advair inhaler as directed.  Continue Flonase.  Start Zyrtec-D, Atrovent nasal spray for nasal congestion and drainage. You can use over the counter nasal saline rinse such as neti pot for nasal congestion. Keep hydrated, your urine should be clear to pale yellow in color. Tylenol/motrin for fever and pain. Monitor for any worsening of symptoms, chest pain, shortness of breath, wheezing, swelling of the throat, follow up for reevaluation.

## 2017-05-06 NOTE — ED Provider Notes (Signed)
[facility]    CSN: [id number] Arrival date & time: 05/06/17  1845     History   Chief Complaint Chief Complaint  Patient presents with  . Asthma    HPI Wendy Morgan is a 42 y.o. female.   42 year old female with history of asthma comes in for a few day history of URI symptoms and asthma flareup.  She was seen in emergency department 2 days ago and was given a Decadron injection and DuoNeb treatment.  She has continued to have wheezing with no relief on albuterol inhaler.  She was seen 10 days ago here for sinusitis and was on Augmentin and steroids, stating symptoms completely resolved prior to current symptom onset. Former smoker.       Past Medical History:  Diagnosis Date  . Asthma   . Bronchiectasis with acute exacerbation (HCC)     There are no active problems to display for this patient.   History reviewed. No pertinent surgical history.  OB History    No data available       Home Medications    Prior to Admission medications   Medication Sig Start Date End Date Taking? Authorizing Provider  albuterol (PROVENTIL) (2.5 MG/3ML) 0.083% nebulizer solution Take 3 mLs (2.5 mg total) by nebulization every 6 (six) hours as needed for wheezing or shortness of breath. 01/20/17   Bethel BornGekas, Kelly Marie, PA-C  cetirizine-pseudoephedrine (ZYRTEC-D) 5-120 MG tablet Take 1 tablet by mouth daily. 05/06/17   Cathie HoopsYu, Dajon Rowe V, PA-C  fluticasone (FLONASE) 50 MCG/ACT nasal spray Place 2 sprays into both nostrils daily. 04/26/17   Cathie HoopsYu, Marsean Elkhatib V, PA-C  Fluticasone-Salmeterol (ADVAIR) 250-50 MCG/DOSE AEPB Inhale 1 puff into the lungs 2 (two) times daily. Please instruct in usage 02/15/17   Georgetta HaberBurky, [name], NP  ipratropium (ATROVENT HFA) 17 MCG/ACT inhaler Inhale 2 puffs into the lungs every 6 (six) hours as needed for wheezing. 10/11/16   Bethel BornGekas, Kelly Marie, PA-C  ipratropium (ATROVENT) 0.06 % nasal spray Place 2 sprays into both nostrils 4 (four) times daily. 05/06/17   Cathie HoopsYu, Conswella Bruney V,  PA-C  predniSONE (STERAPRED UNI-PAK 21 TAB) 10 MG (21) TBPK tablet Take by mouth daily. Take 6 tabs by mouth day 1, then 5 tabs, then 4 tabs, then 3 tabs, 2 tabs, then 1 tab for the last day 05/06/17   Belinda FisherYu, Emmett Bracknell V, PA-C    Family History History reviewed. No pertinent family history.  Social History Social History   Tobacco Use  . Smoking status: Former Smoker    Last attempt to quit: 1998    Years since quitting: 21.0  . Smokeless tobacco: Never Used  Substance Use Topics  . Alcohol use: No  . Drug use: No     Allergies   Patient has no known allergies.   Review of Systems Review of Systems  Reason unable to perform ROS: See HPI as above.     Physical Exam Triage Vital Signs ED Triage Vitals [05/06/17 1919]  Enc Vitals Group     BP 121/84     Pulse Rate 91     Resp 20     Temp 98.1 F (36.7 C)     Temp src      SpO2 97 %     Weight      Height      Head Circumference      Peak Flow      Pain Score      Pain Loc  Pain Edu?      Excl. in GC?    No data found.  Updated Vital Signs BP 121/84   Pulse 91   Temp 98.1 F (36.7 C)   Resp 20   LMP 04/24/2017   SpO2 97%   Physical Exam  Constitutional: She is oriented to person, place, and time. She appears well-developed and well-nourished. No distress.  Audible wheezing, however able to form full sentences.  HENT:  Head: Normocephalic and atraumatic.  Right Ear: External ear and ear canal normal. Tympanic membrane is erythematous. Tympanic membrane is not bulging.  Left Ear: External ear and ear canal normal. Tympanic membrane is erythematous. Tympanic membrane is not bulging.  Nose: Mucosal edema and rhinorrhea present. Right sinus exhibits no maxillary sinus tenderness and no frontal sinus tenderness. Left sinus exhibits no maxillary sinus tenderness and no frontal sinus tenderness.  Mouth/Throat: Uvula is midline, oropharynx is clear and moist and mucous membranes are normal.  Eyes: Conjunctivae are  normal. Pupils are equal, round, and reactive to light.  Neck: Normal range of motion. Neck supple.  Cardiovascular: Normal rate, regular rhythm and normal heart sounds. Exam reveals no gallop and no friction rub.  No murmur heard. Pulmonary/Chest: Effort normal.  Diffuse inspiratory and expiratory wheezing throughout.  Lymphadenopathy:    She has no cervical adenopathy.  Neurological: She is alert and oriented to person, place, and time.  Skin: Skin is warm and dry.  Psychiatric: She has a normal mood and affect. Her behavior is normal. Judgment normal.     UC Treatments / Results  Labs (all labs ordered are listed, but only abnormal results are displayed) Labs Reviewed - No data to display  EKG  EKG Interpretation None       Radiology No results found.  Procedures Procedures (including critical care time)  Medications Ordered in UC Medications  ipratropium-albuterol (DUONEB) 0.5-2.5 (3) MG/3ML nebulizer solution 3 mL (3 mLs Nebulization Given 05/06/17 2014)     Initial Impression / Assessment and Plan / UC Course  I have reviewed the triage vital signs and the nursing notes.  Pertinent labs & imaging results that were available during my care of the patient were reviewed by me and considered in my medical decision making (see chart for details).    Patient with resolved wheezing with improved air movement after DuoNeb treatment.  Able to speak in full sentences with audible wheezing.  Patient expresses improved symptoms.  Discussed with patient likely viral URI triggering asthma exacerbation.  Start prednisone as directed.  Symptomatic treatment discussed.  Return precautions given.  Patient to follow-up with PCP for further management of asthma.  Patient expresses understanding and agrees to plan.  Final Clinical Impressions(s) / UC Diagnoses   Final diagnoses:  Mild persistent asthma with exacerbation  Acute nasopharyngitis    ED Discharge Orders         Ordered    predniSONE (STERAPRED UNI-PAK 21 TAB) 10 MG (21) TBPK tablet  Daily     05/06/17 2030    ipratropium (ATROVENT) 0.06 % nasal spray  4 times daily     05/06/17 2030    cetirizine-pseudoephedrine (ZYRTEC-D) 5-120 MG tablet  Daily     05/06/17 2030         Belinda Fisher, PA-C 05/07/17 1009

## 2017-06-03 ENCOUNTER — Emergency Department (HOSPITAL_COMMUNITY): Payer: Medicaid Other

## 2017-06-03 ENCOUNTER — Emergency Department (HOSPITAL_COMMUNITY)
Admission: EM | Admit: 2017-06-03 | Discharge: 2017-06-03 | Disposition: A | Discharge status: Home / Self Care | Payer: Medicaid Other | Attending: Emergency Medicine | Admitting: Emergency Medicine

## 2017-06-03 ENCOUNTER — Encounter (HOSPITAL_COMMUNITY): Payer: Self-pay | Admitting: Emergency Medicine

## 2017-06-03 ENCOUNTER — Other Ambulatory Visit: Payer: Self-pay

## 2017-06-03 DIAGNOSIS — Z79899 Other long term (current) drug therapy: Secondary | ICD-10-CM | POA: Insufficient documentation

## 2017-06-03 DIAGNOSIS — J4521 Mild intermittent asthma with (acute) exacerbation: Secondary | ICD-10-CM | POA: Diagnosis not present

## 2017-06-03 DIAGNOSIS — Z87891 Personal history of nicotine dependence: Secondary | ICD-10-CM | POA: Diagnosis not present

## 2017-06-03 DIAGNOSIS — R0602 Shortness of breath: Secondary | ICD-10-CM | POA: Diagnosis present

## 2017-06-03 MED ORDER — PREDNISONE 10 MG (21) PO TBPK: ORAL_TABLET | Freq: Every day | ORAL | 0 refills | Status: DC

## 2017-06-03 MED ORDER — ALBUTEROL SULFATE (2.5 MG/3ML) 0.083% IN NEBU
5.0000 mg | INHALATION_SOLUTION | Freq: Once | RESPIRATORY_TRACT | Status: AC
  Administered 2017-06-03: 5 mg via RESPIRATORY_TRACT

## 2017-06-03 MED ORDER — BENZONATATE 100 MG PO CAPS: 100.0000 mg | ORAL_CAPSULE | Freq: Three times a day (TID) | ORAL | 0 refills | Status: DC

## 2017-06-03 NOTE — ED Provider Notes (Signed)
[facility] Provider Note   CSN: [id number] Arrival date & time: 06/03/17  0427     History   Chief Complaint Chief Complaint  Patient presents with  . Asthma    HPI Wendy Morgan is a 42 y.o. female.  HPI   42 year old female with history of asthma presenting for evaluation of shortness of breath.  Patient report for the past 3-4 days she has had worsening wheezing and shortness of breath.  Symptom is accompanied with some mild nasal congestion and nonproductive cough.  No report of headache, body aches, ear pain, sore throat or chest pain.  She used her inhaler 3-4 times daily which helps but her symptoms return.  She has a similar flareup a month ago when she was in the ED and discharged home with treatment.  She did receive a breathing treatment while waiting in the ER with improvement.  Currently she feels fine.  She denies any history of prior PE or DVT.   Past Medical History:  Diagnosis Date  . Asthma   . Bronchiectasis with acute exacerbation (HCC)     There are no active problems to display for this patient.   History reviewed. No pertinent surgical history.  OB History    No data available       Home Medications    Prior to Admission medications   Medication Sig Start Date End Date Taking? Authorizing Provider  albuterol (PROVENTIL) (2.5 MG/3ML) 0.083% nebulizer solution Take 3 mLs (2.5 mg total) by nebulization every 6 (six) hours as needed for wheezing or shortness of breath. 01/20/17   Bethel BornGekas, Kelly Marie, PA-C  cetirizine-pseudoephedrine (ZYRTEC-D) 5-120 MG tablet Take 1 tablet by mouth daily. 05/06/17   Cathie HoopsYu, Amy V, PA-C  fluticasone (FLONASE) 50 MCG/ACT nasal spray Place 2 sprays into both nostrils daily. 04/26/17   Cathie HoopsYu, Amy V, PA-C  Fluticasone-Salmeterol (ADVAIR) 250-50 MCG/DOSE AEPB Inhale 1 puff into the lungs 2 (two) times daily. Please instruct in usage 02/15/17   Georgetta HaberBurky, [name], NP  ipratropium (ATROVENT HFA)  17 MCG/ACT inhaler Inhale 2 puffs into the lungs every 6 (six) hours as needed for wheezing. 10/11/16   Bethel BornGekas, Kelly Marie, PA-C  ipratropium (ATROVENT) 0.06 % nasal spray Place 2 sprays into both nostrils 4 (four) times daily. 05/06/17   Cathie HoopsYu, Amy V, PA-C  predniSONE (STERAPRED UNI-PAK 21 TAB) 10 MG (21) TBPK tablet Take by mouth daily. Take 6 tabs by mouth day 1, then 5 tabs, then 4 tabs, then 3 tabs, 2 tabs, then 1 tab for the last day 05/06/17   Belinda FisherYu, Amy V, PA-C    Family History No family history on file.  Social History Social History   Tobacco Use  . Smoking status: Former Smoker    Last attempt to quit: 1998    Years since quitting: 21.1  . Smokeless tobacco: Never Used  Substance Use Topics  . Alcohol use: No  . Drug use: No     Allergies   Patient has no known allergies.   Review of Systems Review of Systems  All other systems reviewed and are negative.    Physical Exam Updated Vital Signs BP 119/79 (BP Location: Right Arm)   Pulse (!) 44   Temp (!) 97.4 F (36.3 C) (Oral)   Resp 20   Ht 5\' 4"  (1.626 m)   Wt 68 kg (150 lb)   SpO2 93%   BMI 25.75 kg/m   Physical Exam  Constitutional: She  appears well-developed and well-nourished. No distress.  Patient sitting up in bed resting comfortably in no acute discomfort.  HENT:  Head: Atraumatic.  Right Ear: External ear normal.  Left Ear: External ear normal.  Nose: Nose normal.  Mouth/Throat: Oropharynx is clear and moist.  Eyes: Conjunctivae are normal.  Neck: Normal range of motion. Neck supple. No JVD present.  Cardiovascular: Normal rate and regular rhythm.  Pulmonary/Chest: Effort normal. She has wheezes (Very faint wheeze without rales or rhonchi.).  Abdominal: Soft. Bowel sounds are normal. She exhibits no distension. There is no tenderness.  Musculoskeletal: She exhibits no edema.  Lymphadenopathy:    She has no cervical adenopathy.  Neurological: She is alert.  Skin: No rash noted.  Psychiatric:  She has a normal mood and affect.  Nursing note and vitals reviewed.    ED Treatments / Results  Labs (all labs ordered are listed, but only abnormal results are displayed) Labs Reviewed - No data to display  EKG  EKG Interpretation None       Radiology Dg Chest 2 View  Result Date: 06/03/2017 CLINICAL DATA:  Shortness of breath and cough.  Chest pain. EXAM: CHEST  2 VIEW COMPARISON:  Chest radiograph 02/15/2017 FINDINGS: The heart size and mediastinal contours are within normal limits. Both lungs are clear. The visualized skeletal structures are unremarkable. IMPRESSION: No active cardiopulmonary disease. Electronically Signed   By: Deatra Robinson M.D.   On: 06/03/2017 05:18    Procedures Procedures (including critical care time)  Medications Ordered in ED Medications  albuterol (PROVENTIL) (2.5 MG/3ML) 0.083% nebulizer solution 5 mg (5 mg Nebulization Given 06/03/17 0525)     Initial Impression / Assessment and Plan / ED Course  I have reviewed the triage vital signs and the nursing notes.  Pertinent labs & imaging results that were available during my care of the patient were reviewed by me and considered in my medical decision making (see chart for details).    BP 114/74   Pulse 75   Temp 97.7 F (36.5 C)   Resp 18   Ht 5\' 4"  (1.626 m)   Wt 68 kg (150 lb)   SpO2 93%   BMI 25.75 kg/m    Final Clinical Impressions(s) / ED Diagnoses   Final diagnoses:  Mild intermittent asthma with exacerbation    ED Discharge Orders        Ordered    predniSONE (STERAPRED UNI-PAK 21 TAB) 10 MG (21) TBPK tablet  Daily     06/03/17 0652    benzonatate (TESSALON) 100 MG capsule  Every 8 hours     06/03/17 0652     6:49 AM Patient here with increased wheezing and shortness of breath consistent with her asthma exacerbation.  After  receiving breathing treatment, patient felt much better.  Her chest x-ray is unremarkable no evidence of focal infiltrate concerning for  pneumonia.  Symptoms not consistent with flu.  At this time, patient will benefit from a course of steroid.  Cough medication prescribed.   Fayrene Helper, PA-C 06/03/17 0981    Rolland Porter, MD 06/07/17 [phone]

## 2017-06-03 NOTE — ED Triage Notes (Signed)
Pt c/o asthma flare up for the past few days, no fever or chills.

## 2017-06-09 ENCOUNTER — Other Ambulatory Visit: Payer: Self-pay

## 2017-06-09 ENCOUNTER — Encounter (HOSPITAL_COMMUNITY): Payer: Self-pay | Admitting: *Deleted

## 2017-06-09 ENCOUNTER — Ambulatory Visit (HOSPITAL_COMMUNITY)
Admission: EM | Admit: 2017-06-09 | Discharge: 2017-06-09 | Disposition: A | Discharge status: Home / Self Care | Payer: Medicaid Other | Attending: Emergency Medicine | Admitting: Emergency Medicine

## 2017-06-09 DIAGNOSIS — R05 Cough: Secondary | ICD-10-CM

## 2017-06-09 DIAGNOSIS — J4521 Mild intermittent asthma with (acute) exacerbation: Secondary | ICD-10-CM

## 2017-06-09 DIAGNOSIS — R062 Wheezing: Secondary | ICD-10-CM | POA: Diagnosis not present

## 2017-06-09 MED ORDER — AZITHROMYCIN 250 MG PO TABS: 250.0000 mg | ORAL_TABLET | Freq: Every day | ORAL | 0 refills | Status: DC

## 2017-06-09 MED ORDER — IPRATROPIUM-ALBUTEROL 0.5-2.5 (3) MG/3ML IN SOLN
3.0000 mL | Freq: Once | RESPIRATORY_TRACT | Status: AC
  Administered 2017-06-09: 3 mL via RESPIRATORY_TRACT

## 2017-06-09 MED ORDER — PREDNISONE 50 MG PO TABS: 50.0000 mg | ORAL_TABLET | Freq: Every day | ORAL | 0 refills | Status: AC

## 2017-06-09 MED ORDER — BENZONATATE 100 MG PO CAPS: 100.0000 mg | ORAL_CAPSULE | Freq: Three times a day (TID) | ORAL | 0 refills | Status: DC

## 2017-06-09 MED ORDER — ALBUTEROL SULFATE (2.5 MG/3ML) 0.083% IN NEBU: 2.5000 mg | INHALATION_SOLUTION | Freq: Four times a day (QID) | RESPIRATORY_TRACT | 0 refills | Status: DC | PRN

## 2017-06-09 MED ORDER — IPRATROPIUM-ALBUTEROL 0.5-2.5 (3) MG/3ML IN SOLN
RESPIRATORY_TRACT | Status: AC
  Filled 2017-06-09: qty 3

## 2017-06-09 NOTE — ED Provider Notes (Signed)
[facility]    CSN: [id number] Arrival date & time: 06/09/17  1700     History   Chief Complaint Chief Complaint  Patient presents with  . Wheezing  . Cough  . Nasal Congestion    HPI Wendy Morgan is a 42 y.o. female history of asthma presenting today with asthma flare.  States that she has had a lot of congestion in her chest, and has had 2 use her inhaler more frequently.  She is using pro-air, does not have any daily maintenance therapy of her asthma.  She was recently seen in the emergency room about a week ago and given a steroid taper pack.  Patient denies fevers, nausea, vomiting, abdominal pain, diarrhea.  Denies chest pain.  Patient has not had sore throat.  HPI  Past Medical History:  Diagnosis Date  . Asthma   . Bronchiectasis with acute exacerbation (HCC)     There are no active problems to display for this patient.   History reviewed. No pertinent surgical history.  OB History    No data available       Home Medications    Prior to Admission medications   Medication Sig Start Date End Date Taking? Authorizing Provider  albuterol (PROVENTIL) (2.5 MG/3ML) 0.083% nebulizer solution Take 3 mLs (2.5 mg total) by nebulization every 6 (six) hours as needed for wheezing or shortness of breath. 01/20/17  Yes Bethel BornGekas, Kelly Marie, PA-C  cetirizine-pseudoephedrine (ZYRTEC-D) 5-120 MG tablet Take 1 tablet by mouth daily. 05/06/17  Yes Yu, Amy V, PA-C  fluticasone (FLONASE) 50 MCG/ACT nasal spray Place 2 sprays into both nostrils daily. 04/26/17  Yes Yu, Amy V, PA-C  Fluticasone-Salmeterol (ADVAIR) 250-50 MCG/DOSE AEPB Inhale 1 puff into the lungs 2 (two) times daily. Please instruct in usage 02/15/17  Yes Georgetta HaberBurky, [name], NP  ipratropium (ATROVENT HFA) 17 MCG/ACT inhaler Inhale 2 puffs into the lungs every 6 (six) hours as needed for wheezing. 10/11/16  Yes Bethel BornGekas, Kelly Marie, PA-C  ipratropium (ATROVENT) 0.06 % nasal spray Place 2 sprays into both nostrils  4 (four) times daily. 05/06/17  Yes Yu, Amy V, PA-C  benzonatate (TESSALON) 100 MG capsule Take 1 capsule (100 mg total) by mouth every 8 (eight) hours. 06/09/17   Kyann Heydt C, PA-C  predniSONE (DELTASONE) 50 MG tablet Take 1 tablet (50 mg total) by mouth daily for 5 days. 06/09/17 06/14/17  Peggy Loge, Junius CreamerHallie C, PA-C    Family History History reviewed. No pertinent family history.  Social History Social History   Tobacco Use  . Smoking status: Former Smoker    Last attempt to quit: 1998    Years since quitting: 21.1  . Smokeless tobacco: Never Used  Substance Use Topics  . Alcohol use: No  . Drug use: No     Allergies   Patient has no known allergies.   Review of Systems Review of Systems  Constitutional: Negative for chills, fatigue and fever.  HENT: Positive for congestion. Negative for ear pain, rhinorrhea, sinus pressure, sore throat and trouble swallowing.   Respiratory: Positive for cough, chest tightness, shortness of breath and wheezing.   Cardiovascular: Negative for chest pain.  Gastrointestinal: Negative for abdominal pain, nausea and vomiting.  Musculoskeletal: Negative for myalgias.  Skin: Negative for rash.  Neurological: Negative for dizziness, light-headedness and headaches.     Physical Exam Triage Vital Signs ED Triage Vitals [06/09/17 1848]  Enc Vitals Group     BP 128/79     Pulse  Rate 82     Resp      Temp 98 F (36.7 C)     Temp Source Oral     SpO2 100 %     Weight      Height      Head Circumference      Peak Flow      Pain Score      Pain Loc      Pain Edu?      Excl. in GC?    No data found.  Updated Vital Signs BP 128/79 (BP Location: Left Arm)   Pulse 82   Temp 98 F (36.7 C) (Oral)   SpO2 100%   Visual Acuity Right Eye Distance:   Left Eye Distance:   Bilateral Distance:    Right Eye Near:   Left Eye Near:    Bilateral Near:     Physical Exam  Constitutional: She appears well-developed and well-nourished. No  distress.  HENT:  Head: Normocephalic and atraumatic.  Bilateral TMs nonerythematous, nasal mucosa minimally erythematous, no rhinorrhea.  Posterior oropharynx mildly erythematous, no tonsillar enlargement or exudate.  Eyes: Conjunctivae are normal.  Neck: Neck supple.  Cardiovascular: Normal rate and regular rhythm.  No murmur heard. Pulmonary/Chest: Effort normal and breath sounds normal. No respiratory distress.  Audible wheezing prior to auscultation, patient is not struggling to breathe or tachypneic.  Diffuse faint inspiratory wheezing, multiple prominent expiratory wheezes in bilateral lung fields.  Wheezing moderately improved after breathing treatment, still present.  Abdominal: Soft. There is no tenderness.  Musculoskeletal: She exhibits no edema.  Neurological: She is alert.  Skin: Skin is warm and dry.  Psychiatric: She has a normal mood and affect.  Nursing note and vitals reviewed.    UC Treatments / Results  Labs (all labs ordered are listed, but only abnormal results are displayed) Labs Reviewed - No data to display  EKG  EKG Interpretation None       Radiology No results found.  Procedures Procedures (including critical care time)  Medications Ordered in UC Medications  ipratropium-albuterol (DUONEB) 0.5-2.5 (3) MG/3ML nebulizer solution 3 mL (3 mLs Nebulization Given 06/09/17 1913)     Initial Impression / Assessment and Plan / UC Course  I have reviewed the triage vital signs and the nursing notes.  Pertinent labs & imaging results that were available during my care of the patient were reviewed by me and considered in my medical decision making (see chart for details).     Patient with symptoms from asthma exacerbation.  Patient feels relatively improved after DuoNeb.  States she does have a nebulizer machine at home, will provide albuterol nebulizers.  Went ahead and provided patient with a course of azithromycin given recent prednisone course  without much relief.  Instead of doing a taper we will do prednisone 50 for 5 days.  Patient may benefit from daily maintenance therapy given symptoms persistent.  Advised to follow-up with PCP to discuss if this is appropriate for her. Discussed strict return precautions. Patient verbalized understanding and is agreeable with plan.   Final Clinical Impressions(s) / UC Diagnoses   Final diagnoses:  Mild intermittent asthma with exacerbation    ED Discharge Orders        Ordered    predniSONE (DELTASONE) 50 MG tablet  Daily     06/09/17 1915    benzonatate (TESSALON) 100 MG capsule  Every 8 hours     06/09/17 1915       Controlled Substance  Prescriptions [place] Controlled Substance Registry consulted? Not Applicable   Lew Dawes, [place] 06/09/17 1954

## 2017-06-09 NOTE — ED Triage Notes (Signed)
Wheezing, per pt her asthma is acting up, per pt she's been having to use her inhaler more then usual

## 2017-06-09 NOTE — Discharge Instructions (Signed)
Please continue to use albuterol inhaler as needed.  Please take prednisone daily for the next 5 days.  Please also take the course of azithromycin.  Please follow-up with your primary provider to discuss need for any further inhalers/daily maintenance therapy.  Please take daily allergy pill like Zyrtec or Claritin, pair this with an oral decongestant like Mucinex or Sudafed to help with congestion.

## 2017-06-20 ENCOUNTER — Encounter (HOSPITAL_COMMUNITY): Payer: Self-pay | Admitting: Emergency Medicine

## 2017-06-20 ENCOUNTER — Ambulatory Visit (HOSPITAL_COMMUNITY)
Admission: EM | Admit: 2017-06-20 | Discharge: 2017-06-20 | Disposition: A | Discharge status: Home / Self Care | Payer: Medicaid Other | Attending: Family Medicine | Admitting: Family Medicine

## 2017-06-20 DIAGNOSIS — J4531 Mild persistent asthma with (acute) exacerbation: Secondary | ICD-10-CM

## 2017-06-20 MED ORDER — FLUTICASONE-SALMETEROL 250-50 MCG/DOSE IN AEPB: 1.0000 | INHALATION_SPRAY | Freq: Two times a day (BID) | RESPIRATORY_TRACT | 0 refills | Status: DC

## 2017-06-20 MED ORDER — PREDNISONE 50 MG PO TABS: 50.0000 mg | ORAL_TABLET | Freq: Every day | ORAL | 0 refills | Status: AC

## 2017-06-20 MED ORDER — IPRATROPIUM-ALBUTEROL 0.5-2.5 (3) MG/3ML IN SOLN
3.0000 mL | Freq: Once | RESPIRATORY_TRACT | Status: AC
  Administered 2017-06-20: 3 mL via RESPIRATORY_TRACT

## 2017-06-20 MED ORDER — IPRATROPIUM-ALBUTEROL 0.5-2.5 (3) MG/3ML IN SOLN
RESPIRATORY_TRACT | Status: AC
  Filled 2017-06-20: qty 3

## 2017-06-20 NOTE — Discharge Instructions (Signed)
Start prednisone as directed.  Restart Advair as directed.  Albuterol as needed for shortness of breath and wheezing.  As discussed, please follow-up with your primary care doctor to see if you need to be on maintenance medicine for long-term given recurrent exacerbations.  Please follow-up for reevaluation of you develop develop fever, chest pain, shortness of breath, coughing up phlegm.

## 2017-06-20 NOTE — ED Triage Notes (Signed)
Pt here for asthma sx starting this am

## 2017-06-20 NOTE — ED Provider Notes (Addendum)
[facility]    CSN: [id number] Arrival date & time: 06/20/17  1318     History   Chief Complaint Chief Complaint  Patient presents with  . Asthma    HPI Wendy Morgan is a 42 y.o. female.   42 year old female with history of asthma comes in for a 1 day history of asthma exacerbation.  States has had nonproductive cough for the past 4 days without rhinorrhea, nasal congestion.  Denies fever, chills, night sweats.  Woke up this morning with increased wheezing that is not completely resolved with albuterol.  She was recently treated for asthma exacerbation 2 weeks ago, and finished course of prednisone as directed.  States symptoms completely resolved prior to current symptom onset.  She is also taken Mucinex for the symptoms without relief.  Former smoker.      Past Medical History:  Diagnosis Date  . Asthma   . Bronchiectasis with acute exacerbation (HCC)     There are no active problems to display for this patient.   History reviewed. No pertinent surgical history.  OB History    No data available       Home Medications    Prior to Admission medications   Medication Sig Start Date End Date Taking? Authorizing Provider  albuterol (PROVENTIL) (2.5 MG/3ML) 0.083% nebulizer solution Take 3 mLs (2.5 mg total) by nebulization every 6 (six) hours as needed for wheezing or shortness of breath. 01/20/17   Bethel BornGekas, Kelly Marie, PA-C  albuterol (PROVENTIL) (2.5 MG/3ML) 0.083% nebulizer solution Take 3 mLs (2.5 mg total) by nebulization every 6 (six) hours as needed for wheezing or shortness of breath. 06/09/17   Wieters, Hallie C, PA-C  azithromycin (ZITHROMAX) 250 MG tablet Take 1 tablet (250 mg total) by mouth daily. Take first 2 tablets together, then 1 every day until finished. 06/09/17   Wieters, Hallie C, PA-C  benzonatate (TESSALON) 100 MG capsule Take 1 capsule (100 mg total) by mouth every 8 (eight) hours. 06/09/17   Wieters, Hallie C, PA-C    cetirizine-pseudoephedrine (ZYRTEC-D) 5-120 MG tablet Take 1 tablet by mouth daily. 05/06/17   Cathie HoopsYu, Amy V, PA-C  fluticasone (FLONASE) 50 MCG/ACT nasal spray Place 2 sprays into both nostrils daily. 04/26/17   Cathie HoopsYu, Amy V, PA-C  Fluticasone-Salmeterol (ADVAIR) 250-50 MCG/DOSE AEPB Inhale 1 puff into the lungs 2 (two) times daily. Please instruct in usage 06/20/17   Belinda FisherYu, Amy V, PA-C  ipratropium (ATROVENT HFA) 17 MCG/ACT inhaler Inhale 2 puffs into the lungs every 6 (six) hours as needed for wheezing. 10/11/16   Bethel BornGekas, Kelly Marie, PA-C  ipratropium (ATROVENT) 0.06 % nasal spray Place 2 sprays into both nostrils 4 (four) times daily. 05/06/17   Cathie HoopsYu, Amy V, PA-C  predniSONE (DELTASONE) 50 MG tablet Take 1 tablet (50 mg total) by mouth daily for 4 days. 06/20/17 06/24/17  Belinda FisherYu, Amy V, PA-C    Family History History reviewed. No pertinent family history.  Social History Social History   Tobacco Use  . Smoking status: Former Smoker    Last attempt to quit: 1998    Years since quitting: 21.1  . Smokeless tobacco: Never Used  Substance Use Topics  . Alcohol use: No  . Drug use: No     Allergies   Patient has no known allergies.   Review of Systems Review of Systems  Reason unable to perform ROS: See HPI as above.     Physical Exam Triage Vital Signs ED Triage Vitals [06/20/17 1340]  Enc Vitals Group     BP 109/83     Pulse Rate 86     Resp 18     Temp 98.5 F (36.9 C)     Temp Source Oral     SpO2 98 %     Weight      Height      Head Circumference      Peak Flow      Pain Score      Pain Loc      Pain Edu?      Excl. in GC?    No data found.  Updated Vital Signs BP 109/83 (BP Location: Left Arm)   Pulse 86   Temp 98.5 F (36.9 C) (Oral)   Resp 18   SpO2 98%   Physical Exam  Constitutional: She is oriented to person, place, and time. She appears well-developed and well-nourished. No distress.  HENT:  Head: Normocephalic and atraumatic.  Right Ear: Tympanic membrane,  external ear and ear canal normal. Tympanic membrane is not erythematous and not bulging.  Left Ear: Tympanic membrane, external ear and ear canal normal. Tympanic membrane is not erythematous and not bulging.  Nose: Nose normal. Right sinus exhibits no maxillary sinus tenderness and no frontal sinus tenderness. Left sinus exhibits no maxillary sinus tenderness and no frontal sinus tenderness.  Mouth/Throat: Uvula is midline, oropharynx is clear and moist and mucous membranes are normal.  Eyes: Conjunctivae are normal. Pupils are equal, round, and reactive to light.  Neck: Normal range of motion. Neck supple.  Cardiovascular: Normal rate, regular rhythm and normal heart sounds. Exam reveals no gallop and no friction rub.  No murmur heard. Pulmonary/Chest: Effort normal.  Coarse breath sounds throughout.  Inspiratory and expiratory wheezing in the periphery.  Lymphadenopathy:    She has no cervical adenopathy.  Neurological: She is alert and oriented to person, place, and time.  Skin: Skin is warm and dry.  Psychiatric: She has a normal mood and affect. Her behavior is normal. Judgment normal.   UC Treatments / Results  Labs (all labs ordered are listed, but only abnormal results are displayed) Labs Reviewed - No data to display  EKG  EKG Interpretation None       Radiology No results found.  Procedures Procedures (including critical care time)  Medications Ordered in UC Medications  ipratropium-albuterol (DUONEB) 0.5-2.5 (3) MG/3ML nebulizer solution 3 mL (3 mLs Nebulization Given 06/20/17 1446)     Initial Impression / Assessment and Plan / UC Course  I have reviewed the triage vital signs and the nursing notes.  Pertinent labs & imaging results that were available during my care of the patient were reviewed by me and considered in my medical decision making (see chart for details).    Much improved air movement after DuoNeb, with resolution of coarse  breath sounds and  wheezing.  Patient has had multiple episodes of asthma exacerbation, stating with decreased number of exacerbation when she was on Advair.  Will have patient take prednisone for current exacerbation.  Advair refilled to start for maintenance treatment.  Continue albuterol as needed for shortness of breath and wheezing.  Long discussion of patient to follow-up with PCP for further management of asthma, and possible COPD given history of smoking.  Return precautions given.  Patient expresses understanding and agrees to plan.  Final Clinical Impressions(s) / UC Diagnoses   Final diagnoses:  Mild persistent asthma with exacerbation    ED Discharge Orders  Ordered    predniSONE (DELTASONE) 50 MG tablet  Daily     06/20/17 1506    Fluticasone-Salmeterol (ADVAIR) 250-50 MCG/DOSE AEPB  2 times daily     06/20/17 1506       Belinda Fisher, PA-C 06/20/17 1541    Belinda Fisher, PA-C 06/20/17 1541

## 2017-06-26 ENCOUNTER — Encounter (HOSPITAL_COMMUNITY): Payer: Self-pay | Admitting: Emergency Medicine

## 2017-06-26 ENCOUNTER — Other Ambulatory Visit: Payer: Self-pay

## 2017-06-26 ENCOUNTER — Emergency Department (HOSPITAL_COMMUNITY)
Admission: EM | Admit: 2017-06-26 | Discharge: 2017-06-26 | Disposition: A | Discharge status: Home / Self Care | Payer: Medicaid Other | Attending: Emergency Medicine | Admitting: Emergency Medicine

## 2017-06-26 DIAGNOSIS — Z87891 Personal history of nicotine dependence: Secondary | ICD-10-CM | POA: Insufficient documentation

## 2017-06-26 DIAGNOSIS — J4521 Mild intermittent asthma with (acute) exacerbation: Secondary | ICD-10-CM | POA: Insufficient documentation

## 2017-06-26 DIAGNOSIS — Z79899 Other long term (current) drug therapy: Secondary | ICD-10-CM | POA: Insufficient documentation

## 2017-06-26 DIAGNOSIS — R062 Wheezing: Secondary | ICD-10-CM | POA: Diagnosis present

## 2017-06-26 MED ORDER — FLUTICASONE-SALMETEROL 250-50 MCG/DOSE IN AEPB: 1.0000 | INHALATION_SPRAY | Freq: Two times a day (BID) | RESPIRATORY_TRACT | 1 refills | Status: DC

## 2017-06-26 MED ORDER — DM-GUAIFENESIN ER 30-600 MG PO TB12: 1.0000 | ORAL_TABLET | Freq: Two times a day (BID) | ORAL | 0 refills | Status: DC

## 2017-06-26 MED ORDER — PREDNISONE 10 MG (21) PO TBPK: ORAL_TABLET | Freq: Every day | ORAL | 0 refills | Status: DC

## 2017-06-26 MED ORDER — PREDNISONE 20 MG PO TABS
60.0000 mg | ORAL_TABLET | Freq: Once | ORAL | Status: AC
  Administered 2017-06-26: 60 mg via ORAL
  Filled 2017-06-26: qty 3

## 2017-06-26 MED ORDER — ALBUTEROL SULFATE HFA 108 (90 BASE) MCG/ACT IN AERS
2.0000 | INHALATION_SPRAY | Freq: Once | RESPIRATORY_TRACT | Status: AC
  Administered 2017-06-26: 2 via RESPIRATORY_TRACT
  Filled 2017-06-26: qty 6.7

## 2017-06-26 MED ORDER — ALBUTEROL SULFATE (2.5 MG/3ML) 0.083% IN NEBU
5.0000 mg | INHALATION_SOLUTION | Freq: Once | RESPIRATORY_TRACT | Status: DC
  Filled 2017-06-26: qty 6

## 2017-06-26 NOTE — ED Triage Notes (Addendum)
Pt reports that she can not get her asthma under control today,  Reports that she has been using her inhaler however she is now out of her medications.  Wheezing is audible but pt is able to speak in complete sentences.

## 2017-06-26 NOTE — Discharge Instructions (Signed)
Start taking Advair again, daily, twice a day.  Use inhaler every 4 hours as needed.  Take prednisone as prescribed until all gone.  Please follow-up with family doctor for recheck.  Return if worsening symptoms.

## 2017-06-26 NOTE — ED Provider Notes (Signed)
[facility] EMERGENCY DEPARTMENT Provider Note   CSN: [id number] Arrival date & time: 06/26/17  1843     History   Chief Complaint Chief Complaint  Patient presents with  . Asthma    HPI Wendy Morgan is a 42 y.o. female.  HPI Wendy Morgan is a 42 y.o. female with history of asthma and bronchiectasis presents to emergency department complaining of wheezing, cough, shortness of breath.  Patient states that she has been using her inhaler multiple times a day with no relief of her symptoms.  She denies any productive cough.  She denies any fever or chills.  She states "I just feel like there is junk, lungs."  She states "if I could just get this junk up I would feel better."  She states she ran out of her inhaler today.  She states she used to be on Advair but ran out and never had that refilled.  She reports recent similar exacerbation, was treated with 4 days of prednisone just 1 week ago.  She states that she improved some but symptoms got worse.  Past Medical History:  Diagnosis Date  . Asthma   . Bronchiectasis with acute exacerbation (HCC)     There are no active problems to display for this patient.   History reviewed. No pertinent surgical history.  OB History    No data available       Home Medications    Prior to Admission medications   Medication Sig Start Date End Date Taking? Authorizing Provider  albuterol (PROVENTIL) (2.5 MG/3ML) 0.083% nebulizer solution Take 3 mLs (2.5 mg total) by nebulization every 6 (six) hours as needed for wheezing or shortness of breath. 01/20/17   Bethel Born, PA-C  albuterol (PROVENTIL) (2.5 MG/3ML) 0.083% nebulizer solution Take 3 mLs (2.5 mg total) by nebulization every 6 (six) hours as needed for wheezing or shortness of breath. 06/09/17   Wieters, Hallie C, PA-C  azithromycin (ZITHROMAX) 250 MG tablet Take 1 tablet (250 mg total) by mouth daily. Take first 2 tablets together, then 1 every day until  finished. 06/09/17   Wieters, Hallie C, PA-C  benzonatate (TESSALON) 100 MG capsule Take 1 capsule (100 mg total) by mouth every 8 (eight) hours. 06/09/17   Wieters, Hallie C, PA-C  cetirizine-pseudoephedrine (ZYRTEC-D) 5-120 MG tablet Take 1 tablet by mouth daily. 05/06/17   Cathie Hoops, Amy V, PA-C  fluticasone (FLONASE) 50 MCG/ACT nasal spray Place 2 sprays into both nostrils daily. 04/26/17   Cathie Hoops, Amy V, PA-C  Fluticasone-Salmeterol (ADVAIR) 250-50 MCG/DOSE AEPB Inhale 1 puff into the lungs 2 (two) times daily. Please instruct in usage 06/20/17   Belinda Fisher, PA-C  ipratropium (ATROVENT HFA) 17 MCG/ACT inhaler Inhale 2 puffs into the lungs every 6 (six) hours as needed for wheezing. 10/11/16   Bethel Born, PA-C  ipratropium (ATROVENT) 0.06 % nasal spray Place 2 sprays into both nostrils 4 (four) times daily. 05/06/17   Belinda Fisher, PA-C    Family History No family history on file.  Social History Social History   Tobacco Use  . Smoking status: Former Smoker    Last attempt to quit: 1998    Years since quitting: 21.2  . Smokeless tobacco: Never Used  Substance Use Topics  . Alcohol use: No  . Drug use: No     Allergies   Patient has no known allergies.   Review of Systems Review of Systems  Constitutional: Negative for chills and fever.  HENT: Negative for congestion.   Respiratory: Positive for cough, shortness of breath and wheezing. Negative for chest tightness.   Cardiovascular: Negative for chest pain, palpitations and leg swelling.  Gastrointestinal: Negative for abdominal pain, diarrhea, nausea and vomiting.  Genitourinary: Negative for dysuria, flank pain, pelvic pain, vaginal bleeding, vaginal discharge and vaginal pain.  Musculoskeletal: Negative for arthralgias, myalgias, neck pain and neck stiffness.  Skin: Negative for rash.  Neurological: Negative for dizziness, weakness and headaches.  All other systems reviewed and are negative.    Physical Exam Updated Vital  Signs BP (!) 126/105 (BP Location: Right Arm)   Pulse 89   Temp 97.7 F (36.5 C) (Oral)   Resp 18   Ht 5\' 4"  (1.626 m)   Wt 92.5 kg (204 lb)   SpO2 97%   BMI 35.02 kg/m   Physical Exam  Constitutional: She appears well-developed and well-nourished. No distress.  HENT:  Head: Normocephalic.  Eyes: Conjunctivae are normal.  Neck: Neck supple.  Cardiovascular: Normal rate, regular rhythm and normal heart sounds.  Pulmonary/Chest: Effort normal. No respiratory distress. She has wheezes. She has no rales.  Inspiratory and expiratory wheezes bilaterally  Abdominal: Soft. Bowel sounds are normal. She exhibits no distension. There is no tenderness. There is no rebound.  Musculoskeletal: She exhibits no edema.  Neurological: She is alert.  Skin: Skin is warm and dry.  Psychiatric: She has a normal mood and affect. Her behavior is normal.  Nursing note and vitals reviewed.    ED Treatments / Results  Labs (all labs ordered are listed, but only abnormal results are displayed) Labs Reviewed - No data to display  EKG  EKG Interpretation None       Radiology No results found.  Procedures Procedures (including critical care time)  Medications Ordered in ED Medications  albuterol (PROVENTIL) (2.5 MG/3ML) 0.083% nebulizer solution 5 mg (not administered)  predniSONE (DELTASONE) tablet 60 mg (not administered)  albuterol (PROVENTIL HFA;VENTOLIN HFA) 108 (90 Base) MCG/ACT inhaler 2 puff (not administered)     Initial Impression / Assessment and Plan / ED Course  I have reviewed the triage vital signs and the nursing notes.  Pertinent labs & imaging results that were available during my care of the patient were reviewed by me and considered in my medical decision making (see chart for details).     Patient in emergency department with acute exacerbation of asthma.  Lungs clear other than inspiratory and expiratory wheezes.  Patient states she always wheezes.  She has had  multiple evaluations for asthma in emergency department in urgent care.  She states this feels the same.  She has had multiple prior chest x-rays, all normal.  Discussed possibility of getting chest x-ray, however patient stated that she did not think she needed one.  She states this feels exactly like her last asthma exacerbation.  She is currently not taking Advair that she has prescribed for her, will refill that.  I think that it would help to keep her asthma exacerbations down.  Will refill albuterol.  Longer prednisone taper.  Follow-up with PCP.  Vitals:   06/26/17 1922 06/26/17 1923  BP: (!) 126/105   Pulse: 89   Resp: 18   Temp: 97.7 F (36.5 C)   TempSrc: Oral   SpO2: 97%   Weight:  92.5 kg (204 lb)  Height:  5\' 4"  (1.626 m)     Final Clinical Impressions(s) / ED Diagnoses   Final diagnoses:  Mild intermittent asthma with  exacerbation    ED Discharge Orders        Ordered    Fluticasone-Salmeterol (ADVAIR DISKUS) 250-50 MCG/DOSE AEPB  2 times daily     06/26/17 2006    predniSONE (STERAPRED UNI-PAK 21 TAB) 10 MG (21) TBPK tablet  Daily     06/26/17 2006    dextromethorphan-guaiFENesin (MUCINEX DM) 30-600 MG 12hr tablet  2 times daily     06/26/17 2006       Jaynie CrumbleKirichenko, Fathima Bartl, [place] 06/26/17 2011    Tegeler, Canary Brimhristopher J, MD 06/27/17 [id number]

## 2017-09-03 ENCOUNTER — Emergency Department (HOSPITAL_COMMUNITY): Payer: Self-pay

## 2017-09-03 ENCOUNTER — Emergency Department (HOSPITAL_COMMUNITY)
Admission: EM | Admit: 2017-09-03 | Discharge: 2017-09-03 | Disposition: A | Discharge status: Home / Self Care | Payer: Self-pay | Attending: Emergency Medicine | Admitting: Emergency Medicine

## 2017-09-03 ENCOUNTER — Encounter (HOSPITAL_COMMUNITY): Payer: Self-pay | Admitting: *Deleted

## 2017-09-03 ENCOUNTER — Other Ambulatory Visit: Payer: Self-pay

## 2017-09-03 DIAGNOSIS — Z79899 Other long term (current) drug therapy: Secondary | ICD-10-CM | POA: Insufficient documentation

## 2017-09-03 DIAGNOSIS — Z87891 Personal history of nicotine dependence: Secondary | ICD-10-CM | POA: Insufficient documentation

## 2017-09-03 DIAGNOSIS — J45901 Unspecified asthma with (acute) exacerbation: Secondary | ICD-10-CM | POA: Insufficient documentation

## 2017-09-03 MED ORDER — IPRATROPIUM-ALBUTEROL 0.5-2.5 (3) MG/3ML IN SOLN
3.0000 mL | Freq: Once | RESPIRATORY_TRACT | Status: AC
  Administered 2017-09-03: 3 mL via RESPIRATORY_TRACT
  Filled 2017-09-03: qty 3

## 2017-09-03 MED ORDER — PREDNISONE 20 MG PO TABS
60.0000 mg | ORAL_TABLET | Freq: Once | ORAL | Status: AC
  Administered 2017-09-03: 60 mg via ORAL
  Filled 2017-09-03: qty 3

## 2017-09-03 MED ORDER — PREDNISONE 50 MG PO TABS: 50.0000 mg | ORAL_TABLET | Freq: Every day | ORAL | 0 refills | Status: AC

## 2017-09-03 MED ORDER — ALBUTEROL SULFATE (2.5 MG/3ML) 0.083% IN NEBU
5.0000 mg | INHALATION_SOLUTION | Freq: Once | RESPIRATORY_TRACT | Status: AC
  Administered 2017-09-03: 5 mg via RESPIRATORY_TRACT

## 2017-09-03 MED ORDER — IPRATROPIUM-ALBUTEROL 0.5-2.5 (3) MG/3ML IN SOLN
RESPIRATORY_TRACT | Status: AC
  Filled 2017-09-03: qty 3

## 2017-09-03 NOTE — ED Notes (Signed)
Patient transported to X-ray 

## 2017-09-03 NOTE — ED Triage Notes (Signed)
To ED for eval of wheezing and cough since past few days. Worse yesterday. Taking inhalers and neb tx at home without relief. Speaking in short sentences.

## 2017-09-03 NOTE — ED Provider Notes (Signed)
[facility] EMERGENCY DEPARTMENT Provider Note   CSN: [id number] Arrival date & time: 09/03/17  0940     History   Chief Complaint Chief Complaint  Patient presents with  . Asthma    HPI Patient is a 42 year old female with history of asthma who presents with concerns for asthma exacerbation.  Patient reports 3 days of nonproductive cough, runny nose, chest tightness, and shortness of breath.  She reports that symptoms feel similar to previous asthma exacerbations.  She has been taking her albuterol every 2-3 hours without improvement in symptoms.  She denies any fevers or chills.  Past Medical History:  Diagnosis Date  . Asthma   . Bronchiectasis with acute exacerbation (HCC)     There are no active problems to display for this patient.   History reviewed. No pertinent surgical history.   OB History   None      Home Medications    Prior to Admission medications   Medication Sig Start Date End Date Taking? Authorizing Provider  albuterol (PROVENTIL) (2.5 MG/3ML) 0.083% nebulizer solution Take 3 mLs (2.5 mg total) by nebulization every 6 (six) hours as needed for wheezing or shortness of breath. 01/20/17   Bethel Born, PA-C  albuterol (PROVENTIL) (2.5 MG/3ML) 0.083% nebulizer solution Take 3 mLs (2.5 mg total) by nebulization every 6 (six) hours as needed for wheezing or shortness of breath. 06/09/17   Wieters, Hallie C, PA-C  azithromycin (ZITHROMAX) 250 MG tablet Take 1 tablet (250 mg total) by mouth daily. Take first 2 tablets together, then 1 every day until finished. 06/09/17   Wieters, Hallie C, PA-C  benzonatate (TESSALON) 100 MG capsule Take 1 capsule (100 mg total) by mouth every 8 (eight) hours. 06/09/17   Wieters, Hallie C, PA-C  cetirizine-pseudoephedrine (ZYRTEC-D) 5-120 MG tablet Take 1 tablet by mouth daily. 05/06/17   Belinda Fisher, PA-C  dextromethorphan-guaiFENesin (MUCINEX DM) 30-600 MG 12hr tablet Take 1 tablet by mouth 2 (two) times  daily. 06/26/17   Kirichenko, Tatyana, PA-C  fluticasone (FLONASE) 50 MCG/ACT nasal spray Place 2 sprays into both nostrils daily. 04/26/17   Cathie Hoops, Amy V, PA-C  Fluticasone-Salmeterol (ADVAIR DISKUS) 250-50 MCG/DOSE AEPB Inhale 1 puff into the lungs 2 (two) times daily. 06/26/17   Kirichenko, Lemont Fillers, PA-C  ipratropium (ATROVENT HFA) 17 MCG/ACT inhaler Inhale 2 puffs into the lungs every 6 (six) hours as needed for wheezing. 10/11/16   Bethel Born, PA-C  ipratropium (ATROVENT) 0.06 % nasal spray Place 2 sprays into both nostrils 4 (four) times daily. 05/06/17   Cathie Hoops, Amy V, PA-C  predniSONE (DELTASONE) 50 MG tablet Take 1 tablet (50 mg total) by mouth daily with breakfast for 4 days. 09/03/17 09/07/17  Wynelle Cleveland, MD    Family History No family history on file.  Social History Social History   Tobacco Use  . Smoking status: Former Smoker    Last attempt to quit: 1998    Years since quitting: 21.3  . Smokeless tobacco: Never Used  Substance Use Topics  . Alcohol use: No  . Drug use: No     Allergies   Patient has no known allergies.   Review of Systems Review of Systems  Constitutional: Negative for chills and fever.  HENT: Positive for rhinorrhea. Negative for ear pain and sore throat.   Eyes: Negative for pain and visual disturbance.  Respiratory: Positive for cough, shortness of breath and wheezing.   Cardiovascular: Negative for chest pain and palpitations.  Gastrointestinal: Negative  for abdominal pain and vomiting.  Genitourinary: Negative for dysuria and hematuria.  Musculoskeletal: Negative for arthralgias and back pain.  Skin: Negative for color change and rash.  Neurological: Negative for seizures and syncope.  All other systems reviewed and are negative.    Physical Exam Updated Vital Signs BP 124/74 (BP Location: Left Arm)   Pulse 70   Temp 98.1 F (36.7 C) (Oral)   Resp 16   SpO2 100%   Physical Exam  Constitutional: She appears well-developed and  well-nourished. No distress.  HENT:  Head: Normocephalic and atraumatic.  Eyes: Conjunctivae are normal.  Neck: Neck supple.  Cardiovascular: Normal rate and regular rhythm.  No murmur heard. Pulmonary/Chest: Effort normal. No respiratory distress. She has wheezes. She has rhonchi.  Abdominal: Soft. There is no tenderness.  Musculoskeletal: She exhibits no edema.  Neurological: She is alert.  Skin: Skin is warm and dry.  Psychiatric: She has a normal mood and affect.  Nursing note and vitals reviewed.    ED Treatments / Results  Labs (all labs ordered are listed, but only abnormal results are displayed) Labs Reviewed - No data to display  EKG None  Radiology Dg Chest 2 View  Result Date: 09/03/2017 CLINICAL DATA:  Wheezing, shortness of breath and cough for a few days. EXAM: CHEST - 2 VIEW COMPARISON:  PA and lateral chest 06/03/2017 02/15/2014 FINDINGS: The lungs are clear. Heart size is normal. No pneumothorax or pleural effusion. No acute bony abnormality. IMPRESSION: No acute disease. Electronically Signed   By: Drusilla Kanner M.D.   On: 09/03/2017 10:46    Procedures Procedures (including critical care time)  Medications Ordered in ED Medications  albuterol (PROVENTIL) (2.5 MG/3ML) 0.083% nebulizer solution 5 mg (5 mg Nebulization Given 09/03/17 1024)  ipratropium-albuterol (DUONEB) 0.5-2.5 (3) MG/3ML nebulizer solution 3 mL (3 mLs Nebulization Given 09/03/17 1452)  predniSONE (DELTASONE) tablet 60 mg (60 mg Oral Given 09/03/17 1451)     Initial Impression / Assessment and Plan / ED Course  I have reviewed the triage vital signs and the nursing notes.  Pertinent labs & imaging results that were available during my care of the patient were reviewed by me and considered in my medical decision making (see chart for details).     Patient is a 42 year old female with history of asthma who presents with signs and symptoms of asthma exacerbation.  Patient arrived  hemodynamically stable, in no acute distress.  No accessory muscle use.  Diffuse expiratory wheezing bilaterally.  Patient treated with DuoNeb and prednisone.  Subjective improvement in shortness of breath, however similar physical examination with diffuse wheezing.  CXR negative. Given patient's stable vital signs and reassuring lung examination, she is safe for discharge home with short course of prednisone.  I offered patient additional albuterol, however she reports that she has plenty at home and does not need any additional prescriptions at this time.  Patient has follow-up appointment with her primary care physician tomorrow for reevaluation.  Return precautions discussed.  Patient and plan of care discussed with Attending physician, Dr. Rosalia Hammers.    Final Clinical Impressions(s) / ED Diagnoses   Final diagnoses:  Mild asthma with exacerbation, unspecified whether persistent    ED Discharge Orders        Ordered    predniSONE (DELTASONE) 50 MG tablet  Daily with breakfast     09/03/17 1514       Wynelle Cleveland, MD 09/03/17 1651    Margarita Grizzle, MD 09/04/17 [phone]

## 2017-09-12 ENCOUNTER — Emergency Department (HOSPITAL_COMMUNITY)
Admission: EM | Admit: 2017-09-12 | Discharge: 2017-09-12 | Disposition: A | Discharge status: Home / Self Care | Payer: Self-pay | Attending: Emergency Medicine | Admitting: Emergency Medicine

## 2017-09-12 ENCOUNTER — Encounter (HOSPITAL_COMMUNITY): Payer: Self-pay | Admitting: Emergency Medicine

## 2017-09-12 ENCOUNTER — Emergency Department (HOSPITAL_COMMUNITY): Payer: Self-pay

## 2017-09-12 ENCOUNTER — Other Ambulatory Visit: Payer: Self-pay

## 2017-09-12 DIAGNOSIS — Z79899 Other long term (current) drug therapy: Secondary | ICD-10-CM | POA: Insufficient documentation

## 2017-09-12 DIAGNOSIS — J4541 Moderate persistent asthma with (acute) exacerbation: Secondary | ICD-10-CM | POA: Insufficient documentation

## 2017-09-12 DIAGNOSIS — Z87891 Personal history of nicotine dependence: Secondary | ICD-10-CM | POA: Insufficient documentation

## 2017-09-12 MED ORDER — ALBUTEROL (5 MG/ML) CONTINUOUS INHALATION SOLN
10.0000 mg/h | INHALATION_SOLUTION | RESPIRATORY_TRACT | Status: DC
Start: 2017-09-12 — End: 2017-09-12
  Administered 2017-09-12: 10 mg/h via RESPIRATORY_TRACT
  Filled 2017-09-12: qty 20

## 2017-09-12 MED ORDER — ALBUTEROL SULFATE (2.5 MG/3ML) 0.083% IN NEBU
5.0000 mg | INHALATION_SOLUTION | Freq: Once | RESPIRATORY_TRACT | Status: AC
  Administered 2017-09-12: 5 mg via RESPIRATORY_TRACT
  Filled 2017-09-12: qty 6

## 2017-09-12 MED ORDER — IPRATROPIUM BROMIDE 0.02 % IN SOLN
1.0000 mg | Freq: Once | RESPIRATORY_TRACT | Status: AC
Start: 2017-09-12 — End: 2017-09-12
  Administered 2017-09-12: 1 mg via RESPIRATORY_TRACT
  Filled 2017-09-12: qty 5

## 2017-09-12 MED ORDER — PREDNISONE 20 MG PO TABS
60.0000 mg | ORAL_TABLET | Freq: Once | ORAL | Status: AC
  Administered 2017-09-12: 60 mg via ORAL
  Filled 2017-09-12: qty 3

## 2017-09-12 MED ORDER — IPRATROPIUM-ALBUTEROL 0.5-2.5 (3) MG/3ML IN SOLN
3.0000 mL | Freq: Once | RESPIRATORY_TRACT | Status: AC
  Administered 2017-09-12: 3 mL via RESPIRATORY_TRACT
  Filled 2017-09-12: qty 3

## 2017-09-12 MED ORDER — PREDNISONE 10 MG (21) PO TBPK: ORAL_TABLET | Freq: Every day | ORAL | 0 refills | Status: DC

## 2017-09-12 NOTE — Discharge Instructions (Addendum)
Please follow-up with one of the asthma centers below for further management of your asthma.  Take prednisone until completed.  Continue using your albuterol and Advair as prescribed.  Please return to the emergency department if you develop any new or worsening symptoms.

## 2017-09-12 NOTE — ED Notes (Addendum)
Pt ambulated with pulse ox O2 was at 97%. Pt ambulated with steady gait , tolerated well.

## 2017-09-12 NOTE — ED Provider Notes (Signed)
[facility] EMERGENCY DEPARTMENT Provider Note   CSN: [id number] Arrival date & time: 09/12/17  0056     History   Chief Complaint Chief Complaint  Patient presents with  . Shortness of Breath    HPI Wendy Morgan is a 42 y.o. female with history of asthma who presents with a one-week history of productive cough, shortness of breath, and wheezing.  Patient has been using her albuterol and Advair at home without relief.  She denies any fevers.  She has had some associated chest tightness.  After albuterol given in triage, patient is feeling a little bit better and her chest tightness and shortness of breath has improved, however she continues to wheeze and cough.  Patient denies any other symptoms including abdominal pain, nausea, vomiting.  Patient is followed by her PCP.  HPI  Past Medical History:  Diagnosis Date  . Asthma   . Bronchiectasis with acute exacerbation (HCC)     There are no active problems to display for this patient.   History reviewed. No pertinent surgical history.   OB History   None      Home Medications    Prior to Admission medications   Medication Sig Start Date End Date Taking? Authorizing Provider  albuterol (PROVENTIL HFA;VENTOLIN HFA) 108 (90 Base) MCG/ACT inhaler Inhale 2 puffs into the lungs every 4 (four) hours as needed for wheezing or shortness of breath.   Yes [provider]  albuterol (PROVENTIL) (2.5 MG/3ML) 0.083% nebulizer solution Take 3 mLs (2.5 mg total) by nebulization every 6 (six) hours as needed for wheezing or shortness of breath. 01/20/17  Yes Bethel Born, PA-C  Fluticasone-Salmeterol (ADVAIR DISKUS) 250-50 MCG/DOSE AEPB Inhale 1 puff into the lungs 2 (two) times daily. 06/26/17  Yes Kirichenko, Tatyana, PA-C  benzonatate (TESSALON) 100 MG capsule Take 1 capsule (100 mg total) by mouth every 8 (eight) hours. Patient not taking: Reported on 09/12/2017 06/09/17   Wieters, Hallie C, PA-C    cetirizine-pseudoephedrine (ZYRTEC-D) 5-120 MG tablet Take 1 tablet by mouth daily. Patient not taking: Reported on 09/12/2017 05/06/17   Belinda Fisher, PA-C  dextromethorphan-guaiFENesin [facility] DM) 30-600 MG 12hr tablet Take 1 tablet by mouth 2 (two) times daily. Patient not taking: Reported on 09/12/2017 06/26/17   Jaynie Crumble, PA-C  fluticasone (FLONASE) 50 MCG/ACT nasal spray Place 2 sprays into both nostrils daily. Patient not taking: Reported on 09/12/2017 04/26/17   Belinda Fisher, PA-C  ipratropium (ATROVENT HFA) 17 MCG/ACT inhaler Inhale 2 puffs into the lungs every 6 (six) hours as needed for wheezing. Patient not taking: Reported on 09/12/2017 10/11/16   Bethel Born, PA-C  ipratropium (ATROVENT) 0.06 % nasal spray Place 2 sprays into both nostrils 4 (four) times daily. Patient not taking: Reported on 09/12/2017 05/06/17   Belinda Fisher, PA-C  predniSONE (STERAPRED UNI-PAK 21 TAB) 10 MG (21) TBPK tablet Take by mouth daily. Take 6 tabs by mouth daily  for 2 days, then 5 tabs for 2 days, then 4 tabs for 2 days, then 3 tabs for 2 days, 2 tabs for 2 days, then 1 tab by mouth daily for 2 days 09/12/17   Emi Holes, PA-C    Family History No family history on file.  Social History Social History   Tobacco Use  . Smoking status: Former Smoker    Last attempt to quit: 1998    Years since quitting: 21.4  . Smokeless tobacco: Never Used  Substance Use  Topics  . Alcohol use: No  . Drug use: No     Allergies   Patient has no known allergies.   Review of Systems Review of Systems  Constitutional: Negative for chills and fever.  HENT: Negative for facial swelling and sore throat.   Respiratory: Positive for cough, chest tightness, shortness of breath and wheezing.   Cardiovascular: Negative for chest pain.  Gastrointestinal: Negative for abdominal pain, nausea and vomiting.  Genitourinary: Negative for dysuria.  Musculoskeletal: Negative for back pain.  Skin: Negative for  rash and wound.  Neurological: Negative for headaches.  Psychiatric/Behavioral: The patient is not nervous/anxious.      Physical Exam Updated Vital Signs BP 117/80 (BP Location: Right Arm)   Pulse 78   Temp 97.8 F (36.6 C) (Oral)   Resp 17   SpO2 95%   Physical Exam  Constitutional: She appears well-developed and well-nourished. No distress.  HENT:  Head: Normocephalic and atraumatic.  Mouth/Throat: Oropharynx is clear and moist. No oropharyngeal exudate.  Eyes: Pupils are equal, round, and reactive to light. Conjunctivae are normal. Right eye exhibits no discharge. Left eye exhibits no discharge. No scleral icterus.  Neck: Normal range of motion. Neck supple. No thyromegaly present.  Cardiovascular: Normal rate, regular rhythm, normal heart sounds and intact distal pulses. Exam reveals no gallop and no friction rub.  No murmur heard. Pulmonary/Chest: Effort normal. No stridor. No respiratory distress. She has wheezes (expiratory bilaterally). She has no rales.  Abdominal: Soft. Bowel sounds are normal. She exhibits no distension. There is no tenderness. There is no rebound and no guarding.  Musculoskeletal: She exhibits no edema.  Lymphadenopathy:    She has no cervical adenopathy.  Neurological: She is alert. Coordination normal.  Skin: Skin is warm and dry. No rash noted. She is not diaphoretic. No pallor.  Psychiatric: She has a normal mood and affect.  Nursing note and vitals reviewed.    ED Treatments / Results  Labs (all labs ordered are listed, but only abnormal results are displayed) Labs Reviewed - No data to display  EKG EKG Interpretation  Date/Time:  Wednesday Sep 12 2017 01:07:42 EDT Ventricular Rate:  83 PR Interval:  182 QRS Duration: 86 QT Interval:  382 QTC Calculation: 448 R Axis:   53 Text Interpretation:  Normal sinus rhythm Low voltage QRS Borderline ECG Confirmed by Tilden Fossa [phone]) on 09/12/2017 10:12:21 AM   Radiology Dg Chest 2  View  Result Date: 09/12/2017 CLINICAL DATA:  Shortness of breath, cough x1 week EXAM: CHEST - 2 VIEW COMPARISON:  09/03/2017 FINDINGS: Lungs are clear.  No pleural effusion or pneumothorax. The heart is normal in size. Visualized osseous structures are within normal limits. IMPRESSION: Normal chest radiographs. Electronically Signed   By: Charline Bills M.D.   On: 09/12/2017 01:56    Procedures Procedures (including critical care time)  Medications Ordered in ED Medications  albuterol (PROVENTIL,VENTOLIN) solution continuous neb (0 mg/hr Nebulization Stopped 09/12/17 1419)  albuterol (PROVENTIL) (2.5 MG/3ML) 0.083% nebulizer solution 5 mg (5 mg Nebulization Given 09/12/17 0110)  ipratropium-albuterol (DUONEB) 0.5-2.5 (3) MG/3ML nebulizer solution 3 mL (3 mLs Nebulization Given 09/12/17 1053)  predniSONE (DELTASONE) tablet 60 mg (60 mg Oral Given 09/12/17 1053)  ipratropium (ATROVENT) nebulizer solution 1 mg (1 mg Nebulization Given 09/12/17 1201)     Initial Impression / Assessment and Plan / ED Course  I have reviewed the triage vital signs and the nursing notes.  Pertinent labs & imaging results that were available during  my care of the patient were reviewed by me and considered in my medical decision making (see chart for details).  Clinical Course as of Sep 13 1522  Wed Sep 12, 2017  1150 Patient continues to have bilateral expiratory wheezing after DuoNeb, but does feel little better.  Will proceed with a continuous albuterol with 1 mg Atrovent.   [AL]  1315 After continuous albuterol with Atrovent, patient states she is feeling much better.  She is breathing at baseline.  Patient continues to have some expiratory wheezing and rhonchi.  Patient states she normally lives with some wheezing but the steroids to really improve it.  Will check pulse ox while ambulating and determine dispo.   [AL]    Clinical Course User Index [AL] Emi Holes, PA-C    Patient will asthma  exacerbation not improved with steroid burst.  Chest x-ray is negative.  Lung exam mildly improved, however patient subjectively much improved. Patient felt improved after one albuterol neb, however is now breathing at baseline after DuoNeb and continuous nebulizer.  Patient ambulated with mild shortness of breath above 97%. Patient is ready to go home.  We will start prednisone taper.  Patient referred to asthma specialist considering the amount of visits for asthma exacerbation.  Patient has albuterol and Advair at home and is advised to continue as prescribed.  Return precautions discussed.  Patient understands and agrees with plan.  Patient vitals stable throughout ED course and discharged in satisfactory condition.  Final Clinical Impressions(s) / ED Diagnoses   Final diagnoses:  Moderate persistent asthma with exacerbation    ED Discharge Orders        Ordered    predniSONE (STERAPRED UNI-PAK 21 TAB) 10 MG (21) TBPK tablet  Daily     09/12/17 1447       Emi Holes, PA-C 09/12/17 1525    Tilden Fossa, MD 09/13/17 [phone]

## 2017-09-12 NOTE — ED Triage Notes (Signed)
Pt has a hx of asthma and began having increased shob today. States she was seen this week and given steroids but still got worse today.  Tried inhalers and neb at home with no relief. Wheezes all fields.

## 2017-09-27 ENCOUNTER — Other Ambulatory Visit: Payer: Self-pay

## 2017-09-27 ENCOUNTER — Emergency Department (HOSPITAL_COMMUNITY)
Admission: EM | Admit: 2017-09-27 | Discharge: 2017-09-27 | Disposition: A | Discharge status: Home / Self Care | Payer: Self-pay | Attending: Emergency Medicine | Admitting: Emergency Medicine

## 2017-09-27 ENCOUNTER — Encounter (HOSPITAL_COMMUNITY): Payer: Self-pay | Admitting: *Deleted

## 2017-09-27 DIAGNOSIS — J4541 Moderate persistent asthma with (acute) exacerbation: Secondary | ICD-10-CM | POA: Insufficient documentation

## 2017-09-27 DIAGNOSIS — Z87891 Personal history of nicotine dependence: Secondary | ICD-10-CM | POA: Insufficient documentation

## 2017-09-27 DIAGNOSIS — Z79899 Other long term (current) drug therapy: Secondary | ICD-10-CM | POA: Insufficient documentation

## 2017-09-27 MED ORDER — ALBUTEROL SULFATE HFA 108 (90 BASE) MCG/ACT IN AERS: 1.0000 | INHALATION_SPRAY | Freq: Four times a day (QID) | RESPIRATORY_TRACT | 0 refills | Status: DC | PRN

## 2017-09-27 MED ORDER — ALBUTEROL SULFATE (2.5 MG/3ML) 0.083% IN NEBU
5.0000 mg | INHALATION_SOLUTION | Freq: Once | RESPIRATORY_TRACT | Status: AC
  Administered 2017-09-27: 5 mg via RESPIRATORY_TRACT
  Filled 2017-09-27: qty 6

## 2017-09-27 MED ORDER — ALBUTEROL SULFATE HFA 108 (90 BASE) MCG/ACT IN AERS
INHALATION_SPRAY | RESPIRATORY_TRACT | Status: AC
  Filled 2017-09-27: qty 6.7

## 2017-09-27 MED ORDER — PREDNISONE 50 MG PO TABS: ORAL_TABLET | ORAL | 0 refills | Status: DC

## 2017-09-27 MED ORDER — ALBUTEROL SULFATE HFA 108 (90 BASE) MCG/ACT IN AERS
2.0000 | INHALATION_SPRAY | Freq: Once | RESPIRATORY_TRACT | Status: AC
  Administered 2017-09-27: 2 via RESPIRATORY_TRACT

## 2017-09-27 NOTE — ED Triage Notes (Signed)
Pt c/o sob, hx of asthma, ran out of inhaler yesterday. Wheezing noted . Has productive cough with green sputum

## 2017-09-27 NOTE — ED Provider Notes (Signed)
[facility] EMERGENCY DEPARTMENT Provider Note   CSN: [id number] Arrival date & time: 09/27/17  0028     History   Chief Complaint Chief Complaint  Patient presents with  . Asthma    HPI Wendy Morgan is a 42 y.o. female.  The history is provided by the patient.  Asthma  This is a recurrent problem. The current episode started yesterday. The problem occurs constantly. The problem has been gradually improving. Associated symptoms include shortness of breath. Pertinent negatives include no chest pain and no abdominal pain. Nothing aggravates the symptoms. Nothing relieves the symptoms.  She reports she is just here for albuterol inhaler  Past Medical History:  Diagnosis Date  . Asthma   . Bronchiectasis with acute exacerbation (HCC)     There are no active problems to display for this patient.   History reviewed. No pertinent surgical history.   OB History   None      Home Medications    Prior to Admission medications   Medication Sig Start Date End Date Taking? Authorizing Provider  albuterol (PROVENTIL HFA;VENTOLIN HFA) 108 (90 Base) MCG/ACT inhaler Inhale 2 puffs into the lungs every 4 (four) hours as needed for wheezing or shortness of breath.    [provider]  albuterol (PROVENTIL HFA;VENTOLIN HFA) 108 (90 Base) MCG/ACT inhaler Inhale 1-2 puffs into the lungs every 6 (six) hours as needed for wheezing or shortness of breath. 09/27/17   Zadie Rhine, MD  fluticasone (FLONASE) 50 MCG/ACT nasal spray Place 2 sprays into both nostrils daily. Patient not taking: Reported on 09/12/2017 04/26/17   Belinda Fisher, PA-C  Fluticasone-Salmeterol (ADVAIR DISKUS) 250-50 MCG/DOSE AEPB Inhale 1 puff into the lungs 2 (two) times daily. 06/26/17   Kirichenko, Tatyana, PA-C  ipratropium (ATROVENT HFA) 17 MCG/ACT inhaler Inhale 2 puffs into the lungs every 6 (six) hours as needed for wheezing. Patient not taking: Reported on 09/12/2017 10/11/16   Bethel Born, PA-C  ipratropium (ATROVENT) 0.06 % nasal spray Place 2 sprays into both nostrils 4 (four) times daily. Patient not taking: Reported on 09/12/2017 05/06/17   Belinda Fisher, PA-C  predniSONE (DELTASONE) 50 MG tablet One tablet PO daily for 5 days 09/27/17   Zadie Rhine, MD    Family History No family history on file.  Social History Social History   Tobacco Use  . Smoking status: Former Smoker    Last attempt to quit: 1998    Years since quitting: 21.4  . Smokeless tobacco: Never Used  Substance Use Topics  . Alcohol use: No  . Drug use: No     Allergies   Patient has no known allergies.   Review of Systems Review of Systems  Respiratory: Positive for shortness of breath.   Cardiovascular: Negative for chest pain.  Gastrointestinal: Negative for abdominal pain.     Physical Exam Updated Vital Signs BP 116/87 (BP Location: Left Arm)   Pulse 79   Temp 98 F (36.7 C) (Oral)   Resp 16   SpO2 98%   Physical Exam CONSTITUTIONAL: Well developed/well nourished HEAD: Normocephalic/atraumatic EYES: EOMI ENMT: Mucous membranes moist NECK: supple no meningeal signs CV: S1/S2 noted, no murmurs/rubs/gallops noted LUNGS: Wheezing bilaterally, no acute distress ABDOMEN: soft NEURO: Pt is awake/alert/appropriate, moves all extremitiesx4.  SKIN: warm, color normal PSYCH: no abnormalities of mood noted, alert and oriented to situation    ED Treatments / Results  Labs (all labs ordered are listed, but only abnormal results are  displayed) Labs Reviewed - No data to display  EKG None  Radiology No results found.  Procedures Procedures (including critical care time)  Medications Ordered in ED Medications  albuterol (PROVENTIL) (2.5 MG/3ML) 0.083% nebulizer solution 5 mg (5 mg Nebulization Given 09/27/17 0036)  albuterol (PROVENTIL HFA;VENTOLIN HFA) 108 (90 Base) MCG/ACT inhaler 2 puff (2 puffs Inhalation Given 09/27/17 0245)     Initial Impression /  Assessment and Plan / ED Course  I have reviewed the triage vital signs and the nursing notes.      Requesting steroids and albuterol.  She is feeling improved.  No hypoxia   Vitals appropriate.  Will discharge  Final Clinical Impressions(s) / ED Diagnoses   Final diagnoses:  Moderate persistent asthma with exacerbation    ED Discharge Orders        Ordered    albuterol (PROVENTIL HFA;VENTOLIN HFA) 108 (90 Base) MCG/ACT inhaler  Every 6 hours PRN     09/27/17 0237    predniSONE (DELTASONE) 50 MG tablet     09/27/17 0237       Zadie RhineWickline, Gabriella Guile, MD 09/27/17 [id number]

## 2017-10-03 ENCOUNTER — Ambulatory Visit (HOSPITAL_COMMUNITY)
Admission: EM | Admit: 2017-10-03 | Discharge: 2017-10-03 | Disposition: A | Discharge status: Home / Self Care | Payer: Self-pay | Attending: Internal Medicine | Admitting: Internal Medicine

## 2017-10-03 ENCOUNTER — Encounter (HOSPITAL_COMMUNITY): Payer: Self-pay | Admitting: Family Medicine

## 2017-10-03 DIAGNOSIS — R062 Wheezing: Secondary | ICD-10-CM

## 2017-10-03 DIAGNOSIS — Z760 Encounter for issue of repeat prescription: Secondary | ICD-10-CM

## 2017-10-03 DIAGNOSIS — J45901 Unspecified asthma with (acute) exacerbation: Secondary | ICD-10-CM

## 2017-10-03 MED ORDER — ALBUTEROL SULFATE HFA 108 (90 BASE) MCG/ACT IN AERS
1.0000 | INHALATION_SPRAY | Freq: Four times a day (QID) | RESPIRATORY_TRACT | 5 refills | Status: DC | PRN
Start: 2017-10-03 — End: 2017-12-25

## 2017-10-03 NOTE — ED Triage Notes (Signed)
Pt with hx of asthma here with 4 days of cough, SOB, wheezing. She is out of her inhaler.

## 2017-10-03 NOTE — ED Provider Notes (Signed)
[facility]    CSN: [id number] Arrival date & time: 10/03/17  1842     History   Chief Complaint Chief Complaint  Patient presents with  . Cough  . Asthma    HPI Wendy Morgan is a 42 y.o. female.   Presents to the urgent care facility for evaluation of wheezing.  Patient states she has had wheezing for a few days with a dry cough.  She denies any fevers, productive cough, chest pain or shortness of breath.  She has been without her inhaler for a couple of weeks.  Typically her inhaler gives her good relief.  She denies any facial swelling, rashes.  No difficulty swallowing.  HPI  Past Medical History:  Diagnosis Date  . Asthma   . Bronchiectasis with acute exacerbation (HCC)     There are no active problems to display for this patient.   History reviewed. No pertinent surgical history.  OB History   None      Home Medications    Prior to Admission medications   Medication Sig Start Date End Date Taking? Authorizing Provider  albuterol (PROVENTIL HFA;VENTOLIN HFA) 108 (90 Base) MCG/ACT inhaler Inhale 1-2 puffs into the lungs every 6 (six) hours as needed for wheezing or shortness of breath. 10/03/17   Evon Slack, PA-C  fluticasone (FLONASE) 50 MCG/ACT nasal spray Place 2 sprays into both nostrils daily. Patient not taking: Reported on 09/12/2017 04/26/17   Belinda Fisher, PA-C  Fluticasone-Salmeterol (ADVAIR DISKUS) 250-50 MCG/DOSE AEPB Inhale 1 puff into the lungs 2 (two) times daily. 06/26/17   Kirichenko, Tatyana, PA-C  ipratropium (ATROVENT HFA) 17 MCG/ACT inhaler Inhale 2 puffs into the lungs every 6 (six) hours as needed for wheezing. Patient not taking: Reported on 09/12/2017 10/11/16   Bethel Born, PA-C  ipratropium (ATROVENT) 0.06 % nasal spray Place 2 sprays into both nostrils 4 (four) times daily. Patient not taking: Reported on 09/12/2017 05/06/17   Belinda Fisher, PA-C  predniSONE (DELTASONE) 50 MG tablet One tablet PO daily for 5 days 09/27/17    Zadie Rhine, MD    Family History History reviewed. No pertinent family history.  Social History Social History   Tobacco Use  . Smoking status: Former Smoker    Last attempt to quit: 1998    Years since quitting: 21.4  . Smokeless tobacco: Never Used  Substance Use Topics  . Alcohol use: No  . Drug use: No     Allergies   Patient has no known allergies.   Review of Systems Review of Systems  Constitutional: Negative for fever.  Respiratory: Positive for wheezing. Negative for shortness of breath.   Cardiovascular: Negative for chest pain.  Gastrointestinal: Negative for abdominal pain.  Genitourinary: Negative for difficulty urinating, dysuria and urgency.  Musculoskeletal: Negative for back pain and myalgias.  Skin: Negative for rash.  Neurological: Negative for dizziness and headaches.     Physical Exam Triage Vital Signs ED Triage Vitals  Enc Vitals Group     BP 10/03/17 1859 131/72     Pulse Rate 10/03/17 1859 83     Resp 10/03/17 1859 18     Temp 10/03/17 1859 98.3 F (36.8 C)     Temp src --      SpO2 10/03/17 1859 98 %     Weight --      Height --      Head Circumference --      Peak Flow --  Pain Score 10/03/17 1858 0     Pain Loc --      Pain Edu? --      Excl. in GC? --    No data found.  Updated Vital Signs BP 131/72   Pulse 83   Temp 98.3 F (36.8 C)   Resp 18   SpO2 98%   Visual Acuity Right Eye Distance:   Left Eye Distance:   Bilateral Distance:    Right Eye Near:   Left Eye Near:    Bilateral Near:     Physical Exam  Constitutional: She is oriented to person, place, and time. She appears well-developed and well-nourished.  HENT:  Head: Normocephalic and atraumatic.  Mouth/Throat: Oropharynx is clear and moist. No oropharyngeal exudate.  Eyes: Conjunctivae are normal.  Neck: Normal range of motion.  Cardiovascular: Normal rate.  Pulmonary/Chest: Effort normal. No respiratory distress. She has wheezes (Mild  expiratory wheezing bilaterally).  Abdominal: Soft. She exhibits no distension. There is no tenderness.  Musculoskeletal: Normal range of motion.  Neurological: She is alert and oriented to person, place, and time.  Skin: Skin is warm. No rash noted.  Psychiatric: She has a normal mood and affect. Her behavior is normal. Thought content normal.     UC Treatments / Results  Labs (all labs ordered are listed, but only abnormal results are displayed) Labs Reviewed - No data to display  EKG None  Radiology No results found.  Procedures Procedures (including critical care time)  Medications Ordered in UC Medications - No data to display  Initial Impression / Assessment and Plan / UC Course  I have reviewed the triage vital signs and the nursing notes.  Pertinent labs & imaging results that were available during my care of the patient were reviewed by me and considered in my medical decision making (see chart for details).     Patient is given a prescription for inhaler.  She will use as needed.  She is educated on signs and symptoms to return to the clinic for.  Vital signs are stable Final Clinical Impressions(s) / UC Diagnoses   Final diagnoses:  Mild asthma with exacerbation, unspecified whether persistent  Medication refill   Discharge Instructions   None    ED Prescriptions    Medication Sig Dispense Auth. Provider   albuterol (PROVENTIL HFA;VENTOLIN HFA) 108 (90 Base) MCG/ACT inhaler Inhale 1-2 puffs into the lungs every 6 (six) hours as needed for wheezing or shortness of breath. 1 Inhaler Ronnette JuniperGaines, Jozalyn Baglio C, PA-C       Evon SlackGaines, Denis Carreon C, [place]PA-C 10/03/17 2001

## 2017-10-03 NOTE — ED Notes (Signed)
Pt discharged by provider.

## 2017-10-09 ENCOUNTER — Encounter (HOSPITAL_COMMUNITY): Payer: Self-pay | Admitting: Emergency Medicine

## 2017-10-09 ENCOUNTER — Ambulatory Visit (HOSPITAL_COMMUNITY)
Admission: EM | Admit: 2017-10-09 | Discharge: 2017-10-09 | Disposition: A | Discharge status: Home / Self Care | Payer: Self-pay | Attending: Family Medicine | Admitting: Family Medicine

## 2017-10-09 ENCOUNTER — Other Ambulatory Visit: Payer: Self-pay

## 2017-10-09 DIAGNOSIS — J40 Bronchitis, not specified as acute or chronic: Secondary | ICD-10-CM

## 2017-10-09 DIAGNOSIS — J45901 Unspecified asthma with (acute) exacerbation: Secondary | ICD-10-CM

## 2017-10-09 MED ORDER — IPRATROPIUM-ALBUTEROL 0.5-2.5 (3) MG/3ML IN SOLN
RESPIRATORY_TRACT | Status: AC
  Filled 2017-10-09: qty 3

## 2017-10-09 MED ORDER — AZITHROMYCIN 250 MG PO TABS: ORAL_TABLET | ORAL | 0 refills | Status: DC

## 2017-10-09 MED ORDER — IPRATROPIUM-ALBUTEROL 0.5-2.5 (3) MG/3ML IN SOLN
3.0000 mL | Freq: Once | RESPIRATORY_TRACT | Status: AC
  Administered 2017-10-09: 3 mL via RESPIRATORY_TRACT

## 2017-10-09 MED ORDER — PREDNISONE 10 MG PO TABS: 10.0000 mg | ORAL_TABLET | Freq: Every day | ORAL | 0 refills | Status: DC

## 2017-10-09 NOTE — ED Triage Notes (Signed)
History of asthma.  Asthma exacerbation started this week.  Symptoms include wheezing, cough, chest congestion and headache.  Audible wheezing and speaking in complete sentences.

## 2017-10-09 NOTE — Discharge Instructions (Addendum)
Continue all albuterol inhaler as before

## 2017-10-09 NOTE — ED Provider Notes (Signed)
[facility]    CSN: [id number] Arrival date & time: 10/09/17  1734     History   Chief Complaint Chief Complaint  Patient presents with  . Asthma    HPI Wendy Morgan is a 42 y.o. female.   Patient has a history of asthma for the past 2 days has had some cough and increased wheezing.  She has albuterol inhaler that has not helped.  Cough is productive of green sputum.  She denies any sinus congestion or drainage although she does complain of headache.  HPI  Past Medical History:  Diagnosis Date  . Asthma   . Bronchiectasis with acute exacerbation (HCC)     There are no active problems to display for this patient.   History reviewed. No pertinent surgical history.  OB History   None      Home Medications    Prior to Admission medications   Medication Sig Start Date End Date Taking? Authorizing Provider  albuterol (PROVENTIL HFA;VENTOLIN HFA) 108 (90 Base) MCG/ACT inhaler Inhale 1-2 puffs into the lungs every 6 (six) hours as needed for wheezing or shortness of breath. 10/03/17  Yes Evon SlackGaines, Thomas C, PA-C  fluticasone (FLONASE) 50 MCG/ACT nasal spray Place 2 sprays into both nostrils daily. Patient not taking: Reported on 09/12/2017 04/26/17   Belinda FisherYu, Amy V, PA-C  Fluticasone-Salmeterol (ADVAIR DISKUS) 250-50 MCG/DOSE AEPB Inhale 1 puff into the lungs 2 (two) times daily. 06/26/17   Kirichenko, Tatyana, PA-C  ipratropium (ATROVENT HFA) 17 MCG/ACT inhaler Inhale 2 puffs into the lungs every 6 (six) hours as needed for wheezing. Patient not taking: Reported on 09/12/2017 10/11/16   Bethel BornGekas, Kelly Marie, PA-C  ipratropium (ATROVENT) 0.06 % nasal spray Place 2 sprays into both nostrils 4 (four) times daily. Patient not taking: Reported on 09/12/2017 05/06/17   Belinda FisherYu, Amy V, PA-C  predniSONE (DELTASONE) 50 MG tablet One tablet PO daily for 5 days 09/27/17   Zadie RhineWickline, Donald, MD    Family History History reviewed. No pertinent family history.  Social History Social  History   Tobacco Use  . Smoking status: Former Smoker    Last attempt to quit: 1998    Years since quitting: 21.4  . Smokeless tobacco: Never Used  Substance Use Topics  . Alcohol use: No  . Drug use: No     Allergies   Patient has no known allergies.   Review of Systems Review of Systems  Constitutional: Negative.   HENT: Positive for congestion.   Respiratory: Positive for cough and wheezing.   Cardiovascular: Negative.      Physical Exam Triage Vital Signs ED Triage Vitals  Enc Vitals Group     BP 10/09/17 1803 127/82     Pulse Rate 10/09/17 1803 92     Resp 10/09/17 1803 (!) 24     Temp 10/09/17 1803 98.1 F (36.7 C)     Temp Source 10/09/17 1803 Oral     SpO2 10/09/17 1803 98 %     Weight --      Height --      Head Circumference --      Peak Flow --      Pain Score 10/09/17 1800 10     Pain Loc --      Pain Edu? --      Excl. in GC? --    No data found.  Updated Vital Signs BP 127/82 (BP Location: Left Arm)   Pulse 92   Temp 98.1 F (36.7  C) (Oral)   Resp (!) 24   SpO2 98%   Visual Acuity Right Eye Distance:   Left Eye Distance:   Bilateral Distance:    Right Eye Near:   Left Eye Near:    Bilateral Near:     Physical Exam  Constitutional: She is oriented to person, place, and time. She appears well-developed and well-nourished.  HENT:  Head: Normocephalic.  Cardiovascular: Normal rate and regular rhythm.  Pulmonary/Chest: Effort normal. She has wheezes.  Abdominal: Soft.  Neurological: She is alert and oriented to person, place, and time.     UC Treatments / Results  Labs (all labs ordered are listed, but only abnormal results are displayed) Labs Reviewed - No data to display  EKG None  Radiology No results found.  Procedures Procedures (including critical care time)  Medications Ordered in UC Medications  ipratropium-albuterol (DUONEB) 0.5-2.5 (3) MG/3ML nebulizer solution 3 mL (has no administration in time range)     Initial Impression / Assessment and Plan / UC Course  I have reviewed the triage vital signs and the nursing notes.  Pertinent labs & imaging results that were available during my care of the patient were reviewed by me and considered in my medical decision making (see chart for details).     Acute exacerbation of asthma secondary to bronchitis.  There was little response to the DuoNeb treatment.  I will treat her with steroids and azithromycin.  Continue  albuterol as before Final Clinical Impressions(s) / UC Diagnoses   Final diagnoses:  None   Discharge Instructions   None    ED Prescriptions    None     Controlled Substance Prescriptions [place] Controlled Substance Registry consulted? No   Frederica Kuster, MD 10/09/17 [phone]

## 2017-10-17 ENCOUNTER — Other Ambulatory Visit: Payer: Self-pay

## 2017-10-17 ENCOUNTER — Ambulatory Visit (HOSPITAL_COMMUNITY)
Admission: EM | Admit: 2017-10-17 | Discharge: 2017-10-17 | Disposition: A | Discharge status: Home / Self Care | Payer: Self-pay | Attending: Internal Medicine | Admitting: Internal Medicine

## 2017-10-17 ENCOUNTER — Encounter (HOSPITAL_COMMUNITY): Payer: Self-pay | Admitting: Emergency Medicine

## 2017-10-17 DIAGNOSIS — J4541 Moderate persistent asthma with (acute) exacerbation: Secondary | ICD-10-CM

## 2017-10-17 DIAGNOSIS — R062 Wheezing: Secondary | ICD-10-CM

## 2017-10-17 MED ORDER — BECLOMETHASONE DIPROP HFA 40 MCG/ACT IN AERB: 1.0000 | INHALATION_SPRAY | Freq: Two times a day (BID) | RESPIRATORY_TRACT | 0 refills | Status: DC

## 2017-10-17 MED ORDER — IPRATROPIUM-ALBUTEROL 0.5-2.5 (3) MG/3ML IN SOLN
RESPIRATORY_TRACT | Status: AC
  Filled 2017-10-17: qty 3

## 2017-10-17 MED ORDER — ALBUTEROL SULFATE HFA 108 (90 BASE) MCG/ACT IN AERS
INHALATION_SPRAY | RESPIRATORY_TRACT | Status: AC
  Filled 2017-10-17: qty 6.7

## 2017-10-17 MED ORDER — IPRATROPIUM-ALBUTEROL 0.5-2.5 (3) MG/3ML IN SOLN
3.0000 mL | Freq: Once | RESPIRATORY_TRACT | Status: AC
  Administered 2017-10-17: 3 mL via RESPIRATORY_TRACT

## 2017-10-17 MED ORDER — ALBUTEROL SULFATE HFA 108 (90 BASE) MCG/ACT IN AERS
2.0000 | INHALATION_SPRAY | Freq: Once | RESPIRATORY_TRACT | Status: AC
Start: 2017-10-17 — End: 2017-10-17
  Administered 2017-10-17: 2 via RESPIRATORY_TRACT

## 2017-10-17 NOTE — ED Provider Notes (Signed)
[facility]   [id number] 10/17/17 Arrival Time: 1653  SUBJECTIVE:  Wendy Morgan is a 42 y.o. female who presents with asthma exacerbation that started 2 weeks ago.  She was seen on 10/09/17 and treated for asthma exacerbation with a z-pak and steroids.  Her symptoms improved temporarily.  Denies precipitating event.  Describes cough as constant and dry.  Has tried inhaler and breathing treatments without relief.  Complains of wheezing and SOB.  Denies fever, chills, fatigue, sinus pain, rhinorrhea, sore throat, chest pain, nausea, changes in bowel or bladder habits.    ROS: As per HPI.  Past Medical History:  Diagnosis Date  . Asthma   . Bronchiectasis with acute exacerbation (HCC)    History reviewed. No pertinent surgical history. No Known Allergies No current facility-administered medications on file prior to encounter.    Current Outpatient Medications on File Prior to Encounter  Medication Sig Dispense Refill  . albuterol (PROVENTIL HFA;VENTOLIN HFA) 108 (90 Base) MCG/ACT inhaler Inhale 1-2 puffs into the lungs every 6 (six) hours as needed for wheezing or shortness of breath. 1 Inhaler 5  . azithromycin (ZITHROMAX Z-PAK) 250 MG tablet Take as directed 6 each 0  . fluticasone (FLONASE) 50 MCG/ACT nasal spray Place 2 sprays into both nostrils daily. (Patient not taking: Reported on 09/12/2017) 1 g 0  . Fluticasone-Salmeterol (ADVAIR DISKUS) 250-50 MCG/DOSE AEPB Inhale 1 puff into the lungs 2 (two) times daily. 60 each 1  . ipratropium (ATROVENT HFA) 17 MCG/ACT inhaler Inhale 2 puffs into the lungs every 6 (six) hours as needed for wheezing. (Patient not taking: Reported on 09/12/2017) 1 Inhaler 0  . ipratropium (ATROVENT) 0.06 % nasal spray Place 2 sprays into both nostrils 4 (four) times daily. (Patient not taking: Reported on 09/12/2017) 15 mL 0  . predniSONE (DELTASONE) 10 MG tablet Take 1 tablet (10 mg total) by mouth daily. Take 4 tablets x 2 days, 3 tablets x 2 days, 2  tablets x 2 days, 1 tablet x 2 days 20 tablet 0    Social History   Socioeconomic History  . Marital status: Single    Spouse name: Not on file  . Number of children: Not on file  . Years of education: Not on file  . Highest education level: Not on file  Occupational History  . Not on file  Social Needs  . Financial resource strain: Not on file  . Food insecurity:    Worry: Not on file    Inability: Not on file  . Transportation needs:    Medical: Not on file    Non-medical: Not on file  Tobacco Use  . Smoking status: Former Smoker    Last attempt to quit: 1998    Years since quitting: 21.5  . Smokeless tobacco: Never Used  Substance and Sexual Activity  . Alcohol use: No  . Drug use: No  . Sexual activity: Not on file  Lifestyle  . Physical activity:    Days per week: Not on file    Minutes per session: Not on file  . Stress: Not on file  Relationships  . Social connections:    Talks on phone: Not on file    Gets together: Not on file    Attends religious service: Not on file    Active member of club or organization: Not on file    Attends meetings of clubs or organizations: Not on file    Relationship status: Not on file  . Intimate partner  violence:    Fear of current or ex partner: Not on file    Emotionally abused: Not on file    Physically abused: Not on file    Forced sexual activity: Not on file  Other Topics Concern  . Not on file  Social History Narrative  . Not on file   History reviewed. No pertinent family history.   OBJECTIVE:  Vitals:   10/17/17 1724 10/17/17 1825  BP: 116/68 132/78  Pulse: 76 79  Resp: (!) 22 (!) 22  Temp: (!) 97.5 F (36.4 C)   TempSrc: Temporal   SpO2: 98% 100%     General appearance: AOx3 in no acute distress; speaking in full sentences HEENT: PERRL.  EOM grossly intact.  Sinuses nontender; mild clear rhinorrhea; tonsils nonerythematous, uvula midline Neck: supple without LAD Lungs: Diffuse wheezes heard  throughout bilateral lung fields, minimal improvement with duo-neb given in office Heart: regular rate and rhythm.  Radial pulses 2+ symmetrical bilaterally Skin: warm and dry Psychological: alert and cooperative; normal mood and affect   ASSESSMENT & PLAN:  1. Moderate persistent asthma with exacerbation   2. Wheezing     Meds ordered this encounter  Medications  . ipratropium-albuterol (DUONEB) 0.5-2.5 (3) MG/3ML nebulizer solution 3 mL  . DISCONTD: beclomethasone (QVAR REDIHALER) 40 MCG/ACT inhaler    Sig: Inhale 1 puff into the lungs 2 (two) times daily.    Dispense:  1 Inhaler    Refill:  0    Order Specific Question:   Supervising Provider    Answer:   Isa RankinMURRAY, LAURA WILSON [phone][[id number]]  . beclomethasone (QVAR REDIHALER) 40 MCG/ACT inhaler    Sig: Inhale 1 puff into the lungs 2 (two) times daily.    Dispense:  1 Inhaler    Refill:  0    Order Specific Question:   Supervising Provider    Answer:   Isa RankinMURRAY, LAURA WILSON [phone][[id number]]  . albuterol (PROVENTIL HFA;VENTOLIN HFA) 108 (90 Base) MCG/ACT inhaler 2 puff    Albuterol inhaler given in office  Duo-neb treatment given in office Primary care provider assistance initiated to establish care  Follow up with PCP for further evaluation and management  Qvar prescribed with coupon Return or go to the ED if you have any new or worsening symptoms  Discussed patient case with Dr. Sheryle Hailiamond.  Recommended Q-var or daily inhaled corticosteroid.    Reviewed expectations re: course of current medical issues. Questions answered. Outlined signs and symptoms indicating need for more acute intervention. Patient verbalized understanding. After Visit Summary given.          Rennis HardingWurst, Mykira Hofmeister, PA-C 10/17/17 1828

## 2017-10-17 NOTE — Discharge Instructions (Addendum)
Albuterol inhaler given in office  Duo-neb treatment given in office Primary care provider assistance initiated to establish care  Follow up with PCP for further evaluation and management  Qvar prescribed with coupon Return or go to the ED if you have any new or worsening symptoms

## 2017-10-17 NOTE — ED Triage Notes (Addendum)
Asthma exacerbation .  Symptoms for 2 weeks.  Complains of coughing, chest congestion, wheezing.  Has inspiratory, expiratory wheezing, patient is speaking in complete sentences.

## 2017-12-25 ENCOUNTER — Emergency Department (HOSPITAL_COMMUNITY)
Admission: EM | Admit: 2017-12-25 | Discharge: 2017-12-26 | Disposition: A | Discharge status: Home / Self Care | Payer: Self-pay | Attending: Emergency Medicine | Admitting: Emergency Medicine

## 2017-12-25 ENCOUNTER — Encounter (HOSPITAL_COMMUNITY): Payer: Self-pay | Admitting: Emergency Medicine

## 2017-12-25 ENCOUNTER — Emergency Department (HOSPITAL_COMMUNITY): Payer: Self-pay

## 2017-12-25 ENCOUNTER — Ambulatory Visit (HOSPITAL_COMMUNITY)
Admission: EM | Admit: 2017-12-25 | Discharge: 2017-12-25 | Disposition: A | Discharge status: Home / Self Care | Payer: Self-pay | Attending: Family Medicine | Admitting: Family Medicine

## 2017-12-25 DIAGNOSIS — Z79899 Other long term (current) drug therapy: Secondary | ICD-10-CM | POA: Insufficient documentation

## 2017-12-25 DIAGNOSIS — J4531 Mild persistent asthma with (acute) exacerbation: Secondary | ICD-10-CM

## 2017-12-25 DIAGNOSIS — J45901 Unspecified asthma with (acute) exacerbation: Secondary | ICD-10-CM | POA: Insufficient documentation

## 2017-12-25 DIAGNOSIS — Z87891 Personal history of nicotine dependence: Secondary | ICD-10-CM | POA: Insufficient documentation

## 2017-12-25 MED ORDER — ALBUTEROL SULFATE (2.5 MG/3ML) 0.083% IN NEBU
2.5000 mg | INHALATION_SOLUTION | Freq: Once | RESPIRATORY_TRACT | Status: AC
  Administered 2017-12-25: 2.5 mg via RESPIRATORY_TRACT

## 2017-12-25 MED ORDER — ALBUTEROL SULFATE (2.5 MG/3ML) 0.083% IN NEBU
INHALATION_SOLUTION | RESPIRATORY_TRACT | Status: AC
  Filled 2017-12-25: qty 3

## 2017-12-25 MED ORDER — METHYLPREDNISOLONE SODIUM SUCC 125 MG IJ SOLR
125.0000 mg | Freq: Once | INTRAMUSCULAR | Status: AC
  Administered 2017-12-26: 125 mg via INTRAMUSCULAR
  Filled 2017-12-25: qty 2

## 2017-12-25 MED ORDER — ALBUTEROL SULFATE HFA 108 (90 BASE) MCG/ACT IN AERS: 1.0000 | INHALATION_SPRAY | Freq: Four times a day (QID) | RESPIRATORY_TRACT | 2 refills | Status: DC | PRN

## 2017-12-25 MED ORDER — ALBUTEROL SULFATE (2.5 MG/3ML) 0.083% IN NEBU
5.0000 mg | INHALATION_SOLUTION | Freq: Once | RESPIRATORY_TRACT | Status: AC
  Administered 2017-12-25: 5 mg via RESPIRATORY_TRACT
  Filled 2017-12-25: qty 6

## 2017-12-25 MED ORDER — IPRATROPIUM-ALBUTEROL 0.5-2.5 (3) MG/3ML IN SOLN
3.0000 mL | Freq: Once | RESPIRATORY_TRACT | Status: AC
  Administered 2017-12-26: 3 mL via RESPIRATORY_TRACT
  Filled 2017-12-25: qty 3

## 2017-12-25 MED ORDER — PREDNISONE 10 MG (21) PO TBPK: ORAL_TABLET | Freq: Every day | ORAL | 0 refills | Status: DC

## 2017-12-25 NOTE — ED Notes (Signed)
Patient transported to X-ray 

## 2017-12-25 NOTE — ED Triage Notes (Signed)
Pt reports asthma exacerbation onset this AM. Pt noted to have audible wheezing.

## 2017-12-25 NOTE — ED Triage Notes (Addendum)
Pt c/o asthma flair up today, audible wheezing noted. Pt speaking in full sentences, in NAD. Pt ran out of all her inhalers

## 2017-12-25 NOTE — ED Provider Notes (Signed)
[facility]   [id number] 12/25/17 Arrival Time: 1732  ASSESSMENT & PLAN:  1. Mild persistent asthma with exacerbation    Nebulizer treatment given: yes. Improvement: moderate.  Meds ordered this encounter  Medications  . albuterol (PROVENTIL) (2.5 MG/3ML) 0.083% nebulizer solution 2.5 mg  . albuterol (PROVENTIL HFA;VENTOLIN HFA) 108 (90 Base) MCG/ACT inhaler    Sig: Inhale 1-2 puffs into the lungs every 6 (six) hours as needed for wheezing or shortness of breath.    Dispense:  1 Inhaler    Refill:  2  . predniSONE (STERAPRED UNI-PAK 21 TAB) 10 MG (21) TBPK tablet    Sig: Take by mouth daily. Take as directed.    Dispense:  21 tablet    Refill:  0   Follow-up Information    [facility] EMERGENCY DEPARTMENT.   Specialty:  Emergency Medicine Why:  If symptoms worsen. Contact information: [address] [id number] mc [place] [address] [phone]         Asthma precautions given. OTC symptom care as needed.  Reviewed expectations re: course of current medical issues. Questions answered. Outlined signs and symptoms indicating need for more acute intervention. Patient verbalized understanding. After Visit Summary given.  SUBJECTIVE: History from: patient.  Wendy Morgan is a 42 y.o. female who presents with complaint of persistent wheezing. Triggers: None. Onset abrupt, today. Describes wheezing as moderate when present. Fever: no. Overall normal PO intake without n/v. Sick contacts: no. Typically her asthma is well controlled. Inhaler use: infrequently; but has run out and requests refill. OTC treatment: none. No associated CP. Exertion worsens wheezing. Better with rest and cool environment.  Social History   Tobacco Use  Smoking Status Former Smoker  . Last attempt to quit: 1998  . Years since quitting: 21.7  Smokeless Tobacco Never Used   ROS: As per HPI.   OBJECTIVE:  Vitals:   12/25/17 1740    BP: (!) 121/107  Pulse: 86  Resp: 16  Temp: 97.8 F (36.6 C)  SpO2: 96%     General appearance: alert; appears fatigued HEENT: nasal congestion; clear runny nose; throat irritation secondary to post-nasal drainage Neck: supple without LAD Lungs: unlabored respirations, moderate bilateral wheezing; cough: mild; no significant respiratory distress; clears after neb CV: RRR Skin: warm and dry Psychological: alert and cooperative; normal mood and affect   No Known Allergies  Past Medical History:  Diagnosis Date  . Asthma   . Bronchiectasis with acute exacerbation (HCC)    No family history on file. Social History   Socioeconomic History  . Marital status: Single    Spouse name: Not on file  . Number of children: Not on file  . Years of education: Not on file  . Highest education level: Not on file  Occupational History  . Not on file  Social Needs  . Financial resource strain: Not on file  . Food insecurity:    Worry: Not on file    Inability: Not on file  . Transportation needs:    Medical: Not on file    Non-medical: Not on file  Tobacco Use  . Smoking status: Former Smoker    Last attempt to quit: 1998    Years since quitting: 21.7  . Smokeless tobacco: Never Used  Substance and Sexual Activity  . Alcohol use: No  . Drug use: No  . Sexual activity: Not on file  Lifestyle  . Physical activity:    Days per week: Not on file  Minutes per session: Not on file  . Stress: Not on file  Relationships  . Social connections:    Talks on phone: Not on file    Gets together: Not on file    Attends religious service: Not on file    Active member of club or organization: Not on file    Attends meetings of clubs or organizations: Not on file    Relationship status: Not on file  . Intimate partner violence:    Fear of current or ex partner: Not on file    Emotionally abused: Not on file    Physically abused: Not on file    Forced sexual activity: Not on file   Other Topics Concern  . Not on file  Social History Narrative  . Not on file            Mardella Layman, MD 12/26/17 [phone]

## 2017-12-25 NOTE — ED Provider Notes (Signed)
[facility] EMERGENCY DEPARTMENT Provider Note   CSN: [id number] Arrival date & time: 12/25/17  2224     History   Chief Complaint Chief Complaint  Patient presents with  . Asthma    HPI Wendy Morgan is a 42 y.o. female has no history of asthma who presents for evaluation of shortness of wheezing this is been ongoing today.  Patient reports that symptoms began earlier today.  She states that she had been outside for a long period of time after wheezing started.  She states that she did not have any inhalers at home so she went to urgent care.  She reports that urgent care, she received 1 times breathing treatment.  She had some improvement was discharged home with a prescription for prednisone which she states she has not taken it.  Patient reports that while she was at home, she felt that the wheezing and shortness of breath was returning, prompting ED visit.  Patient also reports she has had some chest tightness.  She states that the symptoms are consistent when she has previous asthma exacerbations.  She states she has never had a been hospitalized or intubated for her asthma.  Patient denies any recent cough, nasal congestion, fever.  She states that she does not know what her asthma trigger is.  Patient got a breathing treatment in ED arrival and reports improvement in symptoms.  The history is provided by the patient.    Past Medical History:  Diagnosis Date  . Asthma   . Bronchiectasis with acute exacerbation (HCC)     There are no active problems to display for this patient.   History reviewed. No pertinent surgical history.   OB History   None      Home Medications    Prior to Admission medications   Medication Sig Start Date End Date Taking? Authorizing Provider  albuterol (PROVENTIL HFA;VENTOLIN HFA) 108 (90 Base) MCG/ACT inhaler Inhale 1-2 puffs into the lungs every 6 (six) hours as needed for wheezing or shortness of breath. 12/25/17    Mardella Layman, MD  azithromycin (ZITHROMAX Z-PAK) 250 MG tablet Take as directed Patient not taking: Reported on 12/25/2017 10/09/17   Frederica Kuster, MD  beclomethasone (QVAR REDIHALER) 40 MCG/ACT inhaler Inhale 1 puff into the lungs 2 (two) times daily. 10/17/17   Wurst, [place], PA-C  fluticasone (FLONASE) 50 MCG/ACT nasal spray Place 2 sprays into both nostrils daily. Patient not taking: Reported on 09/12/2017 04/26/17   Belinda Fisher, PA-C  Fluticasone-Salmeterol (ADVAIR DISKUS) 250-50 MCG/DOSE AEPB Inhale 1 puff into the lungs 2 (two) times daily. Patient not taking: Reported on 12/25/2017 06/26/17   Jaynie Crumble, PA-C  ipratropium (ATROVENT HFA) 17 MCG/ACT inhaler Inhale 2 puffs into the lungs every 6 (six) hours as needed for wheezing. Patient not taking: Reported on 09/12/2017 10/11/16   Bethel Born, PA-C  ipratropium (ATROVENT) 0.06 % nasal spray Place 2 sprays into both nostrils 4 (four) times daily. Patient not taking: Reported on 09/12/2017 05/06/17   Belinda Fisher, PA-C  predniSONE (STERAPRED UNI-PAK 21 TAB) 10 MG (21) TBPK tablet Take by mouth daily. Take as directed. 12/25/17   Mardella Layman, MD    Family History No family history on file.  Social History Social History   Tobacco Use  . Smoking status: Former Smoker    Last attempt to quit: 1998    Years since quitting: 21.7  . Smokeless tobacco: Never Used  Substance Use Topics  .  Alcohol use: No  . Drug use: No     Allergies   Patient has no known allergies.   Review of Systems Review of Systems  Constitutional: Negative for fever.  HENT: Negative for congestion and rhinorrhea.   Respiratory: Positive for chest tightness, shortness of breath and wheezing. Negative for cough.   Cardiovascular: Negative for chest pain.  All other systems reviewed and are negative.    Physical Exam Updated Vital Signs BP 135/80 (BP Location: Right Arm)   Pulse 81   Temp 97.9 F (36.6 C) (Oral)   Resp 18   SpO2 100%    Physical Exam  Constitutional: She is oriented to person, place, and time. She appears well-developed and well-nourished.  HENT:  Head: Normocephalic and atraumatic.  Mouth/Throat: Oropharynx is clear and moist and mucous membranes are normal.  Eyes: Pupils are equal, round, and reactive to light. Conjunctivae, EOM and lids are normal.  Neck: Full passive range of motion without pain.  Cardiovascular: Normal rate, regular rhythm, normal heart sounds and normal pulses. Exam reveals no gallop and no friction rub.  No murmur heard. Pulmonary/Chest: Effort normal. She has wheezes.  Able to speak in full sentences without any difficulty.  No evidence of respiratory distress.  Diffuse wheezing noted throughout all lung fields.  Abdominal: Soft. Normal appearance. There is no tenderness. There is no rigidity and no guarding.  Musculoskeletal: Normal range of motion.  Neurological: She is alert and oriented to person, place, and time.  Skin: Skin is warm and dry. Capillary refill takes less than 2 seconds.  Psychiatric: She has a normal mood and affect. Her speech is normal.  Nursing note and vitals reviewed.    ED Treatments / Results  Labs (all labs ordered are listed, but only abnormal results are displayed) Labs Reviewed - No data to display  EKG None  Radiology Dg Chest 2 View  Result Date: 12/26/2017 CLINICAL DATA:  Shortness of breath EXAM: CHEST - 2 VIEW COMPARISON:  09/12/2017 FINDINGS: The heart size and mediastinal contours are within normal limits. Both lungs are clear. The visualized skeletal structures are unremarkable. IMPRESSION: No active cardiopulmonary disease. Electronically Signed   By: Jasmine Pang M.D.   On: 12/26/2017 00:00    Procedures Procedures (including critical care time)  Medications Ordered in ED Medications  albuterol (PROVENTIL) (2.5 MG/3ML) 0.083% nebulizer solution 5 mg (5 mg Nebulization Given 12/25/17 2235)  methylPREDNISolone sodium  succinate (SOLU-MEDROL) 125 mg/2 mL injection 125 mg (125 mg Intramuscular Given 12/26/17 0001)  ipratropium-albuterol (DUONEB) 0.5-2.5 (3) MG/3ML nebulizer solution 3 mL (3 mLs Nebulization Given 12/26/17 0001)     Initial Impression / Assessment and Plan / ED Course  I have reviewed the triage vital signs and the nursing notes.  Pertinent labs & imaging results that were available during my care of the patient were reviewed by me and considered in my medical decision making (see chart for details).     42 year old female who presents for evaluation of shortness of breath and wheezing.  Symptoms began today.  She did not have any inhalers at home.  Was seen in urgent care earlier today and had one breathing treatments prescribed prednisone.  Reports she went home and continued to have symptoms, prompting ED visit.  No recent fever, cough. Patient is afebrile, non-toxic appearing, sitting comfortably on examination table. Vital signs reviewed and stable.  On exam, with faint wheezing noted throughout.  She had already had one breathing treatment prior to my evaluation.  She reports improvement in wheezing.  Will give additional breathing treatment as well as steroids.  We will plan to check chest x-ray.  Consider asthma exacerbation versus acute infectious etiology.  History/physical exam is not concerning for PE.  Reevaluation after second nebulizer treatment.  Patient with improvement in wheezing.  Patient reports she feels better.  Chest x-ray negative for any acute abnormality.  Discussed results with patient.  We will plan to ambulate patient want to checking pulse ox.  Patient able to ambulate with maintaining O2 sats at 96 to 99% on room air.  Patient reported no difficulty with ambulation.  Patient stable for discharge at this time.  Encourage at home supportive care measures.  She was Artie prescribed prednisone and albuterol inhaler from the urgent care visit, she has with her.  Encouraged  her to use those. Patient had ample opportunity for questions and discussion. All patient's questions were answered with full understanding. Strict return precautions discussed. Patient expresses understanding and agreement to plan.   Final Clinical Impressions(s) / ED Diagnoses   Final diagnoses:  Exacerbation of asthma, unspecified asthma severity, unspecified whether persistent    ED Discharge Orders    None       Rosana Hoes 12/26/17 0032    Shon Baton, MD 12/26/17 [phone]

## 2017-12-26 NOTE — Discharge Instructions (Signed)
Take prednisone as directed.  Use albuterol inhaler as directed.  Follow-up with [facility] to establish primary care.  Return the emergency department for any worsening symptoms, difficulty breathing, shortness of breath, chest pain or any other worsening or concerning symptoms.

## 2017-12-26 NOTE — ED Notes (Signed)
Pt ambulated down the hall with pulse ox sating at 96%-99%. Pt in no distrerss

## 2017-12-26 NOTE — ED Notes (Signed)
Patient verbalizes understanding of discharge instructions. Opportunity for questioning and answers were provided. Armband removed by staff, pt discharged from ED ambulatory.   

## 2018-01-01 ENCOUNTER — Encounter (HOSPITAL_COMMUNITY): Payer: Self-pay | Admitting: Emergency Medicine

## 2018-01-01 ENCOUNTER — Emergency Department (HOSPITAL_COMMUNITY)
Admission: EM | Admit: 2018-01-01 | Discharge: 2018-01-02 | Disposition: A | Discharge status: Home / Self Care | Payer: Self-pay | Attending: Emergency Medicine | Admitting: Emergency Medicine

## 2018-01-01 ENCOUNTER — Other Ambulatory Visit: Payer: Self-pay

## 2018-01-01 DIAGNOSIS — R062 Wheezing: Secondary | ICD-10-CM | POA: Insufficient documentation

## 2018-01-01 DIAGNOSIS — Z5321 Procedure and treatment not carried out due to patient leaving prior to being seen by health care provider: Secondary | ICD-10-CM | POA: Insufficient documentation

## 2018-01-01 MED ORDER — ALBUTEROL SULFATE (2.5 MG/3ML) 0.083% IN NEBU
5.0000 mg | INHALATION_SOLUTION | Freq: Once | RESPIRATORY_TRACT | Status: AC
  Administered 2018-01-01: 5 mg via RESPIRATORY_TRACT
  Filled 2018-01-01: qty 6

## 2018-01-01 NOTE — ED Triage Notes (Signed)
Patient with asthma and is out of her albuterol inhaler.  Patient with wheezing in all fields.

## 2018-01-02 ENCOUNTER — Emergency Department (HOSPITAL_COMMUNITY)
Admission: EM | Admit: 2018-01-02 | Discharge: 2018-01-02 | Disposition: A | Discharge status: Home / Self Care | Payer: Self-pay | Attending: Emergency Medicine | Admitting: Emergency Medicine

## 2018-01-02 ENCOUNTER — Encounter (HOSPITAL_COMMUNITY): Payer: Self-pay

## 2018-01-02 DIAGNOSIS — Z79899 Other long term (current) drug therapy: Secondary | ICD-10-CM | POA: Insufficient documentation

## 2018-01-02 DIAGNOSIS — J4520 Mild intermittent asthma, uncomplicated: Secondary | ICD-10-CM | POA: Insufficient documentation

## 2018-01-02 DIAGNOSIS — Z87891 Personal history of nicotine dependence: Secondary | ICD-10-CM | POA: Insufficient documentation

## 2018-01-02 MED ORDER — ALBUTEROL SULFATE (2.5 MG/3ML) 0.083% IN NEBU
5.0000 mg | INHALATION_SOLUTION | Freq: Once | RESPIRATORY_TRACT | Status: AC
  Administered 2018-01-02: 5 mg via RESPIRATORY_TRACT
  Filled 2018-01-02: qty 6

## 2018-01-02 MED ORDER — IPRATROPIUM BROMIDE 0.02 % IN SOLN
0.5000 mg | Freq: Once | RESPIRATORY_TRACT | Status: AC
  Administered 2018-01-02: 0.5 mg via RESPIRATORY_TRACT

## 2018-01-02 MED ORDER — DEXAMETHASONE SODIUM PHOSPHATE 10 MG/ML IJ SOLN
10.0000 mg | Freq: Once | INTRAMUSCULAR | Status: AC
  Administered 2018-01-02: 10 mg via INTRAMUSCULAR
  Filled 2018-01-02: qty 1

## 2018-01-02 MED ORDER — ALBUTEROL SULFATE HFA 108 (90 BASE) MCG/ACT IN AERS: 1.0000 | INHALATION_SPRAY | Freq: Four times a day (QID) | RESPIRATORY_TRACT | 0 refills | Status: DC | PRN

## 2018-01-02 MED ORDER — ALBUTEROL SULFATE (5 MG/ML) 0.5% IN NEBU: 5.0000 mg | INHALATION_SOLUTION | Freq: Four times a day (QID) | RESPIRATORY_TRACT | 0 refills | Status: DC | PRN

## 2018-01-02 NOTE — Discharge Instructions (Signed)
Nebs every 4-6 hours for wheezing/shortness of breath, try to reserve inhaler for rescue. Follow-up with the patient care center-- can call in the morning to schedule appt. Return to the ED for new or worsening symptoms.

## 2018-01-02 NOTE — ED Triage Notes (Signed)
Pt was here earlier, LWBS, continued wheezing all fields

## 2018-01-02 NOTE — ED Provider Notes (Signed)
[facility] EMERGENCY DEPARTMENT Provider Note   CSN: [id number] Arrival date & time: 01/02/18  0137     History   Chief Complaint Chief Complaint  Patient presents with  . Asthma    HPI Wendy Morgan is a 42 y.o. female.  The history is provided by the patient and medical records.  Asthma  Associated symptoms include shortness of breath.    42 y.o. F with hx of asthma, presenting to the ED for asthma exacerbation.  She came to ED earlier this evening, was given neb treatment in triage but then left to "go change her clothes".  She returns due to recurrent wheezing and SOB.  No cough, fever, chest pain.  No recent URI symptoms.  Patient with frequent ED visits for same.  Patient is out of her home inhaler as well as solution for her neb machine.  Past Medical History:  Diagnosis Date  . Asthma   . Bronchiectasis with acute exacerbation (HCC)     There are no active problems to display for this patient.   History reviewed. No pertinent surgical history.   OB History   None      Home Medications    Prior to Admission medications   Medication Sig Start Date End Date Taking? Authorizing Provider  albuterol (PROVENTIL HFA;VENTOLIN HFA) 108 (90 Base) MCG/ACT inhaler Inhale 1-2 puffs into the lungs every 6 (six) hours as needed for wheezing or shortness of breath. 12/25/17   Mardella Layman, MD  azithromycin (ZITHROMAX Z-PAK) 250 MG tablet Take as directed Patient not taking: Reported on 12/25/2017 10/09/17   Frederica Kuster, MD  beclomethasone (QVAR REDIHALER) 40 MCG/ACT inhaler Inhale 1 puff into the lungs 2 (two) times daily. 10/17/17   Wurst, [place], PA-C  fluticasone (FLONASE) 50 MCG/ACT nasal spray Place 2 sprays into both nostrils daily. Patient not taking: Reported on 09/12/2017 04/26/17   Belinda Fisher, PA-C  Fluticasone-Salmeterol (ADVAIR DISKUS) 250-50 MCG/DOSE AEPB Inhale 1 puff into the lungs 2 (two) times daily. Patient not taking: Reported on  12/25/2017 06/26/17   Jaynie Crumble, PA-C  ipratropium (ATROVENT HFA) 17 MCG/ACT inhaler Inhale 2 puffs into the lungs every 6 (six) hours as needed for wheezing. Patient not taking: Reported on 09/12/2017 10/11/16   Bethel Born, PA-C  ipratropium (ATROVENT) 0.06 % nasal spray Place 2 sprays into both nostrils 4 (four) times daily. Patient not taking: Reported on 09/12/2017 05/06/17   Belinda Fisher, PA-C  predniSONE (STERAPRED UNI-PAK 21 TAB) 10 MG (21) TBPK tablet Take by mouth daily. Take as directed. 12/25/17   Mardella Layman, MD    Family History No family history on file.  Social History Social History   Tobacco Use  . Smoking status: Former Smoker    Last attempt to quit: 1998    Years since quitting: 21.7  . Smokeless tobacco: Never Used  Substance Use Topics  . Alcohol use: No  . Drug use: No     Allergies   Patient has no known allergies.   Review of Systems Review of Systems  Respiratory: Positive for shortness of breath and wheezing.   All other systems reviewed and are negative.    Physical Exam Updated Vital Signs BP 114/88   Pulse 84   Temp 98 F (36.7 C) (Oral)   Resp 20   SpO2 94%   Physical Exam  Constitutional: She is oriented to person, place, and time. She appears well-developed and well-nourished.  HENT:  Head:  Normocephalic and atraumatic.  Right Ear: Tympanic membrane and ear canal normal.  Left Ear: Tympanic membrane and ear canal normal.  Nose: Nose normal.  Mouth/Throat: Uvula is midline, oropharynx is clear and moist and mucous membranes are normal.  Eyes: Pupils are equal, round, and reactive to light. Conjunctivae and EOM are normal.  Neck: Normal range of motion.  Cardiovascular: Normal rate, regular rhythm and normal heart sounds.  Pulmonary/Chest: Effort normal. She has wheezes.  Mild expiratory wheezes, NAD, able to speak in full sentences without issue  Abdominal: Soft. Bowel sounds are normal. There is no tenderness. There  is no rebound.  Musculoskeletal: Normal range of motion.  Neurological: She is alert and oriented to person, place, and time.  Skin: Skin is warm and dry.  Psychiatric: She has a normal mood and affect.  Nursing note and vitals reviewed.    ED Treatments / Results  Labs (all labs ordered are listed, but only abnormal results are displayed) Labs Reviewed - No data to display  EKG None  Radiology No results found.  Procedures Procedures (including critical care time)  Medications Ordered in ED Medications  albuterol (PROVENTIL) (2.5 MG/3ML) 0.083% nebulizer solution 5 mg (5 mg Nebulization Given 01/02/18 0149)  dexamethasone (DECADRON) injection 10 mg (10 mg Intramuscular Given 01/02/18 0158)  ipratropium (ATROVENT) nebulizer solution 0.5 mg (0.5 mg Nebulization Given 01/02/18 0149)     Initial Impression / Assessment and Plan / ED Course  I have reviewed the triage vital signs and the nursing notes.  Pertinent labs & imaging results that were available during my care of the patient were reviewed by me and considered in my medical decision making (see chart for details).  42 year old female here complaining of asthma attack.  Came in earlier for same, given treatment, and left but returns due to recurrent symptoms.  She is afebrile and nontoxic.  Had some initial wheezing on exam, this resolved after DuoNeb and Decadron.  Patient reports feeling significantly better.  Vital signs are stable.  No reported chest pain or URI type symptoms.  Do not feel she needs further work-up.  Given prescription for albuterol inhaler as well as solution for neb machine.  Does not currently have PCP or insurance, given information for the patient care center for follow-up.  She will return here for any new or worsening symptoms.  Final Clinical Impressions(s) / ED Diagnoses   Final diagnoses:  Mild intermittent asthma without complication    ED Discharge Orders         Ordered    albuterol  (PROVENTIL HFA;VENTOLIN HFA) 108 (90 Base) MCG/ACT inhaler  Every 6 hours PRN     01/02/18 0231    albuterol (PROVENTIL) (5 MG/ML) 0.5% nebulizer solution  Every 6 hours PRN     01/02/18 0231           Garlon HatchetSanders, Verle Brillhart M, PA-C 01/02/18 [id number]    Zadie RhineWickline, Donald, MD 01/02/18 [phone]

## 2018-01-02 NOTE — ED Notes (Signed)
No answer for vitals x 3  

## 2018-01-08 ENCOUNTER — Encounter (HOSPITAL_COMMUNITY): Payer: Self-pay | Admitting: Emergency Medicine

## 2018-01-08 ENCOUNTER — Other Ambulatory Visit: Payer: Self-pay

## 2018-01-08 ENCOUNTER — Emergency Department (HOSPITAL_COMMUNITY)
Admission: EM | Admit: 2018-01-08 | Discharge: 2018-01-09 | Disposition: A | Discharge status: Home / Self Care | Payer: Self-pay | Attending: Emergency Medicine | Admitting: Emergency Medicine

## 2018-01-08 ENCOUNTER — Emergency Department (HOSPITAL_COMMUNITY)
Admission: EM | Admit: 2018-01-08 | Discharge: 2018-01-08 | Discharge status: Against Medical Advice | Payer: Self-pay | Attending: Emergency Medicine | Admitting: Emergency Medicine

## 2018-01-08 DIAGNOSIS — Z87891 Personal history of nicotine dependence: Secondary | ICD-10-CM | POA: Insufficient documentation

## 2018-01-08 DIAGNOSIS — Z79899 Other long term (current) drug therapy: Secondary | ICD-10-CM | POA: Insufficient documentation

## 2018-01-08 DIAGNOSIS — Z5321 Procedure and treatment not carried out due to patient leaving prior to being seen by health care provider: Secondary | ICD-10-CM

## 2018-01-08 DIAGNOSIS — J45901 Unspecified asthma with (acute) exacerbation: Secondary | ICD-10-CM | POA: Insufficient documentation

## 2018-01-08 MED ORDER — ALBUTEROL SULFATE (2.5 MG/3ML) 0.083% IN NEBU
5.0000 mg | INHALATION_SOLUTION | Freq: Once | RESPIRATORY_TRACT | Status: AC
  Administered 2018-01-08: 5 mg via RESPIRATORY_TRACT
  Filled 2018-01-08: qty 6

## 2018-01-08 NOTE — ED Notes (Signed)
No answer for treatment room. 

## 2018-01-08 NOTE — ED Triage Notes (Signed)
Pt c/o asthma flare. Wheezing noted, non-productive cough.

## 2018-01-08 NOTE — ED Notes (Signed)
Called pt's cell phone number and asked to speak with pt.  Person said hold on and then said that  she was at hospital and they didn't have anyway of getting in touch with her.

## 2018-01-08 NOTE — ED Notes (Signed)
No answer in waiting area.

## 2018-01-08 NOTE — ED Triage Notes (Signed)
Pt here earlier for asthma flare, had to leave to pick up child. Reports improvement from breathing treatment that she received earlier.

## 2018-01-09 MED ORDER — LORATADINE 10 MG PO TABS
10.0000 mg | ORAL_TABLET | Freq: Once | ORAL | Status: AC
  Administered 2018-01-09: 10 mg via ORAL
  Filled 2018-01-09: qty 1

## 2018-01-09 MED ORDER — IPRATROPIUM BROMIDE 0.02 % IN SOLN
0.5000 mg | Freq: Once | RESPIRATORY_TRACT | Status: AC
  Administered 2018-01-09: 0.5 mg via RESPIRATORY_TRACT
  Filled 2018-01-09: qty 2.5

## 2018-01-09 MED ORDER — DEXAMETHASONE SODIUM PHOSPHATE 10 MG/ML IJ SOLN
10.0000 mg | Freq: Once | INTRAMUSCULAR | Status: AC
  Administered 2018-01-09: 10 mg via INTRAMUSCULAR
  Filled 2018-01-09: qty 1

## 2018-01-09 MED ORDER — MONTELUKAST SODIUM 10 MG PO TABS: 10.0000 mg | ORAL_TABLET | Freq: Every day | ORAL | 0 refills | Status: DC

## 2018-01-09 MED ORDER — ALBUTEROL SULFATE (2.5 MG/3ML) 0.083% IN NEBU
5.0000 mg | INHALATION_SOLUTION | Freq: Once | RESPIRATORY_TRACT | Status: AC
  Administered 2018-01-09: 5 mg via RESPIRATORY_TRACT
  Filled 2018-01-09: qty 6

## 2018-01-09 NOTE — ED Provider Notes (Signed)
[facility] Provider Note   CSN: [id number] Arrival date & time: 01/08/18  2143     History   Chief Complaint Chief Complaint  Patient presents with  . Asthma    HPI Wendy Morgan is a 42 y.o. female.   42 year old female with a history of asthma presents to the emergency department for worsening shortness of breath and wheezing.  Been seen in the emergency department frequently for asthma flares as of late.  States that she lost her insurance coverage and has been unable to follow-up with her primary care doctor or afford her inhaled corticosteroid.  Has been using her albuterol inhaler for management of dyspnea and wheezing, but this has not been helping her today.  Has had some improvement with oral steroids in the past.  Denies taking this recently.  No associated fevers.  Patient does report increased asthma exacerbations with seasonal changes.  States symptoms today feel consistent with prior asthma flares.     Past Medical History:  Diagnosis Date  . Asthma   . Bronchiectasis with acute exacerbation (HCC)     There are no active problems to display for this patient.   History reviewed. No pertinent surgical history.   OB History   None      Home Medications    Prior to Admission medications   Medication Sig Start Date End Date Taking? Authorizing Provider  albuterol (PROVENTIL HFA;VENTOLIN HFA) 108 (90 Base) MCG/ACT inhaler Inhale 1-2 puffs into the lungs every 6 (six) hours as needed for wheezing. 01/02/18   Garlon HatchetSanders, Lisa M, PA-C  albuterol (PROVENTIL) (5 MG/ML) 0.5% nebulizer solution Take 1 mL (5 mg total) by nebulization every 6 (six) hours as needed for wheezing or shortness of breath. 01/02/18   Garlon HatchetSanders, Lisa M, PA-C  azithromycin (ZITHROMAX Z-PAK) 250 MG tablet Take as directed Patient not taking: Reported on 12/25/2017 10/09/17   Frederica KusterMiller, Stephen M, MD  beclomethasone (QVAR REDIHALER) 40 MCG/ACT inhaler Inhale 1  puff into the lungs 2 (two) times daily. 10/17/17   Wurst, [place], PA-C  fluticasone (FLONASE) 50 MCG/ACT nasal spray Place 2 sprays into both nostrils daily. Patient not taking: Reported on 09/12/2017 04/26/17   Belinda FisherYu, Amy V, PA-C  Fluticasone-Salmeterol (ADVAIR DISKUS) 250-50 MCG/DOSE AEPB Inhale 1 puff into the lungs 2 (two) times daily. Patient not taking: Reported on 12/25/2017 06/26/17   Jaynie CrumbleKirichenko, Tatyana, PA-C  ipratropium (ATROVENT HFA) 17 MCG/ACT inhaler Inhale 2 puffs into the lungs every 6 (six) hours as needed for wheezing. Patient not taking: Reported on 09/12/2017 10/11/16   Bethel BornGekas, Chelbie Jarnagin Marie, PA-C  ipratropium (ATROVENT) 0.06 % nasal spray Place 2 sprays into both nostrils 4 (four) times daily. Patient not taking: Reported on 09/12/2017 05/06/17   Belinda FisherYu, Amy V, PA-C  montelukast (SINGULAIR) 10 MG tablet Take 1 tablet (10 mg total) by mouth at bedtime. 01/09/18   Antony MaduraHumes, Darlisa Spruiell, PA-C  predniSONE (STERAPRED UNI-PAK 21 TAB) 10 MG (21) TBPK tablet Take by mouth daily. Take as directed. 12/25/17   Mardella LaymanHagler, Brian, MD    Family History No family history on file.  Social History Social History   Tobacco Use  . Smoking status: Former Smoker    Last attempt to quit: 1998    Years since quitting: 21.7  . Smokeless tobacco: Never Used  Substance Use Topics  . Alcohol use: No  . Drug use: No     Allergies   Patient has no known allergies.   Review of Systems  Review of Systems Ten systems reviewed and are negative for acute change, except as noted in the HPI.    Physical Exam Updated Vital Signs BP (!) 133/109 (BP Location: Right Arm)   Pulse 79   Temp (!) 97.5 F (36.4 C) (Oral)   Resp 17   SpO2 95%   Physical Exam  Constitutional: She is oriented to person, place, and time. She appears well-developed and well-nourished. No distress.  Nontoxic appearing and in NAD  HENT:  Head: Normocephalic and atraumatic.  Eyes: Conjunctivae and EOM are normal. No scleral icterus.  Neck:  Normal range of motion.  Cardiovascular: Normal rate, regular rhythm and intact distal pulses.  Pulmonary/Chest: Effort normal. No respiratory distress. She has wheezes (diffuse expiratory).  Expiratory wheeze without tachypnea or dyspnea. Speaking in full sentences.  Musculoskeletal: Normal range of motion.  Neurological: She is alert and oriented to person, place, and time. She exhibits normal muscle tone. Coordination normal.  Skin: Skin is warm and dry. No rash noted. She is not diaphoretic. No erythema. No pallor.  Psychiatric: She has a normal mood and affect. Her behavior is normal.  Nursing note and vitals reviewed.    ED Treatments / Results  Labs (all labs ordered are listed, but only abnormal results are displayed) Labs Reviewed - No data to display  EKG None  Radiology No results found.  Procedures Procedures (including critical care time)  Medications Ordered in ED Medications  dexamethasone (DECADRON) injection 10 mg (10 mg Intramuscular Given 01/09/18 0031)  albuterol (PROVENTIL) (2.5 MG/3ML) 0.083% nebulizer solution 5 mg (5 mg Nebulization Given 01/09/18 0031)  ipratropium (ATROVENT) nebulizer solution 0.5 mg (0.5 mg Nebulization Given 01/09/18 0031)  loratadine (CLARITIN) tablet 10 mg (10 mg Oral Given 01/09/18 0030)    1:07 AM Lungs clear to auscultation on repeat assessment.  Patient states that she is breathing at baseline.   Initial Impression / Assessment and Plan / ED Course  I have reviewed the triage vital signs and the nursing notes.  Pertinent labs & imaging results that were available during my care of the patient were reviewed by me and considered in my medical decision making (see chart for details).     42 year old female presents to the emergency department for persistent wheezing with shortness of breath.  History of asthma, but has been unable to fill her inhaled corticosteroid secondary to lack of insurance coverage.  This has also  prevented the patient from following up with her primary doctor.  She was given a dose of Decadron in the emergency department as well as a DuoNeb.  This has improved her wheezing.  The patient has no hypoxia.  Her vitals have been stable.  No infectious symptoms such as congestion, rhinorrhea, fever.  Will discharge with Singulair to try and prevent ongoing asthma flare.  She has albuterol at home which she can continue to use.  Return precautions discussed and provided. Patient discharged in stable condition with no unaddressed concerns.   Final Clinical Impressions(s) / ED Diagnoses   Final diagnoses:  Exacerbation of asthma, unspecified asthma severity, unspecified whether persistent    ED Discharge Orders         Ordered    montelukast (SINGULAIR) 10 MG tablet  Daily at bedtime     01/09/18 0104           Antony Madura, PA-C 01/09/18 1610    Glynn Octave, MD 01/09/18 [phone]

## 2018-01-09 NOTE — ED Notes (Signed)
Please see provider note for further assessment and evaluation

## 2018-01-09 NOTE — Discharge Instructions (Signed)
Use daily Singulair to try and prevent persistent asthma exacerbations.  You may supplement this with Claritin or Zyrtec.  Use 2 puffs of an albuterol inhaler every 4-6 hours for ongoing wheezing or shortness of breath.  Follow-up with a primary doctor.

## 2018-01-12 ENCOUNTER — Emergency Department (HOSPITAL_COMMUNITY)
Admission: EM | Admit: 2018-01-12 | Discharge: 2018-01-12 | Disposition: A | Discharge status: Home / Self Care | Payer: Self-pay | Attending: Emergency Medicine | Admitting: Emergency Medicine

## 2018-01-12 ENCOUNTER — Encounter (HOSPITAL_COMMUNITY): Payer: Self-pay | Admitting: Emergency Medicine

## 2018-01-12 ENCOUNTER — Other Ambulatory Visit: Payer: Self-pay

## 2018-01-12 DIAGNOSIS — J45901 Unspecified asthma with (acute) exacerbation: Secondary | ICD-10-CM | POA: Insufficient documentation

## 2018-01-12 DIAGNOSIS — Z79899 Other long term (current) drug therapy: Secondary | ICD-10-CM | POA: Insufficient documentation

## 2018-01-12 DIAGNOSIS — Z87891 Personal history of nicotine dependence: Secondary | ICD-10-CM | POA: Insufficient documentation

## 2018-01-12 MED ORDER — IPRATROPIUM-ALBUTEROL 0.5-2.5 (3) MG/3ML IN SOLN
3.0000 mL | Freq: Once | RESPIRATORY_TRACT | Status: AC
  Administered 2018-01-12: 3 mL via RESPIRATORY_TRACT
  Filled 2018-01-12: qty 3

## 2018-01-12 MED ORDER — PREDNISONE 20 MG PO TABS: ORAL_TABLET | ORAL | 0 refills | Status: DC

## 2018-01-12 MED ORDER — LORATADINE 10 MG PO TABS: 10.0000 mg | ORAL_TABLET | Freq: Every day | ORAL | 0 refills | Status: DC

## 2018-01-12 MED ORDER — PREDNISONE 20 MG PO TABS
60.0000 mg | ORAL_TABLET | Freq: Once | ORAL | Status: AC
  Administered 2018-01-12: 60 mg via ORAL
  Filled 2018-01-12: qty 3

## 2018-01-12 MED ORDER — LORATADINE 10 MG PO TABS
10.0000 mg | ORAL_TABLET | Freq: Once | ORAL | Status: AC
  Administered 2018-01-12: 10 mg via ORAL
  Filled 2018-01-12: qty 1

## 2018-01-12 NOTE — ED Provider Notes (Signed)
[facility] EMERGENCY DEPARTMENT Provider Note   CSN: [id number] Arrival date & time: 01/12/18  0400     History   Chief Complaint Chief Complaint  Patient presents with  . Asthma    HPI Wendy Morgan is a 42 y.o. female.  The history is provided by the patient.  Wheezing   This is a recurrent problem. The current episode started more than 2 days ago. The problem occurs constantly. The problem has not changed since onset.Associated symptoms include cough. Pertinent negatives include no chest pain, no fever, no abdominal pain, no vomiting, no dysuria, no coryza, no ear pain, no headaches, no rhinorrhea, no sore throat, no swollen glands, no neck pain, no hemoptysis, no sputum production and no rash. It is unknown what precipitated the problem. She has tried beta-agonist inhalers for the symptoms. The treatment provided no relief. She has had no prior hospitalizations. She has had prior ED visits. She has had no prior ICU admissions. Her past medical history is significant for asthma.    Past Medical History:  Diagnosis Date  . Asthma   . Bronchiectasis with acute exacerbation (HCC)     There are no active problems to display for this patient.   History reviewed. No pertinent surgical history.   OB History   None      Home Medications    Prior to Admission medications   Medication Sig Start Date End Date Taking? Authorizing Provider  albuterol (PROVENTIL HFA;VENTOLIN HFA) 108 (90 Base) MCG/ACT inhaler Inhale 1-2 puffs into the lungs every 6 (six) hours as needed for wheezing. 01/02/18   Garlon Hatchet, PA-C  albuterol (PROVENTIL) (5 MG/ML) 0.5% nebulizer solution Take 1 mL (5 mg total) by nebulization every 6 (six) hours as needed for wheezing or shortness of breath. 01/02/18   Garlon Hatchet, PA-C  azithromycin (ZITHROMAX Z-PAK) 250 MG tablet Take as directed Patient not taking: Reported on 12/25/2017 10/09/17   Frederica Kuster, MD  beclomethasone  (QVAR REDIHALER) 40 MCG/ACT inhaler Inhale 1 puff into the lungs 2 (two) times daily. 10/17/17   Wurst, [place], PA-C  fluticasone (FLONASE) 50 MCG/ACT nasal spray Place 2 sprays into both nostrils daily. Patient not taking: Reported on 09/12/2017 04/26/17   Belinda Fisher, PA-C  Fluticasone-Salmeterol (ADVAIR DISKUS) 250-50 MCG/DOSE AEPB Inhale 1 puff into the lungs 2 (two) times daily. Patient not taking: Reported on 12/25/2017 06/26/17   Jaynie Crumble, PA-C  ipratropium (ATROVENT HFA) 17 MCG/ACT inhaler Inhale 2 puffs into the lungs every 6 (six) hours as needed for wheezing. Patient not taking: Reported on 09/12/2017 10/11/16   Bethel Born, PA-C  ipratropium (ATROVENT) 0.06 % nasal spray Place 2 sprays into both nostrils 4 (four) times daily. Patient not taking: Reported on 09/12/2017 05/06/17   Belinda Fisher, PA-C  montelukast (SINGULAIR) 10 MG tablet Take 1 tablet (10 mg total) by mouth at bedtime. 01/09/18   Antony Madura, PA-C  predniSONE (STERAPRED UNI-PAK 21 TAB) 10 MG (21) TBPK tablet Take by mouth daily. Take as directed. 12/25/17   Mardella Layman, MD    Family History No family history on file.  Social History Social History   Tobacco Use  . Smoking status: Former Smoker    Last attempt to quit: 1998    Years since quitting: 21.7  . Smokeless tobacco: Never Used  Substance Use Topics  . Alcohol use: No  . Drug use: No     Allergies   Patient has no  known allergies.   Review of Systems Review of Systems  Constitutional: Negative for fever.  HENT: Negative for ear pain, rhinorrhea and sore throat.   Eyes: Negative for visual disturbance.  Respiratory: Positive for cough and wheezing. Negative for hemoptysis, sputum production and shortness of breath.   Cardiovascular: Negative for chest pain, palpitations and leg swelling.  Gastrointestinal: Negative for abdominal pain and vomiting.  Genitourinary: Negative for dysuria.  Musculoskeletal: Negative for neck pain.  Skin:  Negative for rash.  Neurological: Negative for headaches.  Hematological: Negative for adenopathy.  All other systems reviewed and are negative.    Physical Exam Updated Vital Signs BP 116/83 (BP Location: Right Arm)   Pulse 71   Temp 98.2 F (36.8 C) (Oral)   Resp 18   SpO2 96%   Physical Exam  Constitutional: She is oriented to person, place, and time. She appears well-developed and well-nourished. No distress.  HENT:  Head: Normocephalic and atraumatic.  Eyes: Pupils are equal, round, and reactive to light. Conjunctivae and EOM are normal.  Neck: Normal range of motion. Neck supple.  Cardiovascular: Normal rate, regular rhythm, normal heart sounds and intact distal pulses.  Pulmonary/Chest: Effort normal. She has wheezes.  Abdominal: Soft. Bowel sounds are normal. She exhibits no mass. There is no tenderness. There is no rebound and no guarding.  Musculoskeletal: Normal range of motion. She exhibits no edema or tenderness.  Neurological: She is alert and oriented to person, place, and time. She displays normal reflexes.  Skin: Skin is warm and dry. Capillary refill takes less than 2 seconds.  Psychiatric: She has a normal mood and affect.  Nursing note and vitals reviewed.    ED Treatments / Results    Procedures Procedures (including critical care time)  Medications Ordered in ED Medications  loratadine (CLARITIN) tablet 10 mg (10 mg Oral Given 01/12/18 0518)  predniSONE (DELTASONE) tablet 60 mg (60 mg Oral Given 01/12/18 0518)  ipratropium-albuterol (DUONEB) 0.5-2.5 (3) MG/3ML nebulizer solution 3 mL (3 mLs Nebulization Given 01/12/18 0519)       Final Clinical Impressions(s) / ED Diagnoses   Patient has been seen in the ED many times this month, she has not followed up with a doctor.  She is only using her inhaler 2 times daily which is not often enough.     Return for weakness, numbness, changes in vision or speech, fevers >100.4 unrelieved by medication,  shortness of breath, intractable vomiting, or diarrhea, abdominal pain, Inability to tolerate liquids or food, cough, altered mental status or any concerns. No signs of systemic illness or infection. The patient is nontoxic-appearing on exam and vital signs are within normal limits.    I have reviewed the triage vital signs and the nursing notes. Pertinent labs &imaging results that were available during my care of the patient were reviewed by me and considered in my medical decision making (see chart for details).  After history, exam, and medical workup I feel the patient has been appropriately medically screened and is safe for discharge home. Pertinent diagnoses were discussed with the patient. Patient was given return precautions.    Lemond Griffee, MD 01/12/18 [phone]

## 2018-01-12 NOTE — ED Triage Notes (Signed)
C/o waking up this morning with asthma flare-up.  Last used albuterol inhaler around 9pm.  Reports productive cough with clear sputum.  Pt has been seen in ED multiple times this month for same.

## 2018-01-29 ENCOUNTER — Other Ambulatory Visit: Payer: Self-pay

## 2018-01-29 ENCOUNTER — Encounter (HOSPITAL_COMMUNITY): Payer: Self-pay | Admitting: *Deleted

## 2018-01-29 ENCOUNTER — Emergency Department (HOSPITAL_COMMUNITY)
Admission: EM | Admit: 2018-01-29 | Discharge: 2018-01-30 | Disposition: A | Discharge status: Home / Self Care | Payer: Self-pay | Attending: Emergency Medicine | Admitting: Emergency Medicine

## 2018-01-29 ENCOUNTER — Emergency Department (HOSPITAL_COMMUNITY): Payer: Self-pay

## 2018-01-29 DIAGNOSIS — J189 Pneumonia, unspecified organism: Secondary | ICD-10-CM | POA: Insufficient documentation

## 2018-01-29 DIAGNOSIS — J4541 Moderate persistent asthma with (acute) exacerbation: Secondary | ICD-10-CM | POA: Insufficient documentation

## 2018-01-29 DIAGNOSIS — Z87891 Personal history of nicotine dependence: Secondary | ICD-10-CM | POA: Insufficient documentation

## 2018-01-29 DIAGNOSIS — J181 Lobar pneumonia, unspecified organism: Secondary | ICD-10-CM

## 2018-01-29 LAB — BASIC METABOLIC PANEL
Anion gap: 7 (ref 5–15)
BUN: 9 mg/dL (ref 6–20)
CHLORIDE: 105 mmol/L (ref 98–111)
CO2: 27 mmol/L (ref 22–32)
Calcium: 9 mg/dL (ref 8.9–10.3)
Creatinine, Ser: 0.85 mg/dL (ref 0.44–1.00)
GFR calc Af Amer: >60 mL/min (ref >60)
GFR calc non Af Amer: >60 mL/min (ref >60)
Glucose, Bld: 98 mg/dL (ref 70–99)
POTASSIUM: 3.1 mmol/L — AB (ref 3.5–5.1)
SODIUM: 139 mmol/L (ref 135–145)

## 2018-01-29 LAB — CBC WITH DIFFERENTIAL/PLATELET
ABS IMMATURE GRANULOCYTES: 0.07 10*3/uL (ref 0.00–0.07)
Basophils Absolute: 0.1 10*3/uL (ref 0.0–0.1)
Basophils Relative: 1 %
Eosinophils Absolute: 0.6 10*3/uL — ABNORMAL HIGH (ref 0.0–0.5)
Eosinophils Relative: 4 %
HEMATOCRIT: 42.6 % (ref 36.0–46.0)
HEMOGLOBIN: 13.9 g/dL (ref 12.0–15.0)
Immature Granulocytes: 1 %
LYMPHS ABS: 3.2 10*3/uL (ref 0.7–4.0)
LYMPHS PCT: 23 %
MCH: 29.2 pg (ref 26.0–34.0)
MCHC: 32.6 g/dL (ref 30.0–36.0)
MCV: 89.5 fL (ref 80.0–100.0)
MONO ABS: 1.1 10*3/uL — AB (ref 0.1–1.0)
MONOS PCT: 8 %
NEUTROS ABS: 9 10*3/uL — AB (ref 1.7–7.7)
Neutrophils Relative %: 63 %
Platelets: 373 10*3/uL (ref 150–400)
RBC: 4.76 MIL/uL (ref 3.87–5.11)
RDW: 13.5 % (ref 11.5–15.5)
WBC: 14 10*3/uL — AB (ref 4.0–10.5)
nRBC: 0 % (ref 0.0–0.2)

## 2018-01-29 LAB — I-STAT TROPONIN, ED: TROPONIN I, POC: 0 ng/mL (ref 0.00–0.08)

## 2018-01-29 MED ORDER — PREDNISONE 20 MG PO TABS
60.0000 mg | ORAL_TABLET | Freq: Once | ORAL | Status: AC
  Administered 2018-01-29: 60 mg via ORAL
  Filled 2018-01-29: qty 3

## 2018-01-29 MED ORDER — ALBUTEROL SULFATE (2.5 MG/3ML) 0.083% IN NEBU
5.0000 mg | INHALATION_SOLUTION | Freq: Once | RESPIRATORY_TRACT | Status: AC
  Administered 2018-01-29: 5 mg via RESPIRATORY_TRACT
  Filled 2018-01-29: qty 6

## 2018-01-29 MED ORDER — IPRATROPIUM-ALBUTEROL 0.5-2.5 (3) MG/3ML IN SOLN
3.0000 mL | Freq: Once | RESPIRATORY_TRACT | Status: AC
  Administered 2018-01-29: 3 mL via RESPIRATORY_TRACT
  Filled 2018-01-29: qty 3

## 2018-01-29 NOTE — ED Provider Notes (Signed)
[facility] EMERGENCY DEPARTMENT Provider Note   CSN: [id number] Arrival date & time: 01/29/18  2041     History   Chief Complaint Chief Complaint  Patient presents with  . Asthma    HPI Wendy Morgan is a 42 y.o. female with history of asthma is here for evaluation of cough.  Onset 2 days ago, sudden onset, gradually worsening, dry.  Has noticed some rattling in her chest with breathing.  Other associated symptoms include chest tightness, shortness of breath at rest and with exertion, generalized weakness.  Has been having to use her albuterol inhaler more frequently without significant relief.  No sick contacts.  She denies associated fevers, sore throat, congestion, rhinorrhea, exertional chest pain, nausea, vomiting, abdominal pain, changes in bowel movements.  Denies tobacco use.  No use of vaping products.  No known cardiac history, hypertension, hyperlipidemia, diabetes, illicit drug use.  No known family history of CAD.  No history of DVT/PE, recent travel or immobilization, surgery, estrogen use, LE edema or calf tenderness.   HPI  Past Medical History:  Diagnosis Date  . Asthma   . Bronchiectasis with acute exacerbation (HCC)     There are no active problems to display for this patient.   History reviewed. No pertinent surgical history.   OB History   None      Home Medications    Prior to Admission medications   Medication Sig Start Date End Date Taking? Authorizing Provider  albuterol (PROVENTIL HFA;VENTOLIN HFA) 108 (90 Base) MCG/ACT inhaler Inhale 1-2 puffs into the lungs every 6 (six) hours as needed for wheezing. 01/02/18   Garlon Hatchet, PA-C  albuterol (PROVENTIL) (5 MG/ML) 0.5% nebulizer solution Take 1 mL (5 mg total) by nebulization every 6 (six) hours as needed for wheezing or shortness of breath. 01/02/18   Garlon Hatchet, PA-C  azithromycin (ZITHROMAX Z-PAK) 250 MG tablet Take as directed Patient not taking: Reported on  12/25/2017 10/09/17   Frederica Kuster, MD  beclomethasone (QVAR REDIHALER) 40 MCG/ACT inhaler Inhale 1 puff into the lungs 2 (two) times daily. 10/17/17   Wurst, [place], PA-C  fluticasone (FLONASE) 50 MCG/ACT nasal spray Place 2 sprays into both nostrils daily. Patient not taking: Reported on 09/12/2017 04/26/17   Belinda Fisher, PA-C  Fluticasone-Salmeterol (ADVAIR DISKUS) 250-50 MCG/DOSE AEPB Inhale 1 puff into the lungs 2 (two) times daily. Patient not taking: Reported on 12/25/2017 06/26/17   Jaynie Crumble, PA-C  ipratropium (ATROVENT HFA) 17 MCG/ACT inhaler Inhale 2 puffs into the lungs every 6 (six) hours as needed for wheezing. Patient not taking: Reported on 09/12/2017 10/11/16   Bethel Born, PA-C  ipratropium (ATROVENT) 0.06 % nasal spray Place 2 sprays into both nostrils 4 (four) times daily. Patient not taking: Reported on 09/12/2017 05/06/17   Belinda Fisher, PA-C  loratadine (CLARITIN) 10 MG tablet Take 1 tablet (10 mg total) by mouth daily. 01/12/18   Palumbo, April, MD  montelukast (SINGULAIR) 10 MG tablet Take 1 tablet (10 mg total) by mouth at bedtime. 01/09/18   Antony Madura, PA-C  predniSONE (DELTASONE) 20 MG tablet 3 tabs po day one, then 2 po daily x 4 days 01/12/18   Palumbo, April, MD  predniSONE (STERAPRED UNI-PAK 21 TAB) 10 MG (21) TBPK tablet Take by mouth daily. Take as directed. 12/25/17   Mardella Layman, MD    Family History No family history on file.  Social History Social History   Tobacco Use  . Smoking  status: Former Smoker    Last attempt to quit: 1998    Years since quitting: 21.8  . Smokeless tobacco: Never Used  Substance Use Topics  . Alcohol use: No  . Drug use: No     Allergies   Patient has no known allergies.   Review of Systems Review of Systems  Constitutional: Positive for fatigue.  Respiratory: Positive for cough, chest tightness, shortness of breath and wheezing.   All other systems reviewed and are negative.    Physical Exam Updated  Vital Signs BP 125/75   Pulse 90   Temp 98.9 F (37.2 C) (Oral)   Resp (!) 24   LMP 01/15/2018   SpO2 95%   Physical Exam  Constitutional: She is oriented to person, place, and time. She appears well-developed and well-nourished. No distress.  NAD.  HENT:  Head: Normocephalic and atraumatic.  Right Ear: External ear normal.  Left Ear: External ear normal.  Nose: Nose normal.  Eyes: Conjunctivae and EOM are normal.  Neck: Normal range of motion. Neck supple.  Cardiovascular: Normal rate, regular rhythm and normal heart sounds.  No lower extremity edema or calf tenderness.  Pulmonary/Chest: Effort normal. She has wheezes.  Inspiratory and expiratory wheezing in all lung fields, most significant in upper lobes.  Frequent coughing throughout exam.  Normal work of breathing, she is speaking in full sentences.  Abdominal: Soft. There is no tenderness.  Musculoskeletal: Normal range of motion. She exhibits no deformity.  Neurological: She is alert and oriented to person, place, and time.  Skin: Skin is warm and dry. Capillary refill takes less than 2 seconds.  Psychiatric: She has a normal mood and affect. Her behavior is normal. Judgment and thought content normal.  Nursing note and vitals reviewed.    ED Treatments / Results  Labs (all labs ordered are listed, but only abnormal results are displayed) Labs Reviewed  CBC WITH DIFFERENTIAL/PLATELET - Abnormal; Notable for the following components:      Result Value   WBC 14.0 (*)    Neutro Abs 9.0 (*)    Monocytes Absolute 1.1 (*)    Eosinophils Absolute 0.6 (*)    All other components within normal limits  BASIC METABOLIC PANEL - Abnormal; Notable for the following components:   Potassium 3.1 (*)    All other components within normal limits  I-STAT TROPONIN, ED    EKG None  Radiology Dg Chest 2 View  Result Date: 01/29/2018 CLINICAL DATA:  Cough and wheezing EXAM: CHEST - 2 VIEW COMPARISON:  12/25/2017, 09/12/2017  FINDINGS: Streaky anterior lung opacity on lateral view, likely right middle lobe. No pleural effusion. Normal heart size. No pneumothorax IMPRESSION: Streaky atelectasis or minimal infiltrate, likely right middle lobe. Electronically Signed   By: Jasmine Pang M.D.   On: 01/29/2018 22:36    Procedures Procedures (including critical care time)  Medications Ordered in ED Medications  magnesium sulfate IVPB 2 g 50 mL (has no administration in time range)  albuterol (PROVENTIL,VENTOLIN) solution continuous neb (10 mg/hr Nebulization Given 01/30/18 0054)  cefTRIAXone (ROCEPHIN) 1 g in sodium chloride 0.9 % 100 mL IVPB (has no administration in time range)  azithromycin (ZITHROMAX) 500 mg in sodium chloride 0.9 % 250 mL IVPB (has no administration in time range)  albuterol (PROVENTIL) (2.5 MG/3ML) 0.083% nebulizer solution 5 mg (5 mg Nebulization Given 01/29/18 2051)  ipratropium-albuterol (DUONEB) 0.5-2.5 (3) MG/3ML nebulizer solution 3 mL (3 mLs Nebulization Given 01/29/18 2330)  predniSONE (DELTASONE) tablet 60 mg (60  mg Oral Given 01/29/18 2330)  ipratropium (ATROVENT) nebulizer solution 0.5 mg (0.5 mg Nebulization Given 01/30/18 0055)     Initial Impression / Assessment and Plan / ED Course  I have reviewed the triage vital signs and the nursing notes.  Pertinent labs & imaging results that were available during my care of the patient were reviewed by me and considered in my medical decision making (see chart for details).  Clinical Course as of Jan 30 57  Tue Jan 29, 2018  2329 WBC(!): 14.0 [CG]  2329 Pulse Rate(!): 105 [CG]  Wed Jan 30, 2018  0023 I reevaluated patient after prednisone and DuoNeb x1.  She does not feel any better.  She continues to have diffuse wheezing with inspiration and expiration throughout.  During conversation her SPO2 drops to 92%.  Discussed infiltrate noted on chest x-ray.  Will give 1 hour breathing treatment, magnesium, attempt ambulation to see if her  oxygen levels dropped.   [CG]  0036 Patient ambulated to the bathroom, her SPO2 dropped to 92%, she reported shortness of breath.   [CG]  [id number] Reevaluated patient prior to sign off.  States she is feeling worse after trying to walk.  There is audible wheezing.  RT at bedside to start breathing treatment.  Evaluated   [CG]    Clinical Course User Index [CG] Liberty Handy, PA-C   -Differential is asthma exacerbation versus pneumonia.  Cardiac ischemia and PE considered to be less likely.  She has no risk factors for PE.  No known cardiac history or cardiac risk factors.  She is tachypneic but without obvious signs of respiratory distress.   0010: Chest x-ray shows infiltrate.  WBC 14.0.  Afebrile.  Tachycardia has resolved.  K3.1.  Troponin is undetectable.  EKGs without ST E/D.    1610: Patient not feeling better after prednisone and DuoNeb x1.  Remains tachypneic.  Ambulated and felt short of breath.  There is audible wheezing.  RT at bedside is starting DuoNeb.  IV magnesium and antibiotics ordered.  Patient handed off to oncoming ED PA who will reassess.  If there is significant clinical improvement may be discharged with antibiotics, albuterol.  CURB/PSI score low however if symptoms refractory consider admission for asthma exacerbation. Pt has never required hospitalization or intubation in past.  Final Clinical Impressions(s) / ED Diagnoses   Final diagnoses:  Moderate persistent asthma with exacerbation  Community acquired pneumonia of right middle lobe of lung [facility])    ED Discharge Orders    None       Jerrell Mylar 01/30/18 [id number]    Marily Memos, MD 01/31/18 [phone]

## 2018-01-29 NOTE — ED Triage Notes (Signed)
Pt has been having issues with her asthma for the past 2 days, worse today. Exp wheezing noted, has been using inhaler without relief

## 2018-01-30 MED ORDER — IPRATROPIUM BROMIDE 0.02 % IN SOLN
0.5000 mg | Freq: Once | RESPIRATORY_TRACT | Status: AC
  Administered 2018-01-30: 0.5 mg via RESPIRATORY_TRACT
  Filled 2018-01-30: qty 2.5

## 2018-01-30 MED ORDER — SODIUM CHLORIDE 0.9 % IV SOLN
500.0000 mg | Freq: Once | INTRAVENOUS | Status: AC
  Administered 2018-01-30: 500 mg via INTRAVENOUS
  Filled 2018-01-30: qty 500

## 2018-01-30 MED ORDER — AZITHROMYCIN 250 MG PO TABS: 250.0000 mg | ORAL_TABLET | Freq: Every day | ORAL | 0 refills | Status: DC

## 2018-01-30 MED ORDER — PREDNISONE 20 MG PO TABS: ORAL_TABLET | ORAL | 0 refills | Status: DC

## 2018-01-30 MED ORDER — ALBUTEROL (5 MG/ML) CONTINUOUS INHALATION SOLN
10.0000 mg/h | INHALATION_SOLUTION | Freq: Once | RESPIRATORY_TRACT | Status: AC
  Administered 2018-01-30: 10 mg/h via RESPIRATORY_TRACT
  Filled 2018-01-30: qty 20

## 2018-01-30 MED ORDER — MAGNESIUM SULFATE 2 GM/50ML IV SOLN
2.0000 g | Freq: Once | INTRAVENOUS | Status: AC
  Administered 2018-01-30: 2 g via INTRAVENOUS
  Filled 2018-01-30: qty 50

## 2018-01-30 MED ORDER — ALBUTEROL SULFATE (2.5 MG/3ML) 0.083% IN NEBU: 2.5000 mg | INHALATION_SOLUTION | RESPIRATORY_TRACT | Status: DC

## 2018-01-30 MED ORDER — SODIUM CHLORIDE 0.9 % IV SOLN
1.0000 g | Freq: Once | INTRAVENOUS | Status: AC
  Administered 2018-01-30: 1 g via INTRAVENOUS
  Filled 2018-01-30: qty 10

## 2018-01-30 NOTE — ED Notes (Signed)
Walked pt, O2 reading between 94%-97%. Pt stated breathing feels easier than before.

## 2018-01-30 NOTE — ED Provider Notes (Signed)
Assumed care from PA Gibbons at shift change.  See prior note for full H&P.  Briefly 42 year old female here with cough and worsening shortness of breath.  Has history of asthma.  Found to have pneumonia here.  Had prednisone and albuterol neb without much improvement.  She was ambulated with pulse ox and became hypoxic and extremely symptomatic with worsening wheezing.    Plan: IV antibiotic started.  Hour-long neb with IV magnesium given.  Will need reassessment.  Results for orders placed or performed during the hospital encounter of 01/29/18  CBC with Differential  Result Value Ref Range   WBC 14.0 (H) 4.0 - 10.5 K/uL   RBC 4.76 3.87 - 5.11 MIL/uL   Hemoglobin 13.9 12.0 - 15.0 g/dL   HCT 04.5 40.9 - 81.1 %   MCV 89.5 80.0 - 100.0 fL   MCH 29.2 26.0 - 34.0 pg   MCHC 32.6 30.0 - 36.0 g/dL   RDW 91.4 78.2 - 95.6 %   Platelets 373 150 - 400 K/uL   nRBC 0.0 0.0 - 0.2 %   Neutrophils Relative % 63 %   Neutro Abs 9.0 (H) 1.7 - 7.7 K/uL   Lymphocytes Relative 23 %   Lymphs Abs 3.2 0.7 - 4.0 K/uL   Monocytes Relative 8 %   Monocytes Absolute 1.1 (H) 0.1 - 1.0 K/uL   Eosinophils Relative 4 %   Eosinophils Absolute 0.6 (H) 0.0 - 0.5 K/uL   Basophils Relative 1 %   Basophils Absolute 0.1 0.0 - 0.1 K/uL   Immature Granulocytes 1 %   Abs Immature Granulocytes 0.07 0.00 - 0.07 K/uL  Basic metabolic panel  Result Value Ref Range   Sodium 139 135 - 145 mmol/L   Potassium 3.1 (L) 3.5 - 5.1 mmol/L   Chloride 105 98 - 111 mmol/L   CO2 27 22 - 32 mmol/L   Glucose, Bld 98 70 - 99 mg/dL   BUN 9 6 - 20 mg/dL   Creatinine, Ser 2.13 0.44 - 1.00 mg/dL   Calcium 9.0 8.9 - 08.6 mg/dL   GFR calc non Af Amer >60 >60 mL/min   GFR calc Af Amer >60 >60 mL/min   Anion gap 7 5 - 15  I-Stat Troponin, ED (not at [facility])  Result Value Ref Range   Troponin i, poc 0.00 0.00 - 0.08 ng/mL   Comment 3           Dg Chest 2 View  Result Date: 01/29/2018 CLINICAL DATA:  Cough and wheezing EXAM: CHEST - 2 VIEW  COMPARISON:  12/25/2017, 09/12/2017 FINDINGS: Streaky anterior lung opacity on lateral view, likely right middle lobe. No pleural effusion. Normal heart size. No pneumothorax IMPRESSION: Streaky atelectasis or minimal infiltrate, likely right middle lobe. Electronically Signed   By: Jasmine Pang M.D.   On: 01/29/2018 22:36     2:46 AM Patient re-checked.  Has finished hour long neb, states she is feeling somewhat better but not back to baseline.  O2 sats 97% during assessment.  Continues to have some expiratory wheezes.  Will have her ambulate with pulse ox again and see how she does this time (on prior temp she dropped down to 92% and got extremely symptomatic).    Patient ambulated with pulse ox, able to maintain 94-97%.  States she felt much better ambulating this time.  On return to room, O2 sats remained stable in high 90's.  She overall sounds improved.   Patient wants to try to go home-- feel  this is reasonable.  Will d/c home with abx, steroids.  Recommended scheduled nebs over the next 24-48 hours Q4-6H.  She can follow-up with PCP.  Return here for any new/acute changes.   Garlon Hatchet, PA-C 01/30/18 1610    Shon Baton, MD 01/30/18 [phone]

## 2018-01-30 NOTE — Discharge Instructions (Signed)
Take the prescribed medication as directed. Recommended scheduled nebs every 4-6 hours over the next 24-48 hours. Follow-up with your primary care doctor. Return to the ED for new or worsening symptoms-- worsening shortness of breath, chest pain, high fevers, etc.

## 2018-01-30 NOTE — ED Notes (Signed)
Patient reassess, no audible wheezing noted. patient states "I feel much better" patient states her breathing is much better. no increase WOB/SOB noted. BBS to auscultation reveals fine mild wheezing with decrease aeration throughout. No respiratory compromise noted. SATs are stable on RA

## 2018-01-30 NOTE — ED Notes (Signed)
Walked pt, pt O2 dropped down to 92%, pt stated that "I feel like I cant breath". Pt was assisted back to the room and placed back on monitor

## 2018-02-11 ENCOUNTER — Other Ambulatory Visit: Payer: Self-pay

## 2018-02-11 ENCOUNTER — Encounter (HOSPITAL_COMMUNITY): Payer: Self-pay | Admitting: *Deleted

## 2018-02-11 ENCOUNTER — Emergency Department (HOSPITAL_COMMUNITY): Payer: Self-pay

## 2018-02-11 ENCOUNTER — Emergency Department (HOSPITAL_COMMUNITY)
Admission: EM | Admit: 2018-02-11 | Discharge: 2018-02-11 | Disposition: A | Discharge status: Home / Self Care | Payer: Self-pay | Attending: Emergency Medicine | Admitting: Emergency Medicine

## 2018-02-11 DIAGNOSIS — J069 Acute upper respiratory infection, unspecified: Secondary | ICD-10-CM | POA: Insufficient documentation

## 2018-02-11 DIAGNOSIS — Z87891 Personal history of nicotine dependence: Secondary | ICD-10-CM | POA: Insufficient documentation

## 2018-02-11 DIAGNOSIS — J45909 Unspecified asthma, uncomplicated: Secondary | ICD-10-CM | POA: Insufficient documentation

## 2018-02-11 DIAGNOSIS — Z79899 Other long term (current) drug therapy: Secondary | ICD-10-CM | POA: Insufficient documentation

## 2018-02-11 DIAGNOSIS — R0602 Shortness of breath: Secondary | ICD-10-CM

## 2018-02-11 MED ORDER — PREDNISONE 20 MG PO TABS
60.0000 mg | ORAL_TABLET | Freq: Once | ORAL | Status: AC
  Administered 2018-02-11: 60 mg via ORAL
  Filled 2018-02-11: qty 3

## 2018-02-11 MED ORDER — DM-GUAIFENESIN ER 30-600 MG PO TB12
1.0000 | ORAL_TABLET | Freq: Once | ORAL | Status: AC
  Administered 2018-02-11: 1 via ORAL
  Filled 2018-02-11: qty 1

## 2018-02-11 MED ORDER — IPRATROPIUM-ALBUTEROL 0.5-2.5 (3) MG/3ML IN SOLN
3.0000 mL | Freq: Once | RESPIRATORY_TRACT | Status: AC
  Administered 2018-02-11: 3 mL via RESPIRATORY_TRACT
  Filled 2018-02-11: qty 3

## 2018-02-11 MED ORDER — IPRATROPIUM-ALBUTEROL 0.5-2.5 (3) MG/3ML IN SOLN
3.0000 mL | RESPIRATORY_TRACT | Status: DC
  Administered 2018-02-11: 3 mL via RESPIRATORY_TRACT
  Filled 2018-02-11: qty 3

## 2018-02-11 MED ORDER — ALBUTEROL SULFATE (2.5 MG/3ML) 0.083% IN NEBU
5.0000 mg | INHALATION_SOLUTION | Freq: Once | RESPIRATORY_TRACT | Status: AC
  Administered 2018-02-11: 5 mg via RESPIRATORY_TRACT
  Filled 2018-02-11: qty 6

## 2018-02-11 NOTE — ED Notes (Signed)
Patient transported to X-ray 

## 2018-02-11 NOTE — ED Notes (Signed)
Pt called X 2 for room assignment. No answer. Will try again.

## 2018-02-11 NOTE — ED Provider Notes (Signed)
[facility] EMERGENCY DEPARTMENT Provider Note   CSN: [id number] Arrival date & time: 02/11/18  7425     History   Chief Complaint Chief Complaint  Patient presents with  . Shortness of Breath    HPI Wendy Morgan is a 42 y.o. female with medical history of asthma and recently treated for community acquired pneumonia with prednisone, IV ceftriaxone and azithromycin presenting today with 2-day history of nasal congestion, rhinorrhea productive of green sputum, sensation of congestion in the chest, and cough productive of green sputum as well as shortness of breath.  She denies fevers, chills, chest pain, pleuritis, nausea, vomiting.  She does state that for the past 2 days she has had to increase the frequency of her home albuterol inhaler.  She denies any sick contacts and reports compliance with previously prescribed antibiotics.    Past Medical History:  Diagnosis Date  . Asthma   . Bronchiectasis with acute exacerbation (HCC)     There are no active problems to display for this patient.   History reviewed. No pertinent surgical history.   OB History   None      Home Medications    Prior to Admission medications   Medication Sig Start Date End Date Taking? Authorizing Provider  albuterol (PROVENTIL HFA;VENTOLIN HFA) 108 (90 Base) MCG/ACT inhaler Inhale 1-2 puffs into the lungs every 6 (six) hours as needed for wheezing. 01/02/18   Garlon Hatchet, PA-C  albuterol (PROVENTIL) (5 MG/ML) 0.5% nebulizer solution Take 1 mL (5 mg total) by nebulization every 6 (six) hours as needed for wheezing or shortness of breath. 01/02/18   Garlon Hatchet, PA-C  azithromycin (ZITHROMAX) 250 MG tablet Take 1 tablet (250 mg total) by mouth daily. Take first 2 tablets together, then 1 every day until finished. 01/30/18   Garlon Hatchet, PA-C  beclomethasone (QVAR REDIHALER) 40 MCG/ACT inhaler Inhale 1 puff into the lungs 2 (two) times daily. 10/17/17   Wurst, [place],  PA-C  fluticasone (FLONASE) 50 MCG/ACT nasal spray Place 2 sprays into both nostrils daily. Patient not taking: Reported on 09/12/2017 04/26/17   Belinda Fisher, PA-C  Fluticasone-Salmeterol (ADVAIR DISKUS) 250-50 MCG/DOSE AEPB Inhale 1 puff into the lungs 2 (two) times daily. Patient not taking: Reported on 12/25/2017 06/26/17   Jaynie Crumble, PA-C  ipratropium (ATROVENT HFA) 17 MCG/ACT inhaler Inhale 2 puffs into the lungs every 6 (six) hours as needed for wheezing. Patient not taking: Reported on 09/12/2017 10/11/16   Bethel Born, PA-C  ipratropium (ATROVENT) 0.06 % nasal spray Place 2 sprays into both nostrils 4 (four) times daily. Patient not taking: Reported on 09/12/2017 05/06/17   Belinda Fisher, PA-C  loratadine (CLARITIN) 10 MG tablet Take 1 tablet (10 mg total) by mouth daily. Patient not taking: Reported on 01/30/2018 01/12/18   Palumbo, April, MD  montelukast (SINGULAIR) 10 MG tablet Take 1 tablet (10 mg total) by mouth at bedtime. Patient not taking: Reported on 01/30/2018 01/09/18   Antony Madura, PA-C  predniSONE (DELTASONE) 20 MG tablet Take 40 mg by mouth daily for 3 days, then 20mg  by mouth daily for 3 days, then 10mg  daily for 3 days 01/30/18   Garlon Hatchet, PA-C    Family History No family history on file.  Social History Social History   Tobacco Use  . Smoking status: Former Smoker    Last attempt to quit: 1998    Years since quitting: 21.8  . Smokeless tobacco: Never Used  Substance Use Topics  . Alcohol use: No  . Drug use: No     Allergies   Patient has no known allergies.   Review of Systems Review of Systems  Constitutional: Negative for chills and fever.  HENT: Positive for congestion, rhinorrhea, sinus pressure and sinus pain. Negative for sore throat.   Respiratory: Positive for cough and shortness of breath. Negative for chest tightness and wheezing.   Cardiovascular: Negative.   Gastrointestinal: Negative.   Endocrine: Negative.     Musculoskeletal: Negative.   Skin: Negative.   Neurological: Positive for headaches.  Psychiatric/Behavioral: Negative.      Physical Exam Updated Vital Signs BP 118/84   Pulse 83   Temp 98.2 F (36.8 C) (Oral)   Resp 18   Ht 5\' 4"  (1.626 m)   Wt 81.6 kg   LMP 01/15/2018   SpO2 95%   BMI 30.90 kg/m   Physical Exam  Constitutional: She appears well-developed and well-nourished.  Non-toxic appearance. She does not appear ill. No distress.  Comfortably sitting on the bed, speaking full sentences  HENT:  Head: Normocephalic and atraumatic.  Neck: Neck supple.  Cardiovascular: Normal rate and normal heart sounds. Exam reveals no gallop.  No murmur heard. Pulmonary/Chest: Effort normal. No accessory muscle usage. No tachypnea. No respiratory distress. She has wheezes (expiratory). She has no rhonchi. She has no rales.  Abdominal: Soft. Bowel sounds are normal.  Neurological: She is alert.  Skin: Skin is warm.  Psychiatric: She has a normal mood and affect. Her behavior is normal.     ED Treatments / Results  Labs (all labs ordered are listed, but only abnormal results are displayed) Labs Reviewed - No data to display  EKG None  Radiology Dg Chest 2 View  Result Date: 02/11/2018 CLINICAL DATA:  42 year old female with a history chest congestion for 1 week EXAM: CHEST - 2 VIEW COMPARISON:  01/29/2018, 12/25/2017 FINDINGS: Cardiomediastinal silhouette unchanged in size and contour. No interlobular septal thickening. No pneumothorax or pleural effusion. No confluent airspace disease. No acute displaced fracture IMPRESSION: Negative for acute cardiopulmonary disease Electronically Signed   By: Gilmer Mor D.O.   On: 02/11/2018 07:52    Procedures Procedures (including critical care time)  Medications Ordered in ED Medications  albuterol (PROVENTIL) (2.5 MG/3ML) 0.083% nebulizer solution 5 mg (5 mg Nebulization Given 02/11/18 0722)  predniSONE (DELTASONE) tablet 60  mg (60 mg Oral Given 02/11/18 0821)  dextromethorphan-guaiFENesin (MUCINEX DM) 30-600 MG per 12 hr tablet 1 tablet (1 tablet Oral Given 02/11/18 0823)  ipratropium-albuterol (DUONEB) 0.5-2.5 (3) MG/3ML nebulizer solution 3 mL (3 mLs Nebulization Given 02/11/18 0830)  ipratropium-albuterol (DUONEB) 0.5-2.5 (3) MG/3ML nebulizer solution 3 mL (3 mLs Nebulization Given 02/11/18 0830)  ipratropium-albuterol (DUONEB) 0.5-2.5 (3) MG/3ML nebulizer solution 3 mL (3 mLs Nebulization Given 02/11/18 0830)     Initial Impression / Assessment and Plan / ED Course  I have reviewed the triage vital signs and the nursing notes.  Pertinent labs & imaging results that were available during my care of the patient were reviewed by me and considered in my medical decision making (see chart for details).   42 year old woman with history of asthma and recent diagnosis of community acquired pneumonia status post prednisone, IV ceftriaxone and azithromycin presenting with 2-day history of nasal and chest congestion, rhinorrhea and lacrimation.  Denies fevers, chills, nausea, vomiting or sick contacts.  Also reports of increased albuterol use recently.  On arrival, her vitals were within normal limits and  was saturating close to 97% on ambient air.  Physical exams with diffuse expiratory wheezes  Patient most likely has mild asthma exacerbation secondary to upper respiratory viral syndrome.  She received prednisone 60 mg, DuoNeb x3.  On reassessment, patient reports that she is doing significantly better after receiving the breathing treatment and prednisone.  I advised patient to obtain Mucinex for symptomatic relief of her URI and also keep yourself hydrated.  Final Clinical Impressions(s) / ED Diagnoses   Final diagnoses:  Upper respiratory infection, viral  SOB (shortness of breath)    ED Discharge Orders    None       Yvette Rack, MD 02/11/18 0905    Melene Plan, DO 02/11/18 0102

## 2018-02-11 NOTE — Discharge Instructions (Addendum)
Ms. Kessen,   Pleasure taking care of you here in the ED.  The reason why you are having shortness of breath is because your asthma was triggered by an upper respiratory viral infection.  We gave you breathing treatments here at the emergency department and he seems to have improved.  I will advised that you follow-up with your primary care doctor and continue using your albuterol as needed.  ~Take care Dr. Dortha Schwalbe

## 2018-02-11 NOTE — ED Notes (Signed)
Discharge instructions discussed with Pt. Pt verbalized understanding. Pt stable and ambulatory.    

## 2018-02-11 NOTE — ED Triage Notes (Signed)
States she was recently treated for pneumonia with antibiotics and prednisone c/o chest congestion and cough for 1 week. Wendy Morgan

## 2018-02-12 ENCOUNTER — Ambulatory Visit (HOSPITAL_COMMUNITY)
Admission: EM | Admit: 2018-02-12 | Discharge: 2018-02-12 | Disposition: A | Discharge status: Home / Self Care | Payer: Self-pay | Attending: Family Medicine | Admitting: Family Medicine

## 2018-02-12 ENCOUNTER — Encounter (HOSPITAL_COMMUNITY): Payer: Self-pay | Admitting: Emergency Medicine

## 2018-02-12 DIAGNOSIS — J4541 Moderate persistent asthma with (acute) exacerbation: Secondary | ICD-10-CM

## 2018-02-12 MED ORDER — IPRATROPIUM-ALBUTEROL 0.5-2.5 (3) MG/3ML IN SOLN
RESPIRATORY_TRACT | Status: AC
  Filled 2018-02-12: qty 3

## 2018-02-12 MED ORDER — METHYLPREDNISOLONE SODIUM SUCC 125 MG IJ SOLR
125.0000 mg | Freq: Once | INTRAMUSCULAR | Status: AC
  Administered 2018-02-12: 125 mg via INTRAMUSCULAR

## 2018-02-12 MED ORDER — IPRATROPIUM-ALBUTEROL 0.5-2.5 (3) MG/3ML IN SOLN
3.0000 mL | Freq: Once | RESPIRATORY_TRACT | Status: AC
  Administered 2018-02-12: 3 mL via RESPIRATORY_TRACT

## 2018-02-12 MED ORDER — FLUTICASONE PROPIONATE 50 MCG/ACT NA SUSP: 1.0000 | Freq: Every day | NASAL | 0 refills | Status: DC

## 2018-02-12 MED ORDER — PREDNISONE 10 MG (21) PO TBPK: ORAL_TABLET | ORAL | 0 refills | Status: DC

## 2018-02-12 MED ORDER — METHYLPREDNISOLONE SODIUM SUCC 125 MG IJ SOLR
INTRAMUSCULAR | Status: AC
  Filled 2018-02-12: qty 2

## 2018-02-12 MED ORDER — CETIRIZINE HCL 10 MG PO CAPS: 10.0000 mg | ORAL_CAPSULE | Freq: Every day | ORAL | 0 refills | Status: DC

## 2018-02-12 NOTE — ED Triage Notes (Addendum)
Asthma exacerbation for 3 days. Pt is using inhaler and neb at home. PT was seen in the ED yesterday

## 2018-02-12 NOTE — Discharge Instructions (Signed)
We gave you a shot of Solu-Medrol today, please begin steroid taper tomorrow-begin with 6 tablets on day 1 and decreased by 1 tablet each day until you take 1 tablet on day 6  Please begin daily Zyrtec and Flonase nasal spray to help with congestion drainage that may be worsening your asthma  Please establish care with a primary care and follow-up with an asthma specialist or pulmonology in order to have better control of your asthma  Please follow-up here in emergency room if developing worsening breathing, shortness of breath, chest discomfort, fevers

## 2018-02-13 NOTE — ED Provider Notes (Signed)
[facility]    CSN: [id number] Arrival date & time: 02/12/18  1745     History   Chief Complaint Chief Complaint  Patient presents with  . Asthma    HPI Wendy Morgan is a 42 y.o. female history of asthma presenting today for evaluation of asthma exacerbation.  Patient has been seen frequently over the past few months for asthma exacerbations.  Most recently she was seen on 10/15 and treated for pneumonia with antibiotics and steroids.  Patient notes that after she stopped the antibiotics and steroids her symptoms returned and started to develop a productive cough, wheezing and shortness of breath again.  Patient went to the emergency room yesterday for this.  Chest x-ray was negative for pneumonia.  Was provided with 3 breathing treatments and a one-time 60 mg dose of prednisone.  Patient states her symptoms slightly improved, but today her symptoms returned.  Patient only has albuterol inhaler.  She is not on daily maintenance therapy.  She has never seen an asthma specialist.  She denies fever.  She has noted that she feels cold symptoms have worsened her asthma of runny nose and drainage.  She denies sore throat.  She has used her albuterol inhaler more many times without relief.  HPI  Past Medical History:  Diagnosis Date  . Asthma   . Bronchiectasis with acute exacerbation (HCC)     There are no active problems to display for this patient.   History reviewed. No pertinent surgical history.  OB History   None      Home Medications    Prior to Admission medications   Medication Sig Start Date End Date Taking? Authorizing Provider  albuterol (PROVENTIL) (5 MG/ML) 0.5% nebulizer solution Take 1 mL (5 mg total) by nebulization every 6 (six) hours as needed for wheezing or shortness of breath. 01/02/18  Yes Garlon Hatchet, PA-C  albuterol (PROVENTIL HFA;VENTOLIN HFA) 108 (90 Base) MCG/ACT inhaler Inhale 1-2 puffs into the lungs every 6 (six) hours as needed  for wheezing. 01/02/18   Garlon Hatchet, PA-C  azithromycin (ZITHROMAX) 250 MG tablet Take 1 tablet (250 mg total) by mouth daily. Take first 2 tablets together, then 1 every day until finished. 01/30/18   Garlon Hatchet, PA-C  beclomethasone (QVAR REDIHALER) 40 MCG/ACT inhaler Inhale 1 puff into the lungs 2 (two) times daily. 10/17/17   Wurst, [place], PA-C  Cetirizine HCl 10 MG CAPS Take 1 capsule (10 mg total) by mouth daily. 02/12/18   Wieters, Hallie C, PA-C  fluticasone (FLONASE) 50 MCG/ACT nasal spray Place 1-2 sprays into both nostrils daily for 7 days. 02/12/18 02/19/18  Wieters, Hallie C, PA-C  predniSONE (STERAPRED UNI-PAK 21 TAB) 10 MG (21) TBPK tablet Take as directed 02/12/18   Wieters, Junius Creamer, PA-C    Family History No family history on file.  Social History Social History   Tobacco Use  . Smoking status: Former Smoker    Last attempt to quit: 1998    Years since quitting: 21.8  . Smokeless tobacco: Never Used  Substance Use Topics  . Alcohol use: No  . Drug use: No     Allergies   Patient has no known allergies.   Review of Systems Review of Systems  Constitutional: Negative for activity change, appetite change, chills, fatigue and fever.  HENT: Positive for congestion and rhinorrhea. Negative for ear pain, sinus pressure, sore throat and trouble swallowing.   Eyes: Negative for discharge and redness.  Respiratory:  Positive for cough, shortness of breath and wheezing. Negative for chest tightness.   Cardiovascular: Negative for chest pain.  Gastrointestinal: Negative for abdominal pain, diarrhea, nausea and vomiting.  Musculoskeletal: Negative for myalgias.  Skin: Negative for rash.  Neurological: Negative for dizziness, light-headedness and headaches.     Physical Exam Triage Vital Signs ED Triage Vitals  Enc Vitals Group     BP 02/12/18 1837 (!) 142/87     Pulse Rate 02/12/18 1837 87     Resp 02/12/18 1837 16     Temp 02/12/18 1837 98.1 F (36.7  C)     Temp Source 02/12/18 1837 Oral     SpO2 02/12/18 1837 95 %     Weight --      Height --      Head Circumference --      Peak Flow --      Pain Score 02/12/18 1838 0     Pain Loc --      Pain Edu? --      Excl. in GC? --    No data found.  Updated Vital Signs BP (!) 142/87   Pulse 87   Temp 98.1 F (36.7 C) (Oral)   Resp 16   LMP 01/15/2018   SpO2 95%   Visual Acuity Right Eye Distance:   Left Eye Distance:   Bilateral Distance:    Right Eye Near:   Left Eye Near:    Bilateral Near:     Physical Exam  Constitutional: She appears well-developed and well-nourished. No distress.  HENT:  Head: Normocephalic and atraumatic.  Bilateral ears without tenderness to palpation of external auricle, tragus and mastoid, EAC's without erythema or swelling, TM's with good bony landmarks and cone of light. Non erythematous.  Oral mucosa pink and moist, no tonsillar enlargement or exudate. Posterior pharynx patent and erythematous, no uvula deviation or swelling. Normal phonation.  Eyes: Conjunctivae are normal.  Neck: Neck supple.  Cardiovascular: Normal rate and regular rhythm.  No murmur heard. Pulmonary/Chest: Effort normal. No respiratory distress. She has wheezes.  Audible wheezing prior to auscultation, moderate amount of wheezing and rhonchi throughout bilateral lung fields, significantly improved after breathing treatment, but still present on expiration   Abdominal: Soft. There is no tenderness.  Musculoskeletal: She exhibits no edema.  Neurological: She is alert.  Skin: Skin is warm and dry.  Psychiatric: She has a normal mood and affect.  Nursing note and vitals reviewed.    UC Treatments / Results  Labs (all labs ordered are listed, but only abnormal results are displayed) Labs Reviewed - No data to display  EKG None  Radiology No results found.  Procedures Procedures (including critical care time)  Medications Ordered in UC Medications    ipratropium-albuterol (DUONEB) 0.5-2.5 (3) MG/3ML nebulizer solution 3 mL (3 mLs Nebulization Given 02/12/18 1852)  methylPREDNISolone sodium succinate (SOLU-MEDROL) 125 mg/2 mL injection 125 mg (125 mg Intramuscular Given 02/12/18 1920)    Initial Impression / Assessment and Plan / UC Course  I have reviewed the triage vital signs and the nursing notes.  Pertinent labs & imaging results that were available during my care of the patient were reviewed by me and considered in my medical decision making (see chart for details).     Patient likely with persistent and frequent asthma exacerbation, this time related to congestion and drainage.  Will provide patient with Zyrtec and Flonase to control congestion and drainage, Solu-Medrol in clinic today prior to discharge along with prednisone  taper.  We will try this as alternative in order to hopefully prevent frequent visits in the upcoming days to the emergency room or urgent care.  In the past she is been given Decadron 10 multiple times but has returned for re-visit within 4 to 5 days.  Discussed with patient she likely needs a daily maintenance inhaler and would benefit from seeing an asthma specialist/pulmonology given frequent visits.  Her asthma is not controlled currently with albuterol inhaler.  Patient was in agreement.  Patient barriers include lack of insurance.  She has been unable to obtain Qvar previously prescribed to her due to cost.  Also discussed establishing care with primary care, contact provided for primary care at [facility].  Advised to continue to monitor breathing and follow-up if treatment provided today does not continue to suppress her shortness of breath.Discussed strict return precautions. Patient verbalized understanding and is agreeable with plan.   Final Clinical Impressions(s) / UC Diagnoses   Final diagnoses:  Moderate persistent asthma with exacerbation     Discharge Instructions     We gave you a shot of  Solu-Medrol today, please begin steroid taper tomorrow-begin with 6 tablets on day 1 and decreased by 1 tablet each day until you take 1 tablet on day 6  Please begin daily Zyrtec and Flonase nasal spray to help with congestion drainage that may be worsening your asthma  Please establish care with a primary care and follow-up with an asthma specialist or pulmonology in order to have better control of your asthma  Please follow-up here in emergency room if developing worsening breathing, shortness of breath, chest discomfort, fevers   ED Prescriptions    Medication Sig Dispense Auth. Provider   Cetirizine HCl 10 MG CAPS Take 1 capsule (10 mg total) by mouth daily. 15 capsule Wieters, Hallie C, PA-C   fluticasone (FLONASE) 50 MCG/ACT nasal spray Place 1-2 sprays into both nostrils daily for 7 days. 1 g Wieters, Hallie C, PA-C   predniSONE (STERAPRED UNI-PAK 21 TAB) 10 MG (21) TBPK tablet Take as directed 21 tablet Wieters, Hallie C, PA-C     Controlled Substance Prescriptions [place] Controlled Substance Registry consulted? Not Applicable   Lew Dawes, [place] 02/13/18 1610

## 2018-02-20 ENCOUNTER — Emergency Department (HOSPITAL_COMMUNITY)
Admission: EM | Admit: 2018-02-20 | Discharge: 2018-02-20 | Disposition: A | Discharge status: Home / Self Care | Payer: Self-pay | Attending: Emergency Medicine | Admitting: Emergency Medicine

## 2018-02-20 ENCOUNTER — Emergency Department (HOSPITAL_COMMUNITY): Payer: Self-pay

## 2018-02-20 ENCOUNTER — Encounter (HOSPITAL_COMMUNITY): Payer: Self-pay | Admitting: Emergency Medicine

## 2018-02-20 ENCOUNTER — Other Ambulatory Visit: Payer: Self-pay

## 2018-02-20 DIAGNOSIS — J40 Bronchitis, not specified as acute or chronic: Secondary | ICD-10-CM | POA: Insufficient documentation

## 2018-02-20 DIAGNOSIS — J4521 Mild intermittent asthma with (acute) exacerbation: Secondary | ICD-10-CM | POA: Insufficient documentation

## 2018-02-20 DIAGNOSIS — Z87891 Personal history of nicotine dependence: Secondary | ICD-10-CM | POA: Insufficient documentation

## 2018-02-20 DIAGNOSIS — J45901 Unspecified asthma with (acute) exacerbation: Secondary | ICD-10-CM

## 2018-02-20 LAB — CBC WITH DIFFERENTIAL/PLATELET
Abs Immature Granulocytes: 0.25 10*3/uL — ABNORMAL HIGH (ref 0.00–0.07)
Basophils Absolute: 0.1 10*3/uL (ref 0.0–0.1)
Basophils Relative: 1 %
EOS PCT: 7 %
Eosinophils Absolute: 1.1 10*3/uL — ABNORMAL HIGH (ref 0.0–0.5)
HCT: 43.8 % (ref 36.0–46.0)
HEMOGLOBIN: 14 g/dL (ref 12.0–15.0)
Immature Granulocytes: 2 %
LYMPHS PCT: 20 %
Lymphs Abs: 3.3 10*3/uL (ref 0.7–4.0)
MCH: 29.1 pg (ref 26.0–34.0)
MCHC: 32 g/dL (ref 30.0–36.0)
MCV: 91.1 fL (ref 80.0–100.0)
MONOS PCT: 8 %
Monocytes Absolute: 1.4 10*3/uL — ABNORMAL HIGH (ref 0.1–1.0)
Neutro Abs: 10.7 10*3/uL — ABNORMAL HIGH (ref 1.7–7.7)
Neutrophils Relative %: 62 %
Platelets: 342 10*3/uL (ref 150–400)
RBC: 4.81 MIL/uL (ref 3.87–5.11)
RDW: 14.3 % (ref 11.5–15.5)
WBC: 16.8 10*3/uL — AB (ref 4.0–10.5)
nRBC: 0 % (ref 0.0–0.2)

## 2018-02-20 LAB — BASIC METABOLIC PANEL
Anion gap: 9 (ref 5–15)
BUN: 10 mg/dL (ref 6–20)
CHLORIDE: 101 mmol/L (ref 98–111)
CO2: 28 mmol/L (ref 22–32)
CREATININE: 0.94 mg/dL (ref 0.44–1.00)
Calcium: 8.7 mg/dL — ABNORMAL LOW (ref 8.9–10.3)
GFR calc Af Amer: >60 mL/min (ref >60)
GFR calc non Af Amer: >60 mL/min (ref >60)
GLUCOSE: 84 mg/dL (ref 70–99)
Potassium: 3.2 mmol/L — ABNORMAL LOW (ref 3.5–5.1)
SODIUM: 138 mmol/L (ref 135–145)

## 2018-02-20 LAB — I-STAT BETA HCG BLOOD, ED (MC, WL, AP ONLY)

## 2018-02-20 MED ORDER — ALBUTEROL SULFATE (2.5 MG/3ML) 0.083% IN NEBU
2.5000 mg | INHALATION_SOLUTION | Freq: Once | RESPIRATORY_TRACT | Status: AC
  Administered 2018-02-20: 2.5 mg via RESPIRATORY_TRACT
  Filled 2018-02-20: qty 3

## 2018-02-20 MED ORDER — ALBUTEROL SULFATE HFA 108 (90 BASE) MCG/ACT IN AERS
1.0000 | INHALATION_SPRAY | Freq: Once | RESPIRATORY_TRACT | Status: AC
  Administered 2018-02-20: 2 via RESPIRATORY_TRACT
  Filled 2018-02-20: qty 6.7

## 2018-02-20 MED ORDER — PREDNISONE 20 MG PO TABS
50.0000 mg | ORAL_TABLET | Freq: Once | ORAL | Status: AC
  Administered 2018-02-20: 50 mg via ORAL
  Filled 2018-02-20: qty 3

## 2018-02-20 MED ORDER — ALBUTEROL SULFATE (2.5 MG/3ML) 0.083% IN NEBU
5.0000 mg | INHALATION_SOLUTION | Freq: Once | RESPIRATORY_TRACT | Status: AC
  Administered 2018-02-20: 5 mg via RESPIRATORY_TRACT
  Filled 2018-02-20: qty 6

## 2018-02-20 MED ORDER — PREDNISONE 20 MG PO TABS: 40.0000 mg | ORAL_TABLET | Freq: Every day | ORAL | 0 refills | Status: DC

## 2018-02-20 MED ORDER — IPRATROPIUM-ALBUTEROL 0.5-2.5 (3) MG/3ML IN SOLN
3.0000 mL | Freq: Once | RESPIRATORY_TRACT | Status: AC
  Administered 2018-02-20: 3 mL via RESPIRATORY_TRACT
  Filled 2018-02-20: qty 3

## 2018-02-20 NOTE — ED Triage Notes (Signed)
Pt c/o asthma exacerbation. Reports increased use of inhalers at home. Wheezing noted.

## 2018-02-20 NOTE — Discharge Instructions (Signed)
Please read attached information. If you experience any new or worsening signs or symptoms please return to the emergency room for evaluation. Please follow-up with your primary care provider or specialist as discussed. Please use medication prescribed only as directed and discontinue taking if you have any concerning signs or symptoms.   °

## 2018-02-20 NOTE — ED Provider Notes (Signed)
[facility] EMERGENCY DEPARTMENT Provider Note   CSN: [id number] Arrival date & time: 02/20/18  1752     History   Chief Complaint Chief Complaint  Patient presents with  . Asthma    HPI Wendy Morgan is a 42 y.o. female.  HPI   42 year old female with a past medical history of asthma presents today with complaints of asthma exacerbation.  She notes she was in her usual state of health yesterday.  Today she woke up with wheezing and chest tightness.  She denies any productive cough or fever, denies any chest pain.  Denies any lower extremity swelling or edema.  She notes this is identical to previous episodes of asthma, notes that she has run out of her inhaler.  Past Medical History:  Diagnosis Date  . Asthma   . Bronchiectasis with acute exacerbation (HCC)     There are no active problems to display for this patient.   History reviewed. No pertinent surgical history.   OB History   None      Home Medications    Prior to Admission medications   Medication Sig Start Date End Date Taking? Authorizing Provider  albuterol (PROVENTIL HFA;VENTOLIN HFA) 108 (90 Base) MCG/ACT inhaler Inhale 1-2 puffs into the lungs every 6 (six) hours as needed for wheezing. 01/02/18   Garlon Hatchet, PA-C  albuterol (PROVENTIL) (5 MG/ML) 0.5% nebulizer solution Take 1 mL (5 mg total) by nebulization every 6 (six) hours as needed for wheezing or shortness of breath. 01/02/18   Garlon Hatchet, PA-C  azithromycin (ZITHROMAX) 250 MG tablet Take 1 tablet (250 mg total) by mouth daily. Take first 2 tablets together, then 1 every day until finished. 01/30/18   Garlon Hatchet, PA-C  beclomethasone (QVAR REDIHALER) 40 MCG/ACT inhaler Inhale 1 puff into the lungs 2 (two) times daily. 10/17/17   Wurst, [place], PA-C  Cetirizine HCl 10 MG CAPS Take 1 capsule (10 mg total) by mouth daily. 02/12/18   Wieters, Hallie C, PA-C  fluticasone (FLONASE) 50 MCG/ACT nasal spray Place 1-2  sprays into both nostrils daily for 7 days. 02/12/18 02/19/18  Wieters, Hallie C, PA-C  predniSONE (DELTASONE) 20 MG tablet Take 2 tablets (40 mg total) by mouth daily. 02/20/18   Eyvonne Mechanic, PA-C    Family History No family history on file.  Social History Social History   Tobacco Use  . Smoking status: Former Smoker    Last attempt to quit: 1998    Years since quitting: 21.8  . Smokeless tobacco: Never Used  Substance Use Topics  . Alcohol use: No  . Drug use: No     Allergies   Patient has no known allergies.   Review of Systems Review of Systems  All other systems reviewed and are negative.   Physical Exam Updated Vital Signs BP (!) 146/83   Pulse 91   Temp 97.7 F (36.5 C) (Oral)   Resp 20   Ht 5\' 4"  (1.626 m)   Wt 81.6 kg   SpO2 99%   BMI 30.90 kg/m   Physical Exam  Constitutional: She is oriented to person, place, and time. She appears well-developed and well-nourished.  HENT:  Head: Normocephalic and atraumatic.  Eyes: Pupils are equal, round, and reactive to light. Conjunctivae are normal. Right eye exhibits no discharge. Left eye exhibits no discharge. No scleral icterus.  Neck: Normal range of motion. No JVD present. No tracheal deviation present.  Pulmonary/Chest: Effort normal. No stridor.  Bilateral inspiratory and expiratory wheeze, no crackles noted  Neurological: She is alert and oriented to person, place, and time. Coordination normal.  Psychiatric: She has a normal mood and affect. Her behavior is normal. Judgment and thought content normal.  Nursing note and vitals reviewed.   ED Treatments / Results  Labs (all labs ordered are listed, but only abnormal results are displayed) Labs Reviewed  CBC WITH DIFFERENTIAL/PLATELET - Abnormal; Notable for the following components:      Result Value   WBC 16.8 (*)    Neutro Abs 10.7 (*)    Monocytes Absolute 1.4 (*)    Eosinophils Absolute 1.1 (*)    Abs Immature Granulocytes 0.25 (*)     All other components within normal limits  BASIC METABOLIC PANEL - Abnormal; Notable for the following components:   Potassium 3.2 (*)    Calcium 8.7 (*)    All other components within normal limits  I-STAT BETA HCG BLOOD, ED ([facility], [facility], [facility] ONLY)    EKG None  Radiology Dg Chest 2 View  Result Date: 02/20/2018 CLINICAL DATA:  Cough and wheezing. Chest tightness. History of asthma. EXAM: CHEST - 2 VIEW COMPARISON:  Chest radiograph February 03, 2018 FINDINGS: Cardiomediastinal silhouette is normal. No pleural effusions or focal consolidations. Mild bronchitic changes. Trachea projects midline and there is no pneumothorax. Soft tissue planes and included osseous structures are non-suspicious. IMPRESSION: Mild bronchitic changes without focal consolidation. Electronically Signed   By: Awilda Metro M.D.   On: 02/20/2018 21:22    Procedures Procedures (including critical care time)  Medications Ordered in ED Medications  albuterol (PROVENTIL HFA;VENTOLIN HFA) 108 (90 Base) MCG/ACT inhaler 1-2 puff (has no administration in time range)  albuterol (PROVENTIL) (2.5 MG/3ML) 0.083% nebulizer solution 5 mg (5 mg Nebulization Given 02/20/18 1801)  ipratropium-albuterol (DUONEB) 0.5-2.5 (3) MG/3ML nebulizer solution 3 mL (3 mLs Nebulization Given 02/20/18 1921)  predniSONE (DELTASONE) tablet 50 mg (50 mg Oral Given 02/20/18 1925)  albuterol (PROVENTIL) (2.5 MG/3ML) 0.083% nebulizer solution 2.5 mg (2.5 mg Nebulization Given 02/20/18 2034)     Initial Impression / Assessment and Plan / ED Course  I have reviewed the triage vital signs and the nursing notes.  Pertinent labs & imaging results that were available during my care of the patient were reviewed by me and considered in my medical decision making (see chart for details).     Labs: CBC, BMP  Imaging: DG chest 2 view  Consults:  Therapeutics: Albuterol, ipratropium  Discharge Meds: Albuterol, prednisone  Assessment/Plan: 42 year old  female presents today with asthma exacerbation, chest x-ray shows likely bronchitis, no signs of bacterial upper respiratory infection.  Patient's breathing improved here, very minor expiratory wheeze with no signs of respiratory distress.  Patient discharged with albuterol, prednisone strict return precautions outpatient follow-up.  She verbalized understanding and agreement to today's plan.   Final Clinical Impressions(s) / ED Diagnoses   Final diagnoses:  Mild asthma with exacerbation, unspecified whether persistent  Bronchitis    ED Discharge Orders         Ordered    predniSONE (DELTASONE) 20 MG tablet  Daily     02/20/18 2131           Eyvonne Mechanic, PA-C 02/20/18 2136    Charlynne Pander, MD 02/20/18 2329

## 2018-02-20 NOTE — ED Notes (Signed)
Pt ambulated to the bathroom independently with steady gait

## 2018-02-20 NOTE — ED Notes (Signed)
Patient transported to X-ray 

## 2018-02-28 ENCOUNTER — Emergency Department (HOSPITAL_COMMUNITY)
Admission: EM | Admit: 2018-02-28 | Discharge: 2018-02-28 | Disposition: A | Discharge status: Home / Self Care | Payer: Self-pay | Attending: Emergency Medicine | Admitting: Emergency Medicine

## 2018-02-28 ENCOUNTER — Other Ambulatory Visit: Payer: Self-pay

## 2018-02-28 ENCOUNTER — Encounter (HOSPITAL_COMMUNITY): Payer: Self-pay

## 2018-02-28 DIAGNOSIS — Z87891 Personal history of nicotine dependence: Secondary | ICD-10-CM | POA: Insufficient documentation

## 2018-02-28 DIAGNOSIS — Z79899 Other long term (current) drug therapy: Secondary | ICD-10-CM | POA: Insufficient documentation

## 2018-02-28 DIAGNOSIS — J45901 Unspecified asthma with (acute) exacerbation: Secondary | ICD-10-CM | POA: Insufficient documentation

## 2018-02-28 MED ORDER — IPRATROPIUM-ALBUTEROL 0.5-2.5 (3) MG/3ML IN SOLN
3.0000 mL | Freq: Once | RESPIRATORY_TRACT | Status: AC
  Administered 2018-02-28: 3 mL via RESPIRATORY_TRACT
  Filled 2018-02-28: qty 3

## 2018-02-28 MED ORDER — AEROCHAMBER PLUS FLO-VU MEDIUM MISC
1.0000 | Freq: Once | Status: AC
  Administered 2018-02-28: 1
  Filled 2018-02-28 (×2): qty 1

## 2018-02-28 MED ORDER — PREDNISONE 10 MG PO TABS: 60.0000 mg | ORAL_TABLET | Freq: Every day | ORAL | 0 refills | Status: AC

## 2018-02-28 MED ORDER — ALBUTEROL SULFATE (2.5 MG/3ML) 0.083% IN NEBU
5.0000 mg | INHALATION_SOLUTION | Freq: Once | RESPIRATORY_TRACT | Status: AC
  Administered 2018-02-28: 5 mg via RESPIRATORY_TRACT
  Filled 2018-02-28: qty 6

## 2018-02-28 MED ORDER — METHYLPREDNISOLONE SODIUM SUCC 125 MG IJ SOLR
125.0000 mg | Freq: Once | INTRAMUSCULAR | Status: AC
  Administered 2018-02-28: 125 mg via INTRAVENOUS
  Filled 2018-02-28: qty 2

## 2018-02-28 NOTE — ED Provider Notes (Signed)
[facility] EMERGENCY DEPARTMENT Provider Note   CSN: [id number] Arrival date & time: 02/28/18  4098     History   Chief Complaint Chief Complaint  Patient presents with  . Asthma    HPI Wendy Morgan is a 42 y.o. female.  HPI   Patient is a 42 year old female with a history of asthma who presents emergency department today for evaluation of asthma exacerbation.  Patient states that last night she began to feel short of breath and have significant wheezing.  This morning she woke up with a dry cough and increased shortness of breath.  She tried taking her albuterol inhaler at home with no significant relief.  Denies any fevers, chills or other upper respiratory symptoms.  No lower extremity swelling or chest pain.  Of note patient was recently seen for an asthma exacerbation on 02/20/2018.  She states she completed her first of prednisone from this visit and felt improved up until last night.  Last dose of prednisone was several days ago.  Patient denies ever being admitted to the hospital for her asthma.  Denies ever having to be intubated for her asthma.  States she is supposed to be on Qvar but did not have a refill for this prescription so is not currently taking it.  Past Medical History:  Diagnosis Date  . Asthma   . Bronchiectasis with acute exacerbation (HCC)     There are no active problems to display for this patient.   History reviewed. No pertinent surgical history.   OB History   None      Home Medications    Prior to Admission medications   Medication Sig Start Date End Date Taking? Authorizing Provider  albuterol (PROVENTIL HFA;VENTOLIN HFA) 108 (90 Base) MCG/ACT inhaler Inhale 1-2 puffs into the lungs every 6 (six) hours as needed for wheezing. 01/02/18  Yes Garlon Hatchet, PA-C  albuterol (PROVENTIL) (5 MG/ML) 0.5% nebulizer solution Take 1 mL (5 mg total) by nebulization every 6 (six) hours as needed for wheezing or shortness of  breath. 01/02/18  Yes Garlon Hatchet, PA-C  Cetirizine HCl 10 MG CAPS Take 1 capsule (10 mg total) by mouth daily. 02/12/18  Yes Wieters, Hallie C, PA-C  azithromycin (ZITHROMAX) 250 MG tablet Take 1 tablet (250 mg total) by mouth daily. Take first 2 tablets together, then 1 every day until finished. Patient not taking: Reported on 02/28/2018 01/30/18   Garlon Hatchet, PA-C  beclomethasone (QVAR REDIHALER) 40 MCG/ACT inhaler Inhale 1 puff into the lungs 2 (two) times daily. Patient not taking: Reported on 02/28/2018 10/17/17   Wurst, [place], PA-C  fluticasone [facility]) 50 MCG/ACT nasal spray Place 1-2 sprays into both nostrils daily for 7 days. Patient not taking: Reported on 02/28/2018 02/12/18 02/28/26  Wieters, Hallie C, PA-C  predniSONE (DELTASONE) 10 MG tablet Take 6 tablets (60 mg total) by mouth daily for 5 days. 02/28/18 03/05/18  Jahnia Hewes S, PA-C    Family History History reviewed. No pertinent family history.  Social History Social History   Tobacco Use  . Smoking status: Former Smoker    Last attempt to quit: 1998    Years since quitting: 21.8  . Smokeless tobacco: Never Used  Substance Use Topics  . Alcohol use: No  . Drug use: No     Allergies   Patient has no known allergies.   Review of Systems Review of Systems  Constitutional: Negative for chills and fever.  HENT: Negative for congestion,  rhinorrhea and sore throat.   Eyes: Negative for visual disturbance.  Respiratory: Positive for cough, shortness of breath and wheezing.   Cardiovascular: Negative for chest pain and leg swelling.  Gastrointestinal: Negative for abdominal pain, constipation, diarrhea, nausea and vomiting.  Genitourinary: Negative for dysuria.  Musculoskeletal: Negative for back pain.  Skin: Negative for rash.  Neurological: Negative for headaches.     Physical Exam Updated Vital Signs BP 125/68 Comment: Simultaneous filing. User may not have seen previous data.  Pulse 72    Temp 98.3 F (36.8 C) (Oral)   Resp 16   Ht 5\' 4"  (1.626 m)   Wt 81.6 kg   SpO2 99%   BMI 30.90 kg/m   Physical Exam  Constitutional: She appears well-developed and well-nourished. No distress.  HENT:  Head: Normocephalic and atraumatic.  Mouth/Throat: Oropharynx is clear and moist.  Eyes: Conjunctivae are normal.  Neck: Neck supple.  Cardiovascular: Normal rate, regular rhythm and normal heart sounds.  No murmur heard. Pulmonary/Chest: No respiratory distress. She has wheezes (in all lung fields).  tachypneic  Abdominal: Soft. There is no tenderness.  Musculoskeletal:  No calf TTP, erythema, swelling.  Neurological: She is alert.  Skin: Skin is warm and dry.  Psychiatric: She has a normal mood and affect.  Nursing note and vitals reviewed.    ED Treatments / Results  Labs (all labs ordered are listed, but only abnormal results are displayed) Labs Reviewed - No data to display  EKG EKG Interpretation  Date/Time:  Thursday February 28 2018 06:18:22 EST Ventricular Rate:  83 PR Interval:    QRS Duration: 98 QT Interval:  391 QTC Calculation: 460 R Axis:   53 Text Interpretation:  Sinus rhythm No significant change since last tracing Confirmed by Marily Memos [id number]) on 02/28/2018 6:24:35 AM   Radiology No results found.  Procedures Procedures (including critical care time)  Medications Ordered in ED Medications  AEROCHAMBER PLUS FLO-VU MEDIUM MISC 1 each (has no administration in time range)  methylPREDNISolone sodium succinate (SOLU-MEDROL) 125 mg/2 mL injection 125 mg (125 mg Intravenous Given 02/28/18 0625)  albuterol (PROVENTIL) (2.5 MG/3ML) 0.083% nebulizer solution 5 mg (5 mg Nebulization Given 02/28/18 0625)  ipratropium-albuterol (DUONEB) 0.5-2.5 (3) MG/3ML nebulizer solution 3 mL (3 mLs Nebulization Given 02/28/18 4098)     Initial Impression / Assessment and Plan / ED Course  I have reviewed the triage vital signs and the nursing  notes.  Pertinent labs & imaging results that were available during my care of the patient were reviewed by me and considered in my medical decision making (see chart for details).    Final Clinical Impressions(s) / ED Diagnoses   Final diagnoses:  Exacerbation of asthma, unspecified asthma severity, unspecified whether persistent   Pt presents to the ED today c/o asthma exacerbation that began last night. Recent had asthma exacerbation and just finished steroid burst a few days ago. On exam has diffuse wheezing in all lung fields. Is tachypneic but maintaining O2 sats at 97-99% on RA. Remainder of vitals stable. No fevers, chills or other URI sxs therefore labs felt not to be indicated. Will give solumedrol, albuterol, and duo nebs and reassess.   EKG with NSR, no ischemic changes. Unchanged from previous.   On repeat exam, pt still has some wheezing on exam but she feels much improved and states she is breathing back to baseline.  I personally ambulated the patient around the department and she maintained her O2 sats at 99 to 100%  on room air on pulse ox with good waveform.  She did not feel tachypneic with ambulation.  Will discharge her with Rx for prednisone burst.  She states she has an albuterol inhaler at home, will give her a spacer here in the ED.  Advised her to use her albuterol inhaler every 4-6 hours and to take steroid burst as directed.  [facility] make an appointment as she likely needs to be put back on her steroid inhaler.  Advised to return to the ER for any new or worsening symptoms in the meantime. she voiced understanding the plan & reasons to return to the ED. all questions answered.  ED Discharge Orders         Ordered    predniSONE (DELTASONE) 10 MG tablet  Daily     02/28/18 0711           Karrie MeresCouture, Alicja Everitt S, PA-C 02/28/18 [id number]    Marily MemosMesner, Jason, MD 02/28/18 [phone]

## 2018-02-28 NOTE — Discharge Instructions (Addendum)
Please take steroid as directed on your discharge paperwork.   Please use two puffs of your albuterol inhaler every 4 hours for wheezing, shortness of breath, or cough.  Please follow up with your primary care provider within 5-7 days for re-evaluation of your symptoms.  Please return to the emergency department for any new or worsening symptoms.

## 2018-02-28 NOTE — ED Notes (Signed)
Patient verbalizes understanding of discharge instructions. Opportunity for questioning and answers were provided. Pt discharged from ED. 

## 2018-02-28 NOTE — ED Triage Notes (Signed)
Pt arrive POV d/t asthma exacerbation. Pt developed coughing and wheezing that developed overnight

## 2018-03-13 ENCOUNTER — Encounter (HOSPITAL_COMMUNITY): Payer: Self-pay

## 2018-03-13 ENCOUNTER — Emergency Department (HOSPITAL_COMMUNITY)
Admission: EM | Admit: 2018-03-13 | Discharge: 2018-03-14 | Disposition: A | Discharge status: Home / Self Care | Payer: Self-pay | Attending: Emergency Medicine | Admitting: Emergency Medicine

## 2018-03-13 DIAGNOSIS — J45901 Unspecified asthma with (acute) exacerbation: Secondary | ICD-10-CM | POA: Insufficient documentation

## 2018-03-13 DIAGNOSIS — Z87891 Personal history of nicotine dependence: Secondary | ICD-10-CM | POA: Insufficient documentation

## 2018-03-13 DIAGNOSIS — Z79899 Other long term (current) drug therapy: Secondary | ICD-10-CM | POA: Insufficient documentation

## 2018-03-13 MED ORDER — ALBUTEROL SULFATE (2.5 MG/3ML) 0.083% IN NEBU
5.0000 mg | INHALATION_SOLUTION | Freq: Once | RESPIRATORY_TRACT | Status: AC
  Administered 2018-03-13: 5 mg via RESPIRATORY_TRACT
  Filled 2018-03-13: qty 6

## 2018-03-13 MED ORDER — IPRATROPIUM-ALBUTEROL 0.5-2.5 (3) MG/3ML IN SOLN
5.0000 mL | Freq: Once | RESPIRATORY_TRACT | Status: AC
  Administered 2018-03-13: 5 mL via RESPIRATORY_TRACT
  Filled 2018-03-13: qty 3

## 2018-03-13 MED ORDER — PREDNISONE 20 MG PO TABS: 40.0000 mg | ORAL_TABLET | Freq: Every day | ORAL | 0 refills | Status: DC

## 2018-03-13 MED ORDER — PREDNISONE 20 MG PO TABS
60.0000 mg | ORAL_TABLET | Freq: Once | ORAL | Status: AC
  Administered 2018-03-13: 60 mg via ORAL
  Filled 2018-03-13: qty 3

## 2018-03-13 MED ORDER — ALBUTEROL (5 MG/ML) CONTINUOUS INHALATION SOLN
10.0000 mg/h | INHALATION_SOLUTION | RESPIRATORY_TRACT | Status: AC
  Administered 2018-03-13: 10 mg/h via RESPIRATORY_TRACT
  Filled 2018-03-13: qty 20

## 2018-03-13 NOTE — ED Provider Notes (Signed)
[facility] EMERGENCY DEPARTMENT Provider Note   CSN: [id number] Arrival date & time: 03/13/18  2153     History   Chief Complaint Chief Complaint  Patient presents with  . Asthma    HPI Wendy Morgan is a 42 y.o. female.  Patient presents to the emergency department with a chief complaint of asthma exacerbation.  She reports having these frequently.  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  She acknowledges that she needs to wear a mask.  She denies any fever or chills.  She states that she does have some cough, but has been unchanged for quite some time.  She does get good relief with nebulizer treatments and prednisone, but then the environmental exposure at her job exacerbates her symptoms again.  She denies any other complaints at this time.  The history is provided by the patient. No language interpreter was used.    Past Medical History:  Diagnosis Date  . Asthma   . Bronchiectasis with acute exacerbation (HCC)     There are no active problems to display for this patient.   History reviewed. No pertinent surgical history.   OB History   None      Home Medications    Prior to Admission medications   Medication Sig Start Date End Date Taking? Authorizing Provider  albuterol (PROVENTIL HFA;VENTOLIN HFA) 108 (90 Base) MCG/ACT inhaler Inhale 1-2 puffs into the lungs every 6 (six) hours as needed for wheezing. 01/02/18   Garlon Hatchet, PA-C  albuterol (PROVENTIL) (5 MG/ML) 0.5% nebulizer solution Take 1 mL (5 mg total) by nebulization every 6 (six) hours as needed for wheezing or shortness of breath. 01/02/18   Garlon Hatchet, PA-C  azithromycin (ZITHROMAX) 250 MG tablet Take 1 tablet (250 mg total) by mouth daily. Take first 2 tablets together, then 1 every day until finished. Patient not taking: Reported on 02/28/2018 01/30/18   Garlon Hatchet, PA-C  beclomethasone (QVAR REDIHALER) 40 MCG/ACT  inhaler Inhale 1 puff into the lungs 2 (two) times daily. Patient not taking: Reported on 02/28/2018 10/17/17   Alvino Chapel [place], PA-C  Cetirizine HCl 10 MG CAPS Take 1 capsule (10 mg total) by mouth daily. 02/12/18   Wieters, Hallie C, PA-C  fluticasone (FLONASE) 50 MCG/ACT nasal spray Place 1-2 sprays into both nostrils daily for 7 days. Patient not taking: Reported on 02/28/2018 02/12/18 02/28/26  Lew Dawes, PA-C    Family History No family history on file.  Social History Social History   Tobacco Use  . Smoking status: Former Smoker    Last attempt to quit: 1998    Years since quitting: 21.9  . Smokeless tobacco: Never Used  Substance Use Topics  . Alcohol use: No  . Drug use: No     Allergies   Patient has no known allergies.   Review of Systems Review of Systems  All other systems reviewed and are negative.    Physical Exam Updated Vital Signs BP (!) 148/79   Pulse 98   Temp 98 F (36.7 C) (Oral)   Resp 20   SpO2 97%   Physical Exam  Constitutional: She is oriented to person, place, and time. She appears well-developed and well-nourished.  HENT:  Head: Normocephalic and atraumatic.  Eyes: Pupils are equal, round, and reactive to light. Conjunctivae and EOM are normal.  Neck: Normal range of motion. Neck supple.  Cardiovascular: Normal rate and regular  rhythm. Exam reveals no gallop and no friction rub.  No murmur heard. Pulmonary/Chest: Effort normal. No respiratory distress. She has wheezes. She has no rales. She exhibits no tenderness.  Diffuse and expiratory wheezes throughout, speaks in complete sentences, no respiratory distress  Abdominal: Soft. Bowel sounds are normal. She exhibits no distension and no mass. There is no tenderness. There is no rebound and no guarding.  Musculoskeletal: Normal range of motion. She exhibits no edema or tenderness.  Neurological: She is alert and oriented to person, place, and time.  Skin: Skin is warm and dry.    Psychiatric: She has a normal mood and affect. Her behavior is normal. Judgment and thought content normal.  Nursing note and vitals reviewed.    ED Treatments / Results  Labs (all labs ordered are listed, but only abnormal results are displayed) Labs Reviewed - No data to display  EKG None  Radiology No results found.  Procedures Procedures (including critical care time) CRITICAL CARE Performed by: Roxy Horsemanobert Keiasia Christianson  Multiple breathing treatments, multiple reassessments, continuous albuterol treatment Total critical care time: 38 minutes  Critical care time was exclusive of separately billable procedures and treating other patients.  Critical care was necessary to treat or prevent imminent or life-threatening deterioration.  Critical care was time spent personally by me on the following activities: development of treatment plan with patient and/or surrogate as well as nursing, discussions with consultants, evaluation of patient's response to treatment, examination of patient, obtaining history from patient or surrogate, ordering and performing treatments and interventions, ordering and review of laboratory studies, ordering and review of radiographic studies, pulse oximetry and re-evaluation of patient's condition.  Medications Ordered in ED Medications  ipratropium-albuterol (DUONEB) 0.5-2.5 (3) MG/3ML nebulizer solution 5 mL (has no administration in time range)  predniSONE (DELTASONE) tablet 60 mg (has no administration in time range)  albuterol (PROVENTIL) (2.5 MG/3ML) 0.083% nebulizer solution 5 mg (5 mg Nebulization Given 03/13/18 2202)     Initial Impression / Assessment and Plan / ED Course  I have reviewed the triage vital signs and the nursing notes.  Pertinent labs & imaging results that were available during my care of the patient were reviewed by me and considered in my medical decision making (see chart for details).    Patient with asthma exacerbation.  She  does have significant wheezing even after 1 breathing treatment in triage.  Will give prednisone and an additional breathing treatment.  She may require an hour-long treatment.  We discussed wearing a mask at work, I provided her with some locations that she may be able to find an appropriate mask.  Patient reassessed after second breathing treatment.  She states that she is feeling improved, but still continues to have wheezing and still feels tight.  Will give hour-long breathing treatment and reassess.  Patient reassessed after continuous albuterol treatment.  Reports feeling significantly improved, though she does still have some minor end expiratory wheezes present.  She would like to be discharged.  Will discharge with prednisone taper.  Recommend pulmonology follow-up. Final Clinical Impressions(s) / ED Diagnoses   Final diagnoses:  Exacerbation of asthma, unspecified asthma severity, unspecified whether persistent    ED Discharge Orders         Ordered    predniSONE (DELTASONE) 20 MG tablet  Daily     03/13/18 2353           Roxy HorsemanBrowning, Mcdaniel Ohms, PA-C 03/13/18 2354    Cathren LaineSteinl, Kevin, MD 03/14/18 [phone]

## 2018-03-13 NOTE — ED Triage Notes (Signed)
Pt reports that asthma started to act up today, unrelieved by MDI and at home nebs. Audible wheezing.

## 2018-03-15 ENCOUNTER — Other Ambulatory Visit: Payer: Self-pay

## 2018-03-15 ENCOUNTER — Encounter (HOSPITAL_COMMUNITY): Payer: Self-pay | Admitting: Emergency Medicine

## 2018-03-15 ENCOUNTER — Emergency Department (HOSPITAL_COMMUNITY)
Admission: EM | Admit: 2018-03-15 | Discharge: 2018-03-15 | Disposition: A | Discharge status: Home / Self Care | Payer: Self-pay | Attending: Emergency Medicine | Admitting: Emergency Medicine

## 2018-03-15 DIAGNOSIS — Z87891 Personal history of nicotine dependence: Secondary | ICD-10-CM | POA: Insufficient documentation

## 2018-03-15 DIAGNOSIS — J4531 Mild persistent asthma with (acute) exacerbation: Secondary | ICD-10-CM | POA: Insufficient documentation

## 2018-03-15 MED ORDER — BECLOMETHASONE DIPROP HFA 40 MCG/ACT IN AERB: 1.0000 | INHALATION_SPRAY | Freq: Two times a day (BID) | RESPIRATORY_TRACT | 2 refills | Status: DC

## 2018-03-15 MED ORDER — ALBUTEROL SULFATE (2.5 MG/3ML) 0.083% IN NEBU
5.0000 mg | INHALATION_SOLUTION | Freq: Once | RESPIRATORY_TRACT | Status: AC
  Administered 2018-03-15: 5 mg via RESPIRATORY_TRACT
  Filled 2018-03-15: qty 6

## 2018-03-15 MED ORDER — PREDNISONE 20 MG PO TABS
60.0000 mg | ORAL_TABLET | Freq: Once | ORAL | Status: AC
  Administered 2018-03-15: 60 mg via ORAL
  Filled 2018-03-15: qty 3

## 2018-03-15 MED ORDER — IPRATROPIUM-ALBUTEROL 0.5-2.5 (3) MG/3ML IN SOLN
3.0000 mL | Freq: Once | RESPIRATORY_TRACT | Status: AC
  Administered 2018-03-15: 3 mL via RESPIRATORY_TRACT
  Filled 2018-03-15: qty 3

## 2018-03-15 NOTE — Discharge Instructions (Addendum)
Take prednisone, as prescribed, until gone. Fill the prescription for Qvar and begin using this, as prescribed, daily regardless of how you feel that day. Follow-up with a primary care provider as soon as possible for maintenance of your asthma.  Call the number provided to set up an appointment.

## 2018-03-15 NOTE — ED Provider Notes (Signed)
[facility] EMERGENCY DEPARTMENT Provider Note   CSN: [id number] Arrival date & time: 03/15/18  0556     History   Chief Complaint Chief Complaint  Patient presents with  . Shortness of Breath  . Cough    HPI Wendy Morgan is a 42 y.o. female.  HPI   Wendy Morgan is a 42 y.o. female, with a history of asthma, presenting to the ED with shortness of breath and wheezing for the past several days.  States this feels like an asthma exacerbation.  Patient has multiple ED visits for the same.  She does not have a PCP, but acknowledges that she needs one.  She has been using her home albuterol nebulizer and inhaler with temporary relief.  She does endorse a cough, but states this is not abnormal for her when she has an asthma exacerbation. When she was seen in the ED on November 27, she was discharged with a prescription for prednisone.  She has not filled this stating that she was busy getting ready for Thanksgiving.  She states she is supposed to be taking an inhaled steroid, previously prescribed Qvar, but has run out.  Denies fever/chills, chest pain, N/V/D, abdominal pain, lower extremity pain/swelling, or any other complaints.      Past Medical History:  Diagnosis Date  . Asthma   . Bronchiectasis with acute exacerbation (HCC)     There are no active problems to display for this patient.   History reviewed. No pertinent surgical history.   OB History   None      Home Medications    Prior to Admission medications   Medication Sig Start Date End Date Taking? Authorizing Provider  albuterol (PROVENTIL HFA;VENTOLIN HFA) 108 (90 Base) MCG/ACT inhaler Inhale 1-2 puffs into the lungs every 6 (six) hours as needed for wheezing. 01/02/18  Yes Garlon Hatchet, PA-C  albuterol (PROVENTIL) (5 MG/ML) 0.5% nebulizer solution Take 1 mL (5 mg total) by nebulization every 6 (six) hours as needed for wheezing or shortness of breath. 01/02/18  Yes Garlon Hatchet, PA-C  Cetirizine HCl 10 MG CAPS Take 1 capsule (10 mg total) by mouth daily. 02/12/18  Yes Wieters, Hallie C, PA-C  beclomethasone (QVAR) 40 MCG/ACT inhaler Inhale 1 puff into the lungs 2 (two) times daily. 03/15/18   Marjon Doxtater C, PA-C  fluticasone (FLONASE) 50 MCG/ACT nasal spray Place 1-2 sprays into both nostrils daily for 7 days. Patient not taking: Reported on 02/28/2018 02/12/18 02/28/26  Wieters, Hallie C, PA-C  predniSONE (DELTASONE) 20 MG tablet Take 2 tablets (40 mg total) by mouth daily. Take 40 mg by mouth daily for 3 days, then 20mg  by mouth daily for 3 days, then 10mg  daily for 3 days 03/13/18   Roxy Horseman, PA-C    Family History History reviewed. No pertinent family history.  Social History Social History   Tobacco Use  . Smoking status: Former Smoker    Last attempt to quit: 1998    Years since quitting: 21.9  . Smokeless tobacco: Never Used  Substance Use Topics  . Alcohol use: No  . Drug use: No     Allergies   Patient has no known allergies.   Review of Systems Review of Systems  Constitutional: Negative for chills, diaphoresis and fever.  HENT: Negative for sore throat, trouble swallowing and voice change.   Respiratory: Positive for cough and shortness of breath.   Cardiovascular: Negative for chest pain and leg swelling.  Gastrointestinal: Negative for abdominal pain, diarrhea, nausea and vomiting.  All other systems reviewed and are negative.    Physical Exam Updated Vital Signs BP 107/89 (BP Location: Right Arm)   Pulse (!) 104   Temp 97.8 F (36.6 C) (Oral)   Resp 18   Ht 5\' 4"  (1.626 m)   Wt 81.7 kg   SpO2 100%   BMI 30.90 kg/m   Physical Exam  Constitutional: She appears well-developed and well-nourished. No distress.  HENT:  Head: Normocephalic and atraumatic.  Eyes: Conjunctivae are normal.  Neck: Neck supple.  Cardiovascular: Normal rate, regular rhythm, normal heart sounds and intact distal pulses.  Pulmonary/Chest:  Tachypnea noted. She has wheezes (Global, inspiratory and expiratory).  Some increased work of breathing, however, patient can carry on a conversation fluidly.  Abdominal: Soft. There is no tenderness. There is no guarding.  Musculoskeletal: She exhibits no edema.  Lymphadenopathy:    She has no cervical adenopathy.  Neurological: She is alert.  Skin: Skin is warm and dry. She is not diaphoretic.  Psychiatric: She has a normal mood and affect. Her behavior is normal.  Nursing note and vitals reviewed.    ED Treatments / Results  Labs (all labs ordered are listed, but only abnormal results are displayed) Labs Reviewed - No data to display  EKG EKG Interpretation  Date/Time:  Friday March 15 2018 06:05:10 EST Ventricular Rate:  89 PR Interval:    QRS Duration: 87 QT Interval:  371 QTC Calculation: 452 R Axis:   44 Text Interpretation:  Sinus rhythm No significant change since last tracing Confirmed by Rochele RaringWard, Kristen [phone]([id number]) on 03/15/2018 6:07:56 AM   Radiology No results found.  Procedures Procedures (including critical care time)  Medications Ordered in ED Medications  albuterol (PROVENTIL) (2.5 MG/3ML) 0.083% nebulizer solution 5 mg (5 mg Nebulization Given 03/15/18 0609)  ipratropium-albuterol (DUONEB) 0.5-2.5 (3) MG/3ML nebulizer solution 3 mL (3 mLs Nebulization Given 03/15/18 0634)  predniSONE (DELTASONE) tablet 60 mg (60 mg Oral Given 03/15/18 [id number])     Initial Impression / Assessment and Plan / ED Course  I have reviewed the triage vital signs and the nursing notes.  Pertinent labs & imaging results that were available during my care of the patient were reviewed by me and considered in my medical decision making (see chart for details).  Clinical Course as of Mar 15 700  Caleen EssexFri Mar 15, 2018  [id number] Patient states she feels much better and she is ready for discharge.  She continues to have wheezing, but her work of breathing has improved.   [SJ]    Clinical  Course User Index [SJ] Shaurya Rawdon C, PA-C   Patient presents with shortness of breath and wheezing.  Symptoms are consistent with an asthma exacerbation, of which patient has a frequent history.  She showed significant improvement during her ED course.  Case management consult was placed to assist patient with establishing care with a PCP and any necessary medication assistance.  This contact will likely be made after the patient is discharged due to case manager schedule. Patient was extensively counseled on management of her asthma.  Based on the patient's frequency of exacerbations, I think she needs to be on inhaled steroid daily.  A coupon card was found through the [employer] website and I registered the patient for this, with her permission. Return precautions discussed.  Patient voices understanding of these instructions, accepts the plan, and is comfortable with discharge.   Vitals:   03/15/18 0606  03/15/18 0615 03/15/18 0630 03/15/18 0645  BP:  107/84 108/85 107/75  Pulse:  82 75 75  Resp:  12 18 12   Temp:      TempSrc:      SpO2:  98% 95% 100%  Weight: 81.7 kg     Height: 5\' 4"  (1.626 m)        Final Clinical Impressions(s) / ED Diagnoses   Final diagnoses:  Mild persistent asthma with exacerbation    ED Discharge Orders         Ordered    beclomethasone (QVAR) 40 MCG/ACT inhaler  2 times daily     03/15/18 0659           Anselm Pancoast, PA-C 03/15/18 0705    Ward, Layla Maw, DO 03/15/18 [phone]

## 2018-03-15 NOTE — ED Notes (Signed)
ED Provider at bedside. 

## 2018-03-15 NOTE — ED Triage Notes (Signed)
Pt c/o SOB, wheezing, cough for past 2 days. Seen here for similar. Denies fever, swelling to extremities. Cough is productive.

## 2018-03-15 NOTE — ED Notes (Signed)
Reviewed d/c instructions with pt, who verbalized understanding and had no outstanding questions. Pt departed in NAD, refused use of wheelchair.   

## 2018-03-26 ENCOUNTER — Other Ambulatory Visit: Payer: Self-pay

## 2018-03-26 ENCOUNTER — Emergency Department (HOSPITAL_COMMUNITY)
Admission: EM | Admit: 2018-03-26 | Discharge: 2018-03-27 | Disposition: A | Discharge status: Home / Self Care | Payer: Self-pay | Attending: Emergency Medicine | Admitting: Emergency Medicine

## 2018-03-26 ENCOUNTER — Encounter (HOSPITAL_COMMUNITY): Payer: Self-pay

## 2018-03-26 DIAGNOSIS — Z87891 Personal history of nicotine dependence: Secondary | ICD-10-CM | POA: Insufficient documentation

## 2018-03-26 DIAGNOSIS — Z79899 Other long term (current) drug therapy: Secondary | ICD-10-CM | POA: Insufficient documentation

## 2018-03-26 DIAGNOSIS — J4541 Moderate persistent asthma with (acute) exacerbation: Secondary | ICD-10-CM | POA: Insufficient documentation

## 2018-03-26 MED ORDER — ALBUTEROL SULFATE (2.5 MG/3ML) 0.083% IN NEBU
5.0000 mg | INHALATION_SOLUTION | Freq: Once | RESPIRATORY_TRACT | Status: AC
  Administered 2018-03-26: 5 mg via RESPIRATORY_TRACT
  Filled 2018-03-26: qty 6

## 2018-03-26 NOTE — ED Triage Notes (Signed)
Pt reports asthma exacerbation for the past few days, no relief from inhalers. Exp wheezing noted in triage. resp e/u

## 2018-03-27 ENCOUNTER — Emergency Department (HOSPITAL_COMMUNITY): Payer: Self-pay

## 2018-03-27 MED ORDER — PREDNISONE 10 MG PO TABS: 50.0000 mg | ORAL_TABLET | Freq: Every day | ORAL | 0 refills | Status: DC

## 2018-03-27 MED ORDER — ALBUTEROL (5 MG/ML) CONTINUOUS INHALATION SOLN
10.0000 mg/h | INHALATION_SOLUTION | Freq: Once | RESPIRATORY_TRACT | Status: AC
  Administered 2018-03-27: 10 mg/h via RESPIRATORY_TRACT
  Filled 2018-03-27: qty 20

## 2018-03-27 MED ORDER — PREDNISONE 20 MG PO TABS
60.0000 mg | ORAL_TABLET | Freq: Once | ORAL | Status: AC
  Administered 2018-03-27: 60 mg via ORAL
  Filled 2018-03-27: qty 3

## 2018-03-27 MED ORDER — IPRATROPIUM BROMIDE 0.02 % IN SOLN
0.5000 mg | Freq: Once | RESPIRATORY_TRACT | Status: AC
  Administered 2018-03-27: 0.5 mg via RESPIRATORY_TRACT
  Filled 2018-03-27: qty 2.5

## 2018-03-27 NOTE — ED Provider Notes (Signed)
[facility] EMERGENCY DEPARTMENT Provider Note   CSN: [id number] Arrival date & time: 03/26/18  2236     History   Chief Complaint Chief Complaint  Patient presents with  . Asthma  . Headache    HPI Wendy Morgan is a 42 y.o. female.  HPI  42 year old female comes in with chief complaint of shortness of breath. Patient has history of asthma and bronchiectasis.  Patient reports that she has been having shortness of breath and wheezing over the past few days.  She has been taking nebulizer treatment without significant relief.  Prior to ED arrival patient started having sudden severe shortness of breath.  Patient has had a cough with clear phlegm.  Patient denies any fevers, chills.  Pt has no hx of PE, DVT and denies any exogenous hormone (testosterone / estrogen) use, long distance travels or surgery in the past 6 weeks, active cancer, recent immobilization.   Past Medical History:  Diagnosis Date  . Asthma   . Bronchiectasis with acute exacerbation (HCC)     There are no active problems to display for this patient.   History reviewed. No pertinent surgical history.   OB History   None      Home Medications    Prior to Admission medications   Medication Sig Start Date End Date Taking? Authorizing Provider  albuterol (PROVENTIL HFA;VENTOLIN HFA) 108 (90 Base) MCG/ACT inhaler Inhale 1-2 puffs into the lungs every 6 (six) hours as needed for wheezing. 01/02/18   Garlon Hatchet, PA-C  albuterol (PROVENTIL) (5 MG/ML) 0.5% nebulizer solution Take 1 mL (5 mg total) by nebulization every 6 (six) hours as needed for wheezing or shortness of breath. 01/02/18   Garlon Hatchet, PA-C  beclomethasone (QVAR) 40 MCG/ACT inhaler Inhale 1 puff into the lungs 2 (two) times daily. 03/15/18   Joy, Shawn C, PA-C  Cetirizine HCl 10 MG CAPS Take 1 capsule (10 mg total) by mouth daily. 02/12/18   Wieters, Hallie C, PA-C  fluticasone (FLONASE) 50 MCG/ACT nasal spray  Place 1-2 sprays into both nostrils daily for 7 days. Patient not taking: Reported on 02/28/2018 02/12/18 02/28/26  Wieters, Hallie C, PA-C  predniSONE (DELTASONE) 10 MG tablet Take 5 tablets (50 mg total) by mouth daily. 03/27/18   Derwood Kaplan, MD    Family History No family history on file.  Social History Social History   Tobacco Use  . Smoking status: Former Smoker    Last attempt to quit: 1998    Years since quitting: 21.9  . Smokeless tobacco: Never Used  Substance Use Topics  . Alcohol use: No  . Drug use: No     Allergies   Patient has no known allergies.   Review of Systems Review of Systems  Constitutional: Positive for activity change. Negative for fever.  Respiratory: Positive for shortness of breath and wheezing.   Cardiovascular: Positive for chest pain.  Allergic/Immunologic: Negative for immunocompromised state.  All other systems reviewed and are negative.    Physical Exam Updated Vital Signs BP 128/79   Pulse 80   Temp 97.8 F (36.6 C) (Oral)   Resp 20   LMP 02/25/2018   SpO2 100%   Physical Exam  Constitutional: She is oriented to person, place, and time. She appears well-developed.  HENT:  Head: Normocephalic and atraumatic.  Eyes: EOM are normal.  Neck: Normal range of motion. Neck supple.  Cardiovascular: Normal rate.  Pulmonary/Chest: Effort normal. No stridor. No respiratory distress. She  has wheezes. She has no rales.  Abdominal: Bowel sounds are normal.  Neurological: She is alert and oriented to person, place, and time.  Skin: Skin is warm and dry.  Nursing note and vitals reviewed.    ED Treatments / Results  Labs (all labs ordered are listed, but only abnormal results are displayed) Labs Reviewed - No data to display  EKG None  [facility]iology [facility]  Result Date: 03/27/2018 CLINICAL DATA:  42 y/o F; asthma exacerbation. No relief from inhalers. EXAM: PORTABLE CHEST 1 VIEW COMPARISON:  02/20/2018 chest  radiograph. FINDINGS: Stable heart size and mediastinal contours are within normal limits. Both lungs are clear. The visualized skeletal structures are unremarkable. IMPRESSION: No active disease. Electronically Signed   By: Mitzi HansenLance  Furusawa-Stratton M.D.   On: 03/27/2018 01:40    Procedures Procedures (including critical care time)  CRITICAL CARE Performed by: Jaliya Siegmann   Total critical care time: 31 minutes  Critical care time was exclusive of separately billable procedures and treating other patients.  Critical care was necessary to treat or prevent imminent or life-threatening deterioration.  Critical care was time spent personally by me on the following activities: development of treatment plan with patient and/or surrogate as well as nursing, discussions with consultants, evaluation of patient's response to treatment, examination of patient, obtaining history from patient or surrogate, ordering and performing treatments and interventions, ordering and review of laboratory studies, ordering and review of radiographic studies, pulse oximetry and re-evaluation of patient's condition.   Medications Ordered in ED Medications  albuterol (PROVENTIL) (2.5 MG/3ML) 0.083% nebulizer solution 5 mg (5 mg Nebulization Given 03/26/18 2256)  albuterol (PROVENTIL,VENTOLIN) solution continuous neb (10 mg/hr Nebulization Given 03/27/18 0119)  ipratropium (ATROVENT) nebulizer solution 0.5 mg (0.5 mg Nebulization Given 03/27/18 0119)  predniSONE (DELTASONE) tablet 60 mg (60 mg Oral Given 03/27/18 0119)     Initial Impression / Assessment and Plan / ED Course  I have reviewed the triage vital signs and the nursing notes.  Pertinent labs & imaging results that were available during my care of the patient were reviewed by me and considered in my medical decision making (see chart for details).  Clinical Course as of Mar 27 433  Wed Mar 27, 2018  0230 Repeat exam reveals improvement of wheezing  without clearing it. Patient is not in any respiratory distress nor is there hypoxia.  She is feeling comfortable at this time.    [AN]    Clinical Course User Index [AN] Derwood KaplanNanavati, Tajay Muzzy, MD    [age] year old female comes in with chief complaint of wheezing and shortness of breath. She has been taking nebulizer treatment at home with transient relief.  This evening patient had acute exacerbation of her symptoms.  She is noted to have generalized wheezing without any focal rhonchi.  X-ray does not reveal any evidence of focal pneumonia.  Patient has already received nebulizer breathing treatment prior to my assessment and feeling better.  She still having generalized wheezing and therefore we will give her another hour-long treatment and reassess.  It appears that she also might have underlying viral infection that is causing her symptoms.  Final Clinical Impressions(s) / ED Diagnoses   Final diagnoses:  Moderate persistent asthma with acute exacerbation    ED Discharge Orders         Ordered    predniSONE (DELTASONE) 10 MG tablet  Daily     03/27/18 0240           Aaleah Hirsch,  Wendy Runner, MD 03/27/18 [phone]

## 2018-03-27 NOTE — Discharge Instructions (Signed)
We saw you in the ER for your asthma related complains. We gave you some breathing treatments in the ER, and seems like your symptoms have improved. Please take albuterol as needed every 4 hours. Please take the medications prescribed. Please refrain from smoking or smoke exposure. Please see a primary care doctor in 1 week. Return to the ER if your symptoms worsen.  

## 2018-04-06 ENCOUNTER — Encounter (HOSPITAL_COMMUNITY): Payer: Self-pay

## 2018-04-06 ENCOUNTER — Ambulatory Visit (HOSPITAL_COMMUNITY)
Admission: EM | Admit: 2018-04-06 | Discharge: 2018-04-06 | Disposition: A | Discharge status: Home / Self Care | Payer: Self-pay | Attending: Family Medicine | Admitting: Family Medicine

## 2018-04-06 DIAGNOSIS — J4541 Moderate persistent asthma with (acute) exacerbation: Secondary | ICD-10-CM

## 2018-04-06 MED ORDER — IPRATROPIUM-ALBUTEROL 0.5-2.5 (3) MG/3ML IN SOLN
3.0000 mL | Freq: Once | RESPIRATORY_TRACT | Status: AC
  Administered 2018-04-06: 3 mL via RESPIRATORY_TRACT

## 2018-04-06 MED ORDER — PREDNISONE 10 MG (48) PO TBPK: ORAL_TABLET | ORAL | 0 refills | Status: DC

## 2018-04-06 MED ORDER — IPRATROPIUM-ALBUTEROL 0.5-2.5 (3) MG/3ML IN SOLN
RESPIRATORY_TRACT | Status: AC
  Filled 2018-04-06: qty 3

## 2018-04-06 NOTE — ED Triage Notes (Signed)
Pt presents with asthma flare up; wheezing and shortness of breath.

## 2018-04-06 NOTE — ED Provider Notes (Signed)
[facility]   [id number] 04/06/18 Arrival Time: 1301  ASSESSMENT & PLAN:  1. Moderate persistent asthma with exacerbation    Reports moderate improvement with DuoNeb. Much less wheezing on lung exam. Able to speak full sentences without difficulty. SpO2: 97%. No indication for hospital admission at this time. Discussed.  To start today: Meds ordered this encounter  Medications  . predniSONE (STERAPRED UNI-PAK 48 TAB) 10 MG (48) TBPK tablet    Sig: Take as directed.    Dispense:  48 tablet    Refill:  0  . ipratropium-albuterol (DUONEB) 0.5-2.5 (3) MG/3ML nebulizer solution 3 mL (given here x 1)   Asthma precautions given. Will use home nebulizer as needed. OTC symptom care as needed.  Follow-up Information    [facility].   Specialty:  Emergency Medicine Why:  If symptoms worsen. Contact information: [address] [id number] [place] [id number] [phone]         Reviewed expectations re: course of current medical issues. Questions answered. Outlined signs and symptoms indicating need for more acute intervention. Patient verbalized understanding. After Visit Summary given.  SUBJECTIVE: History from: patient.  Wendy Morgan is a 42 y.o. female who presents with complaint of persistent chest tightness and wheezing. Triggers: questions cold weather. Onset abrupt, yesterday. Seen in ED 03/26/2018; note reviewed. Responded to 5 days of prednisone until return of symptoms yesterday. Describes wheezing as moderate to severe when present; sometimes worse with exertion. Fever: no. Overall normal PO intake without n/v. Sick contacts: no. No URI symptoms. Typically her asthma is not well controlled. Does not currently have a PCP but reports is actively seeking to est care. Inhaler use: home nebulizer with temporary help. OTC treatment: none reported.  Received flu shot this year: no.  Social  History   Tobacco Use  Smoking Status Former Smoker  . Last attempt to quit: 1998  . Years since quitting: 21.9  Smokeless Tobacco Never Used   ROS: As per HPI. All other systems negative.   OBJECTIVE:  Vitals:   04/06/18 1428  BP: 118/74  Pulse: 98  Resp: 16  Temp: 97.6 F (36.4 C)  TempSrc: Oral  SpO2: 95%    General appearance: alert; appears fatigued; speaking in short sentences HEENT: mild nasal congestion; [place]; AT Neck: supple without LAD Cv: RRR without murmer Lungs: mildly labored respirations, moderate bilateral expiratory wheezing; cough: mild; mild respiratory distress Abd: soft Ext: no LE edema Skin: warm and dry Psychological: alert and cooperative; normal mood and affect  No Known Allergies  Past Medical History:  Diagnosis Date  . Asthma   . Bronchiectasis with acute exacerbation [facility](HCC)     Social History   Socioeconomic History  . Marital status: Single    Spouse name: Not on file  . Number of children: Not on file  . Years of education: Not on file  . Highest education level: Not on file  Occupational History  . Not on file  Social Needs  . Financial resource strain: Not on file  . Food insecurity:    Worry: Not on file    Inability: Not on file  . Transportation needs:    Medical: Not on file    Non-medical: Not on file  Tobacco Use  . Smoking status: Former Smoker    Last attempt to quit: 1998    Years since quitting: 21.9  . Smokeless tobacco: Never Used  Substance and Sexual Activity  . Alcohol use:  No  . Drug use: No  . Sexual activity: Not on file  Lifestyle  . Physical activity:    Days per week: Not on file    Minutes per session: Not on file  . Stress: Not on file  Relationships  . Social connections:    Talks on phone: Not on file    Gets together: Not on file    Attends religious service: Not on file    Active member of club or organization: Not on file    Attends meetings of clubs or organizations: Not on file      Relationship status: Not on file  . Intimate partner violence:    Fear of current or ex partner: Not on file    Emotionally abused: Not on file    Physically abused: Not on file    Forced sexual activity: Not on file  Other Topics Concern  . Not on file  Social History Narrative  . Not on file            Mardella LaymanHagler, Janiece Scovill, MD 04/08/18 [phone]

## 2018-04-06 NOTE — ED Notes (Signed)
Medication given by Efraim Kaufmannmelissa white, cma

## 2018-04-23 ENCOUNTER — Encounter (HOSPITAL_COMMUNITY): Payer: Self-pay | Admitting: Emergency Medicine

## 2018-04-23 ENCOUNTER — Ambulatory Visit (HOSPITAL_COMMUNITY)
Admission: EM | Admit: 2018-04-23 | Discharge: 2018-04-23 | Disposition: A | Discharge status: Home / Self Care | Payer: Self-pay | Attending: Emergency Medicine | Admitting: Emergency Medicine

## 2018-04-23 DIAGNOSIS — J4541 Moderate persistent asthma with (acute) exacerbation: Secondary | ICD-10-CM

## 2018-04-23 DIAGNOSIS — J45901 Unspecified asthma with (acute) exacerbation: Secondary | ICD-10-CM | POA: Insufficient documentation

## 2018-04-23 MED ORDER — ALBUTEROL SULFATE (5 MG/ML) 0.5% IN NEBU: 5.0000 mg | INHALATION_SOLUTION | Freq: Four times a day (QID) | RESPIRATORY_TRACT | 0 refills | Status: DC | PRN

## 2018-04-23 MED ORDER — MONTELUKAST SODIUM 10 MG PO TABS: 10.0000 mg | ORAL_TABLET | Freq: Every day | ORAL | 0 refills | Status: DC

## 2018-04-23 MED ORDER — ALBUTEROL SULFATE HFA 108 (90 BASE) MCG/ACT IN AERS: 1.0000 | INHALATION_SPRAY | RESPIRATORY_TRACT | 0 refills | Status: DC | PRN

## 2018-04-23 MED ORDER — IPRATROPIUM-ALBUTEROL 0.5-2.5 (3) MG/3ML IN SOLN
3.0000 mL | Freq: Once | RESPIRATORY_TRACT | Status: AC
  Administered 2018-04-23: 3 mL via RESPIRATORY_TRACT

## 2018-04-23 MED ORDER — DEXAMETHASONE 4 MG PO TABS: ORAL_TABLET | ORAL | 0 refills | Status: DC

## 2018-04-23 MED ORDER — DEXAMETHASONE 4 MG PO TABS
16.0000 mg | ORAL_TABLET | Freq: Once | ORAL | Status: AC
  Administered 2018-04-23: 16 mg via ORAL

## 2018-04-23 MED ORDER — IPRATROPIUM-ALBUTEROL 0.5-2.5 (3) MG/3ML IN SOLN
RESPIRATORY_TRACT | Status: AC
  Filled 2018-04-23: qty 3

## 2018-04-23 MED ORDER — BUDESONIDE-FORMOTEROL FUMARATE 80-4.5 MCG/ACT IN AERO: 2.0000 | INHALATION_SPRAY | Freq: Two times a day (BID) | RESPIRATORY_TRACT | 1 refills | Status: DC

## 2018-04-23 MED ORDER — DEXAMETHASONE 2 MG PO TABS
ORAL_TABLET | ORAL | Status: AC
  Filled 2018-04-23: qty 2

## 2018-04-23 MED ORDER — DEXAMETHASONE 4 MG PO TABS
ORAL_TABLET | ORAL | Status: AC
  Filled 2018-04-23: qty 3

## 2018-04-23 MED ORDER — AEROCHAMBER PLUS MISC: 2 refills | Status: DC

## 2018-04-23 NOTE — ED Provider Notes (Signed)
HPI  SUBJECTIVE:  Wendy Morgan is a 43 y.o. female who presents with worsening coughing, wheezing, chest tightness, shortness of breath, dyspnea on exertion for the past 3 to 4 days.  States that she is using her albuterol inhaler with her spacer 10 times or more a day for the past 3 or 4 days.  Ran out of her albuterol today.  She thinks that this was triggered by the cold air.  No fevers.  No nasal congestion, postnasal drip, sinus pain or pressure, chest pain.  No alleviating factors.  Symptoms are worse with exposure to cold air, smoke.  She has a past medical history of asthma and has actually been seen in the ER/urgent care 5 times for her asthma since mid November 2019.  States that she is unable to afford the Qvar that has been prescribed to her.  She is not on any Singulair.  She states that this is identical to previous asthma exacerbations.  Her last steroid burst was at the end of December.  She has never been admitted or intubated.  No history of diabetes, hypertension.  LMP: Last month.  Denies possibility of being pregnant.  PMD: None.  Past Medical History:  Diagnosis Date  . Asthma   . Bronchiectasis with acute exacerbation (HCC)     History reviewed. No pertinent surgical history.  History reviewed. No pertinent family history.  Social History   Tobacco Use  . Smoking status: Former Smoker    Last attempt to quit: 1998    Years since quitting: 22.0  . Smokeless tobacco: Never Used  Substance Use Topics  . Alcohol use: No  . Drug use: No    No current facility-administered medications for this encounter.   Current Outpatient Medications:  .  albuterol (PROVENTIL HFA;VENTOLIN HFA) 108 (90 Base) MCG/ACT inhaler, Inhale 1-2 puffs into the lungs every 4 (four) hours as needed for wheezing or shortness of breath., Disp: 1 Inhaler, Rfl: 0 .  albuterol (PROVENTIL) (5 MG/ML) 0.5% nebulizer solution, Take 1 mL (5 mg total) by nebulization every 6 (six) hours as needed for  wheezing or shortness of breath., Disp: 20 mL, Rfl: 0 .  budesonide-formoterol (SYMBICORT) 80-4.5 MCG/ACT inhaler, Inhale 2 puffs into the lungs 2 (two) times daily., Disp: 1 Inhaler, Rfl: 1 .  Cetirizine HCl 10 MG CAPS, Take 1 capsule (10 mg total) by mouth daily., Disp: 15 capsule, Rfl: 0 .  dexamethasone (DECADRON) 4 MG tablet, 4 tablets (16 mg) po at once tomorrow, Disp: 4 tablet, Rfl: 0 .  montelukast (SINGULAIR) 10 MG tablet, Take 1 tablet (10 mg total) by mouth at bedtime., Disp: 30 tablet, Rfl: 0 .  Spacer/Aero-Holding Chambers (AEROCHAMBER PLUS) inhaler, Use as instructed, Disp: 1 each, Rfl: 2  No Known Allergies   ROS  As noted in HPI.   Physical Exam  BP 127/87   Pulse 79   Temp 98.1 F (36.7 C)   Resp 18   LMP 04/14/2018   SpO2 99%   Constitutional: Well developed, well nourished, no acute distress.  Speaking in full sentences. Eyes:  EOMI, conjunctiva normal bilaterally HENT: Normocephalic, atraumatic,mucus membranes moist Respiratory: Normal inspiratory effort, poor air movement, prolonged expiratory phase, wheezing throughout all lung fields.  No rales, rhonchi. Cardiovascular: Normal rate, regular rhythm no murmurs, rubs, gallops GI: nondistended skin: No rash, skin intact Musculoskeletal: no deformities Neurologic: Alert & oriented x 3, no focal neuro deficits Psychiatric: Speech and behavior appropriate   ED Course   Medications  ipratropium-albuterol (DUONEB) 0.5-2.5 (3) MG/3ML nebulizer solution 3 mL (3 mLs Nebulization Given 04/23/18 2022)  dexamethasone (DECADRON) tablet 16 mg (16 mg Oral Given 04/23/18 2022)    Orders Placed This Encounter  Procedures  . Peak flow    Standing Status:   Standing    Number of Occurrences:   1    Order Specific Question:   Pre and post Neb    Answer:   Yes    No results found for this or any previous visit (from the past 24 hour(s)). No results found.  ED Clinical Impression  Moderate asthma with exacerbation,  unspecified whether persistent   ED Assessment/Plan   Calculated peak flow: 420 Pretreatment: 100/170/130 Post treatment #1 240/290/340.  Patient states she feels significantly better.  Decreased wheezing.  Improved air movement.  States that she is ready to go home.   Presentation consistent with an asthma exacerbation.  Patient has poor peak flows.  Giving DuoNeb.  Discussed steroid options with patient.  She has opted for 16 mg dexamethasone for 2 days.  Giving 16 mg dexamethasone orally x1 here.  Will reevaluate.  Patient has poorly controlled asthma and is not on any long-term controller medications.  States that she cannot afford the Qvar.  We will start her on Singulair and try her on some Symbicort.  Will refill her albuterol inhaler and give her another spacer.  16 mg of dexamethasone tomorrow.  2 puffs from her albuterol inhaler every 4 hours for the next 2 days, then back off to every 6 hours, then to as needed.  She is to keep a log of her peak flows.  Will provide a primary care referral list and recommended the [facility] clinic.  Discussed with her that she needs a primary care physician for ongoing management.  Discussed  MDM, treatment plan, and plan for follow-up with patient. Discussed sn/sx that should prompt return to the ED. patient agrees with plan.   Meds ordered this encounter  Medications  . ipratropium-albuterol (DUONEB) 0.5-2.5 (3) MG/3ML nebulizer solution 3 mL  . dexamethasone (DECADRON) tablet 16 mg  . albuterol (PROVENTIL) (5 MG/ML) 0.5% nebulizer solution    Sig: Take 1 mL (5 mg total) by nebulization every 6 (six) hours as needed for wheezing or shortness of breath.    Dispense:  20 mL    Refill:  0  . dexamethasone (DECADRON) 4 MG tablet    Sig: 4 tablets (16 mg) po at once tomorrow    Dispense:  4 tablet    Refill:  0  . Spacer/Aero-Holding Chambers (AEROCHAMBER PLUS) inhaler    Sig: Use as instructed    Dispense:  1 each    Refill:  2  .  montelukast (SINGULAIR) 10 MG tablet    Sig: Take 1 tablet (10 mg total) by mouth at bedtime.    Dispense:  30 tablet    Refill:  0  . budesonide-formoterol (SYMBICORT) 80-4.5 MCG/ACT inhaler    Sig: Inhale 2 puffs into the lungs 2 (two) times daily.    Dispense:  1 Inhaler    Refill:  1  . albuterol (PROVENTIL HFA;VENTOLIN HFA) 108 (90 Base) MCG/ACT inhaler    Sig: Inhale 1-2 puffs into the lungs every 4 (four) hours as needed for wheezing or shortness of breath.    Dispense:  1 Inhaler    Refill:  0    *This clinic note was created using Scientist, clinical (histocompatibility and immunogenetics). Therefore, there may be occasional mistakes despite  careful proofreading.   ?   Domenick Gong, MD 04/23/18 2054

## 2018-04-23 NOTE — ED Triage Notes (Signed)
Pt c/o asthma flair up today, ran out of her inhaler.

## 2018-04-23 NOTE — Discharge Instructions (Addendum)
Take two puffs from your albuterol inhaler every 4 hours.   Do a peak flow, 3 times in the morning and times at night. Write this down. The number should be going up, not down. You may decrease the frequency of your albuterol inhaler as the numbers go up and you start feeling better. You may start the steroids tomorrow, as you have already taken today's dose.  Start the Singulair and the Symbicort.  Make sure you drink extra fluids. Return to the ER if you get worse, have a fever >100.4, or any other concerns, or for the signs and symptoms we discussed  Below is a list of primary care practices who are taking new patients for you to follow-up with. [facility] [address] [place], [place] [address] [phone]  [facility] [phone] [address] [place] [address]  [facility]: [address] [place] [address]  [phone]  [facility]: [address] [place] [address]   [phone]  [facility]: [address] [place] [address]  [phone]  [facility] : [address] [place] [address] [phone]  [facility]: [address] [place] [address] [phone]  [facility]: [address] [place] [address] [phone]  Dr. Oneal Grout [address] [facility] [place] [address]  [phone]  Dr. Jackie Plum, [facility]. [address] [place], [place] [address]  [phone]  Go to www.goodrx.com to look up your medications. This will give you a list of where you can find your prescriptions at the most affordable prices. Or ask the pharmacist what the cash price is, or if they have any other discount  programs available to help make your medication more affordable. This can be less expensive than what you would pay with insurance.

## 2018-04-27 ENCOUNTER — Encounter (HOSPITAL_COMMUNITY): Payer: Self-pay

## 2018-04-27 ENCOUNTER — Emergency Department (HOSPITAL_COMMUNITY)
Admission: EM | Admit: 2018-04-27 | Discharge: 2018-04-27 | Disposition: A | Discharge status: Home / Self Care | Payer: Self-pay | Attending: Emergency Medicine | Admitting: Emergency Medicine

## 2018-04-27 DIAGNOSIS — R0789 Other chest pain: Secondary | ICD-10-CM | POA: Insufficient documentation

## 2018-04-27 DIAGNOSIS — R0602 Shortness of breath: Secondary | ICD-10-CM | POA: Insufficient documentation

## 2018-04-27 DIAGNOSIS — J4541 Moderate persistent asthma with (acute) exacerbation: Secondary | ICD-10-CM | POA: Insufficient documentation

## 2018-04-27 DIAGNOSIS — Z79899 Other long term (current) drug therapy: Secondary | ICD-10-CM | POA: Insufficient documentation

## 2018-04-27 DIAGNOSIS — Z87891 Personal history of nicotine dependence: Secondary | ICD-10-CM | POA: Insufficient documentation

## 2018-04-27 IMAGING — CR DG CHEST 2V
2 series · 2 of 2 positions shown · non-contrast
Comparison: Chest radiograph 02/15/2017

CLINICAL DATA: Shortness of breath and cough.  Chest pain.

EXAM:
CHEST  2 VIEW

[chest pa]
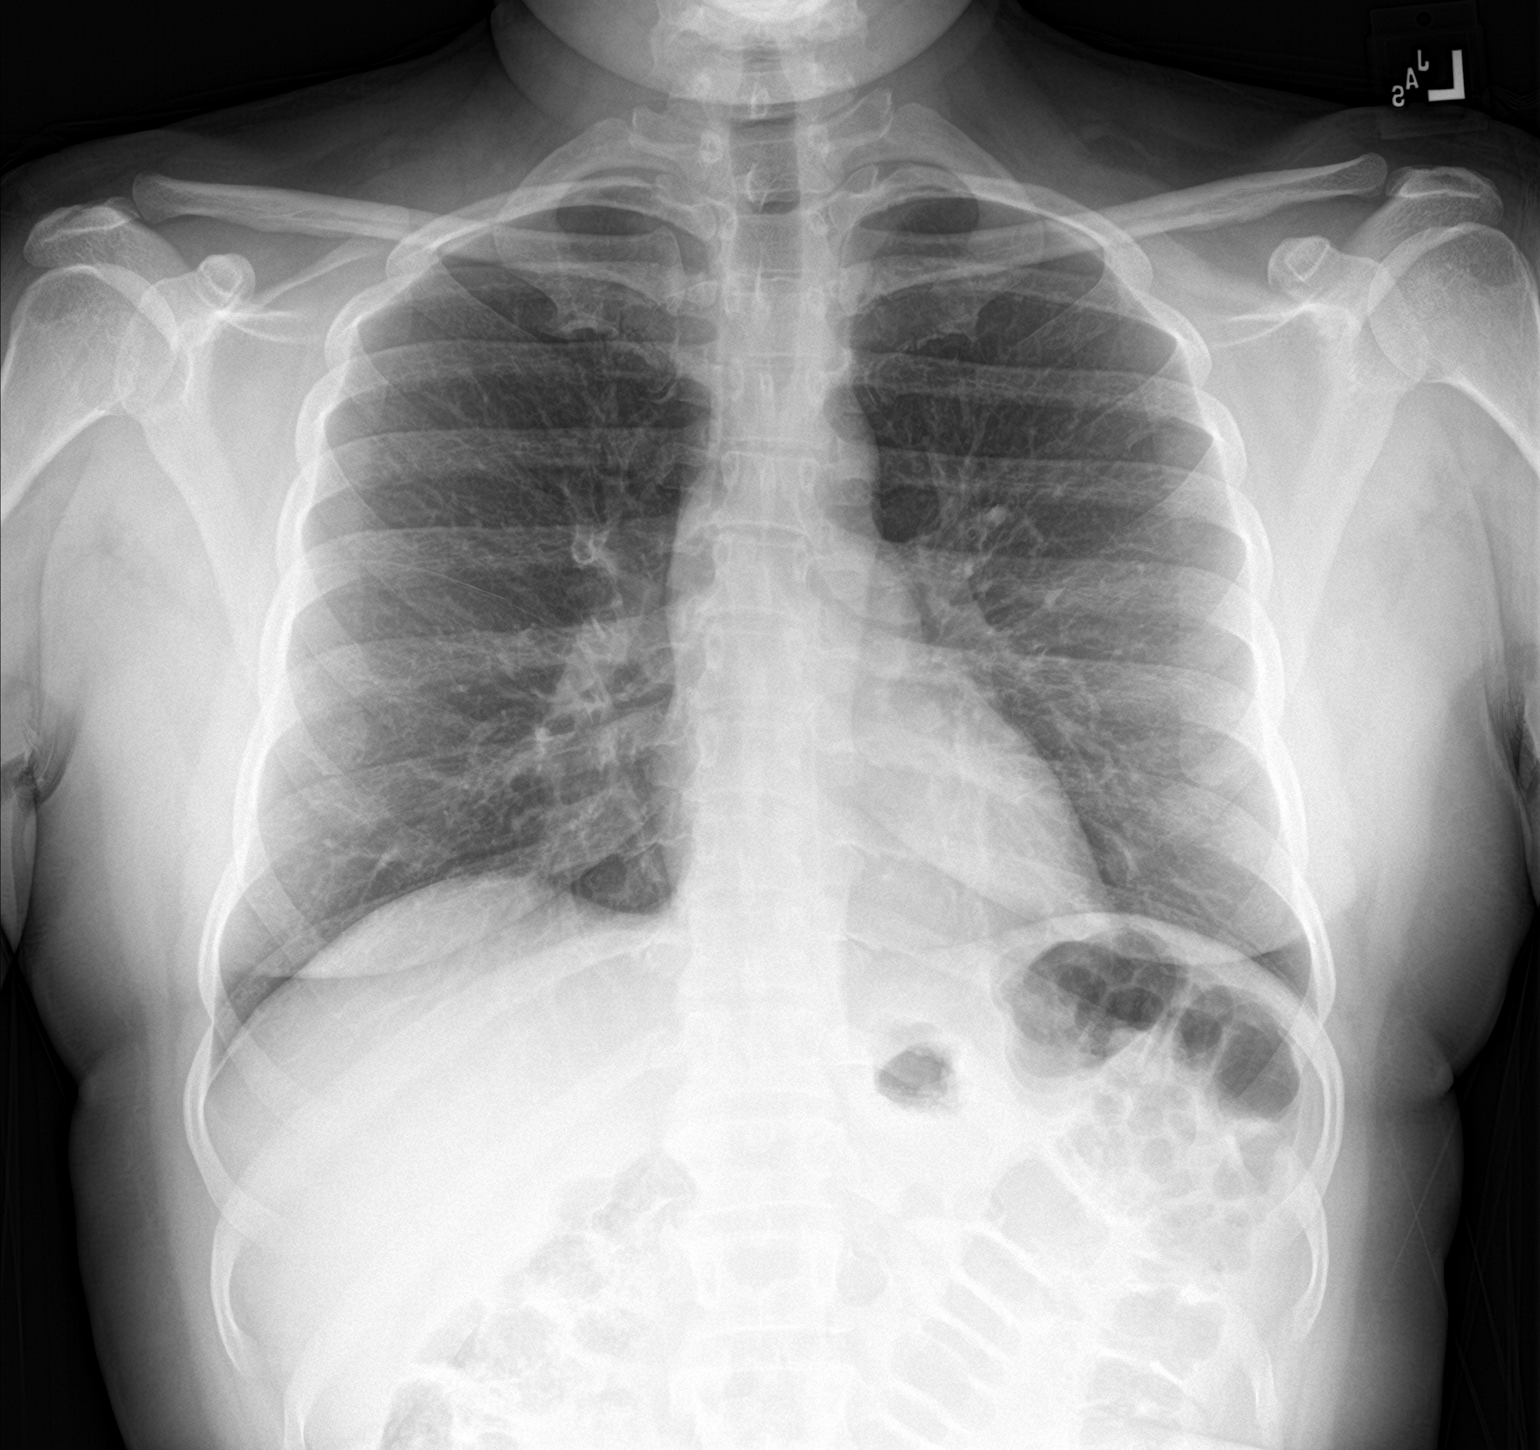

[chest lat]
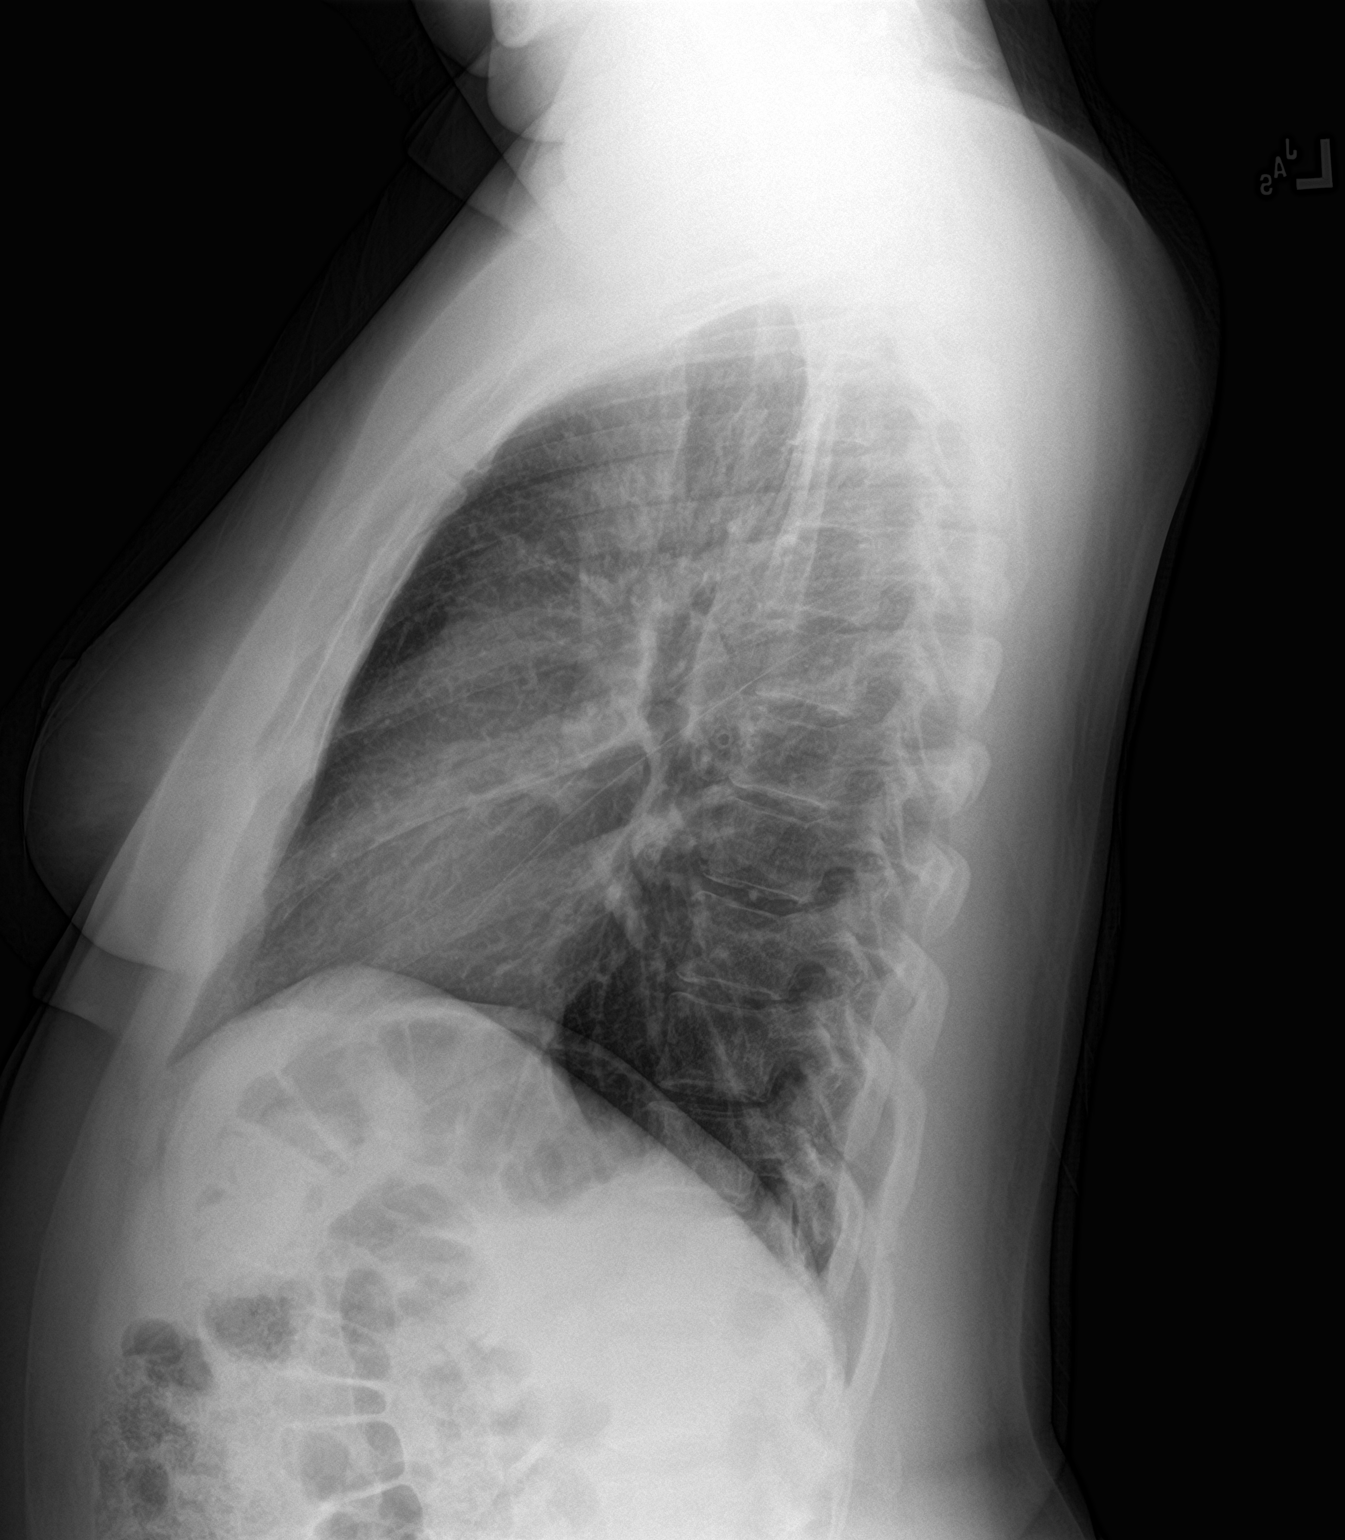

[2 of 2 positions shown; findings below may reference images not displayed]

FINDINGS: The heart size and mediastinal contours are within normal limits.
Both lungs are clear. The visualized skeletal structures are
unremarkable.
IMPRESSION: No active cardiopulmonary disease.

## 2018-04-27 MED ORDER — ALBUTEROL SULFATE (2.5 MG/3ML) 0.083% IN NEBU
5.0000 mg | INHALATION_SOLUTION | Freq: Once | RESPIRATORY_TRACT | Status: AC
  Administered 2018-04-27: 5 mg via RESPIRATORY_TRACT

## 2018-04-27 MED ORDER — ALBUTEROL SULFATE (2.5 MG/3ML) 0.083% IN NEBU
INHALATION_SOLUTION | RESPIRATORY_TRACT | Status: AC
  Filled 2018-04-27: qty 6

## 2018-04-27 NOTE — Discharge Instructions (Addendum)
Take your medications as prescribed from previous urgent care visit.  Follow-up with your primary care provider for further management.  Return to ER as needed.

## 2018-04-27 NOTE — ED Triage Notes (Signed)
Pt presents for asthma flare, wheezing and recent URI symptoms.

## 2018-04-27 NOTE — ED Provider Notes (Signed)
[facility] EMERGENCY DEPARTMENT Provider Note   CSN: [id number] Arrival date & time: 04/27/18  1[facility]432     History   Chief Complaint Chief Complaint  Patient presents with  . Asthma    HPI Wendy Morgan is a 43 y.o. female.  43 year old female with history of poorly controlled asthma comes to ER for asthma exacerbation.  Patient was seen in urgent care for same 4 days ago, improved after neb treatments and was discharged home after a dose of Decadron with prescription for repeat dose of Decadron, Singulair, Symbicort, refill of her inhaler.  Patient states that she has been unable to go to the pharmacy to pick up her prescriptions.  Patient denies fevers or any other sick symptoms, states similar to previous asthma exacerbations.  Patient has 18 ER visits in the past 6 months related to her asthma, not requiring hospitalization or intubation.  Former smoker, no other complaints or concerns.  Patient is prescribed Qvar however unable to afford this medication.     Past Medical History:  Diagnosis Date  . Asthma   . Bronchiectasis with acute exacerbation (HCC)     There are no active problems to display for this patient.   History reviewed. No pertinent surgical history.   OB History   No obstetric history on file.      Home Medications    Prior to Admission medications   Medication Sig Start Date End Date Taking? Authorizing Provider  albuterol (PROVENTIL HFA;VENTOLIN HFA) 108 (90 Base) MCG/ACT inhaler Inhale 1-2 puffs into the lungs every 4 (four) hours as needed for wheezing or shortness of breath. 04/23/18   Domenick GongMortenson, Ashley, MD  albuterol (PROVENTIL) (5 MG/ML) 0.5% nebulizer solution Take 1 mL (5 mg total) by nebulization every 6 (six) hours as needed for wheezing or shortness of breath. 04/23/18   Domenick GongMortenson, Ashley, MD  budesonide-formoterol (SYMBICORT) 80-4.5 MCG/ACT inhaler Inhale 2 puffs into the lungs 2 (two) times daily. 04/23/18   Domenick GongMortenson,  Ashley, MD  Cetirizine HCl 10 MG CAPS Take 1 capsule (10 mg total) by mouth daily. 02/12/18   Wieters, Hallie C, PA-C  dexamethasone (DECADRON) 4 MG tablet 4 tablets (16 mg) po at once tomorrow 04/23/18   Domenick GongMortenson, Ashley, MD  montelukast (SINGULAIR) 10 MG tablet Take 1 tablet (10 mg total) by mouth at bedtime. 04/23/18   Domenick GongMortenson, Ashley, MD  Spacer/Aero-Holding Chambers (AEROCHAMBER PLUS) inhaler Use as instructed 04/23/18   Domenick GongMortenson, Ashley, MD    Family History No family history on file.  Social History Social History   Tobacco Use  . Smoking status: Former Smoker    Last attempt to quit: 1998    Years since quitting: 22.0  . Smokeless tobacco: Never Used  Substance Use Topics  . Alcohol use: No  . Drug use: No     Allergies   Patient has no known allergies.   Review of Systems Review of Systems  Constitutional: Negative for chills and fever.  HENT: Negative for congestion, rhinorrhea, sinus pressure and sinus pain.   Respiratory: Positive for cough, chest tightness, shortness of breath and wheezing.   Skin: Negative for rash and wound.  Allergic/Immunologic: Negative for immunocompromised state.  Neurological: Negative for dizziness and weakness.  Hematological: Negative for adenopathy.  Psychiatric/Behavioral: Negative for confusion.  All other systems reviewed and are negative.    Physical Exam Updated Vital Signs BP (!) 129/98 (BP Location: Right Arm)   Pulse 80   Temp 97.7 F (36.5 C) (Oral)  Resp 20   LMP 04/14/2018   SpO2 99%   Physical Exam Vitals signs and nursing note reviewed.  Constitutional:      Appearance: Normal appearance.  HENT:     Head: Normocephalic and atraumatic.     Right Ear: Tympanic membrane and ear canal normal.     Left Ear: Tympanic membrane and ear canal normal.     Nose: Nose normal. No congestion or rhinorrhea.     Mouth/Throat:     Mouth: Mucous membranes are moist.  Eyes:     Conjunctiva/sclera: Conjunctivae normal.    Cardiovascular:     Rate and Rhythm: Normal rate and regular rhythm.     Pulses: Normal pulses.  Pulmonary:     Effort: Pulmonary effort is normal.     Breath sounds: Wheezing present.  Lymphadenopathy:     Cervical: No cervical adenopathy.  Skin:    General: Skin is warm and dry.  Neurological:     Mental Status: She is alert and oriented to person, place, and time.  Psychiatric:        Behavior: Behavior normal.      ED Treatments / Results  Labs (all labs ordered are listed, but only abnormal results are displayed) Labs Reviewed - No data to display  EKG None  Radiology No results found.  Procedures Procedures (including critical care time)  Medications Ordered in ED Medications  albuterol (PROVENTIL) (2.5 MG/3ML) 0.083% nebulizer solution 5 mg (5 mg Nebulization Given 04/27/18 1439)     Initial Impression / Assessment and Plan / ED Course  I have reviewed the triage vital signs and the nursing notes.  Pertinent labs & imaging results that were available during my care of the patient were reviewed by me and considered in my medical decision making (see chart for details).  Clinical Course as of Apr 28 1523  Sat Apr 27, 2018  3837 43 year old female with history of asthma presents with exacerbation.  Patient was given neb upon arrival and symptoms have resolved.  Patient was prescribed medications from urgent care 4 days ago which she has not done from her pharmacy.  Patient plans to pick up her prescriptions and take as prescribed.   [LM]  1524 Repeat lung exam, significant improvement in lung sounds, mild expiratory wheezing at this time.    [LM]    Clinical Course User Index [LM] Jeannie Fend, PA-C   Final Clinical Impressions(s) / ED Diagnoses   Final diagnoses:  Moderate persistent asthma with exacerbation    ED Discharge Orders    None       Alden Hipp 04/27/18 1525    Terrilee Files, MD 04/27/18 [phone]

## 2018-05-07 ENCOUNTER — Emergency Department (HOSPITAL_COMMUNITY)
Admission: EM | Admit: 2018-05-07 | Discharge: 2018-05-07 | Disposition: A | Discharge status: Home / Self Care | Payer: Self-pay | Attending: Emergency Medicine | Admitting: Emergency Medicine

## 2018-05-07 ENCOUNTER — Other Ambulatory Visit: Payer: Self-pay

## 2018-05-07 ENCOUNTER — Encounter (HOSPITAL_COMMUNITY): Payer: Self-pay | Admitting: Emergency Medicine

## 2018-05-07 DIAGNOSIS — Z87891 Personal history of nicotine dependence: Secondary | ICD-10-CM | POA: Insufficient documentation

## 2018-05-07 DIAGNOSIS — J4541 Moderate persistent asthma with (acute) exacerbation: Secondary | ICD-10-CM | POA: Insufficient documentation

## 2018-05-07 DIAGNOSIS — Z79899 Other long term (current) drug therapy: Secondary | ICD-10-CM | POA: Insufficient documentation

## 2018-05-07 MED ORDER — IPRATROPIUM-ALBUTEROL 0.5-2.5 (3) MG/3ML IN SOLN
3.0000 mL | Freq: Once | RESPIRATORY_TRACT | Status: AC
  Administered 2018-05-07: 3 mL via RESPIRATORY_TRACT
  Filled 2018-05-07: qty 3

## 2018-05-07 MED ORDER — DEXAMETHASONE 4 MG PO TABS
4.0000 mg | ORAL_TABLET | Freq: Once | ORAL | Status: AC
  Administered 2018-05-07: 4 mg via ORAL
  Filled 2018-05-07: qty 1

## 2018-05-07 MED ORDER — ALBUTEROL SULFATE (2.5 MG/3ML) 0.083% IN NEBU
2.5000 mg | INHALATION_SOLUTION | Freq: Once | RESPIRATORY_TRACT | Status: AC
  Administered 2018-05-07: 2.5 mg via RESPIRATORY_TRACT
  Filled 2018-05-07: qty 3

## 2018-05-07 NOTE — Discharge Instructions (Signed)
Continue to use your inhalers, as prescribed. Follow-up with the primary care provider, as planned. Return to the ED for shortness of breath, chest pain, worsening symptoms, or any other major complaints.

## 2018-05-07 NOTE — ED Notes (Signed)
Patient verbalizes understanding of discharge instructions. Opportunity for questioning and answers were provided. Armband removed by staff, pt discharged from ED. Pt ambulatory to lobby.  

## 2018-05-07 NOTE — ED Provider Notes (Signed)
[facility] EMERGENCY DEPARTMENT Provider Note   CSN: [id number] Arrival date & time: 05/07/18  1546     History   Chief Complaint Chief Complaint  Patient presents with  . Asthma    HPI Holden JAIA HIGNIGHT is a 43 y.o. female.  HPI   BRYNLYN BAFFORD is a 43 y.o. female, with a history of asthma, presenting to the ED with asthma exacerbation with increased wheezing for the past several days.  She has used her home albuterol inhalers, but today they have not been as effective.  Minimal shortness of breath.  She states she has PCP follow-up in February.  Last steroid usage was 2 weeks ago. Denies fever/chills, nausea/vomiting, chest pain, increased cough, or any other complaints.    Past Medical History:  Diagnosis Date  . Asthma   . Bronchiectasis with acute exacerbation (HCC)     There are no active problems to display for this patient.   History reviewed. No pertinent surgical history.   OB History   No obstetric history on file.      Home Medications    Prior to Admission medications   Medication Sig Start Date End Date Taking? Authorizing Provider  albuterol (PROVENTIL HFA;VENTOLIN HFA) 108 (90 Base) MCG/ACT inhaler Inhale 1-2 puffs into the lungs every 4 (four) hours as needed for wheezing or shortness of breath. 04/23/18   Domenick Gong, MD  albuterol (PROVENTIL) (5 MG/ML) 0.5% nebulizer solution Take 1 mL (5 mg total) by nebulization every 6 (six) hours as needed for wheezing or shortness of breath. 04/23/18   Domenick Gong, MD  budesonide-formoterol (SYMBICORT) 80-4.5 MCG/ACT inhaler Inhale 2 puffs into the lungs 2 (two) times daily. 04/23/18   Domenick Gong, MD  Cetirizine HCl 10 MG CAPS Take 1 capsule (10 mg total) by mouth daily. 02/12/18   Wieters, Hallie C, PA-C  dexamethasone (DECADRON) 4 MG tablet 4 tablets (16 mg) po at once tomorrow 04/23/18   Domenick Gong, MD  montelukast (SINGULAIR) 10 MG tablet Take 1 tablet (10 mg total)  by mouth at bedtime. 04/23/18   Domenick Gong, MD  Spacer/Aero-Holding Chambers (AEROCHAMBER PLUS) inhaler Use as instructed 04/23/18   Domenick Gong, MD    Family History No family history on file.  Social History Social History   Tobacco Use  . Smoking status: Former Smoker    Last attempt to quit: 1998    Years since quitting: 22.0  . Smokeless tobacco: Never Used  Substance Use Topics  . Alcohol use: No  . Drug use: No     Allergies   Patient has no known allergies.   Review of Systems Review of Systems  Constitutional: Negative for chills, diaphoresis and fever.  HENT: Negative for congestion.   Respiratory: Positive for wheezing. Negative for cough.   Cardiovascular: Negative for chest pain.  Gastrointestinal: Negative for abdominal pain, nausea and vomiting.  All other systems reviewed and are negative.    Physical Exam Updated Vital Signs BP 117/85 (BP Location: Right Arm)   Pulse 87   Temp 97.7 F (36.5 C) (Oral)   Resp 18   LMP 04/14/2018   SpO2 98%   Physical Exam Vitals signs and nursing note reviewed.  Constitutional:      General: She is not in acute distress.    Appearance: She is well-developed. She is not diaphoretic.  HENT:     Head: Normocephalic and atraumatic.     Mouth/Throat:     Mouth: Mucous membranes are  moist.     Pharynx: Oropharynx is clear.  Eyes:     Conjunctiva/sclera: Conjunctivae normal.  Neck:     Musculoskeletal: Neck supple.  Cardiovascular:     Rate and Rhythm: Normal rate and regular rhythm.     Pulses: Normal pulses.  Pulmonary:     Effort: Pulmonary effort is normal. No respiratory distress.     Breath sounds: Wheezing (global, inspiratory and expiratory) present.     Comments: Despite the patient's wheezing, she does not have any increased work of breathing.  She speaks in full sentences without noted difficulty. Abdominal:     Tenderness: There is no guarding.  Musculoskeletal:     Right lower leg: No  edema.     Left lower leg: No edema.  Lymphadenopathy:     Cervical: No cervical adenopathy.  Skin:    General: Skin is warm and dry.  Neurological:     Mental Status: She is alert.  Psychiatric:        Mood and Affect: Mood and affect normal.        Speech: Speech normal.        Behavior: Behavior normal.      ED Treatments / Results  Labs (all labs ordered are listed, but only abnormal results are displayed) Labs Reviewed - No data to display  EKG None  Radiology No results found.  Procedures Procedures (including critical care time)  Medications Ordered in ED Medications  dexamethasone (DECADRON) tablet 4 mg (4 mg Oral Given 05/07/18 1706)  ipratropium-albuterol (DUONEB) 0.5-2.5 (3) MG/3ML nebulizer solution 3 mL (3 mLs Nebulization Given 05/07/18 1706)  albuterol (PROVENTIL) (2.5 MG/3ML) 0.083% nebulizer solution 2.5 mg (2.5 mg Nebulization Given 05/07/18 1707)     Initial Impression / Assessment and Plan / ED Course  I have reviewed the triage vital signs and the nursing notes.  Pertinent labs & imaging results that were available during my care of the patient were reviewed by me and considered in my medical decision making (see chart for details).  Clinical Course as of May 07 2340  Tue May 07, 2018  1754 Patient states she feels much better and is ready to go home.  Wheezing has improved, expiratory in all fields.   [SJ]    Clinical Course User Index [SJ] ,  C, PA-C    Patient presents with wheezing.  Suspect asthma exacerbation.  Patient is here in the ED frequently for the same complaint. Patient is nontoxic appearing, afebrile, not tachycardic, not tachypneic, not hypotensive, maintains excellent SPO2 on room air, and is in no apparent distress. PCP follow-up, as planned. The patient was given instructions for home care as well as return precautions. Patient voices understanding of these instructions, accepts the plan, and is comfortable with  discharge.   Vitals:   05/07/18 1624 05/07/18 1811  BP: 117/85 120/76  Pulse: 87 88  Resp: 18 14  Temp: 97.7 F (36.5 C)   TempSrc: Oral   SpO2: 98% 100%  Weight:  72.6 kg  Height:  5\' 4"  (1.626 m)     Final Clinical Impressions(s) / ED Diagnoses   Final diagnoses:  Moderate persistent asthma with exacerbation    ED Discharge Orders    None       Concepcion Living,  C, PA-C 05/07/18 2343    Tilden Fossaees, Elizabeth, MD 05/09/18 [phone]

## 2018-05-07 NOTE — ED Triage Notes (Signed)
Pt presents with asthma flare up. Pt has used rescue inhaler at home with no relief

## 2018-05-09 ENCOUNTER — Emergency Department (HOSPITAL_COMMUNITY): Payer: Self-pay

## 2018-05-09 ENCOUNTER — Emergency Department (HOSPITAL_COMMUNITY)
Admission: EM | Admit: 2018-05-09 | Discharge: 2018-05-09 | Disposition: A | Discharge status: Home / Self Care | Payer: Self-pay | Attending: Emergency Medicine | Admitting: Emergency Medicine

## 2018-05-09 ENCOUNTER — Encounter (HOSPITAL_COMMUNITY): Payer: Self-pay

## 2018-05-09 DIAGNOSIS — Z87891 Personal history of nicotine dependence: Secondary | ICD-10-CM | POA: Insufficient documentation

## 2018-05-09 DIAGNOSIS — J4521 Mild intermittent asthma with (acute) exacerbation: Secondary | ICD-10-CM | POA: Insufficient documentation

## 2018-05-09 DIAGNOSIS — Z79899 Other long term (current) drug therapy: Secondary | ICD-10-CM | POA: Insufficient documentation

## 2018-05-09 LAB — CBC WITH DIFFERENTIAL/PLATELET
Abs Immature Granulocytes: 0.11 10*3/uL — ABNORMAL HIGH (ref 0.00–0.07)
Basophils Absolute: 0.1 10*3/uL (ref 0.0–0.1)
Basophils Relative: 1 %
EOS PCT: 5 %
Eosinophils Absolute: 0.8 10*3/uL — ABNORMAL HIGH (ref 0.0–0.5)
HCT: 43.5 % (ref 36.0–46.0)
Hemoglobin: 13.9 g/dL (ref 12.0–15.0)
Immature Granulocytes: 1 %
Lymphocytes Relative: 31 %
Lymphs Abs: 5 10*3/uL — ABNORMAL HIGH (ref 0.7–4.0)
MCH: 28.8 pg (ref 26.0–34.0)
MCHC: 32 g/dL (ref 30.0–36.0)
MCV: 90.1 fL (ref 80.0–100.0)
MONO ABS: 1.4 10*3/uL — AB (ref 0.1–1.0)
Monocytes Relative: 9 %
Neutro Abs: 8.5 10*3/uL — ABNORMAL HIGH (ref 1.7–7.7)
Neutrophils Relative %: 53 %
Platelets: 394 10*3/uL (ref 150–400)
RBC: 4.83 MIL/uL (ref 3.87–5.11)
RDW: 13.5 % (ref 11.5–15.5)
WBC: 15.9 10*3/uL — ABNORMAL HIGH (ref 4.0–10.5)
nRBC: 0 % (ref 0.0–0.2)

## 2018-05-09 LAB — I-STAT TROPONIN, ED: Troponin i, poc: 0 ng/mL (ref 0.00–0.08)

## 2018-05-09 LAB — COMPREHENSIVE METABOLIC PANEL
ALT: 20 U/L (ref 0–44)
AST: 21 U/L (ref 15–41)
Albumin: 3.9 g/dL (ref 3.5–5.0)
Alkaline Phosphatase: 54 U/L (ref 38–126)
Anion gap: 9 (ref 5–15)
BUN: 15 mg/dL (ref 6–20)
CO2: 23 mmol/L (ref 22–32)
Calcium: 8.9 mg/dL (ref 8.9–10.3)
Chloride: 106 mmol/L (ref 98–111)
Creatinine, Ser: 0.87 mg/dL (ref 0.44–1.00)
GFR calc Af Amer: >60 mL/min (ref >60)
GFR calc non Af Amer: >60 mL/min (ref >60)
Glucose, Bld: 90 mg/dL (ref 70–99)
Potassium: 3.2 mmol/L — ABNORMAL LOW (ref 3.5–5.1)
Sodium: 138 mmol/L (ref 135–145)
Total Bilirubin: 0.5 mg/dL (ref 0.3–1.2)
Total Protein: 7.1 g/dL (ref 6.5–8.1)

## 2018-05-09 MED ORDER — IPRATROPIUM BROMIDE 0.02 % IN SOLN
RESPIRATORY_TRACT | Status: AC
  Filled 2018-05-09: qty 5

## 2018-05-09 MED ORDER — ALBUTEROL (5 MG/ML) CONTINUOUS INHALATION SOLN
15.0000 mg/h | INHALATION_SOLUTION | Freq: Once | RESPIRATORY_TRACT | Status: AC
  Administered 2018-05-09: 15 mg/h via RESPIRATORY_TRACT
  Filled 2018-05-09: qty 20

## 2018-05-09 MED ORDER — PREDNISONE 20 MG PO TABS: ORAL_TABLET | ORAL | 0 refills | Status: DC

## 2018-05-09 MED ORDER — METHYLPREDNISOLONE SODIUM SUCC 125 MG IJ SOLR
125.0000 mg | Freq: Once | INTRAMUSCULAR | Status: AC
  Administered 2018-05-09: 125 mg via INTRAVENOUS
  Filled 2018-05-09: qty 2

## 2018-05-09 MED ORDER — ALBUTEROL SULFATE (2.5 MG/3ML) 0.083% IN NEBU
5.0000 mg | INHALATION_SOLUTION | Freq: Once | RESPIRATORY_TRACT | Status: AC
  Administered 2018-05-09: 5 mg via RESPIRATORY_TRACT
  Filled 2018-05-09: qty 6

## 2018-05-09 MED ORDER — MAGNESIUM SULFATE 2 GM/50ML IV SOLN
2.0000 g | Freq: Once | INTRAVENOUS | Status: AC
  Administered 2018-05-09: 2 g via INTRAVENOUS
  Filled 2018-05-09: qty 50

## 2018-05-09 MED ORDER — IPRATROPIUM BROMIDE 0.02 % IN SOLN
1.0000 mg | Freq: Once | RESPIRATORY_TRACT | Status: AC
  Administered 2018-05-09: 1 mg via RESPIRATORY_TRACT
  Filled 2018-05-09: qty 5

## 2018-05-09 MED ORDER — SODIUM CHLORIDE 0.9 % IV BOLUS
1000.0000 mL | Freq: Once | INTRAVENOUS | Status: AC
  Administered 2018-05-09: 1000 mL via INTRAVENOUS

## 2018-05-09 MED ORDER — AZITHROMYCIN 250 MG PO TABS: ORAL_TABLET | ORAL | 0 refills | Status: DC

## 2018-05-09 NOTE — ED Provider Notes (Signed)
[facility] Provider Note   CSN: [id number] Arrival date & time: 05/09/18  1753     History   Chief Complaint Chief Complaint  Patient presents with  . Asthma    HPI Wendy Morgan is a 43 y.o. female history of asthma, here presenting with worsening shortness of breath, cough.  Patient states that she has some congestion and cough for the last 3 days. She then developed worsening trouble breathing and shortness of breath.  She has been using her albuterol inhalers at home with no relief.  She was noted to be tachypneic in triage and brought to the ED.  Denies any recent travel or history of blood clots.  The history is provided by the patient.    Past Medical History:  Diagnosis Date  . Asthma   . Bronchiectasis with acute exacerbation (HCC)     There are no active problems to display for this patient.   History reviewed. No pertinent surgical history.   OB History   No obstetric history on file.      Home Medications    Prior to Admission medications   Medication Sig Start Date End Date Taking? Authorizing Provider  albuterol (PROVENTIL HFA;VENTOLIN HFA) 108 (90 Base) MCG/ACT inhaler Inhale 1-2 puffs into the lungs every 4 (four) hours as needed for wheezing or shortness of breath. 04/23/18  Yes Domenick GongMortenson, Ashley, MD  albuterol (PROVENTIL) (5 MG/ML) 0.5% nebulizer solution Take 1 mL (5 mg total) by nebulization every 6 (six) hours as needed for wheezing or shortness of breath. 04/23/18  Yes Domenick GongMortenson, Ashley, MD  budesonide-formoterol [facility](SYMBICORT) 80-4.5 MCG/ACT inhaler Inhale 2 puffs into the lungs 2 (two) times daily. Patient not taking: Reported on 05/09/2018 04/23/18   Domenick GongMortenson, Ashley, MD  Cetirizine HCl 10 MG CAPS Take 1 capsule (10 mg total) by mouth daily. Patient not taking: Reported on 05/09/2018 02/12/18   Wieters, Hallie C, PA-C  dexamethasone (DECADRON) 4 MG tablet 4 tablets (16 mg) po at once tomorrow Patient not  taking: Reported on 05/09/2018 04/23/18   Domenick GongMortenson, Ashley, MD  montelukast (SINGULAIR) 10 MG tablet Take 1 tablet (10 mg total) by mouth at bedtime. 04/23/18   Domenick GongMortenson, Ashley, MD  Spacer/Aero-Holding Chambers (AEROCHAMBER PLUS) inhaler Use as instructed 04/23/18   Domenick GongMortenson, Ashley, MD    Family History History reviewed. No pertinent family history.  Social History Social History   Tobacco Use  . Smoking status: Former Smoker    Last attempt to quit: 1998    Years since quitting: 22.0  . Smokeless tobacco: Never Used  Substance Use Topics  . Alcohol use: No  . Drug use: No     Allergies   Patient has no known allergies.   Review of Systems Review of Systems  Respiratory: Positive for shortness of breath.   All other systems reviewed and are negative.    Physical Exam Updated Vital Signs BP 133/75   Pulse 90   Temp 98 F (36.7 C) (Oral)   Resp 10   LMP 04/14/2018 (Exact Date)   SpO2 100%   Physical Exam Vitals signs and nursing note reviewed.  HENT:     Head: Normocephalic.     Right Ear: Tympanic membrane normal.     Left Ear: Tympanic membrane normal.     Nose: Nose normal.     Mouth/Throat:     Mouth: Mucous membranes are moist.  Eyes:     Pupils: Pupils are equal, round, and reactive to  light.  Neck:     Musculoskeletal: Normal range of motion.  Cardiovascular:     Rate and Rhythm: Regular rhythm. Tachycardia present.  Pulmonary:     Comments: Tachypneic, mild retractions, diminished breath sounds  Abdominal:     General: Abdomen is flat.     Palpations: Abdomen is soft.  Musculoskeletal: Normal range of motion.  Skin:    General: Skin is warm.     Capillary Refill: Capillary refill takes less than 2 seconds.  Neurological:     General: No focal deficit present.     Mental Status: She is alert and oriented to person, place, and time.  Psychiatric:        Mood and Affect: Mood normal.        Behavior: Behavior normal.      ED Treatments /  Results  Labs (all labs ordered are listed, but only abnormal results are displayed) Labs Reviewed  CBC WITH DIFFERENTIAL/PLATELET - Abnormal; Notable for the following components:      Result Value   WBC 15.9 (*)    Neutro Abs 8.5 (*)    Lymphs Abs 5.0 (*)    Monocytes Absolute 1.4 (*)    Eosinophils Absolute 0.8 (*)    Abs Immature Granulocytes 0.11 (*)    All other components within normal limits  COMPREHENSIVE METABOLIC PANEL - Abnormal; Notable for the following components:   Potassium 3.2 (*)    All other components within normal limits  I-STAT TROPONIN, ED  POC URINE PREG, ED    EKG EKG Interpretation  Date/Time:  Thursday May 09 2018 18:50:26 EST Ventricular Rate:  95 PR Interval:    QRS Duration: 101 QT Interval:  355 QTC Calculation: 447 R Axis:   50 Text Interpretation:  Sinus rhythm Biatrial enlargement No significant change since last tracing Confirmed by Richardean CanalYao,  H ([id number]) on 05/09/2018 6:53:22 PM   Radiology Dg Chest Port 1 View  Result Date: 05/09/2018 CLINICAL DATA:  Acute shortness of breath for 3 days. EXAM: PORTABLE CHEST 1 VIEW COMPARISON:  03/27/2018 and prior exams FINDINGS: The cardiomediastinal silhouette is unremarkable. There is no evidence of focal airspace disease, pulmonary edema, suspicious pulmonary nodule/mass, pleural effusion, or pneumothorax. No acute bony abnormalities are identified. IMPRESSION: No active disease. Electronically Signed   By: Harmon PierJeffrey  Hu M.D.   On: 05/09/2018 19:53    Procedures Procedures (including critical care time)  Medications Ordered in ED Medications  ipratropium (ATROVENT) 0.02 % nebulizer solution (  Not Given 05/09/18 2046)  albuterol (PROVENTIL) (2.5 MG/3ML) 0.083% nebulizer solution 5 mg (5 mg Nebulization Given 05/09/18 1828)  albuterol (PROVENTIL,VENTOLIN) solution continuous neb (15 mg/hr Nebulization Given 05/09/18 1854)  ipratropium (ATROVENT) nebulizer solution 1 mg (1 mg Nebulization Given  05/09/18 2043)  methylPREDNISolone sodium succinate (SOLU-MEDROL) 125 mg/2 mL injection 125 mg (125 mg Intravenous Given 05/09/18 1854)  sodium chloride 0.9 % bolus 1,000 mL (1,000 mLs Intravenous New Bag/Given 05/09/18 2043)  magnesium sulfate IVPB 2 g 50 mL (2 g Intravenous New Bag/Given 05/09/18 2043)     Initial Impression / Assessment and Plan / ED Course  I have reviewed the triage vital signs and the nursing notes.  Pertinent labs & imaging results that were available during my care of the patient were reviewed by me and considered in my medical decision making (see chart for details).    Ania Brett FairyM Leibensperger is a 43 y.o. female here with SOB. Likely asthma exacerbation. Will get labs, CXR. Will give nebs,  steroids and reassess. I doubt PE.   9:46 PM WBC 16. CXR showed no pneumonia. Felt better after nebs. Ambulated well. More wheezing now. I think she has asthma exacerbation. Will give prednisone, zpack. Stable for discharge    Final Clinical Impressions(s) / ED Diagnoses   Final diagnoses:  None    ED Discharge Orders    None       Charlynne Pander, MD 05/09/18 2147

## 2018-05-09 NOTE — ED Notes (Signed)
Pt walks to bathroom on own ability. Maintains >94%. Steady on feet, reports breathing much easier at this time.

## 2018-05-09 NOTE — ED Notes (Signed)
Patient verbalizes understanding of discharge instructions. Opportunity for questioning and answers were provided. Armband removed by staff, pt discharged from ED ambulatory.   

## 2018-05-09 NOTE — ED Triage Notes (Signed)
Pt endorses asthma flare up x 2 days. Using inhaler and nebulizer at home without relief. Pt observed to be coughing with auditory wheezing.

## 2018-05-09 NOTE — Discharge Instructions (Addendum)
Take zpack as prescribed.   Use albuterol as needed for wheezing.   Take prednisone as prescribed.   See your doctor  Return to ER if you have worse shortness of breath, wheezing, trouble breathing

## 2018-05-14 ENCOUNTER — Encounter (HOSPITAL_COMMUNITY): Payer: Self-pay

## 2018-05-14 ENCOUNTER — Emergency Department (HOSPITAL_COMMUNITY)
Admission: EM | Admit: 2018-05-14 | Discharge: 2018-05-15 | Disposition: A | Discharge status: Home / Self Care | Payer: Medicaid Other | Attending: Emergency Medicine | Admitting: Emergency Medicine

## 2018-05-14 DIAGNOSIS — J4541 Moderate persistent asthma with (acute) exacerbation: Secondary | ICD-10-CM | POA: Diagnosis not present

## 2018-05-14 DIAGNOSIS — Z79899 Other long term (current) drug therapy: Secondary | ICD-10-CM | POA: Insufficient documentation

## 2018-05-14 DIAGNOSIS — R0602 Shortness of breath: Secondary | ICD-10-CM | POA: Diagnosis present

## 2018-05-14 DIAGNOSIS — Z87891 Personal history of nicotine dependence: Secondary | ICD-10-CM | POA: Insufficient documentation

## 2018-05-14 MED ORDER — ALBUTEROL SULFATE (2.5 MG/3ML) 0.083% IN NEBU
5.0000 mg | INHALATION_SOLUTION | Freq: Once | RESPIRATORY_TRACT | Status: AC
  Administered 2018-05-14: 5 mg via RESPIRATORY_TRACT
  Filled 2018-05-14: qty 6

## 2018-05-14 NOTE — ED Triage Notes (Signed)
Patient seen last week and prescribed antibiotic and steroids for asthma/resp infection. States that she still feels congestion and chest tightness, wheezing noted

## 2018-05-15 MED ORDER — MONTELUKAST SODIUM 10 MG PO TABS: 10.0000 mg | ORAL_TABLET | Freq: Every day | ORAL | 0 refills | Status: DC

## 2018-05-15 MED ORDER — MONTELUKAST SODIUM 10 MG PO TABS
10.0000 mg | ORAL_TABLET | Freq: Every day | ORAL | Status: DC
  Administered 2018-05-15: 10 mg via ORAL
  Filled 2018-05-15: qty 1

## 2018-05-15 MED ORDER — IPRATROPIUM BROMIDE 0.02 % IN SOLN
0.5000 mg | Freq: Once | RESPIRATORY_TRACT | Status: AC
  Administered 2018-05-15: 0.5 mg via RESPIRATORY_TRACT
  Filled 2018-05-15: qty 2.5

## 2018-05-15 MED ORDER — ALBUTEROL SULFATE (2.5 MG/3ML) 0.083% IN NEBU
5.0000 mg | INHALATION_SOLUTION | Freq: Once | RESPIRATORY_TRACT | Status: AC
  Administered 2018-05-15: 5 mg via RESPIRATORY_TRACT
  Filled 2018-05-15: qty 6

## 2018-05-15 MED ORDER — ALBUTEROL SULFATE HFA 108 (90 BASE) MCG/ACT IN AERS
2.0000 | INHALATION_SPRAY | Freq: Once | RESPIRATORY_TRACT | Status: AC
  Administered 2018-05-15: 2 via RESPIRATORY_TRACT
  Filled 2018-05-15: qty 6.7

## 2018-05-15 MED ORDER — DEXAMETHASONE 4 MG PO TABS
10.0000 mg | ORAL_TABLET | Freq: Once | ORAL | Status: AC
  Administered 2018-05-15: 10 mg via ORAL
  Filled 2018-05-15: qty 3

## 2018-05-15 MED ORDER — MOMETASONE FURO-FORMOTEROL FUM 100-5 MCG/ACT IN AERO
2.0000 | INHALATION_SPRAY | Freq: Two times a day (BID) | RESPIRATORY_TRACT | Status: DC
  Administered 2018-05-15: 2 via RESPIRATORY_TRACT
  Filled 2018-05-15: qty 8.8

## 2018-05-15 NOTE — Discharge Instructions (Addendum)
Use 2 puffs Dulera twice a day. Continue with 2 puffs of your albuterol inhaler every 4-6 hours as needed for wheezing and shortness of breath.  We recommend daily use of Singulair as well as Zyrtec or Claritin.  Follow-up with a primary care doctor as scheduled.

## 2018-05-15 NOTE — ED Notes (Signed)
Reviewed d/c instructions with pt, who verbalized understanding and had no outstanding questions. Pt departed in NAD, refused use of wheelchair.   

## 2018-05-15 NOTE — ED Provider Notes (Signed)
[facility] EMERGENCY DEPARTMENT Provider Note   CSN: [id number] Arrival date & time: 05/14/18  1636     History   Chief Complaint Chief Complaint  Patient presents with  . recheck asthma/URI    HPI Wendy Morgan is a 43 y.o. female.   43 year old female with a history of asthma and bronchiectasis with exacerbation presents to the ED for worsening shortness of breath and chest tightness.  She states that her symptoms have been persistent since last evaluation 6 days ago.  She has no insurance and has been trying to use her home albuterol for symptom control without relief.  She was also given a course of steroids and azithromycin which she completed with no improvement.  She has not had any fevers, chest pain, syncope.  Is slated to follow-up with the [facility] in February.  Reports that symptoms are consistent with prior asthma exacerbations.  The history is provided by the patient. No language interpreter was used.    Past Medical History:  Diagnosis Date  . Asthma   . Bronchiectasis with acute exacerbation (HCC)     There are no active problems to display for this patient.   History reviewed. No pertinent surgical history.   OB History   No obstetric history on file.      Home Medications    Prior to Admission medications   Medication Sig Start Date End Date Taking? Authorizing Provider  albuterol (PROVENTIL HFA;VENTOLIN HFA) 108 (90 Base) MCG/ACT inhaler Inhale 1-2 puffs into the lungs every 4 (four) hours as needed for wheezing or shortness of breath. 04/23/18   Domenick Gong, MD  albuterol (PROVENTIL) (5 MG/ML) 0.5% nebulizer solution Take 1 mL (5 mg total) by nebulization every 6 (six) hours as needed for wheezing or shortness of breath. 04/23/18   Domenick Gong, MD  azithromycin (ZITHROMAX Z-PAK) 250 MG tablet 2 po day one, then 1 daily x 4 days 05/09/18   Charlynne Pander, MD  budesonide-formoterol [facility]) 80-4.5  MCG/ACT inhaler Inhale 2 puffs into the lungs 2 (two) times daily. Patient not taking: Reported on 05/09/2018 04/23/18   Domenick Gong, MD  Cetirizine HCl 10 MG CAPS Take 1 capsule (10 mg total) by mouth daily. Patient not taking: Reported on 05/09/2018 02/12/18   Wieters, Hallie C, PA-C  dexamethasone (DECADRON) 4 MG tablet 4 tablets (16 mg) po at once tomorrow Patient not taking: Reported on 05/09/2018 04/23/18   Domenick Gong, MD  montelukast (SINGULAIR) 10 MG tablet Take 1 tablet (10 mg total) by mouth at bedtime. 05/15/18   Antony Madura, PA-C  predniSONE (DELTASONE) 20 MG tablet Take 60 mg daily x 2 days then 40 mg daily x 2 days then 20 mg daily x 2 days 05/09/18   Charlynne Pander, MD  Spacer/Aero-Holding Chambers (AEROCHAMBER PLUS) inhaler Use as instructed 04/23/18   Domenick Gong, MD    Family History No family history on file.  Social History Social History   Tobacco Use  . Smoking status: Former Smoker    Last attempt to quit: 1998    Years since quitting: 22.0  . Smokeless tobacco: Never Used  Substance Use Topics  . Alcohol use: No  . Drug use: No     Allergies   Patient has no known allergies.   Review of Systems Review of Systems Ten systems reviewed and are negative for acute change, except as noted in the HPI.    Physical Exam Updated Vital Signs BP 115/86  Pulse 85   Temp 97.8 F (36.6 C) (Oral)   Resp 16   SpO2 97%   Physical Exam Vitals signs and nursing note reviewed.  Constitutional:      General: She is not in acute distress.    Appearance: She is well-developed. She is not diaphoretic.     Comments: Nontoxic-appearing and in no distress  HENT:     Head: Normocephalic and atraumatic.  Eyes:     General: No scleral icterus.    Conjunctiva/sclera: Conjunctivae normal.  Neck:     Musculoskeletal: Normal range of motion.  Cardiovascular:     Rate and Rhythm: Normal rate and regular rhythm.     Pulses: Normal pulses.  Pulmonary:      Effort: Pulmonary effort is normal. No respiratory distress.     Breath sounds: No stridor. No wheezing or rales.     Comments: No respiratory distress.  Chest expansion symmetric.  There is diffuse expiratory wheezing in all lung fields.  No rales or rhonchi. Musculoskeletal: Normal range of motion.  Skin:    General: Skin is warm and dry.     Coloration: Skin is not pale.     Findings: No erythema or rash.  Neurological:     Mental Status: She is alert and oriented to person, place, and time.  Psychiatric:        Behavior: Behavior normal.      ED Treatments / Results  Labs (all labs ordered are listed, but only abnormal results are displayed) Labs Reviewed - No data to display  EKG None  Radiology No results found.  Procedures Procedures (including critical care time)  Medications Ordered in ED Medications  montelukast (SINGULAIR) tablet 10 mg (10 mg Oral Given 05/15/18 0206)  mometasone-formoterol (DULERA) 100-5 MCG/ACT inhaler 2 puff (2 puffs Inhalation Given 05/15/18 0207)  albuterol (PROVENTIL) (2.5 MG/3ML) 0.083% nebulizer solution 5 mg (5 mg Nebulization Given 05/14/18 1725)  albuterol (PROVENTIL) (2.5 MG/3ML) 0.083% nebulizer solution 5 mg (5 mg Nebulization Given 05/15/18 0035)  ipratropium (ATROVENT) nebulizer solution 0.5 mg (0.5 mg Nebulization Given 05/15/18 0035)  dexamethasone (DECADRON) tablet 10 mg (10 mg Oral Given 05/15/18 0034)  albuterol (PROVENTIL HFA;VENTOLIN HFA) 108 (90 Base) MCG/ACT inhaler 2 puff (2 puffs Inhalation Given 05/15/18 0210)    1:30 AM Patient reassessed.  Lung sounds improved.  Patient states that she is breathing much better.  She has no complaints of chest tightness or shortness of breath.  No hypoxia.   Initial Impression / Assessment and Plan / ED Course  I have reviewed the triage vital signs and the nursing notes.  Pertinent labs & imaging results that were available during my care of the patient were reviewed by me and  considered in my medical decision making (see chart for details).     43 year old female presents to the emergency department for evaluation of chest tightness and shortness of breath consistent with prior asthma exacerbations.  She was given a DuoNeb as well as an additional albuterol treatment with improvement in her respirations.  She has been seen frequently in an ED setting secondary to lack of insurance and primary care follow-up, though she does have a scheduled appointment with the [facility] in February.    I have concern about prescribing the patient an additional burst of oral steroids as she frequently is given this when presenting to the ED.  She was instead given Dulera at time of discharge as well as an albuterol inhaler to use as an  outpatient.  I provided a repeat prescription for Singulair to use daily.  Encouraged outpatient primary care follow-up as scheduled.  Return precautions discussed and provided. Patient discharged in stable condition with no unaddressed concerns.   Final Clinical Impressions(s) / ED Diagnoses   Final diagnoses:  Moderate persistent asthma with exacerbation    ED Discharge Orders         Ordered    montelukast (SINGULAIR) 10 MG tablet  Daily at bedtime     05/15/18 0127           Antony MaduraHumes, Dainel Arcidiacono, PA-C 05/15/18 [id number]    Palumbo, April, MD 05/15/18 0320

## 2018-05-28 ENCOUNTER — Observation Stay (HOSPITAL_COMMUNITY)
Admission: EM | Admit: 2018-05-28 | Discharge: 2018-05-30 | Disposition: A | Discharge status: Home / Self Care | Payer: Medicaid Other | Attending: Internal Medicine | Admitting: Internal Medicine

## 2018-05-28 ENCOUNTER — Encounter (HOSPITAL_COMMUNITY): Payer: Self-pay | Admitting: Emergency Medicine

## 2018-05-28 DIAGNOSIS — D72829 Elevated white blood cell count, unspecified: Secondary | ICD-10-CM | POA: Diagnosis not present

## 2018-05-28 DIAGNOSIS — J45901 Unspecified asthma with (acute) exacerbation: Secondary | ICD-10-CM | POA: Diagnosis present

## 2018-05-28 DIAGNOSIS — J4541 Moderate persistent asthma with (acute) exacerbation: Principal | ICD-10-CM | POA: Insufficient documentation

## 2018-05-28 DIAGNOSIS — Z87891 Personal history of nicotine dependence: Secondary | ICD-10-CM | POA: Diagnosis not present

## 2018-05-28 DIAGNOSIS — Z7951 Long term (current) use of inhaled steroids: Secondary | ICD-10-CM | POA: Diagnosis not present

## 2018-05-28 DIAGNOSIS — R0602 Shortness of breath: Secondary | ICD-10-CM | POA: Diagnosis present

## 2018-05-28 DIAGNOSIS — R062 Wheezing: Secondary | ICD-10-CM | POA: Diagnosis present

## 2018-05-28 DIAGNOSIS — Z79899 Other long term (current) drug therapy: Secondary | ICD-10-CM | POA: Insufficient documentation

## 2018-05-28 MED ORDER — ALBUTEROL SULFATE (2.5 MG/3ML) 0.083% IN NEBU
5.0000 mg | INHALATION_SOLUTION | Freq: Once | RESPIRATORY_TRACT | Status: AC
  Administered 2018-05-28: 5 mg via RESPIRATORY_TRACT
  Filled 2018-05-28: qty 6

## 2018-05-28 MED ORDER — IPRATROPIUM BROMIDE 0.02 % IN SOLN
0.5000 mg | Freq: Once | RESPIRATORY_TRACT | Status: AC
  Administered 2018-05-28: 0.5 mg via RESPIRATORY_TRACT
  Filled 2018-05-28: qty 2.5

## 2018-05-28 NOTE — ED Triage Notes (Signed)
Patient here with audible wheezing and shortness of breath. Patient has been using her inhalers with no relief.

## 2018-05-29 ENCOUNTER — Emergency Department (HOSPITAL_COMMUNITY): Payer: Medicaid Other

## 2018-05-29 ENCOUNTER — Other Ambulatory Visit: Payer: Self-pay

## 2018-05-29 DIAGNOSIS — J45901 Unspecified asthma with (acute) exacerbation: Secondary | ICD-10-CM

## 2018-05-29 LAB — CBC WITH DIFFERENTIAL/PLATELET
ABS IMMATURE GRANULOCYTES: 0.08 10*3/uL — AB (ref 0.00–0.07)
Basophils Absolute: 0.1 10*3/uL (ref 0.0–0.1)
Basophils Relative: 1 %
Eosinophils Absolute: 1.3 10*3/uL — ABNORMAL HIGH (ref 0.0–0.5)
Eosinophils Relative: 9 %
HCT: 44.9 % (ref 36.0–46.0)
Hemoglobin: 14.8 g/dL (ref 12.0–15.0)
Immature Granulocytes: 1 %
Lymphocytes Relative: 29 %
Lymphs Abs: 4 10*3/uL (ref 0.7–4.0)
MCH: 29.7 pg (ref 26.0–34.0)
MCHC: 33 g/dL (ref 30.0–36.0)
MCV: 90.2 fL (ref 80.0–100.0)
Monocytes Absolute: 1.3 10*3/uL — ABNORMAL HIGH (ref 0.1–1.0)
Monocytes Relative: 9 %
NEUTROS PCT: 51 %
Neutro Abs: 7 10*3/uL (ref 1.7–7.7)
PLATELETS: 282 10*3/uL (ref 150–400)
RBC: 4.98 MIL/uL (ref 3.87–5.11)
RDW: 13.5 % (ref 11.5–15.5)
WBC: 13.7 10*3/uL — ABNORMAL HIGH (ref 4.0–10.5)
nRBC: 0 % (ref 0.0–0.2)

## 2018-05-29 LAB — CREATININE, SERUM
Creatinine, Ser: 1.2 mg/dL — ABNORMAL HIGH (ref 0.44–1.00)
GFR calc Af Amer: >60 mL/min (ref >60)
GFR calc non Af Amer: 55 mL/min — ABNORMAL LOW (ref >60)

## 2018-05-29 LAB — CBC
HCT: 43.3 % (ref 36.0–46.0)
Hemoglobin: 14.4 g/dL (ref 12.0–15.0)
MCH: 29.8 pg (ref 26.0–34.0)
MCHC: 33.3 g/dL (ref 30.0–36.0)
MCV: 89.5 fL (ref 80.0–100.0)
Platelets: 293 10*3/uL (ref 150–400)
RBC: 4.84 MIL/uL (ref 3.87–5.11)
RDW: 13.6 % (ref 11.5–15.5)
WBC: 11.4 10*3/uL — ABNORMAL HIGH (ref 4.0–10.5)
nRBC: 0 % (ref 0.0–0.2)

## 2018-05-29 LAB — BASIC METABOLIC PANEL
ANION GAP: 12 (ref 5–15)
BUN: 11 mg/dL (ref 6–20)
CO2: 24 mmol/L (ref 22–32)
Calcium: 8.6 mg/dL — ABNORMAL LOW (ref 8.9–10.3)
Chloride: 104 mmol/L (ref 98–111)
Creatinine, Ser: 0.98 mg/dL (ref 0.44–1.00)
GFR calc Af Amer: >60 mL/min (ref >60)
GLUCOSE: 108 mg/dL — AB (ref 70–99)
Potassium: 3.5 mmol/L (ref 3.5–5.1)
Sodium: 140 mmol/L (ref 135–145)

## 2018-05-29 LAB — PREGNANCY, URINE: Preg Test, Ur: NEGATIVE

## 2018-05-29 MED ORDER — ALBUTEROL (5 MG/ML) CONTINUOUS INHALATION SOLN
10.0000 mg/h | INHALATION_SOLUTION | RESPIRATORY_TRACT | Status: DC
  Administered 2018-05-29: 10 mg/h via RESPIRATORY_TRACT

## 2018-05-29 MED ORDER — IPRATROPIUM-ALBUTEROL 0.5-2.5 (3) MG/3ML IN SOLN
3.0000 mL | Freq: Four times a day (QID) | RESPIRATORY_TRACT | Status: DC
  Administered 2018-05-29 – 2018-05-30 (×5): 3 mL via RESPIRATORY_TRACT
  Filled 2018-05-29 (×5): qty 3

## 2018-05-29 MED ORDER — METHYLPREDNISOLONE SODIUM SUCC 125 MG IJ SOLR
125.0000 mg | Freq: Once | INTRAMUSCULAR | Status: AC
  Administered 2018-05-29: 125 mg via INTRAVENOUS
  Filled 2018-05-29: qty 2

## 2018-05-29 MED ORDER — MAGNESIUM SULFATE 2 GM/50ML IV SOLN
2.0000 g | Freq: Once | INTRAVENOUS | Status: AC
  Administered 2018-05-29: 2 g via INTRAVENOUS
  Filled 2018-05-29: qty 50

## 2018-05-29 MED ORDER — ALBUTEROL SULFATE (2.5 MG/3ML) 0.083% IN NEBU: 2.5000 mg | INHALATION_SOLUTION | RESPIRATORY_TRACT | Status: DC | PRN

## 2018-05-29 MED ORDER — HEPARIN SODIUM (PORCINE) 5000 UNIT/ML IJ SOLN
5000.0000 [IU] | Freq: Three times a day (TID) | INTRAMUSCULAR | Status: DC
  Administered 2018-05-29 – 2018-05-30 (×4): 5000 [IU] via SUBCUTANEOUS
  Filled 2018-05-29 (×5): qty 1

## 2018-05-29 MED ORDER — METHYLPREDNISOLONE SODIUM SUCC 125 MG IJ SOLR
80.0000 mg | Freq: Three times a day (TID) | INTRAMUSCULAR | Status: DC
  Administered 2018-05-29 – 2018-05-30 (×3): 80 mg via INTRAVENOUS
  Filled 2018-05-29 (×4): qty 2

## 2018-05-29 MED ORDER — MONTELUKAST SODIUM 10 MG PO TABS
10.0000 mg | ORAL_TABLET | Freq: Every day | ORAL | Status: DC
  Administered 2018-05-29: 10 mg via ORAL
  Filled 2018-05-29 (×2): qty 1

## 2018-05-29 MED ORDER — PANTOPRAZOLE SODIUM 40 MG PO TBEC
40.0000 mg | DELAYED_RELEASE_TABLET | Freq: Every day | ORAL | Status: DC
  Administered 2018-05-29 – 2018-05-30 (×2): 40 mg via ORAL
  Filled 2018-05-29 (×2): qty 1

## 2018-05-29 MED ORDER — MOMETASONE FURO-FORMOTEROL FUM 100-5 MCG/ACT IN AERO
2.0000 | INHALATION_SPRAY | Freq: Two times a day (BID) | RESPIRATORY_TRACT | Status: DC
  Administered 2018-05-29: 2 via RESPIRATORY_TRACT
  Filled 2018-05-29 (×2): qty 8.8

## 2018-05-29 MED ORDER — ALBUTEROL (5 MG/ML) CONTINUOUS INHALATION SOLN
15.0000 mg/h | INHALATION_SOLUTION | Freq: Once | RESPIRATORY_TRACT | Status: AC
  Administered 2018-05-29: 15 mg/h via RESPIRATORY_TRACT
  Filled 2018-05-29: qty 20

## 2018-05-29 NOTE — ED Notes (Signed)
Got patient on the monitor patient is resting with call bell in reach  ?

## 2018-05-29 NOTE — H&P (Signed)
TRH H&P   Patient Demographics:    Wendy Morgan, is a 43 y.o. female  MRN: [id number]   DOB - [birth year]  Admit Date - 05/28/2018  Outpatient Primary MD for the patient is Patient, No Pcp Per  Referring MD/NP/PA: Sharilyn SitesSanders,lisa  Patient coming from: Home  Chief Complaint  Patient presents with  . Asthma      HPI:    Wendy CowperSherita Hardenbrook  is a 43 y.o. female, past medical history of asthma, never required inpatient hospitalization or intubation, asthma has been treated with Symbicort, and rescue inhalers, presents to ED with complaints of shortness of breath, for last 48 hours, she does report some cough, productive with clear phlegm, she reports she had recent URI, did use her inhalers at home without much relief, patient had multiple recent ED visits secondary to asthma(apparently 6 visits over the last 3924-month) -In ED she had significantly increased work of breathing, with diminished air entry, she remains significant wheezing despite receiving IV Solu-Medrol, and prolonged albuterol treatment, so I was called for admission    Review of systems:    In addition to the HPI above,  No Fever-chills, No Headache, No changes with Vision or hearing, No problems swallowing food or Liquids, No Chest pain, reports shortness of breath, cough, productive yellow phlegm, and significant wheezing  No Abdominal pain, No Nausea or Vommitting, Bowel movements are regular, No Blood in stool or Urine, No dysuria, No new skin rashes or bruises, No new joints pains-aches,  No new weakness, tingling, numbness in any extremity, No recent weight gain or loss, No polyuria, polydypsia or polyphagia, No significant Mental Stressors.  A full 10 point Review of Systems was done, except as stated above, all other Review of Systems were negative.   With Past History of the following :    Past Medical  History:  Diagnosis Date  . Asthma   . Bronchiectasis with acute exacerbation (HCC)       History reviewed. No pertinent surgical history.    Social History:     Social History   Tobacco Use  . Smoking status: Former Smoker    Last attempt to quit: 1998    Years since quitting: 22.1  . Smokeless tobacco: Never Used  Substance Use Topics  . Alcohol use: No     Lives -at home  Mobility -independent    Family History :   She denies any family history of asthma   Home Medications:   Prior to Admission medications   Medication Sig Start Date End Date Taking? Authorizing Provider  albuterol (PROVENTIL HFA;VENTOLIN HFA) 108 (90 Base) MCG/ACT inhaler Inhale 1-2 puffs into the lungs every 4 (four) hours as needed for wheezing or shortness of breath. 04/23/18  Yes Domenick GongMortenson, Ashley, MD  albuterol (PROVENTIL) (5 MG/ML) 0.5% nebulizer solution Take 1 mL (5 mg total) by nebulization every 6 (six) hours as needed  for wheezing or shortness of breath. 04/23/18  Yes Domenick Gong, MD  budesonide-formoterol [facility]) 80-4.5 MCG/ACT inhaler Inhale 2 puffs into the lungs 2 (two) times daily. 04/23/18  Yes Domenick Gong, MD  montelukast (SINGULAIR) 10 MG tablet Take 1 tablet (10 mg total) by mouth at bedtime. 05/15/18  Yes Antony Madura, PA-C  azithromycin (ZITHROMAX Z-PAK) 250 MG tablet 2 po day one, then 1 daily x 4 days Patient not taking: Reported on 05/29/2018 05/09/18   Charlynne Pander, MD  Cetirizine HCl 10 MG CAPS Take 1 capsule (10 mg total) by mouth daily. Patient not taking: Reported on 05/09/2018 02/12/18   Wieters, Hallie C, PA-C  dexamethasone (DECADRON) 4 MG tablet 4 tablets (16 mg) po at once tomorrow Patient not taking: Reported on 05/09/2018 04/23/18   Domenick Gong, MD  predniSONE (DELTASONE) 20 MG tablet Take 60 mg daily x 2 days then 40 mg daily x 2 days then 20 mg daily x 2 days Patient not taking: Reported on 05/29/2018 05/09/18   Charlynne Pander, MD    Spacer/Aero-Holding Chambers (AEROCHAMBER PLUS) inhaler Use as instructed 04/23/18   Domenick Gong, MD     Allergies:    No Known Allergies   Physical Exam:   Vitals  Blood pressure 132/85, pulse (!) 102, temperature 97.6 F (36.4 C), temperature source Oral, resp. rate 18, SpO2 96 %.   1. General developed female, laying in bed, mild distress secondary to increased work of breathing  2. Normal affect and insight, Not Suicidal or Homicidal, Awake Alert, Oriented X 3.  3. No F.N deficits, ALL C.Nerves Intact, Strength 5/5 all 4 extremities, Sensation intact all 4 extremities, Plantars down going.  4. Ears and Eyes appear Normal, Conjunctivae clear, PERRLA. Moist Oral Mucosa.  5. Supple Neck, No JVD, No cervical lymphadenopathy appriciated, No Carotid Bruits.  6. Symmetrical Chest wall movement, and with diminished air entry bilaterally, with diffuse wheezing bilaterally, with mild use of accessory muscle , able to speak full sentences.  7. RRR, No Gallops, Rubs or Murmurs, No Parasternal Heave.  8. Positive Bowel Sounds, Abdomen Soft, No tenderness, No organomegaly appriciated,No rebound -guarding or rigidity.  9.  No Cyanosis, Normal Skin Turgor, No Skin Rash or Bruise.  10. Good muscle tone,  joints appear normal , no effusions, Normal ROM.  11. No Palpable Lymph Nodes in Neck or Axillae    Data Review:    CBC Recent Labs  Lab 05/29/18 0330  WBC 13.7*  HGB 14.8  HCT 44.9  PLT 282  MCV 90.2  MCH 29.7  MCHC 33.0  RDW 13.5  LYMPHSABS 4.0  MONOABS 1.3*  EOSABS 1.3*  BASOSABS 0.1   ------------------------------------------------------------------------------------------------------------------  Chemistries  Recent Labs  Lab 05/29/18 0330  NA 140  K 3.5  CL 104  CO2 24  GLUCOSE 108*  BUN 11  CREATININE 0.98  CALCIUM 8.6*   ------------------------------------------------------------------------------------------------------------------ CrCl  cannot be calculated (Unknown ideal weight.). ------------------------------------------------------------------------------------------------------------------ No results for input(s): TSH, T4TOTAL, T3FREE, THYROIDAB in the last 72 hours.  Invalid input(s): FREET3  Coagulation profile No results for input(s): INR, PROTIME in the last 168 hours. ------------------------------------------------------------------------------------------------------------------- No results for input(s): DDIMER in the last 72 hours. -------------------------------------------------------------------------------------------------------------------  Cardiac Enzymes No results for input(s): CKMB, TROPONINI, MYOGLOBIN in the last 168 hours.  Invalid input(s): CK ------------------------------------------------------------------------------------------------------------------ No results found for: BNP   ---------------------------------------------------------------------------------------------------------------  Urinalysis No results found for: COLORURINE, APPEARANCEUR, LABSPEC, PHURINE, GLUCOSEU, HGBUR, BILIRUBINUR, KETONESUR, PROTEINUR, UROBILINOGEN, NITRITE, LEUKOCYTESUR  ----------------------------------------------------------------------------------------------------------------   Imaging  Results:    Dg Chest Port 1 View  Result Date: 05/29/2018 CLINICAL DATA:  Shortness of breath and wheezing EXAM: PORTABLE CHEST 1 VIEW COMPARISON:  05/09/2018 FINDINGS: The heart size and mediastinal contours are within normal limits. Both lungs are clear. The visualized skeletal structures are unremarkable. IMPRESSION: No active disease. Electronically Signed   By: Alcide Clever M.D.   On: 05/29/2018 03:51    My personal review of EKG: Rhythm NSR, Rate  93 /min, QTc 462 , no Acute ST changes   Assessment & Plan:    Active Problems:   Asthma exacerbation   Acute asthma exacerbation -Patient appears to be  with baseline assistant asthma, multiple recent ED visit for asthma exacerbation, she is with diminished air entry, diffuse wheezing, she will be admitted, will be kept on IV Solu-Medrol 80 mg every 8 hours, will be kept on scheduled duo nebs, PRN albuterol, continue with home dose Symbicort, starting antibiotics. - will start Singulair -Currently she does not appear to be hypoxic, will monitor closely  Leukocytosis -Likely related to infection, this is most likely in the setting of recent outpatient oral steroid   DVT Prophylaxis Lovenox - SCDs   AM Labs Ordered, also please review Full Orders  Family Communication: Admission, patients condition and plan of care including tests being ordered have been discussed with the patient  who indicate understanding and agree with the plan and Code Status.  Code Status Full  Likely DC to  Home  Condition GUARDED    Consults called: none  Admission status: Observation  Time spent in minutes : 55 minutes   Huey Bienenstock M.D on 05/29/2018 at 10:40 AM  Between 7am to 7pm - Pager - [phone]. After 7pm go to [email or web address] - password [facility]  [facility] - Office  [phone]

## 2018-05-29 NOTE — ED Provider Notes (Signed)
[facility] EMERGENCY DEPARTMENT Provider Note   CSN: [id number] Arrival date & time: 05/28/18  2252     History   Chief Complaint Chief Complaint  Patient presents with  . Asthma    HPI Wendy Morgan is a 43 y.o. female.  The history is provided by the patient and medical records.  Asthma  Associated symptoms include shortness of breath.     43 year old female with history of asthma, presenting to the ED with shortness of breath.  States she has been having issues for 2 days now.  Does have cough with production of clear mucus, has had recent URI.  She denies any fevers.  No chest pain.  Has been using her inhalers at home without relief.    Of note, patient with several prior ED visits for asthma related concerns.  States has always been difficult to manage.  No prior admissions or intubations.    Past Medical History:  Diagnosis Date  . Asthma   . Bronchiectasis with acute exacerbation (HCC)     There are no active problems to display for this patient.   History reviewed. No pertinent surgical history.   OB History   No obstetric history on file.      Home Medications    Prior to Admission medications   Medication Sig Start Date End Date Taking? Authorizing Provider  albuterol (PROVENTIL HFA;VENTOLIN HFA) 108 (90 Base) MCG/ACT inhaler Inhale 1-2 puffs into the lungs every 4 (four) hours as needed for wheezing or shortness of breath. 04/23/18   Domenick Gong, MD  albuterol (PROVENTIL) (5 MG/ML) 0.5% nebulizer solution Take 1 mL (5 mg total) by nebulization every 6 (six) hours as needed for wheezing or shortness of breath. 04/23/18   Domenick Gong, MD  azithromycin (ZITHROMAX Z-PAK) 250 MG tablet 2 po day one, then 1 daily x 4 days 05/09/18   Charlynne Pander, MD  budesonide-formoterol [facility]) 80-4.5 MCG/ACT inhaler Inhale 2 puffs into the lungs 2 (two) times daily. Patient not taking: Reported on 05/09/2018 04/23/18   Domenick Gong, MD  Cetirizine HCl 10 MG CAPS Take 1 capsule (10 mg total) by mouth daily. Patient not taking: Reported on 05/09/2018 02/12/18   Wieters, Hallie C, PA-C  dexamethasone (DECADRON) 4 MG tablet 4 tablets (16 mg) po at once tomorrow Patient not taking: Reported on 05/09/2018 04/23/18   Domenick Gong, MD  montelukast (SINGULAIR) 10 MG tablet Take 1 tablet (10 mg total) by mouth at bedtime. 05/15/18   Antony Madura, PA-C  predniSONE (DELTASONE) 20 MG tablet Take 60 mg daily x 2 days then 40 mg daily x 2 days then 20 mg daily x 2 days 05/09/18   Charlynne Pander, MD  Spacer/Aero-Holding Chambers (AEROCHAMBER PLUS) inhaler Use as instructed 04/23/18   Domenick Gong, MD    Family History No family history on file.  Social History Social History   Tobacco Use  . Smoking status: Former Smoker    Last attempt to quit: 1998    Years since quitting: 22.1  . Smokeless tobacco: Never Used  Substance Use Topics  . Alcohol use: No  . Drug use: No     Allergies   Patient has no known allergies.   Review of Systems Review of Systems  Respiratory: Positive for cough, shortness of breath and wheezing.   All other systems reviewed and are negative.    Physical Exam Updated Vital Signs BP 118/76 (BP Location: Right Arm)   Pulse 78  Resp 16   SpO2 97%   Physical Exam Vitals signs and nursing note reviewed.  Constitutional:      Appearance: She is well-developed.  HENT:     Head: Normocephalic and atraumatic.  Eyes:     Conjunctiva/sclera: Conjunctivae normal.     Pupils: Pupils are equal, round, and reactive to light.  Neck:     Musculoskeletal: Normal range of motion.  Cardiovascular:     Rate and Rhythm: Normal rate and regular rhythm.     Heart sounds: Normal heart sounds.  Pulmonary:     Effort: Pulmonary effort is normal.     Breath sounds: Wheezing present.     Comments: Increased work of breathing with diffuse inspiratory and expiratory wheezes throughout, able  to speak in shortened sentences Abdominal:     General: Bowel sounds are normal.     Palpations: Abdomen is soft.  Musculoskeletal: Normal range of motion.  Skin:    General: Skin is warm and dry.  Neurological:     Mental Status: She is alert and oriented to person, place, and time.      ED Treatments / Results  Labs (all labs ordered are listed, but only abnormal results are displayed) Labs Reviewed  CBC WITH DIFFERENTIAL/PLATELET - Abnormal; Notable for the following components:      Result Value   WBC 13.7 (*)    Monocytes Absolute 1.3 (*)    Eosinophils Absolute 1.3 (*)    Abs Immature Granulocytes 0.08 (*)    All other components within normal limits  BASIC METABOLIC PANEL - Abnormal; Notable for the following components:   Glucose, Bld 108 (*)    Calcium 8.6 (*)    All other components within normal limits    EKG EKG Interpretation  Date/Time:  Tuesday May 28 2018 23:01:13 EST Ventricular Rate:  93 PR Interval:  148 QRS Duration: 80 QT Interval:  372 QTC Calculation: 462 R Axis:   43 Text Interpretation:  Normal sinus rhythm with sinus arrhythmia Possible Left atrial enlargement Low voltage QRS Borderline ECG When compared with ECG of 05/09/2018, No significant change was found Confirmed by Dione BoozeGlick, David ([id number]) on 05/28/2018 11:45:13 PM   Radiology Dg Chest Port 1 View  Result Date: 05/29/2018 CLINICAL DATA:  Shortness of breath and wheezing EXAM: PORTABLE CHEST 1 VIEW COMPARISON:  05/09/2018 FINDINGS: The heart size and mediastinal contours are within normal limits. Both lungs are clear. The visualized skeletal structures are unremarkable. IMPRESSION: No active disease. Electronically Signed   By: Alcide CleverMark  Lukens M.D.   On: 05/29/2018 03:51    Procedures Procedures (including critical care time)  CRITICAL CARE Performed by: Garlon HatchetLisa M Dwayne Begay   Total critical care time: 45 minutes  Critical care time was exclusive of separately billable procedures and  treating other patients.  Critical care was necessary to treat or prevent imminent or life-threatening deterioration.  Critical care was time spent personally by me on the following activities: development of treatment plan with patient and/or surrogate as well as nursing, discussions with consultants, evaluation of patient's response to treatment, examination of patient, obtaining history from patient or surrogate, ordering and performing treatments and interventions, ordering and review of laboratory studies, ordering and review of radiographic studies, pulse oximetry and re-evaluation of patient's condition.   Medications Ordered in ED Medications  albuterol (PROVENTIL,VENTOLIN) solution continuous neb (0 mg/hr Nebulization Stopped 05/29/18 0531)  albuterol (PROVENTIL) (2.5 MG/3ML) 0.083% nebulizer solution 5 mg (5 mg Nebulization Given 05/28/18 2312)  ipratropium (ATROVENT)  nebulizer solution 0.5 mg (0.5 mg Nebulization Given 05/28/18 2312)  albuterol (PROVENTIL,VENTOLIN) solution continuous neb (15 mg/hr Nebulization Given 05/29/18 0147)  methylPREDNISolone sodium succinate (SOLU-MEDROL) 125 mg/2 mL injection 125 mg (125 mg Intravenous Given 05/29/18 0350)  magnesium sulfate IVPB 2 g 50 mL (0 g Intravenous Stopped 05/29/18 0450)     Initial Impression / Assessment and Plan / ED Course  I have reviewed the triage vital signs and the nursing notes.  Pertinent labs & imaging results that were available during my care of the patient were reviewed by me and considered in my medical decision making (see chart for details).  43 y.o. F here with SOB.  Has been having issues for 2 days now.  Has wet cough with clear mucous, no fevers.  Recent URI.  She is afebrile, non-toxic.  By time of my evaluation she has already had duoneb as well as hour long CAT without much improvement.  At this point, will give IV solu-medrol, magnesium and obtain basic labs, CXR.  Repeat CAT.  Will monitor closely.  5:18  AM Patient has completed second CAT, minimal improvement.  Labs reassuring.  CXR clear.  She is not acutely hypoxic but still with significant wheezing and increased WOB.  We have discussed risk vs benefits of admission (she has no prior admission for asthma) and she elected to stay, reports this is the worst she ever remembers her asthma being.    Discussed with Dr. Toniann Fail-- he will admit for ongoing care.  Final Clinical Impressions(s) / ED Diagnoses   Final diagnoses:  Moderate persistent asthma with exacerbation    ED Discharge Orders    None       Garlon Hatchet, PA-C 05/29/18 0600    Dione Booze, MD 05/29/18 [phone]

## 2018-05-29 NOTE — ED Notes (Signed)
Ordered diet tray for pt  

## 2018-05-30 DIAGNOSIS — J4541 Moderate persistent asthma with (acute) exacerbation: Secondary | ICD-10-CM

## 2018-05-30 LAB — HIV ANTIBODY (ROUTINE TESTING W REFLEX): HIV SCREEN 4TH GENERATION: NONREACTIVE

## 2018-05-30 MED ORDER — PREDNISONE 50 MG PO TABS: 50.0000 mg | ORAL_TABLET | Freq: Every day | ORAL | 0 refills | Status: DC

## 2018-05-30 MED ORDER — PREDNISONE 50 MG PO TABS: 50.0000 mg | ORAL_TABLET | Freq: Every day | ORAL | 0 refills | Status: AC

## 2018-05-30 MED FILL — predniSONE 50 MG TABS: 50 | 5 days supply | Qty: 5 | Fill #0

## 2018-05-30 NOTE — Care Management Note (Signed)
Case Management Note  Patient Details  Name: Wendy JulianSherita M Morgan MRN: [id number] Date of Birth: 06-27-1975  Subjective/Objective:   From home, for dc today, [facility].  TOC will be filling her script for meds to day,  NCM did fill out Match letter for her but she will not have to use it because she only has one medication which is 4.00.                   Action/Plan: DC home when ready.   Expected Discharge Date:  05/30/18               Expected Discharge Plan:  Home/Self Care  In-House Referral:     Discharge planning Services  CM Consult, Follow-up appt scheduled, Medication Assistance, Indigent [facility]  Post Acute Care Choice:    Choice offered to:     DME Arranged:    DME Agency:     HH Arranged:    HH Agency:     Status of Service:  Completed, signed off  If discussed at [employer] Meetings, dates discussed:    Additional Comments:  Leone Havenaylor, Vaughn Beaumier Clinton, RN 05/30/2018, 10:29 AM

## 2018-05-30 NOTE — Discharge Summary (Addendum)
Physician Discharge Summary  Wendy Morgan:[id number] DOB: [birth year] DOA: 05/28/2018  PCP: Patient, No Pcp Per  Admit date: 05/28/2018 Discharge date: 05/30/2018  Admitted From: Home Disposition:  Home  Recommendations for Outpatient Follow-up:  1. Follow up with PCP in 1 week. Encouraged patient to follow-up and establish with a primary care physician to prevent her from seeking care at urgent care and in the emergency department.  She has been evaluated approximately 29 times in the past year with asthma exacerbation.  Discharge Condition: Stable, improved CODE STATUS: Full code Diet recommendation: Regular  Brief/Interim Summary: From H&P by Dr. Randol Kern: Wendy Morgan is a 43 y.o. female, past medical history of asthma, never required inpatient hospitalization or intubation, asthma has been treated with Symbicort, and rescue inhalers, presents to ED with complaints of shortness of breath, for last 48 hours, she does report some cough, productive with clear phlegm, she reports she had recent URI, did use her inhalers at home without much relief, patient had multiple recent ED visits secondary to asthma. In ED she had significantly increased work of breathing, with diminished air entry, she remains significant wheezing despite receiving IV Solu-Medrol, and prolonged albuterol treatment.   Overnight, she continued to improve.  Day of discharge, she was on room air without significant wheezing or conversational dyspnea.  She admitted that she felt her breathing was back to her baseline and ready for discharge home.  We discussed the importance of PCP establishment and follow-up to prevent her from seeking care in the emergency department so frequently.  Discharge Diagnoses:  Asthma exacerbation Leukocytosis  Discharge Instructions  Discharge Instructions    Call MD for:  difficulty breathing, headache or visual disturbances   Complete by:  As directed    Call MD for:  extreme  fatigue   Complete by:  As directed    Call MD for:  hives   Complete by:  As directed    Call MD for:  persistant dizziness or light-headedness   Complete by:  As directed    Call MD for:  persistant nausea and vomiting   Complete by:  As directed    Call MD for:  severe uncontrolled pain   Complete by:  As directed    Call MD for:  temperature >100.4   Complete by:  As directed    Diet general   Complete by:  As directed    Discharge instructions   Complete by:  As directed    You were cared for by a hospitalist during your hospital stay. If you have any questions about your discharge medications or the care you received while you were in the hospital after you are discharged, you can call the unit and ask to speak with the hospitalist on call if the hospitalist that took care of you is not available. Once you are discharged, your primary care physician will handle any further medical issues. Please note that NO REFILLS for any discharge medications will be authorized once you are discharged, as it is imperative that you return to your primary care physician (or establish a relationship with a primary care physician if you do not have one) for your aftercare needs so that they can reassess your need for medications and monitor your lab values.   Increase activity slowly   Complete by:  As directed      Allergies as of 05/30/2018   No Known Allergies     Medication List    TAKE these medications  albuterol (5 MG/ML) 0.5% nebulizer solution Commonly known as:  PROVENTIL Take 1 mL (5 mg total) by nebulization every 6 (six) hours as needed for wheezing or shortness of breath.   albuterol 108 (90 Base) MCG/ACT inhaler Commonly known as:  PROVENTIL HFA;VENTOLIN HFA Inhale 1-2 puffs into the lungs every 4 (four) hours as needed for wheezing or shortness of breath.   budesonide-formoterol 80-4.5 MCG/ACT inhaler Commonly known as:  SYMBICORT Inhale 2 puffs into the lungs 2 (two) times  daily.   montelukast 10 MG tablet Commonly known as:  SINGULAIR Take 1 tablet (10 mg total) by mouth at bedtime.   predniSONE 50 MG tablet Commonly known as:  DELTASONE Take 1 tablet (50 mg total) by mouth daily with breakfast for 5 days.      Follow-up Information    [facility] Follow up on 06/26/2018.   Why:  2:30 for hospital follow up Contact information: [address] [place] [phone]         No Known Allergies  Consultations:  None    Procedures/Studies: Dg Chest Port 1 View  Result Date: 05/29/2018 CLINICAL DATA:  Shortness of breath and wheezing EXAM: PORTABLE CHEST 1 VIEW COMPARISON:  05/09/2018 FINDINGS: The heart size and mediastinal contours are within normal limits. Both lungs are clear. The visualized skeletal structures are unremarkable. IMPRESSION: No active disease. Electronically Signed   By: Alcide CleverMark  Lukens M.D.   On: 05/29/2018 03:51   Dg Chest Port 1 View  Result Date: 05/09/2018 CLINICAL DATA:  Acute shortness of breath for 3 days. EXAM: PORTABLE CHEST 1 VIEW COMPARISON:  03/27/2018 and prior exams FINDINGS: The cardiomediastinal silhouette is unremarkable. There is no evidence of focal airspace disease, pulmonary edema, suspicious pulmonary nodule/mass, pleural effusion, or pneumothorax. No acute bony abnormalities are identified. IMPRESSION: No active disease. Electronically Signed   By: Harmon PierJeffrey  Hu M.D.   On: 05/09/2018 19:53       Discharge Exam: Vitals:   05/30/18 0730 05/30/18 0753  BP: 114/74   Pulse: 60 72  Resp:  20  Temp: 98.7 F (37.1 C)   SpO2: 98% 95%    General: Pt is alert, awake, not in acute distress Cardiovascular: RRR, S1/S2 +, no rubs, no gallops Respiratory: CTA bilaterally, no wheezing, no rhonchi, no distress, no conversational dyspnea on room air  Abdominal: Soft, NT, ND, bowel sounds + Extremities: no edema, no cyanosis    The results of  significant diagnostics from this hospitalization (including imaging, microbiology, ancillary and laboratory) are listed below for reference.     Microbiology: No results found for this or any previous visit (from the past 240 hour(s)).   Labs: BNP (last 3 results) No results for input(s): BNP in the last 8760 hours. Basic Metabolic Panel: Recent Labs  Lab 05/29/18 0330 05/29/18 1052  NA 140  --   K 3.5  --   CL 104  --   CO2 24  --   GLUCOSE 108*  --   BUN 11  --   CREATININE 0.98 1.20*  CALCIUM 8.6*  --    Liver Function Tests: No results for input(s): AST, ALT, ALKPHOS, BILITOT, PROT, ALBUMIN in the last 168 hours. No results for input(s): LIPASE, AMYLASE in the last 168 hours. No results for input(s): AMMONIA in the last 168 hours. CBC: Recent Labs  Lab 05/29/18 0330 05/29/18 1052  WBC 13.7* 11.4*  NEUTROABS 7.0  --   HGB 14.8 14.4  HCT 44.9 43.3  MCV 90.2 89.5  PLT 282 293   Cardiac Enzymes: No results for input(s): CKTOTAL, CKMB, CKMBINDEX, TROPONINI in the last 168 hours. BNP: Invalid input(s): POCBNP CBG: No results for input(s): GLUCAP in the last 168 hours. D-Dimer No results for input(s): DDIMER in the last 72 hours. Hgb A1c No results for input(s): HGBA1C in the last 72 hours. Lipid Profile No results for input(s): CHOL, HDL, LDLCALC, TRIG, CHOLHDL, LDLDIRECT in the last 72 hours. Thyroid function studies No results for input(s): TSH, T4TOTAL, T3FREE, THYROIDAB in the last 72 hours.  Invalid input(s): FREET3 Anemia work up No results for input(s): VITAMINB12, FOLATE, FERRITIN, TIBC, IRON, RETICCTPCT in the last 72 hours. Urinalysis No results found for: COLORURINE, APPEARANCEUR, LABSPEC, PHURINE, GLUCOSEU, HGBUR, BILIRUBINUR, KETONESUR, PROTEINUR, UROBILINOGEN, NITRITE, LEUKOCYTESUR Sepsis Labs Invalid input(s): PROCALCITONIN,  WBC,  LACTICIDVEN Microbiology No results found for this or any previous visit (from the past 240  hour(s)).   Patient was seen and examined on the day of discharge and was found to be in stable condition. Time coordinating discharge: 25 minutes including assessment and coordination of care, as well as examination of the patient.   SIGNED:  Noralee Stain, DO [facility] [email or web address] 05/30/2018, 10:23 AM

## 2018-06-26 ENCOUNTER — Other Ambulatory Visit: Payer: Self-pay

## 2018-06-26 ENCOUNTER — Encounter: Payer: Self-pay | Admitting: Nurse Practitioner

## 2018-06-26 ENCOUNTER — Ambulatory Visit: Payer: Self-pay | Attending: Nurse Practitioner | Admitting: Nurse Practitioner

## 2018-06-26 VITALS — BP 133/80 | HR 77 | Temp 98.1°F | Ht 64.0 in | Wt 208.2 lb

## 2018-06-26 DIAGNOSIS — J45998 Other asthma: Secondary | ICD-10-CM

## 2018-06-26 DIAGNOSIS — D72829 Elevated white blood cell count, unspecified: Secondary | ICD-10-CM

## 2018-06-26 MED ORDER — CETIRIZINE HCL 10 MG PO TABS: 10.0000 mg | ORAL_TABLET | Freq: Every day | ORAL | 11 refills | Status: DC

## 2018-06-26 MED ORDER — ALBUTEROL SULFATE HFA 108 (90 BASE) MCG/ACT IN AERS: 1.0000 | INHALATION_SPRAY | RESPIRATORY_TRACT | 1 refills | Status: DC | PRN

## 2018-06-26 MED ORDER — BUDESONIDE-FORMOTEROL FUMARATE 80-4.5 MCG/ACT IN AERO: 2.0000 | INHALATION_SPRAY | Freq: Two times a day (BID) | RESPIRATORY_TRACT | 6 refills | Status: DC

## 2018-06-26 MED ORDER — MONTELUKAST SODIUM 10 MG PO TABS: 10.0000 mg | ORAL_TABLET | Freq: Every day | ORAL | 1 refills | Status: DC

## 2018-06-26 NOTE — Patient Instructions (Signed)
Asthma, Adult    Asthma is a long-term (chronic) condition in which the airways get tight and narrow. The airways are the breathing passages that lead from the nose and mouth down into the lungs. A person with asthma will have times when symptoms get worse. These are called asthma attacks. They can cause coughing, whistling sounds when you breathe (wheezing), shortness of breath, and chest pain. They can make it hard to breathe. There is no cure for asthma, but medicines and lifestyle changes can help control it.  There are many things that can bring on an asthma attack or make asthma symptoms worse (triggers). Common triggers include:  · Mold.  · Dust.  · Cigarette smoke.  · Cockroaches.  · Things that can cause allergy symptoms (allergens). These include animal skin flakes (dander) and pollen from trees or grass.  · Things that pollute the air. These may include household cleaners, wood smoke, smog, or chemical odors.  · Cold air, weather changes, and wind.  · Crying or laughing hard.  · Stress.  · Certain medicines or drugs.  · Certain foods such as dried fruit, potato chips, and grape juice.  · Infections, such as a cold or the flu.  · Certain medical conditions or diseases.  · Exercise or tiring activities.  Asthma may be treated with medicines and by staying away from the things that cause asthma attacks. Types of medicines may include:  · Controller medicines. These help prevent asthma symptoms. They are usually taken every day.  · Fast-acting reliever or rescue medicines. These quickly relieve asthma symptoms. They are used as needed and provide short-term relief.  · Allergy medicines if your attacks are brought on by allergens.  · Medicines to help control the body's defense (immune) system.  Follow these instructions at home:  Avoiding triggers in your home  · Change your heating and air conditioning filter often.  · Limit your use of fireplaces and wood stoves.  · Get rid of pests (such as roaches and  mice) and their droppings.  · Throw away plants if you see mold on them.  · Clean your floors. Dust regularly. Use cleaning products that do not smell.  · Have someone vacuum when you are not home. Use a vacuum cleaner with a HEPA filter if possible.  · Replace carpet with wood, tile, or vinyl flooring. Carpet can trap animal skin flakes and dust.  · Use allergy-proof pillows, mattress covers, and box spring covers.  · Wash bed sheets and blankets every week in hot water. Dry them in a dryer.  · Keep your bedroom free of any triggers.   · Avoid pets and keep windows closed when things that cause allergy symptoms are in the air.  · Use blankets that are made of polyester or cotton.  · Clean bathrooms and kitchens with bleach. If possible, have someone repaint the walls in these rooms with mold-resistant paint. Keep out of the rooms that are being cleaned and painted.  · Wash your hands often with soap and water. If soap and water are not available, use hand sanitizer.  · Do not allow anyone to smoke in your home.  General instructions  · Take over-the-counter and prescription medicines only as told by your doctor.  ? Talk with your doctor if you have questions about how or when to take your medicines.  ? Make note if you need to use your medicines more often than usual.  · Do not use any products that   when doing outdoor activities in the winter. The mask should cover your nose and mouth. Exercise indoors on cold days if you can.  Warm up before you exercise. Take time to cool down after exercise.  Use a peak flow meter as told by your doctor. A peak flow meter is a tool that measures how well the lungs are working.  Keep track of the peak flow meter's readings.  Write them down.  Follow your asthma action plan. This is a written plan for taking care of your asthma and treating your attacks.  Make sure you get all the shots (vaccines) that your doctor recommends. Ask your doctor about a flu shot and a pneumonia shot.  Keep all follow-up visits as told by your doctor. This is important. Contact a doctor if:  You have wheezing, shortness of breath, or a cough even while taking medicine to prevent attacks.  The mucus you cough up (sputum) is thicker than usual.  The mucus you cough up changes from clear or white to yellow, green, gray, or bloody.  You have problems from the medicine you are taking, such as: ? A rash. ? Itching. ? Swelling. ? Trouble breathing.  You need reliever medicines more than 2-3 times a week.  Your peak flow reading is still at 50-79% of your personal best after following the action plan for 1 hour.  You have a fever. Get help right away if:  You seem to be worse and are not responding to medicine during an asthma attack.  You are short of breath even at rest.  You get short of breath when doing very little activity.  You have trouble eating, drinking, or talking.  You have chest pain or tightness.  You have a fast heartbeat.  Your lips or fingernails start to turn blue.  You are light-headed or dizzy, or you faint.  Your peak flow is less than 50% of your personal best.  You feel too tired to breathe normally. Summary  Asthma is a long-term (chronic) condition in which the airways get tight and narrow. An asthma attack can make it hard to breathe.  Asthma cannot be cured, but medicines and lifestyle changes can help control it.  Make sure you understand how to avoid triggers and how and when to use your medicines. This information is not intended to replace advice given to you by your health care provider. Make sure you discuss any questions you have with your health care provider. Document  Released: 09/20/2007 Document Revised: 05/08/2016 Document Reviewed: 05/08/2016 Elsevier Interactive Patient Education  2019 [employer]  Asthma Attack Prevention, Adult Although you may not be able to control the fact that you have asthma, you can take actions to prevent episodes of asthma (asthma attacks). These actions include:  Creating a written plan for managing and treating your asthma attacks (asthma action plan).  Monitoring your asthma.  Avoiding things that can irritate your airways or make your asthma symptoms worse (asthma triggers).  Taking your medicines as directed.  Acting quickly if you have signs or symptoms of an asthma attack. What are some ways to prevent an asthma attack? Create a plan Work with your health care provider to create an asthma action plan. This plan should include:  A list of your asthma triggers and how to avoid them.  A list of symptoms that you experience during an asthma attack.  Information about when to take medicine and how much medicine to take.  Information to help  you understand your peak flow measurements.  Contact information for your health care providers.  Daily actions that you can take to control asthma. Monitor your asthma To monitor your asthma:  Use your peak flow meter every morning and every evening for 2-3 weeks. Record the results in a journal. A drop in your peak flow numbers on one or more days may mean that you are starting to have an asthma attack, even if you are not having symptoms.  When you have asthma symptoms, write them down in a journal.  Avoid asthma triggers Work with your health care provider to find out what your asthma triggers are. This can be done by:  Being tested for allergies.  Keeping a journal that notes when asthma attacks occur and what may have contributed to them.  Asking your health care provider whether other medical conditions make your asthma worse. Common asthma triggers  include:  Dust.  Smoke. This includes campfire smoke and secondhand smoke from tobacco products.  Pet dander.  Trees, grasses or pollens.  Very cold, dry, or humid air.  Mold.  Foods that contain high amounts of sulfites.  Strong smells.  Engine exhaust and air pollution.  Aerosol sprays and fumes from household cleaners.  Household pests and their droppings, including dust mites and cockroaches.  Certain medicines, including NSAIDs. Once you have determined your asthma triggers, take steps to avoid them. Depending on your triggers, you may be able to reduce the chance of an asthma attack by:  Keeping your home clean. Have someone dust and vacuum your home for you 1 or 2 times a week. If possible, have them use a high-efficiency particulate arrestance (HEPA) vacuum.  Washing your sheets weekly in hot water.  Using allergy-proof mattress covers and casings on your bed.  Keeping pets out of your home.  Taking care of mold and water problems in your home.  Avoiding areas where people smoke.  Avoiding using strong perfumes or odor sprays.  Avoid spending a lot of time outdoors when pollen counts are high and on very windy days.  Talking with your health care provider before stopping or starting any new medicines. Medicines Take over-the-counter and prescription medicines only as told by your health care provider. Many asthma attacks can be prevented by carefully following your medicine schedule. Taking your medicines correctly is especially important when you cannot avoid certain asthma triggers. Even if you are doing well, do not stop taking your medicine and do not take less medicine. Act quickly If an asthma attack happens, acting quickly can decrease how severe it is and how long it lasts. Take these actions:  Pay attention to your symptoms. If you are coughing, wheezing, or having difficulty breathing, do not wait to see if your symptoms go away on their own. Follow  your asthma action plan.  If you have followed your asthma action plan and your symptoms are not improving, call your health care provider or seek immediate medical care at the nearest hospital. It is important to write down how often you need to use your fast-acting rescue inhaler. You can track how often you use an inhaler in your journal. If you are using your rescue inhaler more often, it may mean that your asthma is not under control. Adjusting your asthma treatment plan may help you to prevent future asthma attacks and help you to gain better control of your condition. How can I prevent an asthma attack when I exercise? Exercise is a common asthma  trigger. To prevent asthma attacks during exercise:  Follow advice from your health care provider about whether you should use your fast-acting inhaler before exercising. Many people with asthma experience exercise-induced bronchoconstriction (EIB). This condition often worsens during vigorous exercise in cold, humid, or dry environments. Usually, people with EIB can stay very active by using a fast-acting inhaler before exercising.  Avoid exercising outdoors in very cold or humid weather.  Avoid exercising outdoors when pollen counts are high.  Warm up and cool down when exercising.  Stop exercising right away if asthma symptoms start. Consider taking part in exercises that are less likely to cause asthma symptoms such as:  Indoor swimming.  Biking.  Walking.  Hiking.  Playing football. This information is not intended to replace advice given to you by your health care provider. Make sure you discuss any questions you have with your health care provider. Document Released: 03/22/2009 Document Revised: 12/03/2015 Document Reviewed: 09/18/2015 Elsevier Interactive Patient Education  2019 [employer]  Asthma Action Plan, Adult Introduction An asthma action plan helps you understand how to manage your asthma and what to do when you  have an asthma attack. The action plan is a color-coded plan that lists the symptoms that indicate whether or not your condition is under control and what actions to take.  If you have symptoms in the green zone, it means you are doing well.  If you have symptoms in the yellow zone, it means you are having problems.  If you have symptoms in the red zone, you need medical care right away. Follow the plan that you and your health care provider develop. Review your plan with your health care provider at each visit. What triggers your asthma? Knowing the things that can trigger an asthma attack or make your asthma symptoms worse is very important. Talk to your health care provider about your asthma triggers and how to avoid them. Record your known asthma triggers here: _______________ What is your personal best peak flow reading? If you use a peak flow meter, determine your personal best reading. Record it here: _______________ Red zone Symptoms in this zone mean that you should get medical help right away. You will likely feel distressed and have symptoms at rest that restrict your activity. You are in the red zone if:  You are breathing hard and quickly.  Your nose opens wide, your ribs show, and your neck muscles become visible when you breathe in.  Your lips, fingers, or toes are a bluish color.  You have trouble speaking in full sentences.  Your peak flow reading is less than __________ (less than 50% of your personal best).  Your symptoms do not improve within 15-20 minutes after you use your reliever or rescue medicine (bronchodilator). If you have any of these symptoms:  Call your local emergency services (911 in the [place] or go to the nearest emergency room.  Use your reliever or rescue medicine. ? Start a nebulizer treatment or take 2-4 puffs from a metered-dose inhaler with a spacer. ? Repeat this action every 15-20 minutes until help arrives. Yellow zone Symptoms in this  zone mean that your condition may be getting worse. You may have symptoms that interfere with exercise, are noticeably worse after exposure to triggers, or are worse at the first sign of a cold (upper respiratory infection). These may include:  Waking from sleep.  Coughing, especially at night or first thing in the morning.  Mild wheezing.  Chest tightness.  A peak  flow reading that is __________ to __________ (50-79% of your personal best). If you have any of these symptoms:  Add the following medicine to the ones that you use daily: ? Reliever or rescue medicine and dosage: _______________ ? Additional medicine and dosage: _______________ Call your health care provider if:  You remain in the yellow zone for __________ hours.  You are using a reliever or rescue medicine more than 2-3 times a week. Green zone This zone means that your asthma is under control. You may not have any symptoms while you are in the green zone. This means that you:  Have no coughing or wheezing, even while you are working or playing.  Sleep through the night.  Are breathing well.  Have a peak flow reading that is above __________ (80% of your personal best or greater). If you are in the green zone, continue to manage your asthma as directed:  Take these medicines every day: ? Controller medicine and dosage: _______________ ? Controller medicine and dosage: _______________ ? Controller medicine and dosage: _______________ ? Controller medicine and dosage: _______________  Before exercise, use this reliever or rescue medicine: _______________ Call your health care provider if you are using a reliever or rescue medicine more than 2-3 times a week. Where to find more information You can find more information about asthma from:  Centers for Disease Control and Prevention: SendThoughts.com.pt  American Lung Association: www.lung.org This information is not intended to replace advice given to you by  your health care provider. Make sure you discuss any questions you have with your health care provider. Document Released: 01/29/2009 Document Revised: 12/13/2016 Document Reviewed: 12/13/2016 Elsevier Interactive Patient Education  2019 [employer].

## 2018-06-26 NOTE — Progress Notes (Signed)
Assessment & Plan:  Wendy Morgan was seen today for hospitalization follow-up.  Diagnoses and all orders for this visit:  Poorly controlled persistent asthma -     montelukast (SINGULAIR) 10 MG tablet; Take 1 tablet (10 mg total) by mouth at bedtime. -     budesonide-formoterol (SYMBICORT) 80-4.5 MCG/ACT inhaler; Inhale 2 puffs into the lungs 2 (two) times daily. -     albuterol (PROVENTIL HFA;VENTOLIN HFA) 108 (90 Base) MCG/ACT inhaler; Inhale 1-2 puffs into the lungs every 4 (four) hours as needed for wheezing or shortness of breath. -     cetirizine (ZYRTEC) 10 MG tablet; Take 1 tablet (10 mg total) by mouth daily.  Leukocytosis, unspecified type -     Lipid panel -     CBC    Patient has been counseled on age-appropriate routine health concerns for screening and prevention. These are reviewed and up-to-date. Referrals have been placed accordingly. Immunizations are up-to-date or declined.    Subjective:   Chief Complaint  Patient presents with  . Hospitalization Follow-up    Pt. is here to follow-up on HFU on asthma   HPI Wendy Morgan 43 y.o. female presents to office today to establish care. She has a PMH of asthma which has been poorly controlled. She was admitted to the hospital on 05-28-2018 for asthma exacerbation. She has been evaluated approximately 29 times in the ED or urgent care over the past year with asthma exacerbation. Diagnosed 2-3 years ago. She is a former smoker who stopped smoking >10 years ago. Aggravating factors: Smoke, smokers, weather and temp changes, sinus infection, allergy triggers. 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 She only uses albuterol "when I get real sick". She has been admitted and treated for PNA in the past (01-2018). She has been on multiple inhalers in the past including QVAR, advair and dulera which helped to relieve her asthma symptoms however they  were too expensive. She currently denies cough, wheezing or shortness of breath.  She will return in 3 weeks for follow up.     Review of Systems  Constitutional: Negative for fever, malaise/fatigue and weight loss.  HENT: Negative.  Negative for nosebleeds.   Eyes: Negative.  Negative for blurred vision, double vision and photophobia.  Respiratory: Negative.  Negative for cough, hemoptysis, sputum production, shortness of breath and wheezing.   Cardiovascular: Negative.  Negative for chest pain, palpitations and leg swelling.  Gastrointestinal: Negative.  Negative for heartburn, nausea and vomiting.  Musculoskeletal: Negative.  Negative for myalgias.  Neurological: Negative.  Negative for dizziness, focal weakness, seizures and headaches.  Psychiatric/Behavioral: Negative.  Negative for suicidal ideas.    Past Medical History:  Diagnosis Date  . Asthma   . Bronchiectasis with acute exacerbation [facility])     Past Surgical History:  Procedure Laterality Date  . TUBAL LIGATION      Family History  Problem Relation Age of Onset  . Kidney disease Neg Hx   . Anxiety disorder Neg Hx   . Depression Neg Hx     Social History Reviewed with no changes to be made today.   Outpatient Medications Prior to Visit  Medication Sig Dispense Refill  . albuterol (PROVENTIL) (5 MG/ML) 0.5% nebulizer solution Take 1 mL (5 mg total) by nebulization every 6 (six) hours as needed for wheezing or shortness of breath. 20 mL 0  . albuterol (PROVENTIL HFA;VENTOLIN  HFA) 108 (90 Base) MCG/ACT inhaler Inhale 1-2 puffs into the lungs every 4 (four) hours as needed for wheezing or shortness of breath. 1 Inhaler 0  . budesonide-formoterol (SYMBICORT) 80-4.5 MCG/ACT inhaler Inhale 2 puffs into the lungs 2 (two) times daily. 1 Inhaler 1  . montelukast (SINGULAIR) 10 MG tablet Take 1 tablet (10 mg total) by mouth at bedtime. 30 tablet 0   No facility-administered medications prior to visit.     No Known  Allergies     Objective:    BP 133/80 (BP Location: Right Arm, Patient Position: Sitting, Cuff Size: Normal)   Pulse 77   Temp 98.1 F (36.7 C) (Oral)   Ht 5\' 4"  (1.626 m)   Wt 208 lb 3.2 oz (94.4 kg)   LMP 06/15/2018   SpO2 98%   BMI 35.74 kg/m  Wt Readings from Last 3 Encounters:  06/26/18 208 lb 3.2 oz (94.4 kg)  05/29/18 180 lb (81.6 kg)  05/07/18 160 lb (72.6 kg)    Physical Exam Vitals signs and nursing note reviewed.  Constitutional:      Appearance: She is well-developed.  HENT:     Head: Normocephalic and atraumatic.  Neck:     Musculoskeletal: Normal range of motion.  Cardiovascular:     Rate and Rhythm: Normal rate and regular rhythm.     Heart sounds: Normal heart sounds. No murmur. No friction rub. No gallop.   Pulmonary:     Effort: Pulmonary effort is normal. No tachypnea or respiratory distress.     Breath sounds: Normal breath sounds. No decreased breath sounds, wheezing, rhonchi or rales.  Chest:     Chest wall: No tenderness.  Abdominal:     General: Bowel sounds are normal.     Palpations: Abdomen is soft.  Musculoskeletal: Normal range of motion.  Skin:    General: Skin is warm and dry.  Neurological:     Mental Status: She is alert and oriented to person, place, and time.     Coordination: Coordination normal.  Psychiatric:        Behavior: Behavior normal. Behavior is cooperative.        Thought Content: Thought content normal.        Judgment: Judgment normal.          Patient has been counseled extensively about nutrition and exercise as well as the importance of adherence with medications and regular follow-up. The patient was given clear instructions to go to ER or return to medical center if symptoms don't improve, worsen or new problems develop. The patient verbalized understanding.   Follow-up: Return in about 3 weeks (around 07/17/2018) for asthma.   Claiborne Rigg, FNP-BC [facility] [place], [place] [phone]   06/27/2018, 12:05 AM

## 2018-06-27 ENCOUNTER — Encounter: Payer: Self-pay | Admitting: Nurse Practitioner

## 2018-06-27 LAB — CBC
Hematocrit: 42.4 % (ref 34.0–46.6)
Hemoglobin: 14.4 g/dL (ref 11.1–15.9)
MCH: 29.7 pg (ref 26.6–33.0)
MCHC: 34 g/dL (ref 31.5–35.7)
MCV: 87 fL (ref 79–97)
Platelets: 355 10*3/uL (ref 150–450)
RBC: 4.85 x10E6/uL (ref 3.77–5.28)
RDW: 13.4 % (ref 11.7–15.4)
WBC: 11.4 10*3/uL — ABNORMAL HIGH (ref 3.4–10.8)

## 2018-06-27 LAB — LIPID PANEL
Chol/HDL Ratio: 2.7 ratio (ref 0.0–4.4)
Cholesterol, Total: 191 mg/dL (ref 100–199)
HDL: 71 mg/dL (ref >39)
LDL Calculated: 108 mg/dL — ABNORMAL HIGH (ref 0–99)
Triglycerides: 61 mg/dL (ref 0–149)
VLDL CHOLESTEROL CAL: 12 mg/dL (ref 5–40)

## 2018-06-27 MED FILL — !VENTOLIN HFA INHALER: 108 (90 BAS | 16 days supply | Qty: 18 | Fill #0

## 2018-06-27 MED FILL — MONTELUKAST SOD 10 MG TAB: 10 | 30 days supply | Qty: 30 | Fill #0

## 2018-06-27 MED FILL — !SYMBICORT 80-4.5 MCG INH: 80-4.5 | 30 days supply | Qty: 1 | Fill #0

## 2018-06-28 ENCOUNTER — Telehealth: Payer: Self-pay

## 2018-06-28 NOTE — Telephone Encounter (Signed)
-----   Message from Claiborne Rigg, NP sent at 06/27/2018 10:47 PM EDT ----- Cholesterol levels practically normal. WBC still elevated. Will continue to monitor. Could possibly be related to recent steroid use.

## 2018-06-28 NOTE — Telephone Encounter (Signed)
CMA spoke to patient to inform on results.  Pt. Verified DOB. Pt. Understood.  

## 2018-08-05 ENCOUNTER — Other Ambulatory Visit: Payer: Self-pay

## 2018-08-05 ENCOUNTER — Encounter: Payer: Self-pay | Admitting: Nurse Practitioner

## 2018-08-05 ENCOUNTER — Ambulatory Visit: Payer: Self-pay | Attending: Nurse Practitioner | Admitting: Nurse Practitioner

## 2018-08-05 DIAGNOSIS — J45998 Other asthma: Secondary | ICD-10-CM

## 2018-08-05 DIAGNOSIS — J45909 Unspecified asthma, uncomplicated: Secondary | ICD-10-CM

## 2018-08-05 MED ORDER — MONTELUKAST SODIUM 10 MG PO TABS: 10.0000 mg | ORAL_TABLET | Freq: Every day | ORAL | 1 refills | Status: DC

## 2018-08-05 MED ORDER — CETIRIZINE HCL 10 MG PO TABS: 10.0000 mg | ORAL_TABLET | Freq: Every day | ORAL | 11 refills | Status: DC

## 2018-08-05 MED ORDER — ALBUTEROL SULFATE HFA 108 (90 BASE) MCG/ACT IN AERS: 1.0000 | INHALATION_SPRAY | RESPIRATORY_TRACT | 1 refills | Status: DC | PRN

## 2018-08-05 MED FILL — !VENTOLIN HFA INHALER: 108 (90 BAS | 18 days supply | Qty: 18 | Fill #0

## 2018-08-05 MED FILL — MONTELUKAST SOD 10 MG TAB: 10 | 30 days supply | Qty: 30 | Fill #0

## 2018-08-05 NOTE — Progress Notes (Signed)
Virtual Visit via Telephone Note  I connected with Wendy Morgan on 08/05/18  at   4:10 PM EDT  EDT by telephone and verified that I am speaking with the correct person using two identifiers.   Consent I discussed the limitations, risks, security and privacy concerns of performing an evaluation and management service by telephone and the availability of in person appointments. I also discussed with the patient that there may be a patient responsible charge related to this service. The patient expressed understanding and agreed to proceed.   Location of Patient: Private Residence     Location of Provider: [facility]    Persons participating in Telemedicine visit: Wendy Denver FNP-BC [facility] [place] CMA Doreene Burke Brett Fairy    History of Present Illness: Telemedicine follow up for asthma. She was prescribed singulair and zyrtec at her last visit with me on 3.11.2020 after she was  evaluated in the office for hospital follow-up to asthma exacerbation.  She had been evaluated approximately 29 times in the ED the past year with asthma exacerbation.  She is a former smoker stopped smoking greater than 10 years ago.  Aggravating factors to her asthma: Smoke,  temperature changes.  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  She uses albuterol sparingly.  She has been on multiple inhalers in the past including Qvar, Advair and Dulera.  Currently taking Symbicort, zyrtec in the am, singulair in the pm as prescribed and which provides significant relief of her asthma symptoms.  She currently denies any chest pain, shortness of breath, or wheezing.   Observations/Objective: Awake, alert and oriented x3.    Assessment and Plan: [name] was seen today for follow-up.  Diagnoses and all orders for this visit:  Asthma due to seasonal allergies -     albuterol (VENTOLIN HFA) 108 (90  Base) MCG/ACT inhaler; Inhale 1-2 puffs into the lungs every 4 (four) hours as needed for wheezing or shortness of breath. -     cetirizine (ZYRTEC) 10 MG tablet; Take 1 tablet (10 mg total) by mouth daily. -     montelukast (SINGULAIR) 10 MG tablet; Take 1 tablet (10 mg total) by mouth at bedtime.     Follow Up Instructions Return in about 3 months (around 11/04/2018).     I discussed the assessment and treatment plan with the patient. The patient was provided an opportunity to ask questions and all were answered. The patient agreed with the plan and demonstrated an understanding of the instructions.   The patient was advised to call back or seek an in-person evaluation if the symptoms worsen or if the condition fails to improve as anticipated.  I provided 10 minutes of non-face-to-face time during this encounter including median intraservice time, reviewing previous notes, labs, imaging, medications and explaining diagnosis and management.  Claiborne Rigg, FNP-BC

## 2018-08-08 MED FILL — SYMBICORT 80-4.5 MCG INH: 80-4.5 | 30 days supply | Qty: 10 | Fill #1

## 2018-09-23 MED FILL — SYMBICORT 80-4.5 MCG INH: 80-4.5 | 30 days supply | Qty: 10 | Fill #2

## 2018-09-23 MED FILL — ALBUTEROL SULFATE HFA 108 (: 108 (90 BAS | 16 days supply | Qty: 18 | Fill #1

## 2018-11-04 ENCOUNTER — Encounter (HOSPITAL_COMMUNITY): Payer: Self-pay

## 2018-11-04 ENCOUNTER — Other Ambulatory Visit: Payer: Self-pay

## 2018-11-04 ENCOUNTER — Emergency Department (HOSPITAL_COMMUNITY)
Admission: EM | Admit: 2018-11-04 | Discharge: 2018-11-04 | Disposition: A | Discharge status: Home / Self Care | Payer: Self-pay | Attending: Emergency Medicine | Admitting: Emergency Medicine

## 2018-11-04 DIAGNOSIS — Z79899 Other long term (current) drug therapy: Secondary | ICD-10-CM | POA: Insufficient documentation

## 2018-11-04 DIAGNOSIS — J45909 Unspecified asthma, uncomplicated: Secondary | ICD-10-CM

## 2018-11-04 DIAGNOSIS — Z87891 Personal history of nicotine dependence: Secondary | ICD-10-CM | POA: Insufficient documentation

## 2018-11-04 DIAGNOSIS — J4541 Moderate persistent asthma with (acute) exacerbation: Secondary | ICD-10-CM | POA: Insufficient documentation

## 2018-11-04 LAB — POC URINE PREG, ED: Preg Test, Ur: NEGATIVE

## 2018-11-04 MED ORDER — PREDNISONE 20 MG PO TABS
40.0000 mg | ORAL_TABLET | Freq: Once | ORAL | Status: AC
  Administered 2018-11-04: 40 mg via ORAL
  Filled 2018-11-04: qty 2

## 2018-11-04 MED ORDER — MOMETASONE FURO-FORMOTEROL FUM 100-5 MCG/ACT IN AERO
2.0000 | INHALATION_SPRAY | Freq: Two times a day (BID) | RESPIRATORY_TRACT | Status: DC
  Administered 2018-11-04: 2 via RESPIRATORY_TRACT
  Filled 2018-11-04: qty 8.8

## 2018-11-04 MED ORDER — IPRATROPIUM-ALBUTEROL 20-100 MCG/ACT IN AERS: 8.0000 | INHALATION_SPRAY | Freq: Once | RESPIRATORY_TRACT | Status: DC

## 2018-11-04 MED ORDER — ALBUTEROL SULFATE HFA 108 (90 BASE) MCG/ACT IN AERS
8.0000 | INHALATION_SPRAY | Freq: Once | RESPIRATORY_TRACT | Status: AC
  Administered 2018-11-04: 8 via RESPIRATORY_TRACT

## 2018-11-04 MED ORDER — AEROCHAMBER PLUS FLO-VU MEDIUM MISC
1.0000 | Freq: Once | Status: DC
  Filled 2018-11-04: qty 1

## 2018-11-04 MED ORDER — MONTELUKAST SODIUM 10 MG PO TABS: 10.0000 mg | ORAL_TABLET | Freq: Every day | ORAL | 1 refills | Status: DC

## 2018-11-04 MED ORDER — ALBUTEROL SULFATE HFA 108 (90 BASE) MCG/ACT IN AERS
2.0000 | INHALATION_SPRAY | Freq: Once | RESPIRATORY_TRACT | Status: AC
  Administered 2018-11-04: 2 via RESPIRATORY_TRACT
  Filled 2018-11-04: qty 6.7

## 2018-11-04 MED ORDER — PREDNISONE 20 MG PO TABS: 20.0000 mg | ORAL_TABLET | Freq: Two times a day (BID) | ORAL | 0 refills | Status: AC

## 2018-11-04 NOTE — ED Provider Notes (Signed)
[facility] Note   CSN: [id number] Arrival date & time: 11/04/18  1229     History   Chief Complaint Chief Complaint  Patient presents with  . Asthma    HPI Wendy Morgan is a 43 y.o. female.     HPI   Patient is a 43 year old female with past medical history of asthma and bronchiectasis presenting for wheezing and cough.  Patient reports that this began 3 days ago.  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  She has been without her inhalers for approximately 1 week.  She takes both controller inhaler, Symbicort, and albuterol as needed.  She denies fever or chills.  She reports that cough is productive of clear mucous.  No DVT/PE history, does not take hormones, and has not been hospitalized or immobilized in the past month.   Past Medical History:  Diagnosis Date  . Asthma   . Bronchiectasis with acute exacerbation [facility](HCC)     Patient Active Problem List   Diagnosis Date Noted  . Asthma exacerbation 05/29/2018    Past Surgical History:  Procedure Laterality Date  . TUBAL LIGATION       OB History   No obstetric history on file.      Home Medications    Prior to Admission medications   Medication Sig Start Date End Date Taking? Authorizing Provider  albuterol (PROVENTIL) (5 MG/ML) 0.5% nebulizer solution Take 1 mL (5 mg total) by nebulization every 6 (six) hours as needed for wheezing or shortness of breath. Patient not taking: Reported on 08/05/2018 04/23/18   Domenick GongMortenson, Ashley, MD  albuterol (VENTOLIN HFA) 108 (90 Base) MCG/ACT inhaler Inhale 1-2 puffs into the lungs every 4 (four) hours as needed for wheezing or shortness of breath. 08/05/18   Claiborne RiggFleming, Zelda W, NP  budesonide-formoterol (SYMBICORT) 80-4.5 MCG/ACT inhaler Inhale 2 puffs into the lungs 2 (two) times daily. 06/26/18   Claiborne RiggFleming, Zelda W, NP  cetirizine (ZYRTEC) 10 MG tablet  Take 1 tablet (10 mg total) by mouth daily. 08/05/18   Claiborne RiggFleming, Zelda W, NP  montelukast (SINGULAIR) 10 MG tablet Take 1 tablet (10 mg total) by mouth at bedtime. 08/05/18 11/03/18  Claiborne RiggFleming, Zelda W, NP    Family History Family History  Problem Relation Age of Onset  . Kidney disease Neg Hx   . Anxiety disorder Neg Hx   . Depression Neg Hx     Social History Social History   Tobacco Use  . Smoking status: Former Smoker    Quit date: 1998    Years since quitting: 22.5  . Smokeless tobacco: Never Used  Substance Use Topics  . Alcohol use: No  . Drug use: No     Allergies   Patient has no known allergies.   Review of Systems Review of Systems  Constitutional: Negative for chills and fever.  HENT: Negative for congestion and rhinorrhea.   Respiratory: Positive for cough and wheezing. Negative for shortness of breath.      Physical Exam Updated Vital Signs BP 124/79   Pulse 70   Temp 98.1 F (36.7 C)   Resp 16   SpO2 98%   Physical Exam Vitals signs and nursing note reviewed.  Constitutional:      General: She is not in acute distress.    Appearance: She is well-developed. She is not diaphoretic.     Comments: Sitting comfortably in bed.  HENT:     Head: Normocephalic and atraumatic.     Mouth/Throat:     Pharynx: No oropharyngeal exudate or posterior oropharyngeal erythema.  Eyes:     General:        Right eye: No discharge.        Left eye: No discharge.     Conjunctiva/sclera: Conjunctivae normal.     Comments: EOMs normal to gross examination.  Neck:     Musculoskeletal: Normal range of motion.  Cardiovascular:     Rate and Rhythm: Normal rate and regular rhythm.     Heart sounds: Normal heart sounds.  Pulmonary:     Effort: Pulmonary effort is normal.     Breath sounds: Wheezing present.     Comments: Speaks in full sentences. No tachypnea.  Soft wheezes in all lung fields.  Abdominal:     General: There is no distension.  Musculoskeletal:  Normal range of motion.  Skin:    General: Skin is warm and dry.  Neurological:     Mental Status: She is alert.     Comments: Cranial nerves intact to gross observation. Patient moves extremities without difficulty.  Psychiatric:        Behavior: Behavior normal.        Thought Content: Thought content normal.        Judgment: Judgment normal.      ED Treatments / Results  Labs (all labs ordered are listed, but only abnormal results are displayed) Labs Reviewed  POC URINE PREG, ED    EKG None  Radiology No results found.  Procedures Procedures (including critical care time)  Medications Ordered in ED Medications  AeroChamber Plus Flo-Vu Medium MISC 1 each (has no administration in time range)  mometasone-formoterol (DULERA) 100-5 MCG/ACT inhaler 2 puff (has no administration in time range)  albuterol (VENTOLIN HFA) 108 (90 Base) MCG/ACT inhaler 2 puff (2 puffs Inhalation Given 11/04/18 1541)     Initial Impression / Assessment and Plan / ED Course  I have reviewed the triage vital signs and the nursing notes.  Pertinent labs & imaging results that were available during my care of the patient were reviewed by me and considered in my medical decision making (see chart for details).        This is a well-appearing 43 year old female with a past medical history of uncontrolled asthma presenting for wheezing.  She reports being out of her inhalers x1 week and having exposure to smoke which she feels exacerbated her asthma.  She has normal vital signs here in the emergency department and I personally ambulated the patient and her oxygen saturation remained above 95%.  No asymmetric wheeze or rales and clinically not consistent with PNA. She does have diffuse wheezing.  Will give HFA equivalent of nebulizer therapy, and represcribed patient's controller and short acting medications.  Will administer prednisone.  Medication management was discussed with pharmacy and  substituted Symbicort for [facility].  Patient instructed on how to use this medication.  4:16 PM Patient reports feeling much better.  Wheezing improved on exam.   Patient instructed to follow-up with her primary care provider to ensure no lapses in medication.  Return precautions given for any increasing shortness of breath, productive cough or fevers.  Patient is in understanding and agrees with the plan of care.  Final Clinical Impressions(s) / ED Diagnoses   Final diagnoses:  Moderate persistent asthma with acute exacerbation    ED Discharge Orders  Ordered    predniSONE (DELTASONE) 20 MG tablet  2 times daily with meals     11/04/18 1608    montelukast (SINGULAIR) 10 MG tablet  Daily at bedtime     11/04/18 1608           Elisha PonderMurray, Tressie Ragin B, PA-C 11/04/18 1618    Melene PlanFloyd, Dan, DO 11/04/18 1654

## 2018-11-04 NOTE — ED Notes (Signed)
Patient reports running out of her daily meds for asthma on Thursday and only having her "red" inhaler and that isn't working.  symbicort and "regular" albuterol inhaler she ran out of on Thursday.

## 2018-11-04 NOTE — ED Triage Notes (Signed)
Pt reports asthma exacerbation since Friday, states she has been out of her inhalers for the past 4-5 days. No audible wheezing noted in triage. Resp e.u.

## 2018-11-04 NOTE — Discharge Instructions (Signed)
Please read and follow all provided instructions.  Your diagnoses today include:  1. Moderate persistent asthma with acute exacerbation   2. Asthma due to seasonal allergies     Tests performed today include: TESTS Vital signs. See below for your results today.   Medications prescribed:   Take any prescribed medications only as directed.  You are prescribed prednisone, a steroid. This is a medication to help reduce inflammation in the lungs.  Common side effects include upset stomach/nausea. You may take this medicine with food if this occurs. Other side effects include restlessness, difficulty sleeping, and increased sweating. Call your healthcare provider if these do not resolve after finishing the medication.  This medicine may increase your blood sugar so additional careful monitoring is needed of blood sugar if you have diabetes. Call your healthcare provider for any signs/symtpoms of high blood sugar such as confusion, feeling sleepy, more thirst, more hunger, passing urine more often, flushing, fast breathing, or breath that smells like fruit.  Please take 2 puffs of your albuterol inhaler every 4 hours for the next 5 days.  We had to substitute your Symbicort prescription for [facility].  Please take 2 puffs twice a day of Dulera.  Please resume your montelukast prescription.  Home care instructions:  Follow any educational materials contained in this packet.  Follow-up instructions: Please follow-up with your primary care provider in the next 3 days for further evaluation of your symptoms and management of your asthma.  Return instructions:  Please return to the Emergency Department if you experience worsening symptoms. Please return with worsening wheezing, shortness of breath, or difficulty breathing. Return with persistent fever above 101F.  Please return if you have any other emergent concerns.  Additional Information:  Your vital signs today were: BP 124/79     Pulse 70    Temp 98.1 F (36.7 C)    Resp 16    SpO2 98%  If your blood pressure (BP) was elevated above 135/85 this visit, please have this repeated by your doctor within one month. --------------

## 2018-12-23 IMAGING — DX DG CHEST 2V
2 series · 2 of 2 positions shown · non-contrast
Comparison: 12/25/2017, 09/12/2017

CLINICAL DATA: Cough and wheezing

EXAM:
CHEST - 2 VIEW

[chest pa]
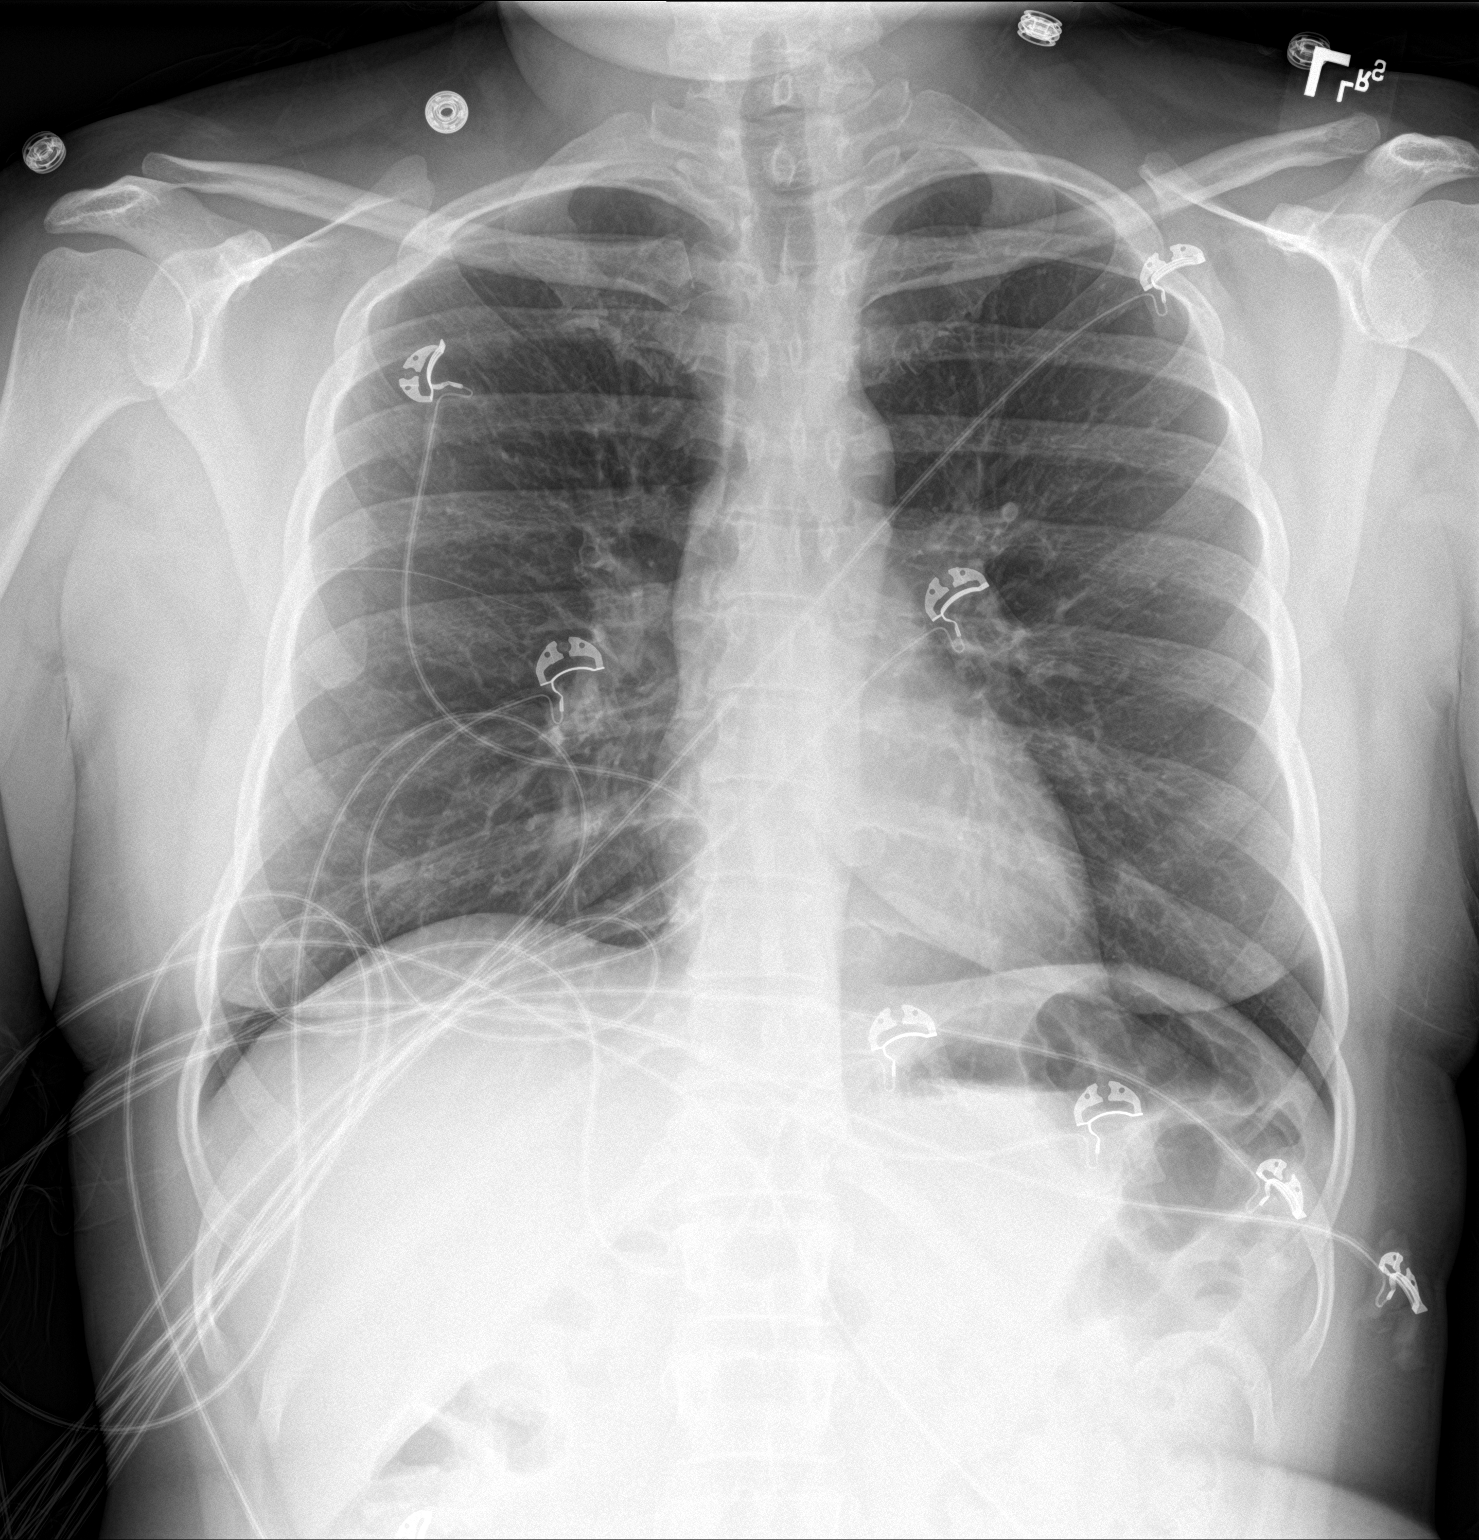

[chest lat]
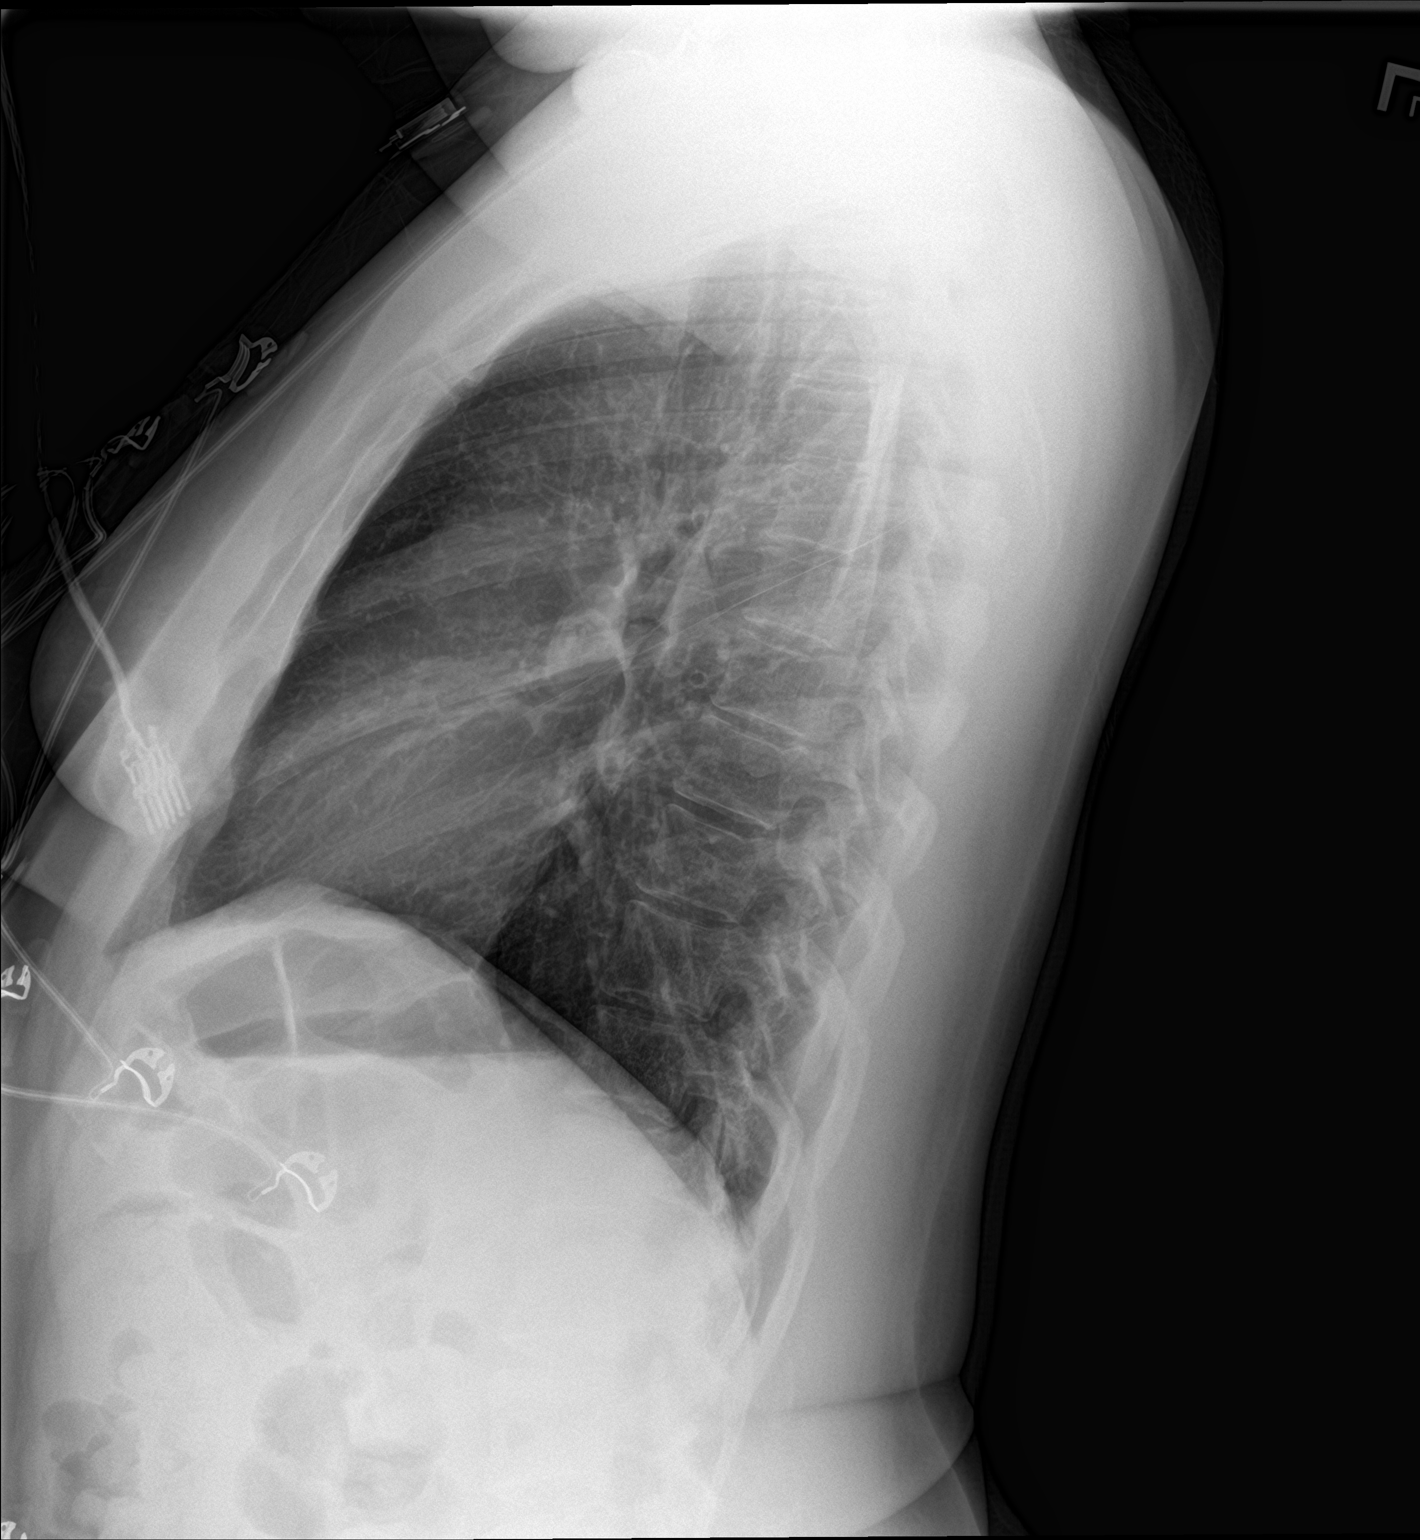

[2 of 2 positions shown; findings below may reference images not displayed]

FINDINGS: Streaky anterior lung opacity on lateral view, likely right middle
lobe. No pleural effusion. Normal heart size. No pneumothorax
IMPRESSION: Streaky atelectasis or minimal infiltrate, likely right middle lobe.

## 2019-01-28 ENCOUNTER — Telehealth: Payer: Self-pay | Admitting: Pharmacist

## 2019-01-28 MED FILL — !VENTOLIN HFA INHALER: 108 (90 BAS | 16 days supply | Qty: 18 | Fill #1

## 2019-01-28 NOTE — Telephone Encounter (Signed)
Pharmacy unable to get Symbicort for patient. They recommend to switch to [facility] or Advair.   Will route to PCP.

## 2019-01-29 ENCOUNTER — Encounter (HOSPITAL_COMMUNITY): Payer: Self-pay

## 2019-01-29 ENCOUNTER — Emergency Department (HOSPITAL_COMMUNITY): Payer: Self-pay

## 2019-01-29 ENCOUNTER — Other Ambulatory Visit: Payer: Self-pay

## 2019-01-29 ENCOUNTER — Other Ambulatory Visit: Payer: Self-pay | Admitting: Nurse Practitioner

## 2019-01-29 ENCOUNTER — Emergency Department (HOSPITAL_COMMUNITY)
Admission: EM | Admit: 2019-01-29 | Discharge: 2019-01-30 | Disposition: A | Discharge status: Home / Self Care | Payer: Self-pay | Attending: Emergency Medicine | Admitting: Emergency Medicine

## 2019-01-29 DIAGNOSIS — J4541 Moderate persistent asthma with (acute) exacerbation: Secondary | ICD-10-CM | POA: Insufficient documentation

## 2019-01-29 DIAGNOSIS — Z79899 Other long term (current) drug therapy: Secondary | ICD-10-CM | POA: Insufficient documentation

## 2019-01-29 DIAGNOSIS — Z87891 Personal history of nicotine dependence: Secondary | ICD-10-CM | POA: Insufficient documentation

## 2019-01-29 MED ORDER — FLUTICASONE-SALMETEROL 250-50 MCG/DOSE IN AEPB: 1.0000 | INHALATION_SPRAY | Freq: Two times a day (BID) | RESPIRATORY_TRACT | 3 refills | Status: DC

## 2019-01-29 NOTE — Telephone Encounter (Signed)
Please let patient know inhaler has been sent

## 2019-01-29 NOTE — ED Triage Notes (Signed)
Pt reports worsening asthma over the past 2 days, using inhalers and neb treatments at home without relief.

## 2019-01-30 MED ORDER — DEXAMETHASONE SODIUM PHOSPHATE 10 MG/ML IJ SOLN
10.0000 mg | Freq: Once | INTRAMUSCULAR | Status: AC
  Administered 2019-01-30: 01:00:00 10 mg via INTRAMUSCULAR
  Filled 2019-01-30: qty 1

## 2019-01-30 MED ORDER — ALBUTEROL SULFATE HFA 108 (90 BASE) MCG/ACT IN AERS
4.0000 | INHALATION_SPRAY | Freq: Once | RESPIRATORY_TRACT | Status: AC
  Administered 2019-01-30: 4 via RESPIRATORY_TRACT

## 2019-01-30 MED ORDER — ALBUTEROL SULFATE HFA 108 (90 BASE) MCG/ACT IN AERS
8.0000 | INHALATION_SPRAY | Freq: Once | RESPIRATORY_TRACT | Status: AC
  Administered 2019-01-30: 8 via RESPIRATORY_TRACT
  Filled 2019-01-30: qty 6.7

## 2019-01-30 MED FILL — !ADVAIR 250/50 DISKUS: 250-50 | 30 days supply | Qty: 60 | Fill #0

## 2019-01-30 NOTE — ED Provider Notes (Signed)
[place] EMERGENCY DEPARTMENT Provider Note   CSN: [id number] Arrival date & time: 01/29/19  2140     History   Chief Complaint Chief Complaint  Patient presents with  . Asthma    HPI Wendy Morgan is a 43 y.o. female.     HPI  This a 43 year old female with history of asthma who presents with shortness of breath.  She reports 1 today day history of worsening shortness of breath which she attributes to her asthma.  She reports a dry cough.  No upper respiratory symptoms including congestion, sore throat.  She has not had any sick contacts or fevers.  She reports that she has been masking appropriately.  She states that she has used her inhaler frequently today with minimal relief.  She denies any prior history of asthma intubations or admissions.  Denies chest pain or lower extremity swelling.  Past Medical History:  Diagnosis Date  . Asthma   . Bronchiectasis with acute exacerbation [facility])     Patient Active Problem List   Diagnosis Date Noted  . Asthma exacerbation 05/29/2018    Past Surgical History:  Procedure Laterality Date  . TUBAL LIGATION       OB History   No obstetric history on file.      Home Medications    Prior to Admission medications   Medication Sig Start Date End Date Taking? Authorizing Provider  albuterol (VENTOLIN HFA) 108 (90 Base) MCG/ACT inhaler Inhale 1-2 puffs into the lungs every 4 (four) hours as needed for wheezing or shortness of breath. 08/05/18   Gildardo Pounds, NP  budesonide-formoterol (SYMBICORT) 80-4.5 MCG/ACT inhaler Inhale 2 puffs into the lungs 2 (two) times daily. 06/26/18   Gildardo Pounds, NP  cetirizine (ZYRTEC) 10 MG tablet Take 1 tablet (10 mg total) by mouth daily. 08/05/18   Gildardo Pounds, NP  Fluticasone-Salmeterol (ADVAIR DISKUS) 250-50 MCG/DOSE AEPB Inhale 1 puff into the lungs 2 (two) times daily. 01/29/19   Gildardo Pounds, NP  montelukast (SINGULAIR) 10 MG tablet Take 1 tablet (10  mg total) by mouth at bedtime. 11/04/18 02/02/19  Albesa Seen, PA-C    Family History Family History  Problem Relation Age of Onset  . Kidney disease Neg Hx   . Anxiety disorder Neg Hx   . Depression Neg Hx     Social History Social History   Tobacco Use  . Smoking status: Former Smoker    Quit date: 1998    Years since quitting: 22.8  . Smokeless tobacco: Never Used  Substance Use Topics  . Alcohol use: No  . Drug use: No     Allergies   Patient has no known allergies.   Review of Systems Review of Systems  Constitutional: Negative for fever.  Respiratory: Positive for cough, shortness of breath and wheezing.   Cardiovascular: Negative for chest pain.  Gastrointestinal: Negative for abdominal pain, nausea and vomiting.  Genitourinary: Negative for dysuria.  All other systems reviewed and are negative.    Physical Exam Updated Vital Signs BP 112/68   Pulse 70   Temp 98 F (36.7 C)   Resp 18   SpO2 96%   Physical Exam Vitals signs and nursing note reviewed.  Constitutional:      Appearance: She is well-developed. She is not ill-appearing.  HENT:     Head: Normocephalic and atraumatic.  Eyes:     Pupils: Pupils are equal, round, and reactive to light.  Neck:  Musculoskeletal: Neck supple.  Cardiovascular:     Rate and Rhythm: Normal rate and regular rhythm.     Heart sounds: Normal heart sounds.  Pulmonary:     Effort: Pulmonary effort is normal. No respiratory distress.     Breath sounds: Wheezing present.     Comments: Diffuse expiratory wheezing with fair air movement Abdominal:     Palpations: Abdomen is soft.     Tenderness: There is no abdominal tenderness.  Musculoskeletal:        General: No tenderness.     Right lower leg: No edema.     Left lower leg: No edema.  Skin:    General: Skin is warm and dry.  Neurological:     Mental Status: She is alert and oriented to person, place, and time.  Psychiatric:        Mood and Affect:  Mood normal.      ED Treatments / Results  Labs (all labs ordered are listed, but only abnormal results are displayed) Labs Reviewed - No data to display  EKG None  Radiology Dg Chest 2 View  Result Date: 01/29/2019 CLINICAL DATA:  Shortness of breath, worsening asthma EXAM: CHEST - 2 VIEW COMPARISON:  Most recent radiograph 05/29/2018 FINDINGS: Mild airways thickening. No consolidation, features of edema, pneumothorax, or effusion. Stable granuloma in the left mid lung. No acute osseous or soft tissue abnormality. The cardiomediastinal contours are unremarkable. IMPRESSION: Mild central airways thickening compatible with patient's history of asthma. No acute consolidation or other cardiopulmonary process. Electronically Signed   By: Kreg Shropshire M.D.   On: 01/29/2019 23:40    Procedures Procedures (including critical care time)  Medications Ordered in ED Medications  albuterol (VENTOLIN HFA) 108 (90 Base) MCG/ACT inhaler 8 puff (8 puffs Inhalation Given 01/30/19 0034)  dexamethasone (DECADRON) injection 10 mg (10 mg Intramuscular Given 01/30/19 0034)  albuterol (VENTOLIN HFA) 108 (90 Base) MCG/ACT inhaler 4 puff (4 puffs Inhalation Given by Other 01/30/19 0203)     Initial Impression / Assessment and Plan / ED Course  I have reviewed the triage vital signs and the nursing notes.  Pertinent labs & imaging results that were available during my care of the patient were reviewed by me and considered in my medical decision making (see chart for details).        Patient presents with shortness of breath.  Felt to be related to asthma.  She is wheezing on exam but nontoxic.  Vital signs are reassuring.  She is not hypoxic.  Patient was given 8 puffs albuterol and IM Decadron.  On multiple rechecks she is clinically improving.  She received a second treatment with 4 puffs of albuterol and on recheck, her breath sounds are mostly clear with only an occasional wheeze.  She states she  feels much better.  She was able to ambulate and maintain pulse ox 96 to 97%.  Chest x-ray is clear for pneumonia or any other abnormality.  Recommend continued scheduled albuterol over the next several hours.  After history, exam, and medical workup I feel the patient has been appropriately medically screened and is safe for discharge home. Pertinent diagnoses were discussed with the patient. Patient was given return precautions.   Final Clinical Impressions(s) / ED Diagnoses   Final diagnoses:  Moderate persistent asthma with exacerbation    ED Discharge Orders    None       Horton, Mayer Masker, MD 01/30/19 [phone]

## 2019-01-30 NOTE — ED Notes (Signed)
Pt ambulated and remained at 96-97%.

## 2019-01-30 NOTE — ED Notes (Signed)
Patient verbalizes understanding of discharge instructions. Opportunity for questioning and answers were provided. Armband removed by staff, pt discharged from ED.  

## 2019-01-30 NOTE — Telephone Encounter (Signed)
Attempt to reach patient to inform on medication. No answer and LVM for call back.

## 2019-01-30 NOTE — Discharge Instructions (Addendum)
You were seen today for your asthma.  Continue your albuterol every 4 puffs 2-4 hours as needed.  You were given a long-acting steroid.

## 2019-02-02 ENCOUNTER — Emergency Department (HOSPITAL_COMMUNITY)
Admission: EM | Admit: 2019-02-02 | Discharge: 2019-02-02 | Disposition: A | Discharge status: Home / Self Care | Payer: Self-pay | Attending: Emergency Medicine | Admitting: Emergency Medicine

## 2019-02-02 ENCOUNTER — Other Ambulatory Visit: Payer: Self-pay

## 2019-02-02 DIAGNOSIS — Z79899 Other long term (current) drug therapy: Secondary | ICD-10-CM | POA: Insufficient documentation

## 2019-02-02 DIAGNOSIS — J45909 Unspecified asthma, uncomplicated: Secondary | ICD-10-CM

## 2019-02-02 DIAGNOSIS — J4541 Moderate persistent asthma with (acute) exacerbation: Secondary | ICD-10-CM | POA: Insufficient documentation

## 2019-02-02 MED ORDER — PREDNISONE 10 MG PO TABS: 50.0000 mg | ORAL_TABLET | Freq: Every day | ORAL | 0 refills | Status: AC

## 2019-02-02 MED ORDER — PREDNISONE 20 MG PO TABS
60.0000 mg | ORAL_TABLET | Freq: Once | ORAL | Status: AC
  Administered 2019-02-02: 60 mg via ORAL
  Filled 2019-02-02: qty 3

## 2019-02-02 MED ORDER — IPRATROPIUM-ALBUTEROL 20-100 MCG/ACT IN AERS
1.0000 | INHALATION_SPRAY | Freq: Four times a day (QID) | RESPIRATORY_TRACT | Status: DC
  Administered 2019-02-02: 1 via RESPIRATORY_TRACT
  Filled 2019-02-02: qty 4

## 2019-02-02 MED ORDER — MONTELUKAST SODIUM 10 MG PO TABS: 10.0000 mg | ORAL_TABLET | Freq: Every day | ORAL | 1 refills | Status: DC

## 2019-02-02 MED ORDER — ALBUTEROL SULFATE HFA 108 (90 BASE) MCG/ACT IN AERS
8.0000 | INHALATION_SPRAY | Freq: Once | RESPIRATORY_TRACT | Status: AC
  Administered 2019-02-02: 11:00:00 8 via RESPIRATORY_TRACT
  Filled 2019-02-02: qty 6.7

## 2019-02-02 MED ORDER — AEROCHAMBER PLUS FLO-VU LARGE MISC
1.0000 | Freq: Once | Status: AC
  Administered 2019-02-02: 1

## 2019-02-02 NOTE — ED Triage Notes (Signed)
Patient with ongoing congestion and using inhaler with minimal relief. Speaking with complete sentences, seen on 14th for same and states that she thinks she needs another steroid shot

## 2019-02-02 NOTE — Discharge Instructions (Signed)
Use inhalers as prescribed as needed. Singulair was refilled and [facility]. Return to ER as needed for worsening or concerning symptoms otherwise follow-up with your primary care provider.

## 2019-02-02 NOTE — ED Provider Notes (Signed)
[place] EMERGENCY DEPARTMENT Provider Note   CSN: [id number] Arrival date & time: 02/02/19  7829     History   Chief Complaint No chief complaint on file.   HPI Wendy Morgan is a 43 y.o. female.     43 year old female with past medical history of asthma presents with complaint of wheezing, nonproductive cough, shortness of breath.  Patient was last seen in the ER 4 days ago, given albuterol inhaler and injection of Decadron.  Patient has continued to use her albuterol inhaler with a spacer at home however feels like her steroid shot has worn off and the albuterol inhaler alone is not helping with her asthma right now.  Patient states she has a prescription for a steroid inhaler to pick up at [facility] tomorrow.  Patient denies fevers, chills, sick contacts or other sick symptoms today.Wendy Morgan  No previous admissions to the hospital related to her asthma, never intubated.  Patient is a non-smoker, is not exposed to cigarette smoke or harsh chemicals at work.  No other complaints or concerns.     Past Medical History:  Diagnosis Date  . Asthma   . Bronchiectasis with acute exacerbation [facility])     Patient Active Problem List   Diagnosis Date Noted  . Asthma exacerbation 05/29/2018    Past Surgical History:  Procedure Laterality Date  . TUBAL LIGATION       OB History   No obstetric history on file.      Home Medications    Prior to Admission medications   Medication Sig Start Date End Date Taking? Authorizing Provider  albuterol (VENTOLIN HFA) 108 (90 Base) MCG/ACT inhaler Inhale 1-2 puffs into the lungs every 4 (four) hours as needed for wheezing or shortness of breath. 08/05/18   Gildardo Pounds, NP  budesonide-formoterol (SYMBICORT) 80-4.5 MCG/ACT inhaler Inhale 2 puffs into the lungs 2 (two) times daily. 06/26/18   Gildardo Pounds, NP  cetirizine (ZYRTEC) 10 MG tablet Take 1 tablet (10 mg total) by mouth daily. 08/05/18   Gildardo Pounds, NP  Fluticasone-Salmeterol (ADVAIR DISKUS) 250-50 MCG/DOSE AEPB Inhale 1 puff into the lungs 2 (two) times daily. 01/29/19   Gildardo Pounds, NP  montelukast (SINGULAIR) 10 MG tablet Take 1 tablet (10 mg total) by mouth at bedtime. 02/02/19 05/03/19  Tacy Learn, PA-C  predniSONE (DELTASONE) 10 MG tablet Take 5 tablets (50 mg total) by mouth daily for 4 days. 02/02/19 02/06/19  Tacy Learn, PA-C    Family History Family History  Problem Relation Age of Onset  . Kidney disease Neg Hx   . Anxiety disorder Neg Hx   . Depression Neg Hx     Social History Social History   Tobacco Use  . Smoking status: Former Smoker    Quit date: 1998    Years since quitting: 22.8  . Smokeless tobacco: Never Used  Substance Use Topics  . Alcohol use: No  . Drug use: No     Allergies   Patient has no known allergies.   Review of Systems Review of Systems  Constitutional: Negative for fever.  HENT: Negative for congestion, rhinorrhea, sinus pressure, sinus pain and sore throat.   Respiratory: Positive for cough, shortness of breath and wheezing.   Gastrointestinal: Negative for nausea and vomiting.  Musculoskeletal: Negative for arthralgias and myalgias.  Skin: Negative for rash and wound.  Neurological: Negative for weakness.  Hematological: Negative for adenopathy.  Psychiatric/Behavioral: Negative for  confusion.  All other systems reviewed and are negative.    Physical Exam Updated Vital Signs BP 130/81 (BP Location: Left Arm)   Pulse 86   Temp 97.7 F (36.5 C) (Oral)   Resp 14   Ht 5\' 4"  (1.626 m)   Wt 68 kg   SpO2 99%   BMI 25.75 kg/m   Physical Exam Vitals signs and nursing note reviewed.  Constitutional:      General: She is not in acute distress.    Appearance: She is well-developed. She is not diaphoretic.  HENT:     Head: Normocephalic and atraumatic.  Cardiovascular:     Rate and Rhythm: Normal rate and regular rhythm.     Pulses: Normal  pulses.     Heart sounds: Normal heart sounds.  Pulmonary:     Effort: Pulmonary effort is normal.     Breath sounds: Wheezing present.  Musculoskeletal:     Right lower leg: No edema.     Left lower leg: No edema.  Skin:    General: Skin is warm and dry.     Findings: No rash.  Neurological:     Mental Status: She is alert and oriented to person, place, and time.  Psychiatric:        Behavior: Behavior normal.      ED Treatments / Results  Labs (all labs ordered are listed, but only abnormal results are displayed) Labs Reviewed - No data to display  EKG None  Radiology No results found.  Procedures Procedures (including critical care time)  Medications Ordered in ED Medications  Ipratropium-Albuterol (COMBIVENT) respimat 1 puff (1 puff Inhalation Given 02/02/19 1153)  predniSONE (DELTASONE) tablet 60 mg (60 mg Oral Given 02/02/19 1055)  albuterol (VENTOLIN HFA) 108 (90 Base) MCG/ACT inhaler 8 puff (8 puffs Inhalation Given 02/02/19 1056)  AeroChamber Plus Flo-Vu Large MISC 1 each (1 each Other Given 02/02/19 1056)     Initial Impression / Assessment and Plan / ED Course  I have reviewed the triage vital signs and the nursing notes.  Pertinent labs & imaging results that were available during my care of the patient were reviewed by me and considered in my medical decision making (see chart for details).  Clinical Course as of Feb 02 1156  Sun Feb 02, 2019  [id number] 43 year old female with history of asthma presents with complaint of asthma exacerbation.  On exam patient is well-appearing, has moderate wheezing with prolonged expiratory state.  Patient is afebrile, no other sick symptoms.  Patient was last in the ER 4 days ago for same, given injection of Decadron and feels as if her steroid has worn off.  Patient is out of her Singulair and her steroid inhaler. She was given dose of p.o. prednisone with nebulizer equivalent albuterol MDI dosing and dose of Combivent.   Patient feels like she has improved and is ready for discharge.  Plan is to discharge on short course of prednisone, Singulair refilled, follow-up with PCP and use inhalers as prescribed.   [LM]    Clinical Course User Index [LM] Jeannie FendMurphy, Yanina Knupp A, PA-C      Final Clinical Impressions(s) / ED Diagnoses   Final diagnoses:  Moderate persistent asthma with exacerbation    ED Discharge Orders         Ordered    montelukast (SINGULAIR) 10 MG tablet  Daily at bedtime     02/02/19 1154    predniSONE (DELTASONE) 10 MG tablet  Daily  02/02/19 1156           Jeannie Fend, PA-C 02/02/19 1157    Jacalyn Lefevre, MD 02/02/19 1234

## 2019-02-09 ENCOUNTER — Other Ambulatory Visit: Payer: Self-pay

## 2019-02-09 DIAGNOSIS — J4541 Moderate persistent asthma with (acute) exacerbation: Secondary | ICD-10-CM | POA: Insufficient documentation

## 2019-02-09 DIAGNOSIS — Z87891 Personal history of nicotine dependence: Secondary | ICD-10-CM | POA: Insufficient documentation

## 2019-02-09 DIAGNOSIS — Z79899 Other long term (current) drug therapy: Secondary | ICD-10-CM | POA: Insufficient documentation

## 2019-02-09 DIAGNOSIS — Z20828 Contact with and (suspected) exposure to other viral communicable diseases: Secondary | ICD-10-CM | POA: Insufficient documentation

## 2019-02-10 ENCOUNTER — Emergency Department (HOSPITAL_COMMUNITY)
Admission: EM | Admit: 2019-02-10 | Discharge: 2019-02-10 | Disposition: A | Discharge status: Home / Self Care | Payer: Self-pay | Attending: Emergency Medicine | Admitting: Emergency Medicine

## 2019-02-10 ENCOUNTER — Emergency Department (HOSPITAL_COMMUNITY): Payer: Self-pay

## 2019-02-10 ENCOUNTER — Encounter (HOSPITAL_COMMUNITY): Payer: Self-pay | Admitting: Emergency Medicine

## 2019-02-10 ENCOUNTER — Other Ambulatory Visit: Payer: Self-pay

## 2019-02-10 DIAGNOSIS — J4541 Moderate persistent asthma with (acute) exacerbation: Secondary | ICD-10-CM

## 2019-02-10 LAB — CBC WITH DIFFERENTIAL/PLATELET
Abs Immature Granulocytes: 0.19 10*3/uL — ABNORMAL HIGH (ref 0.00–0.07)
Basophils Absolute: 0.1 10*3/uL (ref 0.0–0.1)
Basophils Relative: 1 %
Eosinophils Absolute: 1.3 10*3/uL — ABNORMAL HIGH (ref 0.0–0.5)
Eosinophils Relative: 7 %
HCT: 44.1 % (ref 36.0–46.0)
Hemoglobin: 14.5 g/dL (ref 12.0–15.0)
Immature Granulocytes: 1 %
Lymphocytes Relative: 16 %
Lymphs Abs: 2.8 10*3/uL (ref 0.7–4.0)
MCH: 29.7 pg (ref 26.0–34.0)
MCHC: 32.9 g/dL (ref 30.0–36.0)
MCV: 90.4 fL (ref 80.0–100.0)
Monocytes Absolute: 0.9 10*3/uL (ref 0.1–1.0)
Monocytes Relative: 5 %
Neutro Abs: 12.5 10*3/uL — ABNORMAL HIGH (ref 1.7–7.7)
Neutrophils Relative %: 70 %
Platelets: 344 10*3/uL (ref 150–400)
RBC: 4.88 MIL/uL (ref 3.87–5.11)
RDW: 14.1 % (ref 11.5–15.5)
WBC: 17.8 10*3/uL — ABNORMAL HIGH (ref 4.0–10.5)
nRBC: 0 % (ref 0.0–0.2)

## 2019-02-10 LAB — BASIC METABOLIC PANEL
Anion gap: 8 (ref 5–15)
BUN: 15 mg/dL (ref 6–20)
CO2: 23 mmol/L (ref 22–32)
Calcium: 8.9 mg/dL (ref 8.9–10.3)
Chloride: 107 mmol/L (ref 98–111)
Creatinine, Ser: 0.83 mg/dL (ref 0.44–1.00)
GFR calc Af Amer: >60 mL/min (ref >60)
GFR calc non Af Amer: >60 mL/min (ref >60)
Glucose, Bld: 103 mg/dL — ABNORMAL HIGH (ref 70–99)
Potassium: 4 mmol/L (ref 3.5–5.1)
Sodium: 138 mmol/L (ref 135–145)

## 2019-02-10 LAB — SARS CORONAVIRUS 2 (TAT 6-24 HRS): SARS Coronavirus 2: NEGATIVE

## 2019-02-10 LAB — BRAIN NATRIURETIC PEPTIDE: B Natriuretic Peptide: 17.7 pg/mL (ref 0.0–100.0)

## 2019-02-10 MED ORDER — IPRATROPIUM BROMIDE HFA 17 MCG/ACT IN AERS
8.0000 | INHALATION_SPRAY | Freq: Once | RESPIRATORY_TRACT | Status: AC
  Administered 2019-02-10: 8 via RESPIRATORY_TRACT
  Filled 2019-02-10: qty 12.9

## 2019-02-10 MED ORDER — PREDNISONE 20 MG PO TABS: ORAL_TABLET | ORAL | 0 refills | Status: DC

## 2019-02-10 MED ORDER — AZITHROMYCIN 250 MG PO TABS: ORAL_TABLET | ORAL | 0 refills | Status: DC

## 2019-02-10 MED ORDER — ALBUTEROL SULFATE HFA 108 (90 BASE) MCG/ACT IN AERS
10.0000 | INHALATION_SPRAY | Freq: Once | RESPIRATORY_TRACT | Status: AC
  Administered 2019-02-10: 10 via RESPIRATORY_TRACT

## 2019-02-10 MED ORDER — DEXAMETHASONE SODIUM PHOSPHATE 10 MG/ML IJ SOLN
10.0000 mg | Freq: Once | INTRAMUSCULAR | Status: AC
  Administered 2019-02-10: 10 mg via INTRAMUSCULAR
  Filled 2019-02-10: qty 1

## 2019-02-10 MED ORDER — AZITHROMYCIN 250 MG PO TABS: 250.0000 mg | ORAL_TABLET | Freq: Every day | ORAL | 0 refills | Status: DC

## 2019-02-10 MED ORDER — AZITHROMYCIN 250 MG PO TABS
500.0000 mg | ORAL_TABLET | Freq: Once | ORAL | Status: AC
  Administered 2019-02-10: 500 mg via ORAL
  Filled 2019-02-10: qty 2

## 2019-02-10 MED ORDER — MAGNESIUM SULFATE 2 GM/50ML IV SOLN
2.0000 g | Freq: Once | INTRAVENOUS | Status: AC
  Administered 2019-02-10: 2 g via INTRAVENOUS
  Filled 2019-02-10: qty 50

## 2019-02-10 MED ORDER — ALBUTEROL SULFATE HFA 108 (90 BASE) MCG/ACT IN AERS: 10.0000 | INHALATION_SPRAY | Freq: Once | RESPIRATORY_TRACT | Status: DC

## 2019-02-10 MED ORDER — ALBUTEROL SULFATE HFA 108 (90 BASE) MCG/ACT IN AERS
10.0000 | INHALATION_SPRAY | Freq: Once | RESPIRATORY_TRACT | Status: AC
  Administered 2019-02-10: 10 via RESPIRATORY_TRACT
  Filled 2019-02-10: qty 6.7

## 2019-02-10 MED ORDER — ALBUTEROL SULFATE HFA 108 (90 BASE) MCG/ACT IN AERS
12.0000 | INHALATION_SPRAY | Freq: Once | RESPIRATORY_TRACT | Status: AC
  Administered 2019-02-10: 12 via RESPIRATORY_TRACT

## 2019-02-10 MED FILL — AZITHROMYCIN 250 MG TABLET: 250 | 5 days supply | Qty: 6 | Fill #0

## 2019-02-10 NOTE — ED Provider Notes (Signed)
[facility]ERGENC[facility] Provider Note   CSN: [id number] Arrival date & time: 02/09/19  2342     History   Chief Complaint Chief Complaint  Patient presents with  . Asthma    HPI Wendy Morgan is a 43 y.o. female.     Patient with significant asthma history of for years presents with worsening cough congestion and wheezing for the past few days.  Patient felt he got worse once she ran out of steroids from the last time she was seen.  This feels similar to her asthma exacerbations in the past.  No fever or sick contacts.  No known Covid contacts known.  Patient has no cardiac or blood clot history.     Past Medical History:  Diagnosis Date  . Asthma   . Bronchiectasis with acute exacerbation [facility])     Patient Active Problem List   Diagnosis Date Noted  . Asthma exacerbation 05/29/2018    Past Surgical History:  Procedure Laterality Date  . TUBAL LIGATION       OB History   No obstetric history on file.      Home Medications    Prior to Admission medications   Medication Sig Start Date End Date Taking? Authorizing Provider  albuterol (VENTOLIN HFA) 108 (90 Base) MCG/ACT inhaler Inhale 1-2 puffs into the lungs every 4 (four) hours as needed for wheezing or shortness of breath. 08/05/18   Claiborne RiggFleming, Zelda W, NP  budesonide-formoterol (SYMBICORT) 80-4.5 MCG/ACT inhaler Inhale 2 puffs into the lungs 2 (two) times daily. 06/26/18   Claiborne RiggFleming, Zelda W, NP  cetirizine (ZYRTEC) 10 MG tablet Take 1 tablet (10 mg total) by mouth daily. 08/05/18   Claiborne RiggFleming, Zelda W, NP  Fluticasone-Salmeterol (ADVAIR DISKUS) 250-50 MCG/DOSE AEPB Inhale 1 puff into the lungs 2 (two) times daily. 01/29/19   Claiborne RiggFleming, Zelda W, NP  montelukast (SINGULAIR) 10 MG tablet Take 1 tablet (10 mg total) by mouth at bedtime. 02/02/19 05/03/19  Jeannie FendMurphy, Laura A, PA-C  predniSONE (DELTASONE) 20 MG tablet 3 tabs po day one, then 2 po daily x 4 days 02/10/19   Blane OharaZavitz, Ellouise Mcwhirter, MD     Family History Family History  Problem Relation Age of Onset  . Kidney disease Neg Hx   . Anxiety disorder Neg Hx   . Depression Neg Hx     Social History Social History   Tobacco Use  . Smoking status: Former Smoker    Quit date: 1998    Years since quitting: 22.8  . Smokeless tobacco: Never Used  Substance Use Topics  . Alcohol use: No  . Drug use: No     Allergies   Patient has no known allergies.   Review of Systems Review of Systems  Constitutional: Negative for chills and fever.  HENT: Positive for congestion.   Respiratory: Positive for cough and shortness of breath.   Cardiovascular: Negative for chest pain.  Gastrointestinal: Negative for abdominal pain and vomiting.  Musculoskeletal: Negative for back pain, neck pain and neck stiffness.  Skin: Negative for rash.  Neurological: Negative for light-headedness and headaches.     Physical Exam Updated Vital Signs BP 110/82   Pulse 77   Temp 98.2 F (36.8 C) (Temporal)   Resp 18   LMP 01/16/2019   SpO2 96%   Physical Exam Vitals signs and nursing note reviewed.  Constitutional:      Appearance: She is well-developed.  HENT:     Head: Normocephalic and atraumatic.  Eyes:  General:        Right eye: No discharge.        Left eye: No discharge.     Conjunctiva/sclera: Conjunctivae normal.  Neck:     Musculoskeletal: Normal range of motion and neck supple.     Trachea: No tracheal deviation.  Cardiovascular:     Rate and Rhythm: Normal rate and regular rhythm.  Pulmonary:     Effort: Pulmonary effort is normal.     Breath sounds: Wheezing and rhonchi present.  Abdominal:     General: There is no distension.     Palpations: Abdomen is soft.     Tenderness: There is no abdominal tenderness. There is no guarding.  Skin:    General: Skin is warm.     Findings: No rash.  Neurological:     Mental Status: She is alert and oriented to person, place, and time.      ED Treatments / Results   Labs (all labs ordered are listed, but only abnormal results are displayed) Labs Reviewed  SARS CORONAVIRUS 2 (TAT 6-24 HRS)  BASIC METABOLIC PANEL  CBC WITH DIFFERENTIAL/PLATELET  BRAIN NATRIURETIC PEPTIDE    EKG None  Radiology Dg Chest Portable 1 View  Result Date: 02/10/2019 CLINICAL DATA:  Cough EXAM: PORTABLE CHEST 1 VIEW COMPARISON:  Twelve days ago FINDINGS: Normal heart size and mediastinal contours. No acute infiltrate or edema. No effusion or pneumothorax. No acute osseous findings. IMPRESSION: Negative chest. Electronically Signed   By: Marnee Spring M.D.   On: 02/10/2019 05:23    Procedures Procedures (including critical care time)  Medications Ordered in ED Medications  albuterol (VENTOLIN HFA) 108 (90 Base) MCG/ACT inhaler 10 puff (has no administration in time range)  dexamethasone (DECADRON) injection 10 mg (10 mg Intramuscular Given 02/10/19 0501)  albuterol (VENTOLIN HFA) 108 (90 Base) MCG/ACT inhaler 10 puff (10 puffs Inhalation Given 02/10/19 0501)  albuterol (VENTOLIN HFA) 108 (90 Base) MCG/ACT inhaler 10 puff (10 puffs Inhalation Given 02/10/19 0607)  albuterol (VENTOLIN HFA) 108 (90 Base) MCG/ACT inhaler 12 puff (12 puffs Inhalation Given 02/10/19 0623)  albuterol (VENTOLIN HFA) 108 (90 Base) MCG/ACT inhaler 10 puff (10 puffs Inhalation Given 02/10/19 0651)     Initial Impression / Assessment and Plan / ED Course  I have reviewed the triage vital signs and the nursing notes.  Pertinent labs & imaging results that were available during my care of the patient were reviewed by me and considered in my medical decision making (see chart for details).       Patient presents with clinical concern for asthma exacerbation/bronchitis.  With multiple visits to the emergency room chest x-ray obtained to look for occult pneumonia or other pathology.  Albuterol inhaler multiple dosing given in the emergency room.  Covid test obtained and sent for outpatient  follow-up.  Chest x-ray reviewed no acute infiltrate or fluid.  Vital signs within normal limits with mild decrease oxygen saturation 96%.  Patient received 2 rounds of 10 puffs albuterol with minimal improvement.  Patient currently taking third round of 10 puffs of albuterol.  Patient received steroids.  Covid test sent pending.  Patient CARE signed out to physician assistant to continue to monitor and admit if no improvement.  Basic blood work sent.  With rales on exam concern for atypical pneumonia.  Azithromycin will be given.   Wendy Morgan was evaluated in Emergency Department on 02/10/2019 for the symptoms described in the history of present illness. She was evaluated in  the context of the global COVID-19 pandemic, which necessitated consideration that the patient might be at risk for infection with the SARS-CoV-2 virus that causes COVID-19. Institutional protocols and algorithms that pertain to the evaluation of patients at risk for COVID-19 are in a state of rapid change based on information released by regulatory bodies including the CDC and federal and state organizations. These policies and algorithms were followed during the patient's care in the ED.   Final Clinical Impressions(s) / ED Diagnoses   Final diagnoses:  Moderate persistent asthma with acute exacerbation    ED Discharge Orders         Ordered    azithromycin (ZITHROMAX Z-PAK) 250 MG tablet  Status:  Discontinued     02/10/19 0503    predniSONE (DELTASONE) 20 MG tablet     02/10/19 0503    azithromycin (ZITHROMAX Z-PAK) 250 MG tablet  Status:  Discontinued     02/10/19 0503           Elnora Morrison, MD 02/10/19 [id number]

## 2019-02-10 NOTE — ED Notes (Signed)
ED Provider at bedside. 

## 2019-02-10 NOTE — Discharge Instructions (Addendum)
Use albuterol inhaler every 2-3 hours as needed.  If you develop worsening breathing difficulty, passout or new concerns come the emergency room. The steroid shot you were given in the emergency room should last approximately 48 hours.  You can start taking oral steroids and antibiotics tomorrow.  Continue to isolate from others until you have your Covid test results which will take approximate 24 hours, good hand hygiene, mask wearing. Take antibiotics as prescribed in case atypical pneumonia.

## 2019-02-10 NOTE — ED Provider Notes (Signed)
Care assumed from Dr. Reather Converse at shift change, please see their notes for full documentation of patient's complaint/HPI. Briefly, pt here with cough, wheezing, and chest tightness intermittently for 2 weeks. Seen in the ED on  10/15 and 10/18 for same and discharged home. Given an Rx prednisone during 2nd visit which she states helped her symptoms for a couple of days but then returned. Results so far show normal CXR. Awaiting basic bloodwork. Pt has already received multiple rounds of albuterol inhaler as well as atrovent and azithromycin with questionable concern for atypical PNA given rales on exam. Plan is to monitor patient and decide on disposition.   Physical Exam  BP 110/82   Pulse 77   Temp 98.2 F (36.8 C) (Temporal)   Resp 18   LMP 01/16/2019   SpO2 96%   Physical Exam Vitals signs and nursing note reviewed.  Constitutional:      Appearance: She is not ill-appearing.  HENT:     Head: Normocephalic and atraumatic.  Eyes:     Conjunctiva/sclera: Conjunctivae normal.  Cardiovascular:     Rate and Rhythm: Normal rate and regular rhythm.  Pulmonary:     Effort: Pulmonary effort is normal.     Breath sounds: Wheezing present.  Skin:    General: Skin is warm and dry.     Coloration: Skin is not jaundiced.  Neurological:     Mental Status: She is alert.     ED Course/Procedures     Procedures  MDM  Prior to atrovent treatment pt continues to have scattered wheezes throughout all lung fields. Will continue to monitor after atrovent and labwork. Have added on magnesium as well.   Leukocytosis present at 17,800; suspect this is elevated due to recent prednisone use. Already received first dose of azithromycin in the ED.   On reevaluation patient states she is feeling much improved after magnesium and Atrovent.  She continues to have faint wheezing but much improved from earlier. She was ambulated with pulse ox and her oxygen saturation remained at 97%.  She feels that she  wants to go home at this time.  I have discussed risks and benefits of admission versus outpatient follow-up.  Patient states that she feels much improved from earlier in the night.  She is able to speak in full sentences and does not appear to be using accessory muscles. She will return to the ED if she begins to have worsening symptoms.  She will call her PCP and schedule appointment for this week.  Feel this is appropriate.  Will discharge home with prednisone initially prescribed by Dr. Reather Converse as well as with the azithromycin for atypical pneumonia. Pt is in agreement with plan at this time and stable for discharge home. A send out covid test has been sent; pt advised to stay at home and self isolate until she receives results.        Wendy Maize, PA-C 02/10/19 1539    Wendy Rice, MD 03/04/19 [phone]

## 2019-02-10 NOTE — ED Triage Notes (Signed)
C/o asthma flare-up x 2 weeks.  States she continues to wheeze after using inhalers.  Seen in ED on the 14th and 18th for same.  Speaking in complete sentences.

## 2019-02-10 NOTE — ED Notes (Signed)
Pt. alert & interactive during discharge; pt. ambulatory to exit 

## 2019-02-10 NOTE — ED Notes (Signed)
Portable xray at bedside.

## 2019-02-10 NOTE — ED Notes (Signed)
Pt placed on continuous pulse ox

## 2019-02-18 IMAGING — DX DG CHEST 1V PORT
1 series · 1 of 1 positions shown · non-contrast
Comparison: 02/20/2018 chest radiograph.

CLINICAL DATA: 42 y/o F; asthma exacerbation. No relief from
inhalers.

EXAM:
PORTABLE CHEST 1 VIEW

[chest ap]
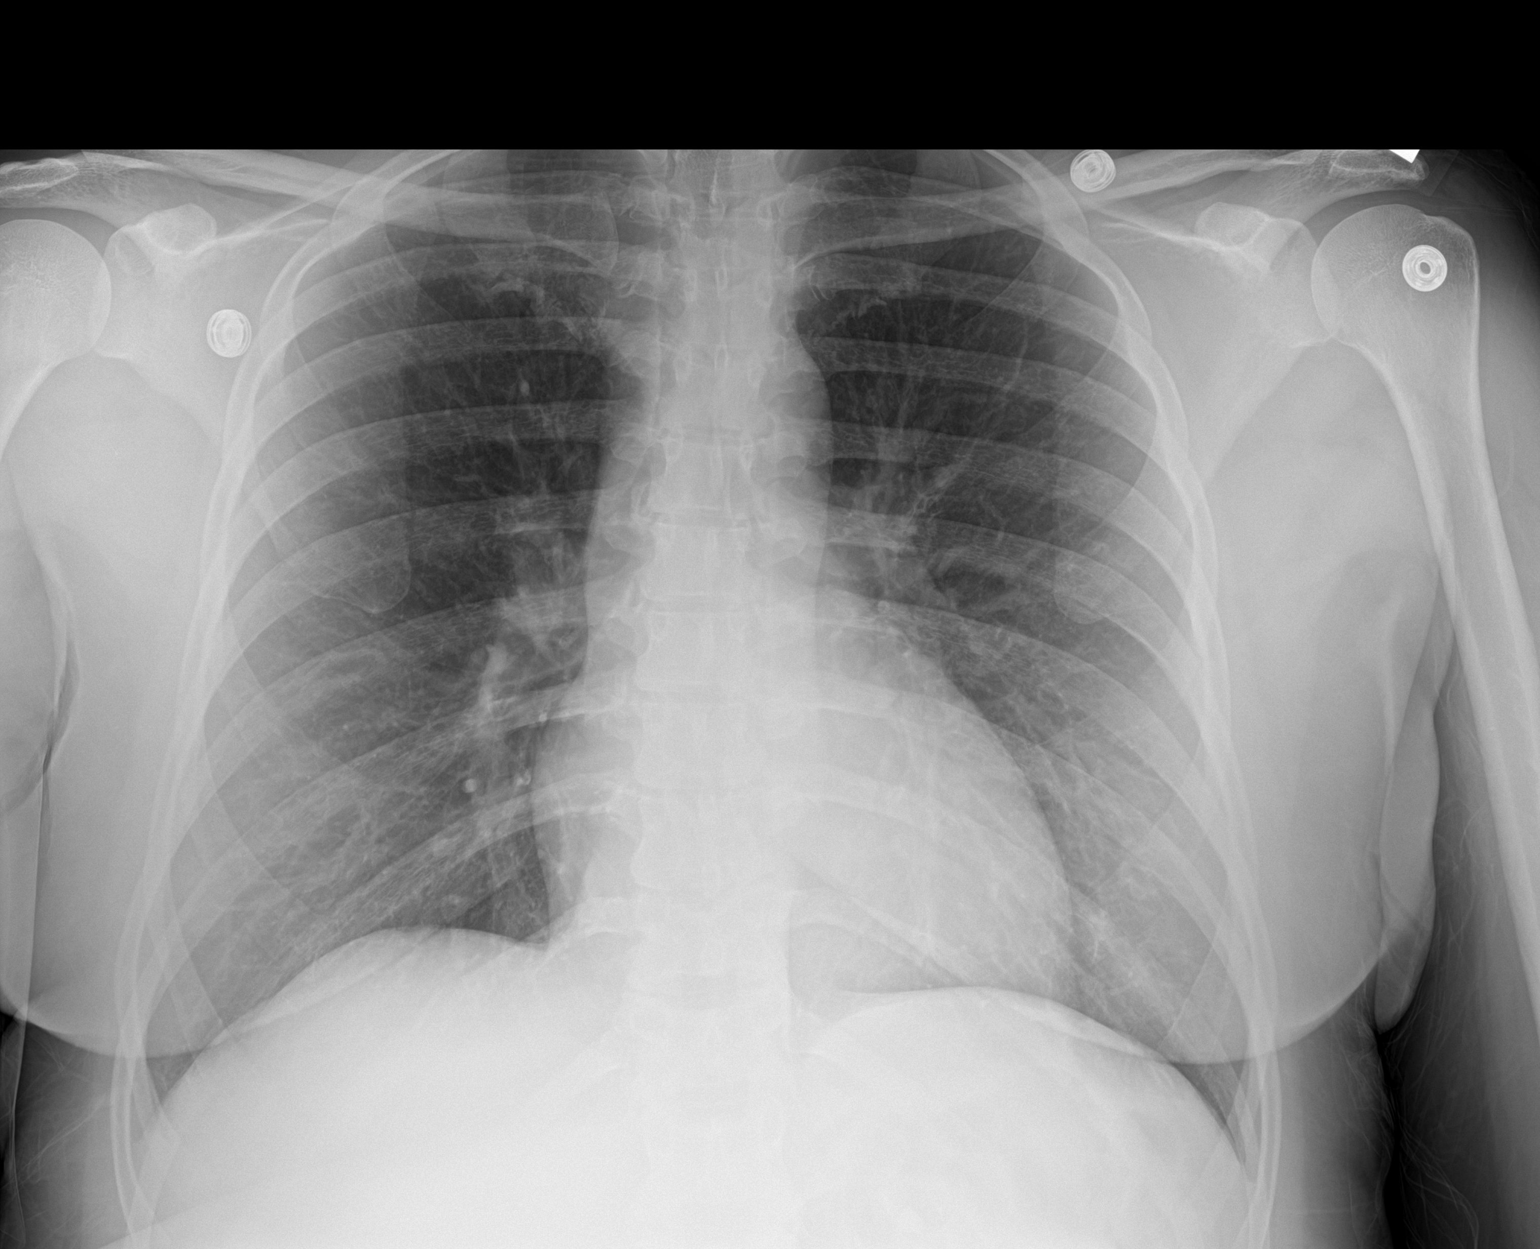

[1 of 1 positions shown; findings below may reference images not displayed]

FINDINGS: Stable heart size and mediastinal contours are within normal limits.
Both lungs are clear. The visualized skeletal structures are
unremarkable.
IMPRESSION: No active disease.

## 2019-02-20 ENCOUNTER — Emergency Department (HOSPITAL_COMMUNITY): Payer: Self-pay

## 2019-02-20 ENCOUNTER — Encounter (HOSPITAL_COMMUNITY): Payer: Self-pay | Admitting: Emergency Medicine

## 2019-02-20 ENCOUNTER — Other Ambulatory Visit: Payer: Self-pay

## 2019-02-20 ENCOUNTER — Emergency Department (HOSPITAL_COMMUNITY)
Admission: EM | Admit: 2019-02-20 | Discharge: 2019-02-21 | Disposition: A | Discharge status: Home / Self Care | Payer: Self-pay | Attending: Emergency Medicine | Admitting: Emergency Medicine

## 2019-02-20 DIAGNOSIS — Z79899 Other long term (current) drug therapy: Secondary | ICD-10-CM | POA: Insufficient documentation

## 2019-02-20 DIAGNOSIS — R0982 Postnasal drip: Secondary | ICD-10-CM | POA: Insufficient documentation

## 2019-02-20 DIAGNOSIS — R0981 Nasal congestion: Secondary | ICD-10-CM | POA: Insufficient documentation

## 2019-02-20 DIAGNOSIS — Z8709 Personal history of other diseases of the respiratory system: Secondary | ICD-10-CM | POA: Insufficient documentation

## 2019-02-20 DIAGNOSIS — Z87891 Personal history of nicotine dependence: Secondary | ICD-10-CM | POA: Insufficient documentation

## 2019-02-20 LAB — BASIC METABOLIC PANEL
Anion gap: 12 (ref 5–15)
BUN: 8 mg/dL (ref 6–20)
CO2: 25 mmol/L (ref 22–32)
Calcium: 8.8 mg/dL — ABNORMAL LOW (ref 8.9–10.3)
Chloride: 101 mmol/L (ref 98–111)
Creatinine, Ser: 0.97 mg/dL (ref 0.44–1.00)
GFR calc Af Amer: >60 mL/min (ref >60)
GFR calc non Af Amer: >60 mL/min (ref >60)
Glucose, Bld: 102 mg/dL — ABNORMAL HIGH (ref 70–99)
Potassium: 3.7 mmol/L (ref 3.5–5.1)
Sodium: 138 mmol/L (ref 135–145)

## 2019-02-20 LAB — CBC WITH DIFFERENTIAL/PLATELET
Abs Immature Granulocytes: 0.09 10*3/uL — ABNORMAL HIGH (ref 0.00–0.07)
Basophils Absolute: 0.1 10*3/uL (ref 0.0–0.1)
Basophils Relative: 0 %
Eosinophils Absolute: 1.3 10*3/uL — ABNORMAL HIGH (ref 0.0–0.5)
Eosinophils Relative: 9 %
HCT: 43.1 % (ref 36.0–46.0)
Hemoglobin: 14.2 g/dL (ref 12.0–15.0)
Immature Granulocytes: 1 %
Lymphocytes Relative: 24 %
Lymphs Abs: 3.6 10*3/uL (ref 0.7–4.0)
MCH: 29.6 pg (ref 26.0–34.0)
MCHC: 32.9 g/dL (ref 30.0–36.0)
MCV: 90 fL (ref 80.0–100.0)
Monocytes Absolute: 1 10*3/uL (ref 0.1–1.0)
Monocytes Relative: 7 %
Neutro Abs: 9.2 10*3/uL — ABNORMAL HIGH (ref 1.7–7.7)
Neutrophils Relative %: 59 %
Platelets: 362 10*3/uL (ref 150–400)
RBC: 4.79 MIL/uL (ref 3.87–5.11)
RDW: 13.8 % (ref 11.5–15.5)
WBC: 15.3 10*3/uL — ABNORMAL HIGH (ref 4.0–10.5)
nRBC: 0 % (ref 0.0–0.2)

## 2019-02-20 LAB — I-STAT BETA HCG BLOOD, ED (MC, WL, AP ONLY): I-stat hCG, quantitative: <5 m[IU]/mL (ref <5)

## 2019-02-20 MED ORDER — ALBUTEROL SULFATE HFA 108 (90 BASE) MCG/ACT IN AERS
2.0000 | INHALATION_SPRAY | Freq: Once | RESPIRATORY_TRACT | Status: AC
  Administered 2019-02-20: 2 via RESPIRATORY_TRACT
  Filled 2019-02-20: qty 6.7

## 2019-02-20 NOTE — ED Triage Notes (Signed)
Patient reports asthma with occasional productive cough/wheezing  for several weeks , denies fever or chills .

## 2019-02-21 ENCOUNTER — Other Ambulatory Visit: Payer: Self-pay

## 2019-02-21 ENCOUNTER — Encounter (HOSPITAL_COMMUNITY): Payer: Self-pay

## 2019-02-21 ENCOUNTER — Emergency Department (HOSPITAL_COMMUNITY)
Admission: EM | Admit: 2019-02-21 | Discharge: 2019-02-21 | Disposition: A | Discharge status: Home / Self Care | Payer: Self-pay | Attending: Emergency Medicine | Admitting: Emergency Medicine

## 2019-02-21 DIAGNOSIS — Z79899 Other long term (current) drug therapy: Secondary | ICD-10-CM | POA: Insufficient documentation

## 2019-02-21 DIAGNOSIS — Z87891 Personal history of nicotine dependence: Secondary | ICD-10-CM | POA: Insufficient documentation

## 2019-02-21 DIAGNOSIS — J45901 Unspecified asthma with (acute) exacerbation: Secondary | ICD-10-CM | POA: Insufficient documentation

## 2019-02-21 MED ORDER — IPRATROPIUM-ALBUTEROL 0.5-2.5 (3) MG/3ML IN SOLN
3.0000 mL | Freq: Three times a day (TID) | RESPIRATORY_TRACT | Status: DC | PRN
  Administered 2019-02-21 (×3): 3 mL via RESPIRATORY_TRACT
  Filled 2019-02-21: qty 6
  Filled 2019-02-21: qty 3

## 2019-02-21 MED ORDER — PREDNISONE 10 MG PO TABS: ORAL_TABLET | ORAL | 0 refills | Status: AC

## 2019-02-21 MED ORDER — PREDNISONE 20 MG PO TABS
60.0000 mg | ORAL_TABLET | Freq: Once | ORAL | Status: AC
  Administered 2019-02-21: 08:00:00 60 mg via ORAL
  Filled 2019-02-21: qty 3

## 2019-02-21 NOTE — ED Provider Notes (Signed)
[facility] DEPT Provider Note   CSN: [id number] Arrival date & time: 02/21/19  2831     History   Chief Complaint Chief Complaint  Patient presents with  . Asthma  . Nasal Congestion    HPI Wendy Morgan is a 43 y.o. female with a history of asthma presenting emergency department with wheezing shortness of breath.  The patient reports Wendy Morgan has been struggling with asthma for the past month.  Wendy Morgan most recently came off of the steroid short course burst 3 days ago, and felt like her asthma immediately rebounded.  Wendy Morgan says the cold weather is huge trigger for her.  Wendy Morgan has been having persistent coughing, worse at night.  Wendy Morgan denies any fevers or chills.  Wendy Morgan states Wendy Morgan has been using her albuterol nebulizer at home but is not helping her symptoms.  Wendy Morgan states the steroids are the only thing that seemed to really help her, sometimes Wendy Morgan needs a longer course, like 10 days instead of 5.  Her next asthma specialty appointment is a month from now in December.  Of note the patient was seen at [facility] earlier this evening for the same symptoms.  Wendy Morgan was diagnosed postnasal drip.  Wendy Morgan had an unremarkable chest x-ray at that time.  Wendy Morgan had a BMP and a CBC was notable for mild leukocytosis consistent with her recent steroids.  Wendy Morgan reports Wendy Morgan was not given any DuoNeb's or steroid prescriptions.  Wendy Morgan left the hospital and then came to [facility] long Wendy Morgan feels like her breathing is still very bad.  Wendy Morgan has no known drug allergies. Wendy Morgan reports no other medical problems.     HPI  Past Medical History:  Diagnosis Date  . Asthma   . Bronchiectasis with acute exacerbation [facility])     Patient Active Problem List   Diagnosis Date Noted  . Asthma exacerbation 05/29/2018    Past Surgical History:  Procedure Laterality Date  . TUBAL LIGATION       OB History   No obstetric history on file.      Home Medications    Prior to Admission medications    Medication Sig Start Date End Date Taking? Authorizing Provider  albuterol (VENTOLIN HFA) 108 (90 Base) MCG/ACT inhaler Inhale 1-2 puffs into the lungs every 4 (four) hours as needed for wheezing or shortness of breath. 08/05/18   Gildardo Pounds, NP  azithromycin (ZITHROMAX) 250 MG tablet Take 1 tablet (250 mg total) by mouth daily. Take first 2 tablets together, then 1 every day until finished. 02/10/19   Alroy Bailiff, Margaux, PA-C  budesonide-formoterol (SYMBICORT) 80-4.5 MCG/ACT inhaler Inhale 2 puffs into the lungs 2 (two) times daily. 06/26/18   Gildardo Pounds, NP  cetirizine (ZYRTEC) 10 MG tablet Take 1 tablet (10 mg total) by mouth daily. 08/05/18   Gildardo Pounds, NP  Fluticasone-Salmeterol (ADVAIR DISKUS) 250-50 MCG/DOSE AEPB Inhale 1 puff into the lungs 2 (two) times daily. 01/29/19   Gildardo Pounds, NP  montelukast (SINGULAIR) 10 MG tablet Take 1 tablet (10 mg total) by mouth at bedtime. 02/02/19 05/03/19  Tacy Learn, PA-C  predniSONE (DELTASONE) 10 MG tablet Take 6 tablets (60 mg total) by mouth daily for 5 days, THEN 4 tablets (40 mg total) daily for 3 days, THEN 2 tablets (20 mg total) daily for 3 days. 02/22/19 03/05/19  Wyvonnia Dusky, MD  predniSONE (DELTASONE) 20 MG tablet 3 tabs po day one, then 2 po daily x 4  days 02/10/19   Blane Ohara, MD    Family History Family History  Problem Relation Age of Onset  . Kidney disease Neg Hx   . Anxiety disorder Neg Hx   . Depression Neg Hx     Social History Social History   Tobacco Use  . Smoking status: Former Smoker    Quit date: 1998    Years since quitting: 22.8  . Smokeless tobacco: Never Used  Substance Use Topics  . Alcohol use: No  . Drug use: No     Allergies   Patient has no known allergies.   Review of Systems Review of Systems  Constitutional: Negative for chills and fever.  Respiratory: Positive for chest tightness and shortness of breath. Negative for cough.   Cardiovascular: Negative for chest  pain and palpitations.  Gastrointestinal: Negative for abdominal pain and vomiting.  Musculoskeletal: Negative for arthralgias and back pain.  Skin: Negative for pallor and rash.  Neurological: Negative for syncope and light-headedness.  All other systems reviewed and are negative.    Physical Exam Updated Vital Signs BP 117/70   Pulse 88   Temp 97.8 F (36.6 C) (Oral)   Resp 18   LMP 02/17/2019   SpO2 100%   Physical Exam Vitals signs and nursing note reviewed.  Constitutional:      General: Wendy Morgan is not in acute distress.    Appearance: Wendy Morgan is well-developed.  HENT:     Head: Normocephalic and atraumatic.  Neck:     Musculoskeletal: Neck supple.  Cardiovascular:     Rate and Rhythm: Normal rate.     Pulses: Normal pulses.  Pulmonary:     Effort: Pulmonary effort is normal. No accessory muscle usage or respiratory distress.     Comments: Diffuse inspiratory and expiratory bilateral wheezing Speaking in full sentences 98% on room air Abdominal:     General: There is no distension.     Tenderness: There is no abdominal tenderness.  Skin:    General: Skin is warm and dry.  Neurological:     Mental Status: Wendy Morgan is alert.      ED Treatments / Results  Labs (all labs ordered are listed, but only abnormal results are displayed) Labs Reviewed - No data to display  EKG EKG Interpretation  Date/Time:  Friday February 21 2019 07:33:47 EST Ventricular Rate:  75 PR Interval:    QRS Duration: 93 QT Interval:  400 QTC Calculation: 447 R Axis:   42 Text Interpretation: Sinus rhythm No STEMI Confirmed by Alvester Chou [phone]) on 02/21/2019 7:50:34 AM   Radiology Dg Chest 2 View  Result Date: 02/20/2019 CLINICAL DATA:  Asthma cough EXAM: CHEST - 2 VIEW COMPARISON:  02/10/2019 FINDINGS: The heart size and mediastinal contours are within normal limits. Both lungs are clear. The visualized skeletal structures are unremarkable. IMPRESSION: No active cardiopulmonary  disease. Electronically Signed   By: Jasmine Pang M.D.   On: 02/20/2019 20:53    Procedures Procedures (including critical care time)  Medications Ordered in ED Medications  predniSONE (DELTASONE) tablet 60 mg (60 mg Oral Given 02/21/19 1601)     Initial Impression / Assessment and Plan / ED Course  I have reviewed the triage vital signs and the nursing notes.  Pertinent labs & imaging results that were available during my care of the patient were reviewed by me and considered in my medical decision making (see chart for details).   44 year old female presenting to emergency department with asthma exacerbation.  Wendy Morgan  has diffuse inspiratory and expiratory wheezing on exam.  [facility] showed clear lung fields.  Wendy Morgan had a mild leukocytosis in her earlier labs, consistent with her recent steroid taper.  Believe Wendy Morgan is having an asthma rebound exacerbation in the setting of finishing her recent steroid course.  Wendy Morgan may need a longer course of steroids.  I would like to give her some breathing treatments while Wendy Morgan is in the emergency department.  I will need to speak to her our respiratory team about our policy regarding breathing treatments in the Covid area.  I have a very low suspicion this patient is Covid positive given her negative x-ray and her afebrile status. Clinical Course as of Feb 20 1505  [facility]  [id number] Improvement of wheezing but still wheezing on exam.  After 3 duonebs.  Wendy Morgan feels better.  I advised that Wendy Morgan give herself albuterol nebulizers every 3-4 hours at home for the next 2 days, and will prescirbe a 10 day course of steroids for this asthma exacerbation   [MT]    Clinical Course User Index [MT] Loranzo Desha, Kermit BaloMatthew J, MD    Final Clinical Impressions(s) / ED Diagnoses   Final diagnoses:  Exacerbation of asthma, unspecified asthma severity, unspecified whether persistent    ED Discharge Orders         Ordered    predniSONE  (DELTASONE) 10 MG tablet     02/21/19 0949           Terald Sleeperrifan, Vannak Montenegro J, MD 02/21/19 1506

## 2019-02-21 NOTE — ED Notes (Signed)
Pt has completed neb treatments. MD aware

## 2019-02-21 NOTE — ED Notes (Signed)
EDP at bedside  

## 2019-02-21 NOTE — ED Provider Notes (Signed)
[facility] EMERGENCY DEPARTMENT Provider Note  CSN: [id number] Arrival date & time: 02/20/19 1942  Chief Complaint(s) Asthma  HPI Wendy Morgan is a 43 y.o. female with a history of asthma who presents to the emergency department with reported wheezing for several weeks with associated productive cough. Worse with lying down.  Patient has been using inhalers with minimal relief.  No associated fever or chills.  No chest pain or shortness of breath.  Patient has been seen on several occasions and provided with steroids.  Patient denies any other physical complaints    HPI  Past Medical History Past Medical History:  Diagnosis Date  . Asthma   . Bronchiectasis with acute exacerbation [facility])    Patient Active Problem List   Diagnosis Date Noted  . Asthma exacerbation 05/29/2018   Home Medication(s) Prior to Admission medications   Medication Sig Start Date End Date Taking? Authorizing Provider  albuterol (VENTOLIN HFA) 108 (90 Base) MCG/ACT inhaler Inhale 1-2 puffs into the lungs every 4 (four) hours as needed for wheezing or shortness of breath. 08/05/18   Claiborne Rigg, NP  azithromycin (ZITHROMAX) 250 MG tablet Take 1 tablet (250 mg total) by mouth daily. Take first 2 tablets together, then 1 every day until finished. 02/10/19   Hyman Hopes, Margaux, PA-C  budesonide-formoterol (SYMBICORT) 80-4.5 MCG/ACT inhaler Inhale 2 puffs into the lungs 2 (two) times daily. 06/26/18   Claiborne Rigg, NP  cetirizine (ZYRTEC) 10 MG tablet Take 1 tablet (10 mg total) by mouth daily. 08/05/18   Claiborne Rigg, NP  Fluticasone-Salmeterol (ADVAIR DISKUS) 250-50 MCG/DOSE AEPB Inhale 1 puff into the lungs 2 (two) times daily. 01/29/19   Claiborne Rigg, NP  montelukast (SINGULAIR) 10 MG tablet Take 1 tablet (10 mg total) by mouth at bedtime. 02/02/19 05/03/19  Jeannie Fend, PA-C  predniSONE (DELTASONE) 20 MG tablet 3 tabs po day one, then 2 po daily x 4 days 02/10/19   Blane Ohara, MD                                                                                                                                    Past Surgical History Past Surgical History:  Procedure Laterality Date  . TUBAL LIGATION     Family History Family History  Problem Relation Age of Onset  . Kidney disease Neg Hx   . Anxiety disorder Neg Hx   . Depression Neg Hx     Social History Social History   Tobacco Use  . Smoking status: Former Smoker    Quit date: 1998    Years since quitting: 22.8  . Smokeless tobacco: Never Used  Substance Use Topics  . Alcohol use: No  . Drug use: No   Allergies Patient has no known allergies.  Review of Systems Review of Systems All other systems are reviewed and are negative for acute change except as noted  in the HPI  Physical Exam Vital Signs  I have reviewed the triage vital signs BP 133/78 (BP Location: Left Arm)   Pulse 91   Temp 97.6 F (36.4 C) (Oral)   Resp 17   LMP 02/17/2019   SpO2 99%   Physical Exam Vitals signs reviewed.  Constitutional:      General: She is not in acute distress.    Appearance: She is well-developed. She is not diaphoretic.  HENT:     Head: Normocephalic and atraumatic.     Nose: Nose normal.     Right Turbinates: Enlarged and swollen.     Left Turbinates: Not enlarged or swollen.     Comments: Post nasal drip Eyes:     General: No scleral icterus.       Right eye: No discharge.        Left eye: No discharge.     Conjunctiva/sclera: Conjunctivae normal.     Pupils: Pupils are equal, round, and reactive to light.  Neck:     Musculoskeletal: Normal range of motion and neck supple.  Cardiovascular:     Rate and Rhythm: Normal rate and regular rhythm.     Heart sounds: No murmur. No friction rub. No gallop.   Pulmonary:     Effort: Pulmonary effort is normal. No respiratory distress.     Breath sounds: Normal breath sounds. Transmitted upper airway sounds (cleared with coughing)  present. No stridor. No wheezing or rales.  Abdominal:     General: There is no distension.     Palpations: Abdomen is soft.     Tenderness: There is no abdominal tenderness.  Musculoskeletal:        General: No tenderness.  Skin:    General: Skin is warm and dry.     Findings: No erythema or rash.  Neurological:     Mental Status: She is alert and oriented to person, place, and time.     ED Results and Treatments Labs (all labs ordered are listed, but only abnormal results are displayed) Labs Reviewed  CBC WITH DIFFERENTIAL/PLATELET - Abnormal; Notable for the following components:      Result Value   WBC 15.3 (*)    Neutro Abs 9.2 (*)    Eosinophils Absolute 1.3 (*)    Abs Immature Granulocytes 0.09 (*)    All other components within normal limits  BASIC METABOLIC PANEL - Abnormal; Notable for the following components:   Glucose, Bld 102 (*)    Calcium 8.8 (*)    All other components within normal limits  I-STAT BETA HCG BLOOD, ED ([facility], [facility], [facility] ONLY)                                                                                                                         EKG  EKG Interpretation  Date/Time:  Thursday February 20 2019 20:07:25 EST Ventricular Rate:  87 PR Interval:  148 QRS Duration: 82 QT Interval:  372 QTC Calculation: 447  R Axis:   46 Text Interpretation: Normal sinus rhythm Normal ECG Confirmed by Drema Pryardama, [place] [id number]) on 02/21/2019 6:00:46 AM      Radiology Dg Chest 2 View  Result Date: 02/20/2019 CLINICAL DATA:  Asthma cough EXAM: CHEST - 2 VIEW COMPARISON:  02/10/2019 FINDINGS: The heart size and mediastinal contours are within normal limits. Both lungs are clear. The visualized skeletal structures are unremarkable. IMPRESSION: No active cardiopulmonary disease. Electronically Signed   By: Jasmine PangKim  Fujinaga M.D.   On: 02/20/2019 20:53    Pertinent labs & imaging results that were available during my care of the patient were reviewed by me and  considered in my medical decision making (see chart for details).  Medications Ordered in ED Medications  albuterol (VENTOLIN HFA) 108 (90 Base) MCG/ACT inhaler 2 puff (2 puffs Inhalation Given 02/20/19 2019)                                                                                                                                    Procedures Procedures  (including critical care time)  Medical Decision Making / ED Course I have reviewed the nursing notes for this encounter and the patient's prior records (if available in EHR or on provided paperwork).   Wendy Morgan was evaluated in Emergency Department on 02/21/2019 for the symptoms described in the history of present illness. She was evaluated in the context of the global COVID-19 pandemic, which necessitated consideration that the patient might be at risk for infection with the SARS-CoV-2 virus that causes COVID-19. Institutional protocols and algorithms that pertain to the evaluation of patients at risk for COVID-19 are in a state of rapid change based on information released by regulatory bodies including the CDC and federal and state organizations. These policies and algorithms were followed during the patient's care in the ED.  Nasal congestion and postnasal drip. CXR negative. Respiratory sounds are related to nasal congestion and not pulmonary wheezing. Recommended OTC allergy medication.  The patient appears reasonably screened and/or stabilized for discharge and I doubt any other medical condition or other [facility] requiring further screening, evaluation, or treatment in the ED at this time prior to discharge.  The patient is safe for discharge with strict return precautions.       Final Clinical Impression(s) / ED Diagnoses Final diagnoses:  Nasal congestion  Post-nasal drainage    The patient appears reasonably screened and/or stabilized for discharge and I doubt any other medical condition or other [facility] requiring  further screening, evaluation, or treatment in the ED at this time prior to discharge.  Disposition: Discharge  Condition: Good  I have discussed the results, Dx and Tx plan with the patient who expressed understanding and agree(s) with the plan. Discharge instructions discussed at great length. The patient was given strict return precautions who verbalized understanding of the instructions. No further questions at time of discharge.    ED Discharge Orders  None        Follow Up: Gildardo Pounds, NP [address] [place] [address] [phone]  Schedule an appointment as soon as possible for a visit  As needed      This chart was dictated using voice recognition software.  Despite best efforts to proofread,  errors can occur which can change the documentation meaning.   Fatima Blank, MD 02/21/19 [phone]

## 2019-02-21 NOTE — ED Triage Notes (Addendum)
Patient c/o asthma exacerbation.  Patient reports going to [facility] this morning and was told she had nasal drip. Patient states she was not given any prescriptions and came to [facility].    Asthma flare up X3 weeks that has gotten worse the past 2 days.   C/o shob with exertion and cough  Denies chest pain, fever, or chills.  Hx. Asthma   A/Ox4 Ambulatory to ED room.    Patient reports having EKG, blood work, X-ray this morning.

## 2019-02-21 NOTE — ED Notes (Signed)
Pt discharged from ED; instructions provided; Pt encouraged to return to ED if symptoms worsen and to f/u with PCP; Pt verbalized understanding of all instructions 

## 2019-02-21 NOTE — Discharge Instructions (Signed)
I recommended that you give yourself albuterol nebulizer treatments every 3-4 hours for the next 2 days.  You should also begin taking your steroids tomorrow as prescribed.  You should schedule a follow-up appoint with your primary care doctor in 3 days to have your lungs reassessed.

## 2019-03-11 ENCOUNTER — Other Ambulatory Visit: Payer: Self-pay

## 2019-03-11 ENCOUNTER — Encounter (HOSPITAL_COMMUNITY): Payer: Self-pay | Admitting: Emergency Medicine

## 2019-03-11 ENCOUNTER — Emergency Department (HOSPITAL_COMMUNITY)
Admission: EM | Admit: 2019-03-11 | Discharge: 2019-03-12 | Disposition: A | Discharge status: Home / Self Care | Payer: Self-pay | Attending: Emergency Medicine | Admitting: Emergency Medicine

## 2019-03-11 DIAGNOSIS — Z87891 Personal history of nicotine dependence: Secondary | ICD-10-CM | POA: Insufficient documentation

## 2019-03-11 DIAGNOSIS — J4541 Moderate persistent asthma with (acute) exacerbation: Secondary | ICD-10-CM | POA: Insufficient documentation

## 2019-03-11 DIAGNOSIS — Z79899 Other long term (current) drug therapy: Secondary | ICD-10-CM | POA: Insufficient documentation

## 2019-03-11 MED ORDER — ALBUTEROL SULFATE HFA 108 (90 BASE) MCG/ACT IN AERS
2.0000 | INHALATION_SPRAY | Freq: Once | RESPIRATORY_TRACT | Status: AC
  Administered 2019-03-11: 22:00:00 2 via RESPIRATORY_TRACT
  Filled 2019-03-11: qty 6.7

## 2019-03-11 NOTE — ED Triage Notes (Signed)
Patient reports asthma attack this evening unrelieved by inhaler , occasional productive cough , no fever or chills .

## 2019-03-12 MED ORDER — PREDNISONE 20 MG PO TABS
60.0000 mg | ORAL_TABLET | Freq: Once | ORAL | Status: AC
  Administered 2019-03-12: 01:00:00 60 mg via ORAL
  Filled 2019-03-12: qty 3

## 2019-03-12 MED ORDER — IPRATROPIUM BROMIDE HFA 17 MCG/ACT IN AERS
2.0000 | INHALATION_SPRAY | Freq: Once | RESPIRATORY_TRACT | Status: AC
  Administered 2019-03-12: 01:00:00 2 via RESPIRATORY_TRACT
  Filled 2019-03-12: qty 12.9

## 2019-03-12 MED ORDER — PREDNISONE 20 MG PO TABS: ORAL_TABLET | ORAL | 0 refills | Status: DC

## 2019-03-12 MED ORDER — ALBUTEROL SULFATE HFA 108 (90 BASE) MCG/ACT IN AERS
8.0000 | INHALATION_SPRAY | Freq: Once | RESPIRATORY_TRACT | Status: AC
  Administered 2019-03-12: 01:00:00 8 via RESPIRATORY_TRACT
  Filled 2019-03-12: qty 6.7

## 2019-03-12 NOTE — ED Provider Notes (Signed)
[facility] EMERGENCY DEPARTMENT Provider Note   CSN: [id number] Arrival date & time: 03/11/19  2107     History   Chief Complaint Chief Complaint  Patient presents with  . Asthma    HPI Wendy Morgan is a 43 y.o. female.     Patient with a history of asthma presents with uncontrolled wheezing x 2 days. She is using her Advair and albuterol inhalers without significant relief. No chest pain, cough, fever, congestion or sore throat. She is a nonsmoker. No sick contacts.   The history is provided by the patient. No language interpreter was used.  Asthma Associated symptoms include shortness of breath. Pertinent negatives include no chest pain.    Past Medical History:  Diagnosis Date  . Asthma   . Bronchiectasis with acute exacerbation [facility])     Patient Active Problem List   Diagnosis Date Noted  . Asthma exacerbation 05/29/2018    Past Surgical History:  Procedure Laterality Date  . TUBAL LIGATION       OB History   No obstetric history on file.      Home Medications    Prior to Admission medications   Medication Sig Start Date End Date Taking? Authorizing Provider  albuterol (VENTOLIN HFA) 108 (90 Base) MCG/ACT inhaler Inhale 1-2 puffs into the lungs every 4 (four) hours as needed for wheezing or shortness of breath. 08/05/18   Claiborne Rigg, NP  azithromycin (ZITHROMAX) 250 MG tablet Take 1 tablet (250 mg total) by mouth daily. Take first 2 tablets together, then 1 every day until finished. 02/10/19   Hyman Hopes, Margaux, PA-C  budesonide-formoterol (SYMBICORT) 80-4.5 MCG/ACT inhaler Inhale 2 puffs into the lungs 2 (two) times daily. 06/26/18   Claiborne Rigg, NP  cetirizine (ZYRTEC) 10 MG tablet Take 1 tablet (10 mg total) by mouth daily. 08/05/18   Claiborne Rigg, NP  Fluticasone-Salmeterol (ADVAIR DISKUS) 250-50 MCG/DOSE AEPB Inhale 1 puff into the lungs 2 (two) times daily. 01/29/19   Claiborne Rigg, NP  montelukast (SINGULAIR) 10  MG tablet Take 1 tablet (10 mg total) by mouth at bedtime. 02/02/19 05/03/19  Jeannie Fend, PA-C  predniSONE (DELTASONE) 20 MG tablet 3 tabs po day one, then 2 po daily x 4 days 02/10/19   Blane Ohara, MD    Family History Family History  Problem Relation Age of Onset  . Kidney disease Neg Hx   . Anxiety disorder Neg Hx   . Depression Neg Hx     Social History Social History   Tobacco Use  . Smoking status: Former Smoker    Quit date: 1998    Years since quitting: 22.9  . Smokeless tobacco: Never Used  Substance Use Topics  . Alcohol use: No  . Drug use: No     Allergies   Patient has no known allergies.   Review of Systems Review of Systems  Constitutional: Negative for chills and fever.  HENT: Negative.  Negative for congestion and sore throat.   Respiratory: Positive for shortness of breath and wheezing.   Cardiovascular: Negative.  Negative for chest pain.  Gastrointestinal: Negative.  Negative for vomiting.  Musculoskeletal: Negative.  Negative for myalgias.  Neurological: Negative.      Physical Exam Updated Vital Signs BP 117/84 (BP Location: Right Arm)   Pulse 89   Temp 98 F (36.7 C) (Oral)   Resp 18   LMP 02/17/2019   SpO2 96%   Physical Exam Vitals signs and nursing  note reviewed.  Constitutional:      Appearance: She is well-developed.  HENT:     Head: Normocephalic.     Mouth/Throat:     Mouth: Mucous membranes are moist.  Neck:     Musculoskeletal: Normal range of motion and neck supple.  Cardiovascular:     Rate and Rhythm: Normal rate and regular rhythm.     Heart sounds: No murmur.  Pulmonary:     Effort: Pulmonary effort is normal.     Breath sounds: Wheezing (Bilateral expiratory wheezes) present.  Abdominal:     General: Bowel sounds are normal.     Palpations: Abdomen is soft.     Tenderness: There is no abdominal tenderness. There is no guarding or rebound.  Musculoskeletal: Normal range of motion.  Skin:    General:  Skin is warm and dry.     Findings: No rash.  Neurological:     Mental Status: She is alert and oriented to person, place, and time.      ED Treatments / Results  Labs (all labs ordered are listed, but only abnormal results are displayed) Labs Reviewed - No data to display  EKG None  Radiology No results found.  Procedures Procedures (including critical care time)  Medications Ordered in ED Medications  albuterol (VENTOLIN HFA) 108 (90 Base) MCG/ACT inhaler 2 puff (2 puffs Inhalation Given 03/11/19 2146)     Initial Impression / Assessment and Plan / ED Course  I have reviewed the triage vital signs and the nursing notes.  Pertinent labs & imaging results that were available during my care of the patient were reviewed by me and considered in my medical decision making (see chart for details).        Patient to ED with wheezing that is uncontrolled with her usual asthma treatments at home. No fever, chest pain, congestion. No sick contacts.   She has bilateral expiratory wheezes throughout on exam. No hypoxia. No respiratory distress. She has an infrequent dry cough.   Albuterol, 8 puffs, Atrovent, 2 puffs, and prednisone 60 mg provided with improvement. The patient reports feeling significantly better, "90%". She states she is comfortable with discharge home, which is felt appropriate.   Strict return precautions discussed. She will plan to follow up with PCP.   Final Clinical Impressions(s) / ED Diagnoses   Final diagnoses:  None   1. asthma exacerbation  ED Discharge Orders    None       Charlann Lange, PA-C 03/12/19 0256    Fatima Blank, MD 03/12/19 [phone]

## 2019-03-12 NOTE — Discharge Instructions (Addendum)
Please follow up with your primary care doctor for recheck in 3-4 days.   Return here if you experience worsening shortness of breath, uncontrolled wheezing, fever, cough or new concern.

## 2019-03-16 ENCOUNTER — Encounter (HOSPITAL_COMMUNITY): Payer: Self-pay | Admitting: Emergency Medicine

## 2019-03-16 ENCOUNTER — Emergency Department (HOSPITAL_COMMUNITY)
Admission: EM | Admit: 2019-03-16 | Discharge: 2019-03-16 | Disposition: A | Discharge status: Home / Self Care | Payer: Self-pay | Attending: Emergency Medicine | Admitting: Emergency Medicine

## 2019-03-16 DIAGNOSIS — Z79899 Other long term (current) drug therapy: Secondary | ICD-10-CM | POA: Insufficient documentation

## 2019-03-16 DIAGNOSIS — J4530 Mild persistent asthma, uncomplicated: Secondary | ICD-10-CM

## 2019-03-16 DIAGNOSIS — Z87891 Personal history of nicotine dependence: Secondary | ICD-10-CM | POA: Insufficient documentation

## 2019-03-16 MED ORDER — AEROCHAMBER PLUS FLO-VU LARGE MISC
Status: AC
  Administered 2019-03-16: 12:00:00
  Filled 2019-03-16: qty 1

## 2019-03-16 MED ORDER — ALBUTEROL SULFATE HFA 108 (90 BASE) MCG/ACT IN AERS
8.0000 | INHALATION_SPRAY | Freq: Once | RESPIRATORY_TRACT | Status: AC
  Administered 2019-03-16: 8 via RESPIRATORY_TRACT
  Filled 2019-03-16: qty 6.7

## 2019-03-16 MED ORDER — PREDNISONE 20 MG PO TABS
60.0000 mg | ORAL_TABLET | Freq: Once | ORAL | Status: AC
  Administered 2019-03-16: 60 mg via ORAL
  Filled 2019-03-16: qty 3

## 2019-03-16 NOTE — ED Triage Notes (Signed)
  Pt here for recurrent asthma. Seen here 11/24 for same. State she has been using all 3 inhalers with no relief. She has a productive cough. She has audible wheezing. 96% on room air. Not currently on any steroids.

## 2019-03-16 NOTE — ED Provider Notes (Signed)
[place] EMERGENCY DEPARTMENT Provider Note   CSN: [id number] Arrival date & time: 03/16/19  1009     History   Chief Complaint No chief complaint on file.   HPI Wendy Morgan is a 43 y.o. female.     The history is provided by the patient. No language interpreter was used.  Shortness of Breath Severity:  Moderate Onset quality:  Gradual Timing:  Constant Progression:  Worsening Chronicity:  New Relieved by:  Nothing Worsened by:  Nothing Ineffective treatments:  None tried Associated symptoms: cough   Risk factors: no tobacco use   Pt complains of asthma exacerbation. Pt is suppose to be on prednisone but she could not get filled.  Pt reports she can get tomorrow at wellness tomorrow  Past Medical History:  Diagnosis Date  . Asthma   . Bronchiectasis with acute exacerbation [facility])     Patient Active Problem List   Diagnosis Date Noted  . Asthma exacerbation 05/29/2018    Past Surgical History:  Procedure Laterality Date  . TUBAL LIGATION       OB History   No obstetric history on file.      Home Medications    Prior to Admission medications   Medication Sig Start Date End Date Taking? Authorizing Provider  albuterol (VENTOLIN HFA) 108 (90 Base) MCG/ACT inhaler Inhale 1-2 puffs into the lungs every 4 (four) hours as needed for wheezing or shortness of breath. 08/05/18   Gildardo Pounds, NP  azithromycin (ZITHROMAX) 250 MG tablet Take 1 tablet (250 mg total) by mouth daily. Take first 2 tablets together, then 1 every day until finished. 02/10/19   Alroy Bailiff, Margaux, PA-C  budesonide-formoterol (SYMBICORT) 80-4.5 MCG/ACT inhaler Inhale 2 puffs into the lungs 2 (two) times daily. 06/26/18   Gildardo Pounds, NP  cetirizine (ZYRTEC) 10 MG tablet Take 1 tablet (10 mg total) by mouth daily. 08/05/18   Gildardo Pounds, NP  Fluticasone-Salmeterol (ADVAIR DISKUS) 250-50 MCG/DOSE AEPB Inhale 1 puff into the lungs 2 (two) times daily. 01/29/19    Gildardo Pounds, NP  montelukast (SINGULAIR) 10 MG tablet Take 1 tablet (10 mg total) by mouth at bedtime. 02/02/19 05/03/19  Tacy Learn, PA-C  predniSONE (DELTASONE) 20 MG tablet 3 tabs po day one, then 2 po daily x 4 days 03/12/19   Charlann Lange, PA-C    Family History Family History  Problem Relation Age of Onset  . Kidney disease Neg Hx   . Anxiety disorder Neg Hx   . Depression Neg Hx     Social History Social History   Tobacco Use  . Smoking status: Former Smoker    Quit date: 1998    Years since quitting: 22.9  . Smokeless tobacco: Never Used  Substance Use Topics  . Alcohol use: No  . Drug use: No     Allergies   Patient has no known allergies.   Review of Systems Review of Systems  Respiratory: Positive for cough and shortness of breath.   All other systems reviewed and are negative.    Physical Exam Updated Vital Signs BP 117/68   Pulse 78   Temp 98.6 F (37 C) (Oral)   Resp (!) 24   LMP 02/17/2019   SpO2 96%   Physical Exam Vitals signs and nursing note reviewed.  Constitutional:      Appearance: She is well-developed.  HENT:     Head: Normocephalic.     Nose: Nose normal.  Mouth/Throat:     Mouth: Mucous membranes are moist.  Eyes:     Pupils: Pupils are equal, round, and reactive to light.  Neck:     Musculoskeletal: Normal range of motion.  Cardiovascular:     Rate and Rhythm: Normal rate.     Pulses: Normal pulses.  Pulmonary:     Effort: Pulmonary effort is normal.     Breath sounds: Wheezing present.  Abdominal:     General: There is no distension.  Musculoskeletal: Normal range of motion.  Skin:    General: Skin is warm.  Neurological:     General: No focal deficit present.     Mental Status: She is alert and oriented to person, place, and time.  Psychiatric:        Mood and Affect: Mood normal.      ED Treatments / Results  Labs (all labs ordered are listed, but only abnormal results are displayed) Labs  Reviewed - No data to display  EKG None  Radiology No results found.  Procedures Procedures (including critical care time)  Medications Ordered in ED Medications  albuterol (VENTOLIN HFA) 108 (90 Base) MCG/ACT inhaler 8 puff (has no administration in time range)  predniSONE (DELTASONE) tablet 60 mg (has no administration in time range)     Initial Impression / Assessment and Plan / ED Course  I have reviewed the triage vital signs and the nursing notes.  Pertinent labs & imaging results that were available during my care of the patient were reviewed by me and considered in my medical decision making (see chart for details).        MDM  Pt given albuterol 8 puffs and prednisone 60 mg.  Pt will get prednisone rx in am.  Pt feels better after medicaiton  Final Clinical Impressions(s) / ED Diagnoses   Final diagnoses:  Mild persistent asthma without complication    ED Discharge Orders    None    An After Visit Summary was printed and given to the patient.    Elson Areas, [place] 03/16/19 1153    Gerhard Munch, MD 03/16/19 1243

## 2019-03-16 NOTE — Discharge Instructions (Addendum)
Return if any problems.

## 2019-03-24 ENCOUNTER — Ambulatory Visit: Payer: Self-pay | Attending: Nurse Practitioner | Admitting: Nurse Practitioner

## 2019-03-24 ENCOUNTER — Encounter: Payer: Self-pay | Admitting: Nurse Practitioner

## 2019-03-24 DIAGNOSIS — J45998 Other asthma: Secondary | ICD-10-CM

## 2019-03-24 DIAGNOSIS — Z09 Encounter for follow-up examination after completed treatment for conditions other than malignant neoplasm: Secondary | ICD-10-CM

## 2019-03-24 NOTE — Progress Notes (Signed)
Virtual Visit via Telephone Note Due to national recommendations of social distancing due to [place] 19, telehealth visit is felt to be most appropriate for this patient at this time.  I discussed the limitations, risks, security and privacy concerns of performing an evaluation and management service by telephone and the availability of in person appointments. I also discussed with the patient that there may be a patient responsible charge related to this service. The patient expressed understanding and agreed to proceed.    I connected with Brigitte Pulse on 03/24/19  at   9:10 AM EST  EDT by telephone and verified that I am speaking with the correct person using two identifiers.   Consent I discussed the limitations, risks, security and privacy concerns of performing an evaluation and management service by telephone and the availability of in person appointments. I also discussed with the patient that there may be a patient responsible charge related to this service. The patient expressed understanding and agreed to proceed.   Location of Patient: Private  Residence   Location of Provider: [place] and [employer] Office    Persons participating in Telemedicine visit: Geryl Rankins FNP-BC Emily    History of Present Illness: Telemedicine visit for: Asthma  She has not had a visit with me since April 2020. Since then she has had 7 ED visits of asthma exacerbation. Today she states her asthma is well controlled. Her last ED visit for Asthma was 03-16-2019. We discussed her frequent use of the emergency room for asthma symptoms and she states it is not because of poor adherence or running out of her medications but mostly related to the work environment and being around smoke and fumes. However per the ED note on 11-29: patient states she could not get the prednisone filled that had been prescribed for her after a previous ED visit on 03-12-2019.   She  currently endorses medication compliance taking Symbicort 80-4.5 mcg 2 puffs 2 times a day, Advair 250-51 puff 2 times a day, montelukast 10 mg at bedtime and albuterol inhaler as needed.  Denies any cough, chest pain, shortness of breath or wheezing. She quit smoking over 20 years ago.     Past Medical History:  Diagnosis Date  . Asthma   . Bronchiectasis with acute exacerbation [facility])     Past Surgical History:  Procedure Laterality Date  . TUBAL LIGATION      Family History  Problem Relation Age of Onset  . Kidney disease Neg Hx   . Anxiety disorder Neg Hx   . Depression Neg Hx     Social History   Socioeconomic History  . Marital status: Single    Spouse name: Not on file  . Number of children: Not on file  . Years of education: Not on file  . Highest education level: Not on file  Occupational History  . Not on file  Social Needs  . Financial resource strain: Not on file  . Food insecurity    Worry: Not on file    Inability: Not on file  . Transportation needs    Medical: Not on file    Non-medical: Not on file  Tobacco Use  . Smoking status: Former Smoker    Quit date: 1998    Years since quitting: 22.9  . Smokeless tobacco: Never Used  Substance and Sexual Activity  . Alcohol use: No  . Drug use: No  . Sexual activity: Yes  Birth control/protection: None  Lifestyle  . Physical activity    Days per week: Not on file    Minutes per session: Not on file  . Stress: Not on file  Relationships  . Social Musician on phone: Not on file    Gets together: Not on file    Attends religious service: Not on file    Active member of club or organization: Not on file    Attends meetings of clubs or organizations: Not on file    Relationship status: Not on file  Other Topics Concern  . Not on file  Social History Narrative  . Not on file     Observations/Objective: Awake, alert and oriented x 3   Review of Systems  Constitutional: Negative for  fever, malaise/fatigue and weight loss.  HENT: Negative.  Negative for nosebleeds.   Eyes: Negative.  Negative for blurred vision, double vision and photophobia.  Respiratory: Negative.  Negative for cough and shortness of breath.   Cardiovascular: Negative.  Negative for chest pain, palpitations and leg swelling.  Gastrointestinal: Negative.  Negative for heartburn, nausea and vomiting.  Musculoskeletal: Negative.  Negative for myalgias.  Neurological: Negative.  Negative for dizziness, focal weakness, seizures and headaches.  Psychiatric/Behavioral: Negative.  Negative for suicidal ideas.    Assessment and Plan: Wendy Morgan was seen today for follow-up.  Diagnoses and all orders for this visit:  Hospital discharge follow-up STABLE  Poorly controlled persistent asthma At this time will not refer to Dr. Delford Field Pulmonology. Misuse of the ED is not the associated with her medication regimen but adherence.   Follow Up Instructions Return in about 2 months (around 05/25/2019).     I discussed the assessment and treatment plan with the patient. The patient was provided an opportunity to ask questions and all were answered. The patient agreed with the plan and demonstrated an understanding of the instructions.   The patient was advised to call back or seek an in-person evaluation if the symptoms worsen or if the condition fails to improve as anticipated.  I provided 15 minutes of non-face-to-face time during this encounter including median intraservice time, reviewing previous notes, labs, imaging, medications and explaining diagnosis and management.  Claiborne Rigg, FNP-BC

## 2019-04-05 ENCOUNTER — Other Ambulatory Visit: Payer: Self-pay

## 2019-04-05 ENCOUNTER — Emergency Department (HOSPITAL_COMMUNITY): Payer: Self-pay

## 2019-04-05 ENCOUNTER — Encounter (HOSPITAL_COMMUNITY): Payer: Self-pay

## 2019-04-05 ENCOUNTER — Emergency Department (HOSPITAL_COMMUNITY)
Admission: EM | Admit: 2019-04-05 | Discharge: 2019-04-05 | Disposition: A | Discharge status: Home / Self Care | Payer: Self-pay | Attending: Emergency Medicine | Admitting: Emergency Medicine

## 2019-04-05 DIAGNOSIS — J4541 Moderate persistent asthma with (acute) exacerbation: Secondary | ICD-10-CM | POA: Insufficient documentation

## 2019-04-05 DIAGNOSIS — Z87891 Personal history of nicotine dependence: Secondary | ICD-10-CM | POA: Insufficient documentation

## 2019-04-05 DIAGNOSIS — Z20828 Contact with and (suspected) exposure to other viral communicable diseases: Secondary | ICD-10-CM | POA: Insufficient documentation

## 2019-04-05 LAB — POC SARS CORONAVIRUS 2 AG -  ED: SARS Coronavirus 2 Ag: NEGATIVE

## 2019-04-05 MED ORDER — PREDNISONE 20 MG PO TABS: ORAL_TABLET | ORAL | 0 refills | Status: DC

## 2019-04-05 MED ORDER — IPRATROPIUM BROMIDE HFA 17 MCG/ACT IN AERS
2.0000 | INHALATION_SPRAY | Freq: Once | RESPIRATORY_TRACT | Status: AC
  Administered 2019-04-05: 2 via RESPIRATORY_TRACT
  Filled 2019-04-05: qty 12.9

## 2019-04-05 MED ORDER — IPRATROPIUM BROMIDE 0.02 % IN SOLN
1.5000 mg | Freq: Once | RESPIRATORY_TRACT | Status: AC
  Administered 2019-04-05: 1.5 mg via RESPIRATORY_TRACT
  Filled 2019-04-05: qty 7.5

## 2019-04-05 MED ORDER — IPRATROPIUM-ALBUTEROL 0.5-2.5 (3) MG/3ML IN SOLN: 6.0000 mL | Freq: Once | RESPIRATORY_TRACT | Status: DC

## 2019-04-05 MED ORDER — ALBUTEROL SULFATE HFA 108 (90 BASE) MCG/ACT IN AERS
8.0000 | INHALATION_SPRAY | Freq: Once | RESPIRATORY_TRACT | Status: AC
  Administered 2019-04-05: 8 via RESPIRATORY_TRACT
  Filled 2019-04-05: qty 6.7

## 2019-04-05 MED ORDER — ALBUTEROL SULFATE HFA 108 (90 BASE) MCG/ACT IN AERS
2.0000 | INHALATION_SPRAY | Freq: Once | RESPIRATORY_TRACT | Status: AC
  Administered 2019-04-05: 2 via RESPIRATORY_TRACT

## 2019-04-05 MED ORDER — ALBUTEROL (5 MG/ML) CONTINUOUS INHALATION SOLN
10.0000 mg/h | INHALATION_SOLUTION | RESPIRATORY_TRACT | Status: DC
  Administered 2019-04-05: 17:00:00 10 mg/h via RESPIRATORY_TRACT
  Filled 2019-04-05: qty 20

## 2019-04-05 MED ORDER — ALBUTEROL SULFATE HFA 108 (90 BASE) MCG/ACT IN AERS
2.0000 | INHALATION_SPRAY | RESPIRATORY_TRACT | Status: DC | PRN
  Administered 2019-04-05: 2 via RESPIRATORY_TRACT

## 2019-04-05 MED ORDER — PREDNISONE 20 MG PO TABS
60.0000 mg | ORAL_TABLET | Freq: Once | ORAL | Status: AC
  Administered 2019-04-05: 60 mg via ORAL
  Filled 2019-04-05: qty 3

## 2019-04-05 MED ORDER — FLUTICASONE-SALMETEROL 250-50 MCG/DOSE IN AEPB: 1.0000 | INHALATION_SPRAY | Freq: Two times a day (BID) | RESPIRATORY_TRACT | 3 refills | Status: DC

## 2019-04-05 NOTE — ED Triage Notes (Signed)
Pt reports starting to feel [facility] and wheezy yesterday and used inhalers without any relief. Pt has hx of asthma. Pt unaware of anything that exacerbated asthma.

## 2019-04-05 NOTE — ED Provider Notes (Signed)
[facility]-EMERGENCY DEPT Provider Note   CSN: [id number] Arrival date & time: 04/05/19  1206     History Chief Complaint  Patient presents with  . Shortness of Breath  . Asthma    Wendy Morgan is a 43 y.o. female.  HPI Patient reports history of frequent asthma exacerbations.  She reports that she works at a [employer] service [employer].  Sometimes her symptoms are triggered by smoke and chemicals.  Most recent symptoms started yesterday evening.  She reports that this morning she was using her albuterol rescue inhaler repeatedly but still feeling short of breath and wheezing.  No fever, she reports she gets a mild cough whenever her asthma flares up but no productive cough and no chest pain.  No lower extremity swelling.  No upper respiratory infectious symptoms.  No fever no chills no myalgia.  Patient reports this is typical for an exacerbation for her.  She estimates last time she had oral steroids was about a month ago.  She reports she uses an Advair inhaler but noticed that it has run out.  She is not sure if she has been using it for a few days and that it already run out.    Past Medical History:  Diagnosis Date  . Asthma   . Bronchiectasis with acute exacerbation [facility])     Patient Active Problem List   Diagnosis Date Noted  . Asthma exacerbation 05/29/2018    Past Surgical History:  Procedure Laterality Date  . TUBAL LIGATION       OB History   No obstetric history on file.     Family History  Problem Relation Age of Onset  . Kidney disease Neg Hx   . Anxiety disorder Neg Hx   . Depression Neg Hx     Social History   Tobacco Use  . Smoking status: Former Smoker    Quit date: 1998    Years since quitting: 22.9  . Smokeless tobacco: Never Used  Substance Use Topics  . Alcohol use: No  . Drug use: No    Home Medications Prior to Admission medications   Medication Sig Start Date End Date Taking? Authorizing Provider  albuterol (VENTOLIN  HFA) 108 (90 Base) MCG/ACT inhaler Inhale 1-2 puffs into the lungs every 4 (four) hours as needed for wheezing or shortness of breath. 08/05/18  Yes Claiborne Rigg, NP  azithromycin (ZITHROMAX) 250 MG tablet Take 1 tablet (250 mg total) by mouth daily. Take first 2 tablets together, then 1 every day until finished. Patient not taking: Reported on 04/05/2019 02/10/19   Tanda Rockers, PA-C  budesonide-formoterol [facility]) 80-4.5 MCG/ACT inhaler Inhale 2 puffs into the lungs 2 (two) times daily. Patient not taking: Reported on 04/05/2019 06/26/18   Claiborne Rigg, NP  cetirizine (ZYRTEC) 10 MG tablet Take 1 tablet (10 mg total) by mouth daily. Patient not taking: Reported on 04/05/2019 08/05/18   Claiborne Rigg, NP  Fluticasone-Salmeterol (ADVAIR DISKUS) 250-50 MCG/DOSE AEPB Inhale 1 puff into the lungs 2 (two) times daily. 04/05/19   Arby Barrette, MD  montelukast (SINGULAIR) 10 MG tablet Take 1 tablet (10 mg total) by mouth at bedtime. Patient not taking: Reported on 04/05/2019 02/02/19 05/03/19  Jeannie Fend, PA-C  predniSONE (DELTASONE) 20 MG tablet 3 tabs po day one, then 2 po daily x 4 days Patient not taking: Reported on 04/05/2019 03/12/19   Elpidio Anis, PA-C  predniSONE (DELTASONE) 20 MG tablet 2 tabs po daily x 3 days  04/05/19   Charlesetta Shanks, MD    Allergies    Patient has no known allergies.  Review of Systems   Review of Systems 10 Systems reviewed and are negative for acute change except as noted in the HPI.  Physical Exam Updated Vital Signs BP (!) 144/94   Pulse 74   Temp 97.7 F (36.5 C) (Oral)   Resp (!) 24   Ht 5\' 4"  (1.626 m)   Wt 81.6 kg   SpO2 97%   BMI 30.90 kg/m   Physical Exam Constitutional:      Comments: Alert, well in appearance.  Mild tachypnea.  Speaking in full sentences without difficulty.  HENT:     Head: Normocephalic and atraumatic.     Mouth/Throat:     Mouth: Mucous membranes are moist.     Pharynx: Oropharynx is clear. No  oropharyngeal exudate or posterior oropharyngeal erythema.  Eyes:     Extraocular Movements: Extraocular movements intact.     Conjunctiva/sclera: Conjunctivae normal.  Cardiovascular:     Rate and Rhythm: Normal rate and regular rhythm.  Pulmonary:     Comments: Mild tachypnea.  Speaking in full sentences without difficulty.  Patient has diffuse wheeze throughout the lung fields.  Adequate airflow to the bases. Abdominal:     General: There is no distension.     Palpations: Abdomen is soft.     Tenderness: There is no abdominal tenderness. There is no guarding.  Musculoskeletal:        General: No swelling or tenderness. Normal range of motion.     Cervical back: Neck supple.  Skin:    General: Skin is warm and dry.  Neurological:     General: No focal deficit present.     Mental Status: She is oriented to person, place, and time.     Coordination: Coordination normal.  Psychiatric:        Mood and Affect: Mood normal.     ED Results / Procedures / Treatments   Labs (all labs ordered are listed, but only abnormal results are displayed) Labs Reviewed  POC SARS CORONAVIRUS 2 AG -  ED    EKG EKG Interpretation  Date/Time:  Saturday April 05 2019 12:17:27 EST Ventricular Rate:  101 PR Interval:    QRS Duration: 99 QT Interval:  358 QTC Calculation: 464 R Axis:   52 Text Interpretation: Sinus tachycardia Right atrial enlargement no change from previous Confirmed by Charlesetta Shanks [id number]) on 04/05/2019 12:22:52 PM   Radiology DG Chest 2 View  Result Date: 04/05/2019 CLINICAL DATA:  Shortness of breath and wheezing. EXAM: CHEST - 2 VIEW COMPARISON:  Chest x-ray dated February 20, 2019. FINDINGS: The heart size and mediastinal contours are within normal limits. Normal pulmonary vascularity. Mild central peribronchial thickening. No focal consolidation, pleural effusion, or pneumothorax. No acute osseous abnormality. IMPRESSION: Central peribronchial thickening,  consistent with reported history of asthma. Electronically Signed   By: Titus Dubin M.D.   On: 04/05/2019 12:46    Procedures Procedures (including critical care time) No crit care Medications Ordered in ED Medications  albuterol (VENTOLIN HFA) 108 (90 Base) MCG/ACT inhaler 2 puff (2 puffs Inhalation Given 04/05/19 1533)  albuterol (PROVENTIL,VENTOLIN) solution continuous neb (10 mg/hr Nebulization New Bag/Given 04/05/19 1638)  ipratropium-albuterol (DUONEB) 0.5-2.5 (3) MG/3ML nebulizer solution 6 mL (0 mLs Nebulization Hold 04/05/19 1638)  predniSONE (DELTASONE) tablet 60 mg (60 mg Oral Given 04/05/19 1254)  albuterol (VENTOLIN HFA) 108 (90 Base) MCG/ACT inhaler 8 puff (8 puffs  Inhalation Given 04/05/19 1255)  ipratropium (ATROVENT HFA) inhaler 2 puff (2 puffs Inhalation Given 04/05/19 1407)  albuterol (VENTOLIN HFA) 108 (90 Base) MCG/ACT inhaler 2 puff (2 puffs Inhalation Given 04/05/19 1407)  ipratropium (ATROVENT) nebulizer solution 1.5 mg (1.5 mg Nebulization Given 04/05/19 1638)    ED Course  I have reviewed the triage vital signs and the nursing notes.  Pertinent labs & imaging results that were available during my care of the patient were reviewed by me and considered in my medical decision making (see chart for details).  Clinical Course as of Apr 04 1799  Sat Apr 05, 2019  1457 Best peak flow 170.  This is after 10 albuterol puffs and 2 Atrovent puffs.  Patient continues to have expiratory wheeze.  Subjectively she feels much improved.  Patient reports she was hospitalized once about 6 months ago for asthma.  She has never been intubated.  He reports if her severity of illness at that time was a 10, today she rates her worst severity at a 7, she feels significantly improved and rates her current severity at a 4-5.  Patient is instructed to take 4 additional puffs from the albuterol inhaler and 2 additional ipratropium.  Will check in approximately 30 to 40 minutes with repeat  peak flow.   [MP]  1757 Patient reports that she feels much better.  She reports feeling back to normal.  She states that there is some residual wheezing resolves over the next couple of days with steroids.  Reports she will be able to be in a non-smoking nonchemical stimulating environment.   [MP]    Clinical Course User Index [MP] Arby BarrettePfeiffer, Jashley Yellin, MD   MDM Rules/Calculators/A&P                      Patient presents with asthma exacerbation.  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  No associated infectious symptoms.  She reports this is a typical type of exacerbation.  She had run out of her Advair Diskus recently.  Patient was treated with multiple treatments in the department.  Once Covid testing was negative, patient was treated with continuous albuterol.  This significantly improved her symptoms.  She feels back to baseline.  Patient will be discharged with 3 additional days of prednisone.  She is instructed to use the albuterol and ipratropium inhalers dispensed from the department.  Patient is supposed to get scheduled to follow-up with pulmonology by her PCP.  Return precautions reviewed. Final Clinical Impression(s) / ED Diagnoses Final diagnoses:  Moderate persistent asthma with exacerbation    Rx / DC Orders ED Discharge Orders         Ordered    predniSONE (DELTASONE) 20 MG tablet     04/05/19 1759    Fluticasone-Salmeterol (ADVAIR DISKUS) 250-50 MCG/DOSE AEPB  2 times daily     04/05/19 1759           Arby BarrettePfeiffer, Haifa Hatton, MD 04/05/19 [id number]

## 2019-04-05 NOTE — Discharge Instructions (Addendum)
1.  Continue the albuterol inhaler 2 puffs every 2-4 hours for the next 2 days.  Use the ipratropium inhaler provided in the emergency department 2 puffs every 4 hours. 2.  Start taking prescribed prednisone tomorrow. 3.  Return to emergency department immediately if your symptoms are worsening. 4.  Avoid any triggers to your asthma.  Avoid any smoke or aerosolized chemicals. 5.  See the pulmonologist for follow-up as soon as possible.

## 2019-04-07 ENCOUNTER — Encounter (HOSPITAL_COMMUNITY): Payer: Self-pay

## 2019-04-07 ENCOUNTER — Ambulatory Visit (HOSPITAL_COMMUNITY)
Admission: EM | Admit: 2019-04-07 | Discharge: 2019-04-07 | Disposition: A | Discharge status: Home / Self Care | Payer: Self-pay | Attending: Family Medicine | Admitting: Family Medicine

## 2019-04-07 ENCOUNTER — Other Ambulatory Visit: Payer: Self-pay

## 2019-04-07 DIAGNOSIS — H1031 Unspecified acute conjunctivitis, right eye: Secondary | ICD-10-CM

## 2019-04-07 DIAGNOSIS — H5711 Ocular pain, right eye: Secondary | ICD-10-CM

## 2019-04-07 MED ORDER — OLOPATADINE HCL 0.2 % OP SOLN: 1.0000 [drp] | Freq: Every day | OPHTHALMIC | 0 refills | Status: DC

## 2019-04-07 NOTE — ED Triage Notes (Signed)
Pt states she has pink eye ( right eye ). Pt states she thinks it coming from a med she's taking. It been like this 2 days.

## 2019-04-09 NOTE — ED Provider Notes (Signed)
[facility]   [id number] 04/07/19 Arrival Time: 1941  ASSESSMENT & PLAN:  1. Acute conjunctivitis of right eye, unspecified acute conjunctivitis type   2. Discomfort of right eye     Trial of: Meds ordered this encounter  Medications  . Olopatadine HCl 0.2 % SOLN    Sig: Apply 1 drop to eye daily.    Dispense:  2.5 mL    Refill:  0    I see no corneal injury/infection grossly or via fluorescein. No signs of iritis. Unable to obtain visual acuity secondary to reported eye discomfort and photosensitivity.  Recommend that she call the office below to arrange ophthalmology evaluation within the next 24 hours given her symptoms.  Follow-up Information    Schedule an appointment as soon as possible for a visit  with Jola Schmidt, MD.   Specialty: Ophthalmology Contact information: [place] [id number] [phone]          ED if abrupt worsening or with any vision loss/vision changes. She agrees.   Reviewed expectations re: course of current medical issues. Questions answered. Outlined signs and symptoms indicating need for more acute intervention. Patient verbalized understanding. After Visit Summary given.   SUBJECTIVE:  Wendy Morgan is a 43 y.o. female who presents with complaint of persistent right eye "irritation"/discomfort with mild itching. Onset gradual, first noted 24-48 hours ago. Has been on a short course of prednisone recently; questions relation. No URI/viral symptoms. Right eye matted in the morning; slight watery discharge. No eye injury. Does not wear contact lenses. Does not feel like she got something in her eye. No specific visual changes except for blurriness with the watery drainage. No vision loss. No headache/n/v. No flashes of light or new floaters reported. Self treatment: none.  ROS: As per HPI. All other systems negative.   OBJECTIVE:  Vitals:   04/07/19 1058 04/07/19 1059  BP:  128/78  Pulse:  66  Resp:   18  Temp:  97.7 F (36.5 C)  SpO2:  98%  Weight: 81.6 kg     General appearance: alert; no distress HEENT: [place]; AT; PERRLA; EOMI without change in reported discomfort OS: normal exam OD: with reported discomfort and difficulty keeping eye open in bright room; better with lights dimmed; with diffuse 1+ conjunctival injection; with watery/clear drainage; without corneal opacities grossly; no corneal defect appreciated with fluorescein exam; without limbal flush; without periorbital swelling or erythema Neck: supple without LAD Lungs: clear to auscultation bilaterally; unlabored respirations Heart: regular rate and rhythm Skin: warm and dry Ext: no edema Neuro: normal gait Psychological: alert and cooperative; normal mood and affect     No Known Allergies  Past Medical History:  Diagnosis Date  . Asthma   . Bronchiectasis with acute exacerbation [facility])    Social History   Socioeconomic History  . Marital status: Single    Spouse name: Not on file  . Number of children: Not on file  . Years of education: Not on file  . Highest education level: Not on file  Occupational History  . Not on file  Tobacco Use  . Smoking status: Former Smoker    Quit date: 1998    Years since quitting: 22.9  . Smokeless tobacco: Never Used  Substance and Sexual Activity  . Alcohol use: No  . Drug use: No  . Sexual activity: Yes    Birth control/protection: None  Other Topics Concern  . Not on file  Social History Narrative  .  Not on file   Social Determinants of Health   Financial Resource Strain:   . Difficulty of Paying Living Expenses: Not on file  Food Insecurity:   . Worried About Programme researcher, broadcasting/film/video in the Last Year: Not on file  . Ran Out of Food in the Last Year: Not on file  Transportation Needs:   . Lack of Transportation (Medical): Not on file  . Lack of Transportation (Non-Medical): Not on file  Physical Activity:   . Days of Exercise per Week: Not on file  . Minutes of  Exercise per Session: Not on file  Stress:   . Feeling of Stress : Not on file  Social Connections:   . Frequency of Communication with Friends and Family: Not on file  . Frequency of Social Gatherings with Friends and Family: Not on file  . Attends Religious Services: Not on file  . Active Member of Clubs or Organizations: Not on file  . Attends Banker Meetings: Not on file  . Marital Status: Not on file  Intimate Partner Violence:   . Fear of Current or Ex-Partner: Not on file  . Emotionally Abused: Not on file  . Physically Abused: Not on file  . Sexually Abused: Not on file   Family History  Problem Relation Age of Onset  . Kidney disease Neg Hx   . Anxiety disorder Neg Hx   . Depression Neg Hx    Past Surgical History:  Procedure Laterality Date  . TUBAL LIGATION       Mardella Layman, MD 04/09/19 [phone]

## 2019-04-11 ENCOUNTER — Emergency Department (HOSPITAL_COMMUNITY)
Admission: EM | Admit: 2019-04-11 | Discharge: 2019-04-12 | Disposition: A | Discharge status: Home / Self Care | Payer: Self-pay | Attending: Emergency Medicine | Admitting: Emergency Medicine

## 2019-04-11 ENCOUNTER — Other Ambulatory Visit: Payer: Self-pay

## 2019-04-11 ENCOUNTER — Encounter (HOSPITAL_COMMUNITY): Payer: Self-pay | Admitting: Pharmacy Technician

## 2019-04-11 ENCOUNTER — Emergency Department (HOSPITAL_COMMUNITY): Payer: Self-pay

## 2019-04-11 DIAGNOSIS — Z87891 Personal history of nicotine dependence: Secondary | ICD-10-CM | POA: Insufficient documentation

## 2019-04-11 DIAGNOSIS — J4541 Moderate persistent asthma with (acute) exacerbation: Secondary | ICD-10-CM | POA: Insufficient documentation

## 2019-04-11 MED ORDER — AEROCHAMBER PLUS FLO-VU LARGE MISC
1.0000 | Freq: Once | Status: AC
  Administered 2019-04-11: 1

## 2019-04-11 MED ORDER — METHYLPREDNISOLONE SODIUM SUCC 125 MG IJ SOLR
125.0000 mg | Freq: Once | INTRAMUSCULAR | Status: AC
  Administered 2019-04-11: 125 mg via INTRAVENOUS
  Filled 2019-04-11: qty 2

## 2019-04-11 MED ORDER — ALBUTEROL SULFATE HFA 108 (90 BASE) MCG/ACT IN AERS
8.0000 | INHALATION_SPRAY | Freq: Once | RESPIRATORY_TRACT | Status: AC
  Administered 2019-04-11: 8 via RESPIRATORY_TRACT
  Filled 2019-04-11: qty 6.7

## 2019-04-11 MED ORDER — PREDNISONE 20 MG PO TABS: 40.0000 mg | ORAL_TABLET | Freq: Every day | ORAL | 0 refills | Status: DC

## 2019-04-11 MED ORDER — ALBUTEROL SULFATE HFA 108 (90 BASE) MCG/ACT IN AERS
2.0000 | INHALATION_SPRAY | Freq: Once | RESPIRATORY_TRACT | Status: AC
  Administered 2019-04-11: 21:00:00 2 via RESPIRATORY_TRACT
  Filled 2019-04-11: qty 6.7

## 2019-04-11 NOTE — ED Triage Notes (Signed)
Pt arrives pov with complaints of asthma exacerbation. Onset yesterday, worsening today. States has been using inhalers without much relief. Denies sick contacts.

## 2019-04-11 NOTE — ED Provider Notes (Signed)
[facility] EMERGENCY DEPARTMENT Provider Note   CSN: [id number] Arrival date & time: 04/11/19  2052     History Chief Complaint  Patient presents with  . Asthma    Wendy Morgan is a 43 y.o. female.  Patient with past medical history notable for asthma presents to the emergency department with a chief complaint of asthma exacerbation.  She states that her symptoms began to worsen today.  She has been using her inhaler.  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  She denies any fevers or chills.  Denies productive cough.  Denies any other associated symptoms.  She has tried using an inhaler in triage as well.  The history is provided by the patient. No language interpreter was used.       Past Medical History:  Diagnosis Date  . Asthma   . Bronchiectasis with acute exacerbation [facility])     Patient Active Problem List   Diagnosis Date Noted  . Asthma exacerbation 05/29/2018    Past Surgical History:  Procedure Laterality Date  . TUBAL LIGATION       OB History   No obstetric history on file.     Family History  Problem Relation Age of Onset  . Kidney disease Neg Hx   . Anxiety disorder Neg Hx   . Depression Neg Hx     Social History   Tobacco Use  . Smoking status: Former Smoker    Quit date: 1998    Years since quitting: 22.9  . Smokeless tobacco: Never Used  Substance Use Topics  . Alcohol use: No  . Drug use: No    Home Medications Prior to Admission medications   Medication Sig Start Date End Date Taking? Authorizing Provider  albuterol (VENTOLIN HFA) 108 (90 Base) MCG/ACT inhaler Inhale 1-2 puffs into the lungs every 4 (four) hours as needed for wheezing or shortness of breath. 08/05/18   Claiborne Rigg, NP  azithromycin (ZITHROMAX) 250 MG tablet Take 1 tablet (250 mg total) by mouth daily. Take first 2 tablets together, then 1 every day until finished. Patient not taking:  Reported on 04/05/2019 02/10/19   Tanda Rockers, PA-C  budesonide-formoterol [facility]) 80-4.5 MCG/ACT inhaler Inhale 2 puffs into the lungs 2 (two) times daily. Patient not taking: Reported on 04/05/2019 06/26/18   Claiborne Rigg, NP  cetirizine (ZYRTEC) 10 MG tablet Take 1 tablet (10 mg total) by mouth daily. Patient not taking: Reported on 04/05/2019 08/05/18   Claiborne Rigg, NP  Fluticasone-Salmeterol (ADVAIR DISKUS) 250-50 MCG/DOSE AEPB Inhale 1 puff into the lungs 2 (two) times daily. 04/05/19   Arby Barrette, MD  montelukast (SINGULAIR) 10 MG tablet Take 1 tablet (10 mg total) by mouth at bedtime. Patient not taking: Reported on 04/05/2019 02/02/19 05/03/19  Army Melia A, PA-C  Olopatadine HCl 0.2 % SOLN Apply 1 drop to eye daily. 04/07/19   Mardella Layman, MD  predniSONE (DELTASONE) 20 MG tablet 3 tabs po day one, then 2 po daily x 4 days Patient not taking: Reported on 04/05/2019 03/12/19   Elpidio Anis, PA-C  predniSONE (DELTASONE) 20 MG tablet 2 tabs po daily x 3 days 04/05/19   Arby Barrette, MD    Allergies    Patient has no known allergies.  Review of Systems   Review of Systems  All other systems reviewed and are negative.   Physical Exam Updated Vital Signs BP (!) 131/53  Pulse 98   Temp 98.5 F (36.9 C) (Oral)   Resp 18   LMP 03/18/2019   SpO2 97%   Physical Exam Vitals and nursing note reviewed.  Constitutional:      General: She is not in acute distress.    Appearance: She is well-developed.  HENT:     Head: Normocephalic and atraumatic.  Eyes:     Conjunctiva/sclera: Conjunctivae normal.  Cardiovascular:     Rate and Rhythm: Normal rate and regular rhythm.     Heart sounds: No murmur.  Pulmonary:     Effort: Pulmonary effort is normal. No respiratory distress.     Breath sounds: Wheezing present.     Comments: Diffuse wheezes bilaterally Abdominal:     Palpations: Abdomen is soft.     Tenderness: There is no abdominal tenderness.    Musculoskeletal:     Cervical back: Neck supple.  Skin:    General: Skin is warm and dry.  Neurological:     Mental Status: She is alert.     ED Results / Procedures / Treatments   Labs (all labs ordered are listed, but only abnormal results are displayed) Labs Reviewed - No data to display  EKG None  Radiology No results found.  Procedures Procedures (including critical care time)  Medications Ordered in ED Medications  methylPREDNISolone sodium succinate (SOLU-MEDROL) 125 mg/2 mL injection 125 mg (has no administration in time range)  albuterol (VENTOLIN HFA) 108 (90 Base) MCG/ACT inhaler 8 puff (has no administration in time range)  AeroChamber Plus Flo-Vu Large MISC 1 each (has no administration in time range)  albuterol (VENTOLIN HFA) 108 (90 Base) MCG/ACT inhaler 2 puff (2 puffs Inhalation Given 04/11/19 2115)    ED Course  I have reviewed the triage vital signs and the nursing notes.  Pertinent labs & imaging results that were available during my care of the patient were reviewed by me and considered in my medical decision making (see chart for details).    MDM Rules/Calculators/A&P                      Patient with asthma exacerbation.  Unfortunately, due to the pandemic, cannot give nebulized albuterol.  Will give 8 puffs of albuterol through an inhaler with a spacer chamber.  Will give 125 mg of IV Solu-Medrol.  Plan to reassess.  11:48 PM, patient is still wheezy after 8 puffs of albuterol.  I offered additional treatment, but patient states that she feels okay with going home.  She states that she will come back if she gets worse.  Will send home with prednisone.  Patient understands and agrees with plan. Final Clinical Impression(s) / ED Diagnoses Final diagnoses:  Moderate persistent asthma with exacerbation    Rx / DC Orders ED Discharge Orders         Ordered    predniSONE (DELTASONE) 20 MG tablet  Daily     04/11/19 2348           Montine Circle, PA-C 04/11/19 2349    Isla Pence, MD 04/11/19 2352

## 2019-04-20 ENCOUNTER — Emergency Department (HOSPITAL_COMMUNITY)
Admission: EM | Admit: 2019-04-20 | Discharge: 2019-04-20 | Disposition: A | Discharge status: Home / Self Care | Payer: Self-pay | Attending: Emergency Medicine | Admitting: Emergency Medicine

## 2019-04-20 ENCOUNTER — Encounter (HOSPITAL_COMMUNITY): Payer: Self-pay | Admitting: Emergency Medicine

## 2019-04-20 ENCOUNTER — Other Ambulatory Visit: Payer: Self-pay

## 2019-04-20 DIAGNOSIS — Z87891 Personal history of nicotine dependence: Secondary | ICD-10-CM | POA: Insufficient documentation

## 2019-04-20 DIAGNOSIS — J4541 Moderate persistent asthma with (acute) exacerbation: Secondary | ICD-10-CM

## 2019-04-20 DIAGNOSIS — J45909 Unspecified asthma, uncomplicated: Secondary | ICD-10-CM

## 2019-04-20 MED ORDER — MONTELUKAST SODIUM 10 MG PO TABS: 10.0000 mg | ORAL_TABLET | Freq: Every day | ORAL | 2 refills | Status: DC

## 2019-04-20 MED ORDER — IPRATROPIUM BROMIDE HFA 17 MCG/ACT IN AERS
2.0000 | INHALATION_SPRAY | Freq: Once | RESPIRATORY_TRACT | Status: AC
  Administered 2019-04-20: 10:00:00 2 via RESPIRATORY_TRACT
  Filled 2019-04-20: qty 12.9

## 2019-04-20 MED ORDER — PREDNISONE 10 MG PO TABS: 20.0000 mg | ORAL_TABLET | Freq: Two times a day (BID) | ORAL | 0 refills | Status: AC

## 2019-04-20 MED ORDER — PREDNISONE 20 MG PO TABS
60.0000 mg | ORAL_TABLET | Freq: Once | ORAL | Status: AC
  Administered 2019-04-20: 10:00:00 60 mg via ORAL
  Filled 2019-04-20: qty 3

## 2019-04-20 MED ORDER — ALBUTEROL SULFATE HFA 108 (90 BASE) MCG/ACT IN AERS
6.0000 | INHALATION_SPRAY | Freq: Once | RESPIRATORY_TRACT | Status: AC
  Administered 2019-04-20: 6 via RESPIRATORY_TRACT
  Filled 2019-04-20: qty 6.7

## 2019-04-20 NOTE — ED Triage Notes (Addendum)
C/o asthma exacerbation since yesterday with wheezing and non-productive cough.  Seen 12/25 for same.  Using inhalers at home without relief.  Used albuterol just PTA.

## 2019-04-20 NOTE — Discharge Instructions (Addendum)
  Hand washing: Wash your hands throughout the day, but especially before and after touching the face, using the restroom, sneezing, coughing, or touching surfaces that have been coughed or sneezed upon. Hydration: Symptoms of most illnesses will be intensified and complicated by dehydration. Dehydration can also extend the duration of symptoms. Drink plenty of fluids and get plenty of rest. You should be drinking at least half a liter of water an hour to stay hydrated. Electrolyte drinks (ex. Gatorade, Powerade, Pedialyte) are also encouraged. You should be drinking enough fluids to make your urine light yellow, almost clear. If this is not the case, you are not drinking enough water. Please note that some of the treatments indicated below will not be effective if you are not adequately hydrated. Albuterol: May use the albuterol as needed for instances of shortness of breath. Prednisone: Take the prednisone, as directed, in its entirety. Zyrtec or Claritin: May add these medication daily to control underlying symptoms of congestion, sneezing, and other signs of allergies.  These medications are available over-the-counter. Generics: Cetirizine (generic for Zyrtec) and loratadine (generic for Claritin). Montelukast (generic for Singulair): Take this medication daily, regardless of symptoms.  This medication is meant to reduce the effect of triggers on her asthma. Fluticasone: Use fluticasone (generic for Flonase), as directed, for nasal and sinus congestion.  This medication is available over-the-counter. Congestion: Plain guaifenesin (generic for plain Mucinex) may help relieve congestion. Saline sinus rinses and saline nasal sprays may also help relieve congestion. If you do not have high blood pressure, heart problems, or an allergy to such medications, you may also try phenylephrine or Sudafed. Sore throat: Warm liquids or Chloraseptic spray may help soothe a sore throat. Gargle twice a day with a salt  water solution made from a half teaspoon of salt in a cup of warm water.  Follow up: Follow up with a primary care provider within the next two weeks should symptoms fail to resolve. Return: Return to the ED for significantly worsening symptoms, shortness of breath, persistent vomiting, or any other major concerns.  For prescription assistance, may try using prescription discount sites or apps, such as goodrx.com

## 2019-04-20 NOTE — ED Provider Notes (Signed)
[facility] EMERGENCY DEPARTMENT Provider Note   CSN: [id number] Arrival date & time: 04/20/19  0754     History Chief Complaint  Patient presents with  . Asthma    Wendy Morgan is a 44 y.o. female.  HPI     Wendy Morgan is a 44 y.o. female, with a history of asthma, presenting to the ED with concern for asthma exacerbation. Wheezing and some shortness of breath over the last couple days.  She has been using her albuterol inhaler multiple times a day for the last few days.  Symptoms improve temporarily.  She states she has been compliant with her Advair daily.  She was on Singulair in the past, but has not had this medication renewed since she has not seen her PCP recently.  She has a follow-up appointment with her PCP scheduled for February.  Denies fever/chills, cough, chest pain, dizziness, syncope, abdominal pain, N/V/D, or any other complaints.   Past Medical History:  Diagnosis Date  . Asthma   . Bronchiectasis with acute exacerbation [facility])     Patient Active Problem List   Diagnosis Date Noted  . Asthma exacerbation 05/29/2018    Past Surgical History:  Procedure Laterality Date  . TUBAL LIGATION       OB History   No obstetric history on file.     Family History  Problem Relation Age of Onset  . Kidney disease Neg Hx   . Anxiety disorder Neg Hx   . Depression Neg Hx     Social History   Tobacco Use  . Smoking status: Former Smoker    Quit date: 1998    Years since quitting: 23.0  . Smokeless tobacco: Never Used  Substance Use Topics  . Alcohol use: No  . Drug use: No    Home Medications Prior to Admission medications   Medication Sig Start Date End Date Taking? Authorizing Provider  albuterol (VENTOLIN HFA) 108 (90 Base) MCG/ACT inhaler Inhale 1-2 puffs into the lungs every 4 (four) hours as needed for wheezing or shortness of breath. 08/05/18   Claiborne Rigg, NP  budesonide-formoterol (SYMBICORT) 80-4.5 MCG/ACT  inhaler Inhale 2 puffs into the lungs 2 (two) times daily. Patient not taking: Reported on 04/05/2019 06/26/18   Claiborne Rigg, NP  cetirizine (ZYRTEC) 10 MG tablet Take 1 tablet (10 mg total) by mouth daily. Patient not taking: Reported on 04/05/2019 08/05/18   Claiborne Rigg, NP  Fluticasone-Salmeterol (ADVAIR DISKUS) 250-50 MCG/DOSE AEPB Inhale 1 puff into the lungs 2 (two) times daily. 04/05/19   Arby Barrette, MD  montelukast (SINGULAIR) 10 MG tablet Take 1 tablet (10 mg total) by mouth at bedtime. 04/20/19 07/19/19  Ellarie Picking C, PA-C  Olopatadine HCl 0.2 % SOLN Apply 1 drop to eye daily. 04/07/19   Mardella Layman, MD  predniSONE (DELTASONE) 10 MG tablet Take 2 tablets (20 mg total) by mouth 2 (two) times daily with a meal for 5 days. 04/20/19 04/25/19  Arthelia Callicott, Hillard Danker, PA-C    Allergies    Patient has no known allergies.  Review of Systems   Review of Systems  Constitutional: Negative for chills and fever.  Respiratory: Positive for shortness of breath. Negative for cough.   Cardiovascular: Negative for chest pain and leg swelling.  Gastrointestinal: Negative for abdominal pain, diarrhea, nausea and vomiting.  Neurological: Negative for dizziness and syncope.  All other systems reviewed and are negative.   Physical Exam Updated Vital Signs BP 139/84 (  BP Location: Left Arm)   Pulse 89   Temp 98.1 F (36.7 C) (Oral)   Resp 18   SpO2 96%   Physical Exam Vitals and nursing note reviewed.  Constitutional:      General: She is not in acute distress.    Appearance: She is well-developed. She is not diaphoretic.  HENT:     Head: Normocephalic and atraumatic.     Mouth/Throat:     Mouth: Mucous membranes are moist.     Pharynx: Oropharynx is clear.  Eyes:     Conjunctiva/sclera: Conjunctivae normal.  Cardiovascular:     Rate and Rhythm: Normal rate and regular rhythm.     Pulses: Normal pulses.          Radial pulses are 2+ on the right side and 2+ on the left side.        Posterior tibial pulses are 2+ on the right side and 2+ on the left side.     Heart sounds: Normal heart sounds.     Comments: Tactile temperature in the extremities appropriate and equal bilaterally. Pulmonary:     Effort: Pulmonary effort is normal. No respiratory distress.     Breath sounds: Wheezing present.     Comments: Inspiratory and expiratory wheezes in all fields.  No increased work of breathing.  Speaks in full sentences without difficulty. Abdominal:     Palpations: Abdomen is soft.     Tenderness: There is no abdominal tenderness. There is no guarding.  Musculoskeletal:     Cervical back: Neck supple.     Right lower leg: No edema.     Left lower leg: No edema.  Lymphadenopathy:     Cervical: No cervical adenopathy.  Skin:    General: Skin is warm and dry.  Neurological:     Mental Status: She is alert.  Psychiatric:        Mood and Affect: Mood and affect normal.        Speech: Speech normal.        Behavior: Behavior normal.     ED Results / Procedures / Treatments   Labs (all labs ordered are listed, but only abnormal results are displayed) Labs Reviewed - No data to display  EKG None  Radiology No results found.  Procedures Procedures (including critical care time)  Medications Ordered in ED Medications  albuterol (VENTOLIN HFA) 108 (90 Base) MCG/ACT inhaler 6 puff (6 puffs Inhalation Given 04/20/19 0948)  ipratropium (ATROVENT HFA) inhaler 2 puff (2 puffs Inhalation Given 04/20/19 0948)  predniSONE (DELTASONE) tablet 60 mg (60 mg Oral Given 04/20/19 0948)    ED Course  I have reviewed the triage vital signs and the nursing notes.  Pertinent labs & imaging results that were available during my care of the patient were reviewed by me and considered in my medical decision making (see chart for details).    MDM Rules/Calculators/A&P                      Patient presents with shortness of breath that she associates with asthma exacerbation. Patient  improved after inhaler administration here in the ED.  We discussed further assessment and treatment here in the ED, including chest x-ray with explanation, but patient declined.  She states she feels well enough to continue to manage her asthma at home. Patient ambulated without onset of additional symptoms or any noted distress. The patient was given instructions for home care as well as return precautions.  Patient voices understanding of these instructions, accepts the plan, and is comfortable with discharge.   Final Clinical Impression(s) / ED Diagnoses Final diagnoses:  Moderate persistent asthma with exacerbation    Rx / DC Orders ED Discharge Orders         Ordered    montelukast (SINGULAIR) 10 MG tablet  Daily at bedtime,   Status:  Discontinued     04/20/19 0959    predniSONE (DELTASONE) 10 MG tablet  2 times daily with meals     04/20/19 0959    montelukast (SINGULAIR) 10 MG tablet  Daily at bedtime     04/20/19 1007           Lorayne Bender, PA-C 04/20/19 1130    Virgel Manifold, MD 04/20/19 1227

## 2019-04-21 MED FILL — predniSONE 10 MG TABS: 10 | 5 days supply | Qty: 20 | Fill #0

## 2019-04-21 MED FILL — MONTELUKAST SOD 10 MG TAB: 10 | 30 days supply | Qty: 30 | Fill #0

## 2019-04-30 ENCOUNTER — Other Ambulatory Visit: Payer: Self-pay | Admitting: Nurse Practitioner

## 2019-04-30 DIAGNOSIS — J45909 Unspecified asthma, uncomplicated: Secondary | ICD-10-CM

## 2019-04-30 MED FILL — ALBUTEROL SULFATE HFA 108 (: 108 (90 BAS | 16 days supply | Qty: 18 | Fill #0

## 2019-04-30 MED FILL — ADVAIR 250/50 DISKUS: 250-50 | 30 days supply | Qty: 60 | Fill #1

## 2019-05-28 ENCOUNTER — Other Ambulatory Visit: Payer: Self-pay

## 2019-05-28 ENCOUNTER — Emergency Department (HOSPITAL_COMMUNITY)
Admission: EM | Admit: 2019-05-28 | Discharge: 2019-05-28 | Disposition: A | Discharge status: Home / Self Care | Payer: Self-pay | Attending: Emergency Medicine | Admitting: Emergency Medicine

## 2019-05-28 ENCOUNTER — Encounter (HOSPITAL_COMMUNITY): Payer: Self-pay | Admitting: *Deleted

## 2019-05-28 DIAGNOSIS — Z87891 Personal history of nicotine dependence: Secondary | ICD-10-CM | POA: Insufficient documentation

## 2019-05-28 DIAGNOSIS — J4541 Moderate persistent asthma with (acute) exacerbation: Secondary | ICD-10-CM | POA: Insufficient documentation

## 2019-05-28 DIAGNOSIS — Z79899 Other long term (current) drug therapy: Secondary | ICD-10-CM | POA: Insufficient documentation

## 2019-05-28 MED ORDER — ALBUTEROL SULFATE HFA 108 (90 BASE) MCG/ACT IN AERS
8.0000 | INHALATION_SPRAY | Freq: Once | RESPIRATORY_TRACT | Status: AC
  Administered 2019-05-28: 19:00:00 8 via RESPIRATORY_TRACT
  Filled 2019-05-28: qty 6.7

## 2019-05-28 MED ORDER — AEROCHAMBER PLUS FLO-VU MEDIUM MISC
1.0000 | Freq: Once | Status: AC
  Administered 2019-05-28: 19:00:00 1
  Filled 2019-05-28: qty 1

## 2019-05-28 MED ORDER — FLUTICASONE-SALMETEROL 250-50 MCG/DOSE IN AEPB: 1.0000 | INHALATION_SPRAY | Freq: Two times a day (BID) | RESPIRATORY_TRACT | 3 refills | Status: DC

## 2019-05-28 MED ORDER — DEXAMETHASONE SODIUM PHOSPHATE 10 MG/ML IJ SOLN
10.0000 mg | Freq: Once | INTRAMUSCULAR | Status: AC
  Administered 2019-05-28: 19:00:00 10 mg via INTRAMUSCULAR
  Filled 2019-05-28: qty 1

## 2019-05-28 NOTE — ED Triage Notes (Signed)
Asthma flare started yesterday, not relieved with inhaler.

## 2019-05-28 NOTE — ED Provider Notes (Signed)
[facility]-EMERGENCY DEPT Provider Note   CSN: [id number] Arrival date & time: 05/28/19  1823     History Chief Complaint  Patient presents with  . Asthma    Wendy Morgan is a 44 y.o. female with a past medical history of asthma who presents today for evaluation of an asthma exacerbation.  She reports that 2 days ago she ran out of her steroid inhaler and since then has had an asthma flare.  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 She states that this is consistent with her usual asthma flare and not different in any way. She states that she last had albuterol about 1 hour prior to arrival when she had 4 puffs without significant relief. She states that she has a follow-up appointment with her PCP later this month. Of note she has 12 ED visits in the past 6 months with 0 admissions, mostly for asthma exacerbations.   HPI     Past Medical History:  Diagnosis Date  . Asthma   . Bronchiectasis with acute exacerbation [facility])     Patient Active Problem List   Diagnosis Date Noted  . Asthma exacerbation 05/29/2018    Past Surgical History:  Procedure Laterality Date  . TUBAL LIGATION       OB History   No obstetric history on file.     Family History  Problem Relation Age of Onset  . Kidney disease Neg Hx   . Anxiety disorder Neg Hx   . Depression Neg Hx     Social History   Tobacco Use  . Smoking status: Former Smoker    Quit date: 1998    Years since quitting: 23.1  . Smokeless tobacco: Never Used  Substance Use Topics  . Alcohol use: No  . Drug use: No    Home Medications Prior to Admission medications   Medication Sig Start Date End Date Taking? Authorizing Provider  budesonide-formoterol (SYMBICORT) 80-4.5 MCG/ACT inhaler Inhale 2 puffs into the lungs 2 (two) times daily. Patient not taking: Reported on 04/05/2019 06/26/18   Claiborne Rigg, NP  cetirizine (ZYRTEC) 10 MG tablet Take 1 tablet (10 mg total) by  mouth daily. Patient not taking: Reported on 04/05/2019 08/05/18   Claiborne Rigg, NP  Fluticasone-Salmeterol (ADVAIR DISKUS) 250-50 MCG/DOSE AEPB Inhale 1 puff into the lungs 2 (two) times daily. 05/28/19   Cristina Gong, PA-C  montelukast (SINGULAIR) 10 MG tablet Take 1 tablet (10 mg total) by mouth at bedtime. 04/20/19 07/19/19  Joy, Shawn C, PA-C  Olopatadine HCl 0.2 % SOLN Apply 1 drop to eye daily. 04/07/19   Mardella Layman, MD  VENTOLIN HFA 108 (90 Base) MCG/ACT inhaler INHALE 1-2 PUFFS INTO THE LUNGS EVERY 4 (FOUR) HOURS AS NEEDED FOR WHEEZING OR SHORTNESS OF BREATH. 04/30/19   Hoy Register, MD    Allergies    Patient has no known allergies.  Review of Systems   Review of Systems  Constitutional: Negative for chills and fever.  Respiratory: Positive for chest tightness, shortness of breath and wheezing. Negative for cough.   Cardiovascular: Negative for palpitations and leg swelling.  Gastrointestinal: Negative for abdominal pain.  Neurological: Negative for weakness and headaches.  All other systems reviewed and are negative.   Physical Exam Updated Vital Signs BP 135/73 (BP Location: Left Arm)   Pulse 87   Temp 98.9 F (37.2 C) (Oral)   Resp 18   Wt 81.6 kg   SpO2 97%  BMI 30.90 kg/m   Physical Exam Vitals and nursing note reviewed.  Constitutional:      General: She is not in acute distress.    Appearance: She is well-developed. She is not diaphoretic.  HENT:     Head: Normocephalic and atraumatic.     Mouth/Throat:     Mouth: Mucous membranes are moist.  Eyes:     General: No scleral icterus.       Right eye: No discharge.        Left eye: No discharge.     Conjunctiva/sclera: Conjunctivae normal.  Cardiovascular:     Rate and Rhythm: Normal rate and regular rhythm.     Heart sounds: Normal heart sounds.  Pulmonary:     Effort: Pulmonary effort is normal. No respiratory distress.     Breath sounds: No stridor. Wheezing (Diffuse wheezes bilaterally  throughout all air fields.) present.  Abdominal:     General: There is no distension.     Tenderness: There is no abdominal tenderness.  Musculoskeletal:        General: No deformity.     Cervical back: Normal range of motion.  Skin:    General: Skin is warm and dry.  Neurological:     General: No focal deficit present.     Mental Status: She is alert.     Motor: No abnormal muscle tone.  Psychiatric:        Behavior: Behavior normal.     ED Results / Procedures / Treatments   Labs (all labs ordered are listed, but only abnormal results are displayed) Labs Reviewed - No data to display  EKG None  Radiology No results found.  Procedures Procedures (including critical care time)  Medications Ordered in ED Medications  albuterol (VENTOLIN HFA) 108 (90 Base) MCG/ACT inhaler 8 puff (8 puffs Inhalation Given 05/28/19 1921)  AeroChamber Plus Flo-Vu Medium MISC 1 each (1 each Other Given 05/28/19 1921)  dexamethasone (DECADRON) injection 10 mg (10 mg Intramuscular Given 05/28/19 1922)    ED Course  I have reviewed the triage vital signs and the nursing notes.  Pertinent labs & imaging results that were available during my care of the patient were reviewed by me and considered in my medical decision making (see chart for details).  Clinical Course as of May 27 2337  Wed May 28, 2019  2055 Patient reevaluated, she reports feeling better and is requesting discharge home at this time.   [EH]    Clinical Course User Index [EH] Ollen Gross   MDM Rules/Calculators/A&P                     Patient presents today for evaluation of a reported asthma exacerbation.  She was given 8 puffs of albuterol using a spacer and a dose of IM Decadron, after which she reported her symptoms improved and she wished to go home.  She is given a refill on her inhaler that she is out of.  She states that she has a follow-up appointment soon with her primary care doctor.  As she is given a  decadron shot no RX for oral steroids given.    She was offered cxr, additional testing which she made the informed decision to decline.    Return precautions were discussed with patient who states their understanding.  At the time of discharge patient denied any unaddressed complaints or concerns.  Patient is agreeable for discharge home.  Note: Portions of this report may  have been transcribed using voice recognition software. Every effort was made to ensure accuracy; however, inadvertent computerized transcription errors may be present  Final Clinical Impression(s) / ED Diagnoses Final diagnoses:  Moderate persistent asthma with exacerbation    Rx / DC Orders ED Discharge Orders         Ordered    Fluticasone-Salmeterol (ADVAIR DISKUS) 250-50 MCG/DOSE AEPB  2 times daily     05/28/19 2056           Cristina Gong, [place] 05/28/19 2342    Bethann Berkshire, MD 05/30/19 [phone]

## 2019-05-29 MED FILL — FLUTICASONE-SALMETEROL 250-: 250-50 | 30 days supply | Qty: 60 | Fill #2

## 2019-06-02 ENCOUNTER — Ambulatory Visit: Payer: Self-pay | Attending: Nurse Practitioner | Admitting: Nurse Practitioner

## 2019-06-02 ENCOUNTER — Encounter: Payer: Self-pay | Admitting: Nurse Practitioner

## 2019-06-02 ENCOUNTER — Other Ambulatory Visit: Payer: Self-pay

## 2019-06-02 DIAGNOSIS — Z09 Encounter for follow-up examination after completed treatment for conditions other than malignant neoplasm: Secondary | ICD-10-CM

## 2019-06-02 DIAGNOSIS — J4521 Mild intermittent asthma with (acute) exacerbation: Secondary | ICD-10-CM

## 2019-06-02 MED ORDER — ALBUTEROL SULFATE HFA 108 (90 BASE) MCG/ACT IN AERS: 1.0000 | INHALATION_SPRAY | Freq: Four times a day (QID) | RESPIRATORY_TRACT | 1 refills | Status: DC | PRN

## 2019-06-02 MED ORDER — FLUTICASONE-SALMETEROL 250-50 MCG/DOSE IN AEPB: 1.0000 | INHALATION_SPRAY | Freq: Two times a day (BID) | RESPIRATORY_TRACT | 6 refills | Status: DC

## 2019-06-02 MED ORDER — MONTELUKAST SODIUM 10 MG PO TABS: 10.0000 mg | ORAL_TABLET | Freq: Every day | ORAL | 2 refills | Status: DC

## 2019-06-02 MED FILL — MONTELUKAST SOD 10 MG TAB: 10 | 30 days supply | Qty: 30 | Fill #0

## 2019-06-02 MED FILL — ALBUTEROL SULFATE HFA 108 (: 108 (90 BAS | 25 days supply | Qty: 18 | Fill #0

## 2019-06-02 NOTE — Progress Notes (Signed)
Virtual Visit via Telephone Note Due to national recommendations of social distancing due to COVID 19, telehealth visit is felt to be most appropriate for this patient at this time.  I discussed the limitations, risks, security and privacy concerns of performing an evaluation and management service by telephone and the availability of in person appointments. I also discussed with the patient that there may be a patient responsible charge related to this service. The patient expressed understanding and agreed to proceed.    I connected with Wendy Morgan on 06/02/19  at  10:10 AM EST  EDT by telephone and verified that I am speaking with the correct person using two identifiers.   Consent I discussed the limitations, risks, security and privacy concerns of performing an evaluation and management service by telephone and the availability of in person appointments. I also discussed with the patient that there may be a patient responsible charge related to this service. The patient expressed understanding and agreed to proceed.   Location of Patient: Private Residence   Location of Provider: [facility]    Persons participating in Telemedicine visit: Bertram Denver FNP-BC [place] CMA Doreene Burke Brett Fairy    History of Present Illness: Telemedicine visit for: F/U  has a past medical history of Asthma and Bronchiectasis with acute exacerbation (HCC).   Asthma Follow-up  She has previously been evaluated here for asthma and presents for an asthma follow-up. The issue is that she is not coming to pick up her inhalers/medications. She states she was not aware that she had refills available here at the pharmacy. However she does not call the pharmacy prior to going to the ED.  She has been seen in ED >20 times since 04-2018.  Symptoms currently include frequent wheezing. Observed precipitants include cold air and temperature and weather changes.  Frequency of use of  quick-relief meds: SABA daily multiple times throughout the day. She is not taking Singulair or zyrtec and states she was not aware they were available at the pharmacy either. I have instructed her to call the pharmacy a few days prior to running out of her inhaler for refills. Also when I inquired as to how she takes the advair she stated once a day. I instructed her that the advair is 1 puff twice a day and should be used every day and not on an as needed basis.     Past Medical History:  Diagnosis Date  . Asthma   . Bronchiectasis with acute exacerbation [facility])     Past Surgical History:  Procedure Laterality Date  . TUBAL LIGATION      Family History  Problem Relation Age of Onset  . Kidney disease Neg Hx   . Anxiety disorder Neg Hx   . Depression Neg Hx     Social History   Socioeconomic History  . Marital status: Single    Spouse name: Not on file  . Number of children: Not on file  . Years of education: Not on file  . Highest education level: Not on file  Occupational History  . Not on file  Tobacco Use  . Smoking status: Former Smoker    Quit date: 1998    Years since quitting: 23.1  . Smokeless tobacco: Never Used  Substance and Sexual Activity  . Alcohol use: No  . Drug use: No  . Sexual activity: Yes    Birth control/protection: None  Other Topics Concern  . Not on file  Social  History Narrative  . Not on file   Social Determinants of Health   Financial Resource Strain:   . Difficulty of Paying Living Expenses: Not on file  Food Insecurity:   . Worried About Charity fundraiser in the Last Year: Not on file  . Ran Out of Food in the Last Year: Not on file  Transportation Needs:   . Lack of Transportation (Medical): Not on file  . Lack of Transportation (Non-Medical): Not on file  Physical Activity:   . Days of Exercise per Week: Not on file  . Minutes of Exercise per Session: Not on file  Stress:   . Feeling of Stress : Not on file  Social  Connections:   . Frequency of Communication with Friends and Family: Not on file  . Frequency of Social Gatherings with Friends and Family: Not on file  . Attends Religious Services: Not on file  . Active Member of Clubs or Organizations: Not on file  . Attends Archivist Meetings: Not on file  . Marital Status: Not on file     Observations/Objective: Awake, alert and oriented x 3   Review of Systems  Constitutional: Negative for fever, malaise/fatigue and weight loss.  HENT: Negative.  Negative for nosebleeds.   Eyes: Negative.  Negative for blurred vision, double vision and photophobia.  Respiratory: Positive for wheezing. Negative for cough, hemoptysis, sputum production and shortness of breath.   Cardiovascular: Negative.  Negative for chest pain, palpitations and leg swelling.  Gastrointestinal: Negative.  Negative for heartburn, nausea and vomiting.  Musculoskeletal: Negative.  Negative for myalgias.  Neurological: Negative.  Negative for dizziness, focal weakness, seizures and headaches.  Psychiatric/Behavioral: Negative.  Negative for suicidal ideas.    Assessment and Plan: Tiarra was seen today for follow-up.  Diagnoses and all orders for this visit:  Hospital discharge follow-up  Poorly controlled intermittent asthma with acute exacerbation -     albuterol (PROVENTIL HFA) 108 (90 Base) MCG/ACT inhaler; Inhale 1-2 puffs into the lungs every 6 (six) hours as needed for wheezing or shortness of breath. -     montelukast (SINGULAIR) 10 MG tablet; Take 1 tablet (10 mg total) by mouth at bedtime. -     Fluticasone-Salmeterol (ADVAIR DISKUS) 250-50 MCG/DOSE AEPB; Inhale 1 puff into the lungs 2 (two) times daily. Take all of your medications as prescribed.    Follow Up Instructions Return if symptoms worsen or fail to improve.     I discussed the assessment and treatment plan with the patient. The patient was provided an opportunity to ask questions and all were  answered. The patient agreed with the plan and demonstrated an understanding of the instructions.   The patient was advised to call back or seek an in-person evaluation if the symptoms worsen or if the condition fails to improve as anticipated.  I provided 17 minutes of non-face-to-face time during this encounter including median intraservice time, reviewing previous notes, labs, imaging, medications and explaining diagnosis and management.  Gildardo Pounds, FNP-BC

## 2019-06-09 ENCOUNTER — Ambulatory Visit: Payer: Self-pay | Attending: Nurse Practitioner

## 2019-06-09 ENCOUNTER — Other Ambulatory Visit: Payer: Self-pay

## 2019-06-09 DIAGNOSIS — Z09 Encounter for follow-up examination after completed treatment for conditions other than malignant neoplasm: Secondary | ICD-10-CM

## 2019-06-10 ENCOUNTER — Other Ambulatory Visit: Payer: Self-pay

## 2019-06-10 ENCOUNTER — Emergency Department (HOSPITAL_COMMUNITY)
Admission: EM | Admit: 2019-06-10 | Discharge: 2019-06-10 | Disposition: A | Discharge status: Home / Self Care | Payer: Self-pay | Attending: Emergency Medicine | Admitting: Emergency Medicine

## 2019-06-10 ENCOUNTER — Encounter (HOSPITAL_COMMUNITY): Payer: Self-pay

## 2019-06-10 DIAGNOSIS — Z20822 Contact with and (suspected) exposure to covid-19: Secondary | ICD-10-CM | POA: Insufficient documentation

## 2019-06-10 DIAGNOSIS — J4541 Moderate persistent asthma with (acute) exacerbation: Secondary | ICD-10-CM | POA: Insufficient documentation

## 2019-06-10 DIAGNOSIS — Z79899 Other long term (current) drug therapy: Secondary | ICD-10-CM | POA: Insufficient documentation

## 2019-06-10 LAB — CMP14+EGFR
ALT: 15 IU/L (ref 0–32)
AST: 22 IU/L (ref 0–40)
Albumin/Globulin Ratio: 1.5 (ref 1.2–2.2)
Albumin: 3.8 g/dL (ref 3.8–4.8)
Alkaline Phosphatase: 73 IU/L (ref 39–117)
BUN/Creatinine Ratio: 12 (ref 9–23)
BUN: 11 mg/dL (ref 6–24)
Bilirubin Total: 0.3 mg/dL (ref 0.0–1.2)
CO2: 23 mmol/L (ref 20–29)
Calcium: 9 mg/dL (ref 8.7–10.2)
Chloride: 103 mmol/L (ref 96–106)
Creatinine, Ser: 0.95 mg/dL (ref 0.57–1.00)
GFR calc Af Amer: 84 mL/min/{1.73_m2} (ref >59)
GFR calc non Af Amer: 73 mL/min/{1.73_m2} (ref >59)
Globulin, Total: 2.6 g/dL (ref 1.5–4.5)
Glucose: 87 mg/dL (ref 65–99)
Potassium: 4.4 mmol/L (ref 3.5–5.2)
Sodium: 139 mmol/L (ref 134–144)
Total Protein: 6.4 g/dL (ref 6.0–8.5)

## 2019-06-10 LAB — CBC
Hematocrit: 41.8 % (ref 34.0–46.6)
Hemoglobin: 14.3 g/dL (ref 11.1–15.9)
MCH: 29.7 pg (ref 26.6–33.0)
MCHC: 34.2 g/dL (ref 31.5–35.7)
MCV: 87 fL (ref 79–97)
Platelets: 342 10*3/uL (ref 150–450)
RBC: 4.82 x10E6/uL (ref 3.77–5.28)
RDW: 13.3 % (ref 11.7–15.4)
WBC: 13.4 10*3/uL — ABNORMAL HIGH (ref 3.4–10.8)

## 2019-06-10 LAB — HEMOGLOBIN A1C
Est. average glucose Bld gHb Est-mCnc: 111 mg/dL
Hgb A1c MFr Bld: 5.5 % (ref 4.8–5.6)

## 2019-06-10 LAB — LIPID PANEL
Chol/HDL Ratio: 2.9 ratio (ref 0.0–4.4)
Cholesterol, Total: 174 mg/dL (ref 100–199)
HDL: 59 mg/dL (ref >39)
LDL Chol Calc (NIH): 101 mg/dL — ABNORMAL HIGH (ref 0–99)
Triglycerides: 74 mg/dL (ref 0–149)
VLDL Cholesterol Cal: 14 mg/dL (ref 5–40)

## 2019-06-10 MED ORDER — PREDNISONE 20 MG PO TABS
60.0000 mg | ORAL_TABLET | Freq: Once | ORAL | Status: AC
  Administered 2019-06-10: 22:00:00 60 mg via ORAL
  Filled 2019-06-10: qty 3

## 2019-06-10 MED ORDER — ALBUTEROL SULFATE HFA 108 (90 BASE) MCG/ACT IN AERS
5.0000 | INHALATION_SPRAY | Freq: Once | RESPIRATORY_TRACT | Status: AC
  Administered 2019-06-10: 22:00:00 5 via RESPIRATORY_TRACT
  Filled 2019-06-10: qty 6.7

## 2019-06-10 MED ORDER — PREDNISONE 10 MG PO TABS: 40.0000 mg | ORAL_TABLET | Freq: Every day | ORAL | 0 refills | Status: AC

## 2019-06-10 NOTE — ED Notes (Signed)
Pt verbalized dc instructions and follow up care. Alert and ambulatory. No iv. 

## 2019-06-10 NOTE — ED Triage Notes (Signed)
Pt c/o asthma flare up. States she has been using inhaler too much.

## 2019-06-10 NOTE — ED Provider Notes (Signed)
[facility]-EMERGENCY DEPT Provider Note   CSN: [id number] Arrival date & time: 06/10/19  2052     History Chief Complaint  Patient presents with  . Asthma    Wendy Morgan is a 44 y.o. female with history of asthma for which she has frequent ED visits presents for evaluation of acute onset, persistent wheezing and shortness of breath for 1 week.  She reports this feels like her usual asthma exacerbations.  She notes numerous irritants including her workplace and cold weather and thinks this could be flaring up her symptoms.  She notes mild nonproductive cough.  She denies fever, chest pain, abdominal pain, nausea, or vomiting.  She has been using her albuterol inhaler every hour with some temporary relief and taking her Advair Diskus twice daily as prescribed.  She is a non-smoker.  She reports that she thinks she needs to follow-up with a pulmonologist and had a recent visit with her PCP who per chart review discussed medication adherence.  She had outpatient lab work done yesterday which I reviewed.  The history is provided by the patient.       Past Medical History:  Diagnosis Date  . Asthma   . Bronchiectasis with acute exacerbation [facility])     Patient Active Problem List   Diagnosis Date Noted  . Asthma exacerbation 05/29/2018    Past Surgical History:  Procedure Laterality Date  . TUBAL LIGATION       OB History   No obstetric history on file.     Family History  Problem Relation Age of Onset  . Kidney disease Neg Hx   . Anxiety disorder Neg Hx   . Depression Neg Hx     Social History   Tobacco Use  . Smoking status: Former Smoker    Quit date: 1998    Years since quitting: 23.1  . Smokeless tobacco: Never Used  Substance Use Topics  . Alcohol use: No  . Drug use: No    Home Medications Prior to Admission medications   Medication Sig Start Date End Date Taking? Authorizing Provider  albuterol (PROVENTIL HFA) 108 (90 Base)  MCG/ACT inhaler Inhale 1-2 puffs into the lungs every 6 (six) hours as needed for wheezing or shortness of breath. 06/02/19   Claiborne Rigg, NP  cetirizine (ZYRTEC) 10 MG tablet Take 1 tablet (10 mg total) by mouth daily. Patient not taking: Reported on 04/05/2019 08/05/18   Claiborne Rigg, NP  Fluticasone-Salmeterol (ADVAIR DISKUS) 250-50 MCG/DOSE AEPB Inhale 1 puff into the lungs 2 (two) times daily. 06/02/19 07/02/19  Claiborne Rigg, NP  montelukast (SINGULAIR) 10 MG tablet Take 1 tablet (10 mg total) by mouth at bedtime. 06/02/19 08/31/19  Claiborne Rigg, NP  Olopatadine HCl 0.2 % SOLN Apply 1 drop to eye daily. 04/07/19   Mardella Layman, MD  predniSONE (DELTASONE) 10 MG tablet Take 4 tablets (40 mg total) by mouth daily with breakfast for 5 days. 06/10/19 06/15/19  Michela Pitcher A, PA-C    Allergies    Patient has no known allergies.  Review of Systems   Review of Systems  Constitutional: Negative for chills and fever.  Respiratory: Positive for cough, shortness of breath and wheezing.   Cardiovascular: Negative for chest pain.  Gastrointestinal: Negative for abdominal pain, nausea and vomiting.  All other systems reviewed and are negative.   Physical Exam Updated Vital Signs BP (!) 144/89 (BP Location: Right Arm)   Pulse 84   Temp 98.1  F (36.7 C) (Oral)   Resp 15   Ht 5\' 4"  (1.626 m)   Wt 81.6 kg   SpO2 97%   BMI 30.90 kg/m   Physical Exam Vitals and nursing note reviewed.  Constitutional:      General: She is not in acute distress.    Appearance: She is well-developed.     Comments: Resting comfortably in bed  HENT:     Head: Normocephalic and atraumatic.  Eyes:     General:        Right eye: No discharge.        Left eye: No discharge.     Conjunctiva/sclera: Conjunctivae normal.  Neck:     Vascular: No JVD.     Trachea: No tracheal deviation.  Cardiovascular:     Rate and Rhythm: Normal rate and regular rhythm.  Pulmonary:     Effort: Pulmonary effort is  normal.     Breath sounds: Wheezing present.     Comments: Speaking in full sentences without difficulty, SPO2 saturations 96 to 98% on room air.  Diffuse expiratory wheezes. Abdominal:     General: Bowel sounds are normal. There is no distension.     Palpations: Abdomen is soft.     Tenderness: There is no abdominal tenderness. There is no guarding or rebound.  Skin:    General: Skin is warm and dry.     Findings: No erythema.  Neurological:     Mental Status: She is alert.  Psychiatric:        Behavior: Behavior normal.     ED Results / Procedures / Treatments   Labs (all labs ordered are listed, but only abnormal results are displayed) Labs Reviewed  NOVEL CORONAVIRUS, NAA (HOSP ORDER, SEND-OUT TO REF LAB; TAT 18-24 HRS)    EKG None  Radiology No results found.  Procedures Procedures (including critical care time)  Medications Ordered in ED Medications  predniSONE (DELTASONE) tablet 60 mg (60 mg Oral Given 06/10/19 2152)  albuterol (VENTOLIN HFA) 108 (90 Base) MCG/ACT inhaler 5 puff (5 puffs Inhalation Given 06/10/19 2153)    ED Course  I have reviewed the triage vital signs and the nursing notes.  Pertinent labs & imaging results that were available during my care of the patient were reviewed by me and considered in my medical decision making (see chart for details).    MDM Rules/Calculators/A&P                      Wendy Morgan was evaluated in Emergency Department on 06/13/2019 for the symptoms described in the history of present illness. She was evaluated in the context of the global COVID-19 pandemic, which necessitated consideration that the patient might be at risk for infection with the SARS-CoV-2 virus that causes COVID-19. Institutional protocols and algorithms that pertain to the evaluation of patients at risk for COVID-19 are in a state of rapid change based on information released by regulatory bodies including the CDC and federal and state  organizations. These policies and algorithms were followed during the patient's care in the ED.  Patient presenting for evaluation of asthma exacerbation.  She is seen in the ED frequently for these.  She recently had virtual follow-up with her PCP and was told that if she had another ED visit for asthma exacerbation she would be referred to a pulmonologist.  In the ED she is resting comfortably in no apparent distress.  She is speaking in full sentences without difficulty.  She exhibits diffuse expiratory wheezes on auscultation of the lungs.  She was given several puffs of albuterol through an inhaler; unable to provide nebulized breathing treatment in the setting of the COVID-19 pandemic.  She was also given p.o. prednisone.  On reevaluation she is resting comfortably, reports that she is feeling better and would like to go home.  Her wheezing has improved.  Doubt pneumonia.  Low suspicion of ACS/MI, PE, dissection, cardiac tamponade, esophageal rupture, or pneumothorax.  She remains hemodynamically stable and does not exhibit any respiratory distress.  She was ambulated in the ED with stable SPO2 saturations.  She was offered outpatient Covid testing.  Recommend follow-up with PCP or pulmonology for reevaluation of symptoms due to her poorly controlled asthma.  Discussed strict ED return precautions. Patient verbalized understanding of and agreement with plan and is safe for discharge home at this time.    Final Clinical Impression(s) / ED Diagnoses Final diagnoses:  Moderate persistent asthma with exacerbation    Rx / DC Orders ED Discharge Orders         Ordered    predniSONE (DELTASONE) 10 MG tablet  Daily with breakfast     06/10/19 2313           Bennye Alm 06/13/19 1122    Linwood Dibbles, MD 06/14/19 [phone]

## 2019-06-10 NOTE — Discharge Instructions (Addendum)
Start taking prednisone beginning tomorrow as prescribed.  You can use your albuterol inhaler 1 to 2 puffs every 4-6 hours as needed for shortness of breath.  Continue taking your Symbicort twice daily.  Your Covid test will result within 24 hours.  You will receive a phone call if the test is positive, no phone call if the test is negative.  You will also be able to access your results on MyChart.  Follow-up with your primary care provider for reevaluation of your symptoms.  I would also follow-up to discuss referral to pulmonologist.  Return to the emergency department if any concerning signs or symptoms develop such as worsening shortness of breath, chest pains, persistent vomiting or loss of consciousness.

## 2019-06-11 MED FILL — predniSONE 10 MG TABS: 10 | 5 days supply | Qty: 20 | Fill #0

## 2019-06-12 LAB — NOVEL CORONAVIRUS, NAA (HOSP ORDER, SEND-OUT TO REF LAB; TAT 18-24 HRS): SARS-CoV-2, NAA: NOT DETECTED

## 2019-06-13 ENCOUNTER — Other Ambulatory Visit: Payer: Self-pay | Admitting: Nurse Practitioner

## 2019-06-13 DIAGNOSIS — J4541 Moderate persistent asthma with (acute) exacerbation: Secondary | ICD-10-CM

## 2019-06-25 ENCOUNTER — Emergency Department (HOSPITAL_COMMUNITY)
Admission: EM | Admit: 2019-06-25 | Discharge: 2019-06-25 | Disposition: A | Discharge status: Home / Self Care | Payer: Self-pay | Attending: Emergency Medicine | Admitting: Emergency Medicine

## 2019-06-25 ENCOUNTER — Other Ambulatory Visit: Payer: Self-pay

## 2019-06-25 ENCOUNTER — Encounter (HOSPITAL_COMMUNITY): Payer: Self-pay

## 2019-06-25 DIAGNOSIS — Z87891 Personal history of nicotine dependence: Secondary | ICD-10-CM | POA: Insufficient documentation

## 2019-06-25 DIAGNOSIS — Z79899 Other long term (current) drug therapy: Secondary | ICD-10-CM | POA: Insufficient documentation

## 2019-06-25 DIAGNOSIS — J4541 Moderate persistent asthma with (acute) exacerbation: Secondary | ICD-10-CM | POA: Insufficient documentation

## 2019-06-25 MED ORDER — DOXYCYCLINE HYCLATE 100 MG PO CAPS: 100.0000 mg | ORAL_CAPSULE | Freq: Two times a day (BID) | ORAL | 0 refills | Status: DC

## 2019-06-25 MED ORDER — ALBUTEROL SULFATE HFA 108 (90 BASE) MCG/ACT IN AERS: 2.0000 | INHALATION_SPRAY | RESPIRATORY_TRACT | 0 refills | Status: DC | PRN

## 2019-06-25 MED ORDER — PREDNISONE 20 MG PO TABS: 20.0000 mg | ORAL_TABLET | Freq: Two times a day (BID) | ORAL | 0 refills | Status: DC

## 2019-06-25 MED ORDER — PREDNISONE 20 MG PO TABS
60.0000 mg | ORAL_TABLET | Freq: Once | ORAL | Status: AC
  Administered 2019-06-25: 60 mg via ORAL
  Filled 2019-06-25: qty 3

## 2019-06-25 MED FILL — DOXYCYCLINE HYCLATE 100 MG: 100 | 7 days supply | Qty: 14 | Fill #0

## 2019-06-25 MED FILL — predniSONE 20 MG TABS: 20 | 5 days supply | Qty: 10 | Fill #0

## 2019-06-25 MED FILL — ALBUTEROL SULFATE HFA 108 (: 108 (90 BAS | 16 days supply | Qty: 18 | Fill #0

## 2019-06-25 NOTE — ED Provider Notes (Signed)
[facility]-EMERGENCY DEPT Provider Note   CSN: [id number] Arrival date & time: 06/25/19  1128     History Chief Complaint  Patient presents with  . Asthma    Wendy Morgan is a 44 y.o. female.  HPI She presents for recurrent symptoms of asthma, with frequent ED evaluations, for same.  She reports increased trouble breathing for 2 days not responsive to current treatment with Advair and albuterol as needed.  She has had some nasal congestion which is discolored, yellow.  She denies fever, chills, nausea, vomiting, chest pain, cough, head, neck or back pain.  She states that she has an upcoming appointment for a first visit/consultation with pulmonary, in April 2021.  There are no other known modifying factors.    Past Medical History:  Diagnosis Date  . Asthma   . Bronchiectasis with acute exacerbation [facility])     Patient Active Problem List   Diagnosis Date Noted  . Asthma exacerbation 05/29/2018    Past Surgical History:  Procedure Laterality Date  . TUBAL LIGATION       OB History   No obstetric history on file.     Family History  Problem Relation Age of Onset  . Kidney disease Neg Hx   . Anxiety disorder Neg Hx   . Depression Neg Hx     Social History   Tobacco Use  . Smoking status: Former Smoker    Quit date: 1998    Years since quitting: 23.2  . Smokeless tobacco: Never Used  Substance Use Topics  . Alcohol use: No  . Drug use: No    Home Medications Prior to Admission medications   Medication Sig Start Date End Date Taking? Authorizing Provider  albuterol (PROVENTIL HFA) 108 (90 Base) MCG/ACT inhaler Inhale 1-2 puffs into the lungs every 6 (six) hours as needed for wheezing or shortness of breath. 06/02/19   Claiborne Rigg, NP  cetirizine (ZYRTEC) 10 MG tablet Take 1 tablet (10 mg total) by mouth daily. Patient not taking: Reported on 04/05/2019 08/05/18   Claiborne Rigg, NP  Fluticasone-Salmeterol (ADVAIR DISKUS)  250-50 MCG/DOSE AEPB Inhale 1 puff into the lungs 2 (two) times daily. 06/02/19 07/02/19  Claiborne Rigg, NP  montelukast (SINGULAIR) 10 MG tablet Take 1 tablet (10 mg total) by mouth at bedtime. 06/02/19 08/31/19  Claiborne Rigg, NP  Olopatadine HCl 0.2 % SOLN Apply 1 drop to eye daily. 04/07/19   Mardella Layman, MD    Allergies    Patient has no known allergies.  Review of Systems   Review of Systems  All other systems reviewed and are negative.   Physical Exam Updated Vital Signs BP 129/87 (BP Location: Right Arm)   Pulse 94   Temp 98.4 F (36.9 C)   Resp 16   Ht 5\' 4"  (1.626 m)   Wt 81.7 kg   SpO2 95%   BMI 30.92 kg/m   Physical Exam Vitals and nursing note reviewed.  Constitutional:      General: She is not in acute distress.    Appearance: She is well-developed. She is not ill-appearing, toxic-appearing or diaphoretic.  HENT:     Head: Normocephalic and atraumatic.     Right Ear: External ear normal.     Left Ear: External ear normal.  Eyes:     Conjunctiva/sclera: Conjunctivae normal.     Pupils: Pupils are equal, round, and reactive to light.  Neck:     Trachea: Phonation normal.  Cardiovascular:     Rate and Rhythm: Normal rate and regular rhythm.     Heart sounds: Normal heart sounds.  Pulmonary:     Effort: Pulmonary effort is normal. No respiratory distress.     Breath sounds: No stridor. Wheezing and rhonchi present.  Abdominal:     General: There is no distension.  Musculoskeletal:        General: Normal range of motion.     Cervical back: Normal range of motion and neck supple.  Skin:    General: Skin is warm and dry.  Neurological:     Mental Status: She is alert and oriented to person, place, and time.     Cranial Nerves: No cranial nerve deficit.     Sensory: No sensory deficit.     Motor: No abnormal muscle tone.     Coordination: Coordination normal.  Psychiatric:        Mood and Affect: Mood normal.        Behavior: Behavior normal.          Thought Content: Thought content normal.        Judgment: Judgment normal.     ED Results / Procedures / Treatments   Labs (all labs ordered are listed, but only abnormal results are displayed) Labs Reviewed - No data to display  EKG None  Radiology No results found.  Procedures Procedures (including critical care time)  Medications Ordered in ED Medications - No data to display  ED Course  I have reviewed the triage vital signs and the nursing notes.  Pertinent labs & imaging results that were available during my care of the patient were reviewed by me and considered in my medical decision making (see chart for details).    MDM Rules/Calculators/A&P                       Patient Vitals for the past 24 hrs:  BP Temp Pulse Resp SpO2 Height Weight  06/25/19 1141 -- -- -- -- -- 5\' 4"  (1.626 m) 81.7 kg  06/25/19 1138 129/87 98.4 F (36.9 C) 94 16 95 % -- --    11:58 AM Reevaluation with update and discussion. After initial assessment and treatment, an updated evaluation reveals no change in clinical status, findings discussed with the patient and all questions answered. 08/25/19   Medical Decision Making: Recurrent asthma, without respiratory distress.  No indication for further ED treatment/evaluation or hospitalization.  She has an upcoming pulmonary appointment which will be beneficial since she has such frequent ED evaluations for asthma.  Doubt COVID-19 infection.  [name] was evaluated in Emergency Department on 06/25/2019 for the symptoms described in the history of present illness. She was evaluated in the context of the global COVID-19 pandemic, which necessitated consideration that the patient might be at risk for infection with the SARS-CoV-2 virus that causes COVID-19. Institutional protocols and algorithms that pertain to the evaluation of patients at risk for COVID-19 are in a state of rapid change based on information released by regulatory  bodies including the CDC and federal and state organizations. These policies and algorithms were followed during the patient's care in the ED.   CRITICAL CARE-no Performed by: 08/25/2019   Nursing Notes Reviewed/ Care Coordinated Applicable Imaging Reviewed Interpretation of Laboratory Data incorporated into ED treatment  The patient appears reasonably screened and/or stabilized for discharge and I doubt any other medical condition or other [facility] requiring further screening, evaluation, or  treatment in the ED at this time prior to discharge.  Plan: Home Medications-continue usual; Home Treatments-rest, fluids; return here if the recommended treatment, does not improve the symptoms; Recommended follow up-PCP,prn    Final Clinical Impression(s) / ED Diagnoses Final diagnoses:  None    Rx / DC Orders ED Discharge Orders    None       Daleen Bo, MD 06/25/19 1201

## 2019-06-25 NOTE — ED Triage Notes (Signed)
Pt reports asthma flare-up starting yesterday. Pt reports using home medications without relief. Pt wheezing audibly at times.

## 2019-06-25 NOTE — Discharge Instructions (Signed)
Take the medicine as directed.  Follow-up with the pulmonologist as scheduled.  See your doctor if needed for problems.

## 2019-07-08 ENCOUNTER — Emergency Department (HOSPITAL_COMMUNITY): Payer: Self-pay

## 2019-07-08 ENCOUNTER — Encounter (HOSPITAL_COMMUNITY): Payer: Self-pay

## 2019-07-08 ENCOUNTER — Emergency Department (HOSPITAL_COMMUNITY)
Admission: EM | Admit: 2019-07-08 | Discharge: 2019-07-08 | Disposition: A | Discharge status: Home / Self Care | Payer: Self-pay | Attending: Emergency Medicine | Admitting: Emergency Medicine

## 2019-07-08 ENCOUNTER — Other Ambulatory Visit: Payer: Self-pay

## 2019-07-08 DIAGNOSIS — R05 Cough: Secondary | ICD-10-CM | POA: Insufficient documentation

## 2019-07-08 DIAGNOSIS — Z87891 Personal history of nicotine dependence: Secondary | ICD-10-CM | POA: Insufficient documentation

## 2019-07-08 DIAGNOSIS — Z79899 Other long term (current) drug therapy: Secondary | ICD-10-CM | POA: Insufficient documentation

## 2019-07-08 DIAGNOSIS — R0602 Shortness of breath: Secondary | ICD-10-CM | POA: Insufficient documentation

## 2019-07-08 DIAGNOSIS — R059 Cough, unspecified: Secondary | ICD-10-CM

## 2019-07-08 DIAGNOSIS — Z20822 Contact with and (suspected) exposure to covid-19: Secondary | ICD-10-CM | POA: Insufficient documentation

## 2019-07-08 DIAGNOSIS — J45909 Unspecified asthma, uncomplicated: Secondary | ICD-10-CM | POA: Insufficient documentation

## 2019-07-08 LAB — SARS CORONAVIRUS 2 (TAT 6-24 HRS): SARS Coronavirus 2: NEGATIVE

## 2019-07-08 LAB — POC URINE PREG, ED: Preg Test, Ur: NEGATIVE

## 2019-07-08 MED ORDER — FLUTICASONE PROPIONATE 50 MCG/ACT NA SUSP: 2.0000 | Freq: Every day | NASAL | 0 refills | Status: DC

## 2019-07-08 MED ORDER — PREDNISONE 10 MG (21) PO TBPK: ORAL_TABLET | ORAL | 0 refills | Status: DC

## 2019-07-08 MED ORDER — IPRATROPIUM BROMIDE HFA 17 MCG/ACT IN AERS
2.0000 | INHALATION_SPRAY | Freq: Once | RESPIRATORY_TRACT | Status: AC
  Administered 2019-07-08: 2 via RESPIRATORY_TRACT
  Filled 2019-07-08: qty 12.9

## 2019-07-08 MED ORDER — BENZONATATE 100 MG PO CAPS: 100.0000 mg | ORAL_CAPSULE | Freq: Three times a day (TID) | ORAL | 0 refills | Status: DC

## 2019-07-08 MED FILL — predniSONE 10 MG TABS: 10 | 12 days supply | Qty: 42 | Fill #0

## 2019-07-08 MED FILL — BENZONATATE 100 MG CAPS: 100 | 7 days supply | Qty: 21 | Fill #0

## 2019-07-08 MED FILL — FLUTICASONE PROP 50 MCG SPR: 50 | 30 days supply | Qty: 16 | Fill #0

## 2019-07-08 NOTE — ED Provider Notes (Signed)
[place] DEPT Provider Note   CSN: [id number] Arrival date & time: 07/08/19  6948     History Chief Complaint  Patient presents with  . Cough  . Asthma    Wendy Morgan is a 44 y.o. female.  HPI     Wendy Morgan is a 44 y.o. female, with a history of asthma, presenting to the ED with cough and shortness of breath for the last couple days.  She has also had nasal congestion and rhinorrhea for about the past week. Her albuterol inhaler will help for a short amount of time, but then shortness of breath recurs.  Patient was seen in the ED March 10 for asthma exacerbation and possible sinus infection.  She was prescribed a course of doxycycline and prednisone.  She states her PCP has given her a referral to pulmonology due to her recurrent asthma exacerbations and she has this appointment in April.  Denies fever/chills, difficulty swallowing, chest pain, abdominal pain, N/V/D, lower extremity edema/pain, or any other complaints.  Past Medical History:  Diagnosis Date  . Asthma   . Bronchiectasis with acute exacerbation [facility])     Patient Active Problem List   Diagnosis Date Noted  . Asthma exacerbation 05/29/2018    Past Surgical History:  Procedure Laterality Date  . TUBAL LIGATION       OB History   No obstetric history on file.     Family History  Problem Relation Age of Onset  . Healthy Mother   . Healthy Father   . Kidney disease Neg Hx   . Anxiety disorder Neg Hx   . Depression Neg Hx     Social History   Tobacco Use  . Smoking status: Former Smoker    Quit date: 1998    Years since quitting: 23.2  . Smokeless tobacco: Never Used  Substance Use Topics  . Alcohol use: No  . Drug use: No    Home Medications Prior to Admission medications   Medication Sig Start Date End Date Taking? Authorizing Provider  albuterol (PROVENTIL HFA) 108 (90 Base) MCG/ACT inhaler Inhale 1-2 puffs into the lungs every 6 (six)  hours as needed for wheezing or shortness of breath. 06/02/19   Gildardo Pounds, NP  albuterol (VENTOLIN HFA) 108 (90 Base) MCG/ACT inhaler Inhale 2 puffs into the lungs every 4 (four) hours as needed for wheezing or shortness of breath. 06/25/19   Daleen Bo, MD  benzonatate (TESSALON) 100 MG capsule Take 1 capsule (100 mg total) by mouth every 8 (eight) hours. 07/08/19   Wendy Whitlatch Morgan, Wendy Morgan  cetirizine (ZYRTEC) 10 MG tablet Take 1 tablet (10 mg total) by mouth daily. Patient not taking: Reported on 04/05/2019 08/05/18   Gildardo Pounds, NP  doxycycline (VIBRAMYCIN) 100 MG capsule Take 1 capsule (100 mg total) by mouth 2 (two) times daily. One po bid x 7 days 06/25/19   Daleen Bo, MD  fluticasone [facility]) 50 MCG/ACT nasal spray Place 2 sprays into both nostrils daily. 07/08/19   Wendy Polzin Morgan, Wendy Morgan  Fluticasone-Salmeterol (ADVAIR DISKUS) 250-50 MCG/DOSE AEPB Inhale 1 puff into the lungs 2 (two) times daily. 06/02/19 07/02/19  Gildardo Pounds, NP  montelukast (SINGULAIR) 10 MG tablet Take 1 tablet (10 mg total) by mouth at bedtime. 06/02/19 08/31/19  Gildardo Pounds, NP  Olopatadine HCl 0.2 % SOLN Apply 1 drop to eye daily. 04/07/19   Vanessa Kick, MD  predniSONE (STERAPRED UNI-PAK 21 TAB) 10 MG (21)  TBPK tablet Take 6 tabs by mouth daily  for 2 days, then 5 tabs for 2 days, then 4 tabs for 2 days, then 3 tabs for 2 days, 2 tabs for 2 days, then 1 tab by mouth daily for 2 days 07/08/19   Wendy Rutherford Morgan, Wendy Morgan    Allergies    Patient has no known allergies.  Review of Systems   Review of Systems  Constitutional: Negative for chills, diaphoresis and fever.  HENT: Positive for congestion and rhinorrhea. Negative for sore throat, trouble swallowing and voice change.   Respiratory: Positive for cough and shortness of breath.   Gastrointestinal: Negative for abdominal pain, diarrhea, nausea and vomiting.  Musculoskeletal: Negative for neck pain.  Neurological: Negative for dizziness, syncope and  weakness.  All other systems reviewed and are negative.   Physical Exam Updated Vital Signs BP 122/81 (BP Location: Left Arm)   Pulse 77   Temp 98 F (36.7 Morgan) (Oral)   Resp 16   Ht 5\' 4"  (1.626 m)   Wt 81.6 kg   SpO2 96%   BMI 30.90 kg/m   Physical Exam Vitals and nursing note reviewed.  Constitutional:      General: She is not in acute distress.    Appearance: She is well-developed. She is not diaphoretic.     Comments: Sitting in the exam chair, looking at her phone, appears relaxed.  HENT:     Head: Normocephalic and atraumatic.     Nose: Mucosal edema, congestion and rhinorrhea present.     Right Sinus: No maxillary sinus tenderness or frontal sinus tenderness.     Left Sinus: No maxillary sinus tenderness or frontal sinus tenderness.     Mouth/Throat:     Mouth: Mucous membranes are moist.     Pharynx: Oropharynx is clear.  Eyes:     Conjunctiva/sclera: Conjunctivae normal.  Cardiovascular:     Rate and Rhythm: Normal rate and regular rhythm.     Pulses: Normal pulses.          Radial pulses are 2+ on the right side and 2+ on the left side.     Heart sounds: Normal heart sounds.     Comments: Tactile temperature in the extremities appropriate and equal bilaterally. Pulmonary:     Effort: Pulmonary effort is normal. No respiratory distress.     Breath sounds: Wheezing and rhonchi present.     Comments: No increased work of breathing noted.  Speaks in full sentences without noted difficulty. Abdominal:     Tenderness: There is no guarding.  Musculoskeletal:     Cervical back: Neck supple.     Right lower leg: No edema.     Left lower leg: No edema.  Lymphadenopathy:     Cervical: No cervical adenopathy.  Skin:    General: Skin is warm and dry.  Neurological:     Mental Status: She is alert.  Psychiatric:        Mood and Affect: Mood and affect normal.        Speech: Speech normal.        Behavior: Behavior normal.     ED Results / Procedures /  Treatments   Labs (all labs ordered are listed, but only abnormal results are displayed) Labs Reviewed  SARS CORONAVIRUS 2 (TAT 6-24 HRS)  POC URINE PREG, ED    EKG None  Radiology DG Chest Portable 1 View  Result Date: 07/08/2019 CLINICAL DATA:  Cough and shortness of breath. EXAM: PORTABLE  CHEST 1 VIEW COMPARISON:  04/11/2019 FINDINGS: The cardiomediastinal silhouette is unchanged with normal heart size. No airspace consolidation, edema, pleural effusion, pneumothorax is identified. No acute osseous abnormality is seen. IMPRESSION: No active disease. Electronically Signed   By: Sebastian Ache M.D.   On: 07/08/2019 11:24    Procedures Procedures (including critical care time)  Medications Ordered in ED Medications  ipratropium (ATROVENT HFA) inhaler 2 puff (2 puffs Inhalation Given 07/08/19 1051)    ED Course  I have reviewed the triage vital signs and the nursing notes.  Pertinent labs & imaging results that were available during my care of the patient were reviewed by me and considered in my medical decision making (see chart for details).    MDM Rules/Calculators/A&P                      Patient presents with cough and shortness of breath.  She suspects asthma exacerbation. Patient is nontoxic appearing, afebrile, not tachycardic, not tachypneic, not hypotensive, maintains excellent SPO2 on room air, and is in no apparent distress.   I have reviewed the patient's chart to obtain more information.  I reviewed and interpreted the patient's labs and radiological studies. No acute abnormality on chest x-ray. Patient had some temporary improvement with the prednisone following her previous ED visit, but then symptoms again worsened.  I thought perhaps she may need a longer course of prednisone.  She could also use evaluation by pulmonology and has an appropriate appointment set up.  The patient was given instructions for home care as well as return precautions. Patient voices  understanding of these instructions, accepts the plan, and is comfortable with discharge.   Vitals:   07/08/19 0947 07/08/19 1138  BP: 122/81 121/70  Pulse: 77 89  Resp: 16 19  Temp: 98 F (36.7 Morgan) 98.2 F (36.8 Morgan)  TempSrc: Oral Oral  SpO2: 96% 96%  Weight: 81.6 kg   Height: 5\' 4"  (1.626 m)    NYKIA TURKO was evaluated in Emergency Department on 07/08/2019 for the symptoms described in the history of present illness. She was evaluated in the context of the global COVID-19 pandemic, which necessitated consideration that the patient might be at risk for infection with the SARS-CoV-2 virus that causes COVID-19. Institutional protocols and algorithms that pertain to the evaluation of patients at risk for COVID-19 are in a state of rapid change based on information released by regulatory bodies including the CDC and federal and state organizations. These policies and algorithms were followed during the patient's care in the ED.  Final Clinical Impression(s) / ED Diagnoses Final diagnoses:  Cough  Shortness of breath    Rx / DC Orders ED Discharge Orders         Ordered    predniSONE (STERAPRED UNI-PAK 21 TAB) 10 MG (21) TBPK tablet     07/08/19 1129    fluticasone (FLONASE) 50 MCG/ACT nasal spray  Daily     07/08/19 1129    benzonatate (TESSALON) 100 MG capsule  Every 8 hours     07/08/19 1132           07/10/19, Wendy Morgan 07/08/19 1151    07/10/19, MD 07/08/19 1705

## 2019-07-08 NOTE — ED Triage Notes (Signed)
Patient c/o a productive cough with clear sputum and reports a history of asthma. Patient states she has been using her Albuterol inhaler with no relief. Patient having expiratory wheezing.

## 2019-07-08 NOTE — Discharge Instructions (Addendum)
General Viral Syndrome Care Instructions:  Your symptoms are likely consistent with a viral illness. Viruses do not require or respond to antibiotics. Treatment is symptomatic care and it is important to note that these symptoms may last for 7-14 days.   Hand washing: Wash your hands throughout the day, but especially before and after touching the face, using the restroom, sneezing, coughing, or touching surfaces that have been coughed or sneezed upon. Hydration: Symptoms of most illnesses will be intensified and complicated by dehydration. Dehydration can also extend the duration of symptoms. Drink plenty of fluids and get plenty of rest. You should be drinking at least half a liter of water an hour to stay hydrated. Electrolyte drinks (ex. Gatorade, Powerade, Pedialyte) are also encouraged. You should be drinking enough fluids to make your urine light yellow, almost clear. If this is not the case, you are not drinking enough water. Please note that some of the treatments indicated below will not be effective if you are not adequately hydrated. Pain or fever: Ibuprofen, Naproxen, or acetaminophen (generic for Tylenol) for pain or fever.  Antiinflammatory medications: Take 600 mg of ibuprofen every 6 hours or 440 mg (over the counter dose) to 500 mg (prescription dose) of naproxen every 12 hours for the next 3 days. After this time, these medications may be used as needed for pain. Take these medications with food to avoid upset stomach. Choose only one of these medications, do not take them together. Acetaminophen (generic for Tylenol): Should you continue to have additional pain while taking the ibuprofen or naproxen, you may add in acetaminophen as needed. Your daily total maximum amount of acetaminophen from all sources should be limited to 4000mg /day for persons without liver problems, or 2000mg /day for those with liver problems. Cough: Use the benzonatate (generic for Tessalon) for cough.  Teas,  warm liquids, broths, and honey can also help with cough. Ipratropium: May use the ipratropium (generic for Atrovent) as needed for instances of shortness of breath. Prednisone: Take the prednisone, as directed, in its entirety. Zyrtec or Claritin: May add these medication daily to control underlying symptoms of congestion, sneezing, and other signs of allergies.  These medications are available over-the-counter. Generics: Cetirizine (generic for Zyrtec) and loratadine (generic for Claritin). Fluticasone: Use fluticasone (generic for Flonase), as directed, for nasal and sinus congestion.  This medication is available over-the-counter. Congestion: Plain guaifenesin (generic for plain Mucinex) may help relieve congestion. Saline sinus rinses and saline nasal sprays may also help relieve congestion. If you do not have high blood pressure, heart problems, or an allergy to such medications, you may also try phenylephrine or Sudafed. Sore throat: Warm liquids or Chloraseptic spray may help soothe a sore throat. Gargle twice a day with a salt water solution made from a half teaspoon of salt in a cup of warm water.  Follow up: Follow up with a primary care provider within the next two weeks should symptoms fail to resolve. Return: Return to the ED for significantly worsening symptoms, shortness of breath, persistent vomiting, large amounts of blood in stool, or any other major concerns.  For prescription assistance, may try using prescription discount sites or apps, such as goodrx.com

## 2019-07-20 ENCOUNTER — Emergency Department (HOSPITAL_COMMUNITY)
Admission: EM | Admit: 2019-07-20 | Discharge: 2019-07-20 | Disposition: A | Discharge status: Home / Self Care | Payer: Self-pay | Attending: Emergency Medicine | Admitting: Emergency Medicine

## 2019-07-20 ENCOUNTER — Other Ambulatory Visit: Payer: Self-pay

## 2019-07-20 ENCOUNTER — Emergency Department (HOSPITAL_COMMUNITY): Payer: Self-pay

## 2019-07-20 DIAGNOSIS — Z5321 Procedure and treatment not carried out due to patient leaving prior to being seen by health care provider: Secondary | ICD-10-CM | POA: Insufficient documentation

## 2019-07-20 DIAGNOSIS — R0602 Shortness of breath: Secondary | ICD-10-CM | POA: Insufficient documentation

## 2019-07-20 MED ORDER — ALBUTEROL SULFATE HFA 108 (90 BASE) MCG/ACT IN AERS: 2.0000 | INHALATION_SPRAY | Freq: Once | RESPIRATORY_TRACT | Status: DC

## 2019-07-20 MED ORDER — ALBUTEROL SULFATE HFA 108 (90 BASE) MCG/ACT IN AERS
INHALATION_SPRAY | RESPIRATORY_TRACT | Status: AC
  Filled 2019-07-20: qty 6.7

## 2019-07-20 NOTE — ED Triage Notes (Signed)
Pt woke up with sob ans wheezing in her chest. Pt has ran out of her inhaler meds. Pt is wheezing and feeling like she can't move air. Hx asthma

## 2019-07-20 NOTE — ED Notes (Signed)
This tech called pts name x3 no response.

## 2019-07-21 ENCOUNTER — Emergency Department (HOSPITAL_COMMUNITY)
Admission: EM | Admit: 2019-07-21 | Discharge: 2019-07-21 | Disposition: A | Discharge status: Home / Self Care | Payer: Self-pay | Attending: Emergency Medicine | Admitting: Emergency Medicine

## 2019-07-21 ENCOUNTER — Encounter (HOSPITAL_COMMUNITY): Payer: Self-pay | Admitting: Emergency Medicine

## 2019-07-21 ENCOUNTER — Other Ambulatory Visit: Payer: Self-pay

## 2019-07-21 DIAGNOSIS — Z87891 Personal history of nicotine dependence: Secondary | ICD-10-CM | POA: Insufficient documentation

## 2019-07-21 DIAGNOSIS — R0602 Shortness of breath: Secondary | ICD-10-CM | POA: Insufficient documentation

## 2019-07-21 DIAGNOSIS — J4541 Moderate persistent asthma with (acute) exacerbation: Secondary | ICD-10-CM | POA: Insufficient documentation

## 2019-07-21 DIAGNOSIS — Z79899 Other long term (current) drug therapy: Secondary | ICD-10-CM | POA: Insufficient documentation

## 2019-07-21 MED ORDER — IPRATROPIUM BROMIDE 0.02 % IN SOLN
0.5000 mg | Freq: Once | RESPIRATORY_TRACT | Status: AC
  Administered 2019-07-21: 0.5 mg via RESPIRATORY_TRACT
  Filled 2019-07-21: qty 2.5

## 2019-07-21 MED ORDER — ALBUTEROL SULFATE (2.5 MG/3ML) 0.083% IN NEBU
5.0000 mg | INHALATION_SOLUTION | Freq: Once | RESPIRATORY_TRACT | Status: AC
  Administered 2019-07-21: 06:00:00 5 mg via RESPIRATORY_TRACT
  Filled 2019-07-21: qty 6

## 2019-07-21 MED ORDER — PREDNISONE 20 MG PO TABS: 60.0000 mg | ORAL_TABLET | Freq: Every day | ORAL | 0 refills | Status: DC

## 2019-07-21 MED ORDER — METHYLPREDNISOLONE SODIUM SUCC 125 MG IJ SOLR
125.0000 mg | Freq: Once | INTRAMUSCULAR | Status: AC
  Administered 2019-07-21: 125 mg via INTRAMUSCULAR
  Filled 2019-07-21: qty 2

## 2019-07-21 MED ORDER — ALBUTEROL SULFATE (2.5 MG/3ML) 0.083% IN NEBU
5.0000 mg | INHALATION_SOLUTION | Freq: Once | RESPIRATORY_TRACT | Status: AC
  Administered 2019-07-21: 5 mg via RESPIRATORY_TRACT
  Filled 2019-07-21: qty 6

## 2019-07-21 MED ORDER — ALBUTEROL SULFATE HFA 108 (90 BASE) MCG/ACT IN AERS: 2.0000 | INHALATION_SPRAY | RESPIRATORY_TRACT | 1 refills | Status: DC | PRN

## 2019-07-21 MED FILL — ALBUTEROL SULFATE HFA 108 (: 108 (90 BAS | 25 days supply | Qty: 18 | Fill #0

## 2019-07-21 MED FILL — predniSONE 20 MG TABS: 20 | 5 days supply | Qty: 15 | Fill #0

## 2019-07-21 NOTE — ED Triage Notes (Signed)
Pt reports having issues with asthma for the last several weeks and no improvement with Proventil inhaler. Pt reports to having appt with pulmonologist on Tuesday.

## 2019-07-21 NOTE — ED Provider Notes (Addendum)
TIME SEEN: 4:51 AM  CHIEF COMPLAINT: Shortness of breath, wheezing  HPI: Patient is a 44 year old female with history of asthma who presents to the emergency department several days of wheezing.  Reports she has had several weeks of problems with asthma exacerbation.  She reports Proventil is not helping significantly.  Just finished a course of steroids.  She does appear to visit the emergency department frequently for the same.  Reports she has an appointment to see a pulmonologist this week.  No fevers.  No productive cough.  No lower extremity swelling or pain.  ROS: See HPI Constitutional: no fever  Eyes: no drainage  ENT: no runny nose   Cardiovascular:  no chest pain  Resp: SOB  GI: no vomiting GU: no dysuria Integumentary: no rash  Allergy: no hives  Musculoskeletal: no leg swelling  Neurological: no slurred speech ROS otherwise negative  PAST MEDICAL HISTORY/PAST SURGICAL HISTORY:  Past Medical History:  Diagnosis Date  . Asthma   . Bronchiectasis with acute exacerbation (HCC)     MEDICATIONS:  Prior to Admission medications   Medication Sig Start Date End Date Taking? Authorizing Provider  albuterol (PROVENTIL HFA) 108 (90 Base) MCG/ACT inhaler Inhale 1-2 puffs into the lungs every 6 (six) hours as needed for wheezing or shortness of breath. 06/02/19   Gildardo Pounds, NP  albuterol (VENTOLIN HFA) 108 (90 Base) MCG/ACT inhaler Inhale 2 puffs into the lungs every 4 (four) hours as needed for wheezing or shortness of breath. 06/25/19   Daleen Bo, MD  benzonatate (TESSALON) 100 MG capsule Take 1 capsule (100 mg total) by mouth every 8 (eight) hours. 07/08/19   Joy, Shawn C, PA-C  cetirizine (ZYRTEC) 10 MG tablet Take 1 tablet (10 mg total) by mouth daily. Patient not taking: Reported on 04/05/2019 08/05/18   Gildardo Pounds, NP  doxycycline (VIBRAMYCIN) 100 MG capsule Take 1 capsule (100 mg total) by mouth 2 (two) times daily. One po bid x 7 days 06/25/19   Daleen Bo,  MD  fluticasone [facility]) 50 MCG/ACT nasal spray Place 2 sprays into both nostrils daily. 07/08/19   Joy, Shawn C, PA-C  Fluticasone-Salmeterol (ADVAIR DISKUS) 250-50 MCG/DOSE AEPB Inhale 1 puff into the lungs 2 (two) times daily. 06/02/19 07/02/19  Gildardo Pounds, NP  montelukast (SINGULAIR) 10 MG tablet Take 1 tablet (10 mg total) by mouth at bedtime. 06/02/19 08/31/19  Gildardo Pounds, NP  Olopatadine HCl 0.2 % SOLN Apply 1 drop to eye daily. 04/07/19   Vanessa Kick, MD  predniSONE (STERAPRED UNI-PAK 21 TAB) 10 MG (21) TBPK tablet Take 6 tabs by mouth daily  for 2 days, then 5 tabs for 2 days, then 4 tabs for 2 days, then 3 tabs for 2 days, 2 tabs for 2 days, then 1 tab by mouth daily for 2 days 07/08/19   Arlean Hopping C, PA-C    ALLERGIES:  No Known Allergies  SOCIAL HISTORY:  Social History   Tobacco Use  . Smoking status: Former Smoker    Quit date: 1998    Years since quitting: 23.2  . Smokeless tobacco: Never Used  Substance Use Topics  . Alcohol use: No    FAMILY HISTORY: Family History  Problem Relation Age of Onset  . Healthy Mother   . Healthy Father   . Kidney disease Neg Hx   . Anxiety disorder Neg Hx   . Depression Neg Hx     EXAM: BP (!) 145/79 (BP Location: Right Arm)  Pulse 98   Temp 98 F (36.7 C) (Oral)   Resp (!) 22   Ht 5\' 4"  (1.626 m)   Wt 81.6 kg   SpO2 98%   BMI 30.90 kg/m  CONSTITUTIONAL: Alert and oriented and responds appropriately to questions. Well-appearing; well-nourished HEAD: Normocephalic EYES: Conjunctivae clear, pupils appear equal, EOM appear intact ENT: normal nose; moist mucous membranes NECK: Supple, normal ROM CARD: RRR; S1 and S2 appreciated; no murmurs, no clicks, no rubs, no gallops RESP: Normal chest excursion without splinting or tachypnea; breath sounds equal bilaterally, in-store and expiratory wheezes with diminished aeration at her bases, no rhonchi or rales, no hypoxia or respiratory distress, speaking full  sentences ABD/GI: Normal bowel sounds; non-distended; soft, non-tender, no rebound, no guarding, no peritoneal signs, no hepatosplenomegaly BACK:  The back appears normal EXT: Normal ROM in all joints; no deformity noted, no edema; no cyanosis SKIN: Normal color for age and race; warm; no rash on exposed skin NEURO: Moves all extremities equally PSYCH: The patient's mood and manner are appropriate.   MEDICAL DECISION MAKING: Patient here with asthma exacerbation.  Has appointment to see pulmonologist this week.  Requesting "a shot" of steroids.  Will give IM Solu-Medrol.  Will give albuterol, Atrovent.  No infectious symptoms.  Doubt Covid or pneumonia.  I do not feel she needs repeat chest x-ray.  Patient just left to [facility] emergency department yesterday without being seen.  Chest x-ray done yesterday morning showed no acute abnormality.  I have independently reviewed and interpreted these images.  ED PROGRESS: 5:30 AM  Pt's expiratory wheezes have resolved but still having expiratory wheezes.  Will give second breathing treatment.   6:15 AM Pt's breath sounds have improved but she is still wheezing.  Have offered third treatment which she declines.  She states that her wheezing never resolves until she has had steroids.  Have offered admission which she declines.  She reports she is feeling better and has not been hypoxic or in respiratory distress here.  She has pulmonology follow-up Tuesday.  Will refill Proventil and discharged on steroid burst.  At this time, I do not feel there is any life-threatening condition present. I have reviewed, interpreted and discussed all results (EKG, imaging, lab, urine as appropriate) and exam findings with patient/family. I have reviewed nursing notes and appropriate previous records.  I feel the patient is safe to be discharged home without further emergent workup and can continue workup as an outpatient as needed. Discussed usual and customary return  precautions. Patient/family verbalize understanding and are comfortable with this plan.  Outpatient follow-up has been provided as needed. All questions have been answered.      Wendy Morgan was evaluated in Emergency Department on 07/21/2019 for the symptoms described in the history of present illness. She was evaluated in the context of the global COVID-19 pandemic, which necessitated consideration that the patient might be at risk for infection with the SARS-CoV-2 virus that causes COVID-19. Institutional protocols and algorithms that pertain to the evaluation of patients at risk for COVID-19 are in a state of rapid change based on information released by regulatory bodies including the CDC and federal and state organizations. These policies and algorithms were followed during the patient's care in the ED.      Uriah Trueba, 09/20/2019, DO 07/21/19 0617    Alexsandra Shontz, 09/20/19, DO 07/21/19 [phone]

## 2019-07-22 ENCOUNTER — Encounter: Payer: Self-pay | Admitting: Critical Care Medicine

## 2019-07-22 ENCOUNTER — Other Ambulatory Visit: Payer: Self-pay

## 2019-07-22 ENCOUNTER — Ambulatory Visit: Payer: Self-pay | Attending: Critical Care Medicine | Admitting: Critical Care Medicine

## 2019-07-22 VITALS — BP 130/82 | HR 74 | Temp 98.0°F | Ht 64.0 in | Wt 223.4 lb

## 2019-07-22 DIAGNOSIS — J0141 Acute recurrent pansinusitis: Secondary | ICD-10-CM | POA: Insufficient documentation

## 2019-07-22 DIAGNOSIS — R0982 Postnasal drip: Secondary | ICD-10-CM

## 2019-07-22 DIAGNOSIS — J4551 Severe persistent asthma with (acute) exacerbation: Secondary | ICD-10-CM

## 2019-07-22 DIAGNOSIS — J309 Allergic rhinitis, unspecified: Secondary | ICD-10-CM

## 2019-07-22 DIAGNOSIS — K029 Dental caries, unspecified: Secondary | ICD-10-CM

## 2019-07-22 MED ORDER — FLUTICASONE PROPIONATE 50 MCG/ACT NA SUSP: 2.0000 | Freq: Two times a day (BID) | NASAL | 3 refills | Status: DC

## 2019-07-22 MED ORDER — PANTOPRAZOLE SODIUM 40 MG PO TBEC: 40.0000 mg | DELAYED_RELEASE_TABLET | Freq: Every day | ORAL | 3 refills | Status: DC

## 2019-07-22 MED ORDER — DULERA 200-5 MCG/ACT IN AERO: 2.0000 | INHALATION_SPRAY | Freq: Two times a day (BID) | RESPIRATORY_TRACT | 6 refills | Status: DC

## 2019-07-22 MED ORDER — ATROVENT HFA 17 MCG/ACT IN AERS: 2.0000 | INHALATION_SPRAY | Freq: Four times a day (QID) | RESPIRATORY_TRACT | 12 refills | Status: DC

## 2019-07-22 MED ORDER — PREDNISONE 10 MG PO TABS: ORAL_TABLET | ORAL | 0 refills | Status: DC

## 2019-07-22 MED ORDER — INCRUSE ELLIPTA 62.5 MCG/INH IN AEPB: 1.0000 | INHALATION_SPRAY | Freq: Every day | RESPIRATORY_TRACT | 6 refills | Status: DC

## 2019-07-22 MED FILL — predniSONE 10 MG TABS: 10 | 20 days supply | Qty: 40 | Fill #0

## 2019-07-22 MED FILL — !INCRUSE ELLIPTA 62.5 MCG I: 62.5 | 30 days supply | Qty: 30 | Fill #0

## 2019-07-22 MED FILL — PANTOPRAZOLE SOD DR 40 MG T: 40 | 30 days supply | Qty: 30 | Fill #0

## 2019-07-22 MED FILL — DULERA 200 MCG/5 MCG INH: 200-5 | 25 days supply | Qty: 13 | Fill #0

## 2019-07-22 NOTE — Assessment & Plan Note (Signed)
Severe dental caries with periodontal disease likely contributing to recurrent airway infection  The patient is encouraged to seek dental care

## 2019-07-22 NOTE — Progress Notes (Signed)
asthma

## 2019-07-22 NOTE — Patient Instructions (Addendum)
When you finish your prednisone dosing now we will stay on 20 mg daily dose thereafter, more prednisone sent to our pharmacy  Begin Atrovent 2 inhalations 4 times daily  We will switch Advair to [facility] 2 inhalations twice daily  Increase Flonase to 2 sprays each nostril twice daily  Begin Protonix 1 daily  Follow reflux diet as outlined below  Allergy labs today  CT Chest and sinus xrays ordered   Return in 1 week with video visit

## 2019-07-22 NOTE — Progress Notes (Signed)
Subjective:    Patient ID: Wendy Morgan, female    DOB: 23-Feb-1976, 44 y.o.   MRN: [id number]  43 y.o.F  Ref by PCP for asthma recurrent and frequent ED visits.  Ave 10 in 2018 to now 20+ ED visits annually for asthma exac.  No pulm consultations or allergy visits as of yet. The patient has chronically elevated  absolute eosinophilia  This patient states she has been diagnosed with asthma since 2010.  She states she had mold issues in her home 2 years ago but now has new carpeting and the mold issue has been updated.  She changes her filters in her HVAC frequently.  She is never seen a lung doctor nor she had pulmonary function testing.  She has a peak flow meter her best ever is to 85 worse is less than 100   In one encounter in 2018 she was labeled as having bronchiectasis however all chest x-rays I have reviewed showed no active process she has not had a CT scan of the chest  Please review asthma assessment below  Asthma She complains of chest tightness, cough, difficulty breathing, frequent throat clearing, hoarse voice, shortness of breath, sputum production and wheezing. There is no hemoptysis. Primary symptoms comments: Mucus is green when it comes up. This is a chronic problem. The current episode started more than 1 year ago. The problem occurs constantly (when triggered will last 1-2 weeks). The problem has been rapidly worsening. The cough is productive of purulent sputum. Associated symptoms include appetite change, chest pain, dyspnea on exertion, heartburn, malaise/fatigue, nasal congestion, PND, postnasal drip, rhinorrhea and sneezing. Pertinent negatives include no ear congestion, ear pain, fever, headaches, myalgias, orthopnea, sore throat or trouble swallowing. Associated symptoms comments: Heartburn with prednisone Has qhs wheezing and asthma flare  Green nasal discharge . 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 Her symptoms are alleviated by oral steroids, beta-agonist and steroid inhaler (when end pred will get worse). Her past medical history is significant for asthma and bronchitis. There is no history of pneumonia.   Past Medical History:  Diagnosis Date  . Asthma   . Bronchiectasis with acute exacerbation (HCC)      Family History  Problem Relation Age of Onset  . Healthy Mother   . Healthy Father   . Kidney disease Neg Hx   . Anxiety disorder Neg Hx   . Depression Neg Hx      Social History   Socioeconomic History  . Marital status: Single    Spouse name: Not on file  . Number of children: Not on file  . Years of education: Not on file  . Highest education level: Not on file  Occupational History  . Not on file  Tobacco Use  . Smoking status: Former Smoker    Quit date: 1998    Years since quitting: 23.2  . Smokeless tobacco: Never Used  Substance and Sexual Activity  . Alcohol use: No  . Drug use: No  . Sexual activity: Yes    Birth control/protection: None  Other Topics Concern  . Not on file  Social History Narrative  . Not on file   Social Determinants of Health   Financial Resource Strain:   . Difficulty of Paying Living Expenses:   Food Insecurity:   . Worried About Charity fundraiser  in the Last Year:   . Ran Out of Food in the Last Year:   Transportation Needs:   . Freight forwarder (Medical):   Marland Kitchen Lack of Transportation (Non-Medical):   Physical Activity:   . Days of Exercise per Week:   . Minutes of Exercise per Session:   Stress:   . Feeling of Stress :   Social Connections:   . Frequency of Communication with Friends and Family:   . Frequency of Social Gatherings with Friends and Family:   . Attends Religious Services:   . Active Member of Clubs or Organizations:   . Attends Banker Meetings:   Marland Kitchen Marital Status:    Intimate Partner Violence:   . Fear of Current or Ex-Partner:   . Emotionally Abused:   Marland Kitchen Physically Abused:   . Sexually Abused:      No Known Allergies   Outpatient Medications Prior to Visit  Medication Sig Dispense Refill  . albuterol (VENTOLIN HFA) 108 (90 Base) MCG/ACT inhaler Inhale 2 puffs into the lungs every 4 (four) hours as needed for wheezing or shortness of breath. 17 g 0  . benzonatate (TESSALON) 100 MG capsule Take 1 capsule (100 mg total) by mouth every 8 (eight) hours. 21 capsule 0  . cetirizine (ZYRTEC) 10 MG tablet Take 1 tablet (10 mg total) by mouth daily. 30 tablet 11  . montelukast (SINGULAIR) 10 MG tablet Take 1 tablet (10 mg total) by mouth at bedtime. 30 tablet 2  . Olopatadine HCl 0.2 % SOLN Apply 1 drop to eye daily. 2.5 mL 0  . predniSONE (DELTASONE) 20 MG tablet Take 3 tablets (60 mg total) by mouth daily. 15 tablet 0  . albuterol (PROVENTIL HFA) 108 (90 Base) MCG/ACT inhaler Inhale 1-2 puffs into the lungs every 6 (six) hours as needed for wheezing or shortness of breath. 18 g 1  . fluticasone (FLONASE) 50 MCG/ACT nasal spray Place 2 sprays into both nostrils daily. 16 g 0  . Fluticasone-Salmeterol (ADVAIR DISKUS) 250-50 MCG/DOSE AEPB Inhale 1 puff into the lungs 2 (two) times daily. 60 each 6  . albuterol (VENTOLIN HFA) 108 (90 Base) MCG/ACT inhaler Inhale 2 puffs into the lungs every 4 (four) hours as needed for wheezing or shortness of breath. (Patient not taking: Reported on 07/22/2019) 18 g 1   No facility-administered medications prior to visit.      Review of Systems  Constitutional: Positive for appetite change and malaise/fatigue. Negative for fever.  HENT: Positive for hoarse voice, postnasal drip, rhinorrhea and sneezing. Negative for ear pain, sore throat and trouble swallowing.   Respiratory: Positive for cough, sputum production, shortness of breath and wheezing. Negative for hemoptysis.   Cardiovascular: Positive for chest pain, dyspnea on  exertion and PND.  Gastrointestinal: Positive for heartburn.  Musculoskeletal: Negative for myalgias.  Neurological: Negative for headaches.        Objective:   Physical Exam Vitals:   07/22/19 0843  BP: 130/82  Morgan: 74  Temp: 98 F (36.7 C)  TempSrc: Oral  SpO2: 98%  Weight: 223 lb 6.4 oz (101.3 kg)  Height: 5\' 4"  (1.626 m)    Gen: Pleasant, obese, in no distress,  normal affect  ENT: No lesions,  mouth clear,  oropharynx clear, 3+ postnasal drip, significant turbinate edema but no nasal purulence, multiple carious teeth with severe periodontal disease seen  Neck: No JVD, no TMG, no carotid bruits  Lungs: No use of accessory muscles, no dullness to percussion, expired  wheezes with prolonged expiratory phase no rhonchi no rales Cardiovascular: RRR, heart sounds normal, no murmur or gallops, no peripheral edema  Abdomen: soft epigastric tenderness noted without rebound or guarding, no HSM,  BS normal  Musculoskeletal: No deformities, no cyanosis or clubbing  Neuro: alert, non focal  Skin: Warm, no lesions or rashes  All labs and x-rays and CHL reviewed note there is significant eosinophilia on all CBC differential was reviewed      Assessment & Plan:  I personally reviewed all images and lab data in the [facility] system as well as any outside material available during this office visit and agree with the  radiology impressions.   Asthma exacerbation Severe persistent asthma with recurrent exacerbations and frequent ER utilization  I suspect this patient is significantly atopic has elevated IgE levels and likely multiple triggers.  Patient also has significant reflux disease and poor dentition.  I am concerned about chronic recurrent sinusitis in this patient as well.  She has baseline eosinophilia  The patient may benefit from a molecular biological  The patient would benefit from improved dental hygiene and removal of several carious teeth  Plan Will begin Dulera  2 inhalations twice daily and obtain patient assistance Will taper prednisone down to 20 mg a day and hold We will begin Protonix 40 mg daily and follow-up with her reflux diet We will continue Singulair 10 mg daily We will begin Incruse 1 inhalation daily  We will obtain allergen mold profile food allergies and IgE level sinus x-rays and CT scan of chest and eosinophil count  Acute recurrent pansinusitis I am concerned about acute recurrent sinusitis and for this will obtain sinus films and increase Flonase to 2 sprays each nostril twice daily  Dental caries Severe dental caries with periodontal disease likely contributing to recurrent airway infection  The patient is encouraged to seek dental care   Diagnoses and all orders for this visit:  Severe persistent asthma with exacerbation -     Allergen Profile, Mold -     Eosinophil count -     IgE -     Food Allergy Profile -     CT Chest Wo Contrast; Future -     DG Sinuses Complete; Future  Allergic rhinitis with postnasal drip  Acute recurrent pansinusitis  Dental caries  Other orders -     Discontinue: ipratropium (ATROVENT HFA) 17 MCG/ACT inhaler; Inhale 2 puffs into the lungs every 6 (six) hours. -     mometasone-formoterol (DULERA) 200-5 MCG/ACT AERO; Inhale 2 puffs into the lungs 2 (two) times daily. -     predniSONE (DELTASONE) 10 MG tablet; Take two daily when current prednisone dosing runs out -     fluticasone (FLONASE) 50 MCG/ACT nasal spray; Place 2 sprays into both nostrils in the morning and at bedtime. -     pantoprazole (PROTONIX) 40 MG tablet; Take 1 tablet (40 mg total) by mouth daily. -     umeclidinium bromide (INCRUSE ELLIPTA) 62.5 MCG/INH AEPB; Inhale 1 puff into the lungs daily.

## 2019-07-22 NOTE — Assessment & Plan Note (Signed)
I am concerned about acute recurrent sinusitis and for this will obtain sinus films and increase Flonase to 2 sprays each nostril twice daily

## 2019-07-22 NOTE — Assessment & Plan Note (Signed)
Severe persistent asthma with recurrent exacerbations and frequent ER utilization  I suspect this patient is significantly atopic has elevated IgE levels and likely multiple triggers.  Patient also has significant reflux disease and poor dentition.  I am concerned about chronic recurrent sinusitis in this patient as well.  She has baseline eosinophilia  The patient may benefit from a molecular biological  The patient would benefit from improved dental hygiene and removal of several carious teeth  Plan Will begin Dulera 2 inhalations twice daily and obtain patient assistance Will taper prednisone down to 20 mg a day and hold We will begin Protonix 40 mg daily and follow-up with her reflux diet We will continue Singulair 10 mg daily We will begin Incruse 1 inhalation daily  We will obtain allergen mold profile food allergies and IgE level sinus x-rays and CT scan of chest and eosinophil count

## 2019-07-24 LAB — ALLERGEN PROFILE, MOLD
Alternaria Alternata IgE: <0.1 kU/L
Aspergillus Fumigatus IgE: <0.1 kU/L
Aureobasidi Pullulans IgE: <0.1 kU/L
Candida Albicans IgE: <0.1 kU/L
Cladosporium Herbarum IgE: <0.1 kU/L
M009-IgE Fusarium proliferatum: <0.1 kU/L
M014-IgE Epicoccum purpur: <0.1 kU/L
Mucor Racemosus IgE: <0.1 kU/L
Penicillium Chrysogen IgE: <0.1 kU/L
Phoma Betae IgE: <0.1 kU/L
Setomelanomma Rostrat: <0.1 kU/L
Stemphylium Herbarum IgE: <0.1 kU/L

## 2019-07-24 LAB — FOOD ALLERGY PROFILE
Allergen Corn, IgE: <0.1 kU/L
Clam IgE: <0.1 kU/L
Codfish IgE: <0.1 kU/L
Egg White IgE: 0.28 kU/L — AB
Milk IgE: <0.1 kU/L
Peanut IgE: <0.1 kU/L
Scallop IgE: <0.1 kU/L
Sesame Seed IgE: <0.1 kU/L
Shrimp IgE: <0.1 kU/L
Soybean IgE: <0.1 kU/L
Walnut IgE: <0.1 kU/L
Wheat IgE: <0.1 kU/L

## 2019-07-24 LAB — IGE: IgE (Immunoglobulin E), Serum: 67 IU/mL (ref 6–495)

## 2019-07-24 LAB — EOSINOPHIL COUNT: EOS (ABSOLUTE): 0.3 10*3/uL (ref 0.0–0.4)

## 2019-07-25 ENCOUNTER — Ambulatory Visit (HOSPITAL_COMMUNITY)
Admission: RE | Admit: 2019-07-25 | Discharge: 2019-07-25 | Disposition: A | Discharge status: Home / Self Care | Payer: Self-pay | Source: Ambulatory Visit | Attending: Critical Care Medicine | Admitting: Critical Care Medicine

## 2019-07-25 ENCOUNTER — Other Ambulatory Visit: Payer: Self-pay

## 2019-07-25 ENCOUNTER — Other Ambulatory Visit: Payer: Self-pay | Admitting: Critical Care Medicine

## 2019-07-25 DIAGNOSIS — J4551 Severe persistent asthma with (acute) exacerbation: Secondary | ICD-10-CM | POA: Insufficient documentation

## 2019-07-25 DIAGNOSIS — J0101 Acute recurrent maxillary sinusitis: Secondary | ICD-10-CM | POA: Insufficient documentation

## 2019-07-25 MED ORDER — AMOXICILLIN-POT CLAVULANATE 875-125 MG PO TABS: 1.0000 | ORAL_TABLET | Freq: Two times a day (BID) | ORAL | 0 refills | Status: AC

## 2019-07-25 NOTE — Progress Notes (Signed)
Sinus xray + sinusitis

## 2019-08-18 ENCOUNTER — Other Ambulatory Visit: Payer: Self-pay

## 2019-08-18 ENCOUNTER — Ambulatory Visit: Payer: Self-pay | Attending: Nurse Practitioner

## 2019-09-09 ENCOUNTER — Other Ambulatory Visit: Payer: Self-pay

## 2019-09-09 ENCOUNTER — Encounter (HOSPITAL_COMMUNITY): Payer: Self-pay | Admitting: *Deleted

## 2019-09-09 DIAGNOSIS — R197 Diarrhea, unspecified: Secondary | ICD-10-CM | POA: Diagnosis present

## 2019-09-09 DIAGNOSIS — J45909 Unspecified asthma, uncomplicated: Secondary | ICD-10-CM | POA: Diagnosis not present

## 2019-09-09 DIAGNOSIS — U071 COVID-19: Secondary | ICD-10-CM | POA: Diagnosis not present

## 2019-09-09 DIAGNOSIS — R509 Fever, unspecified: Secondary | ICD-10-CM | POA: Insufficient documentation

## 2019-09-09 LAB — URINALYSIS, ROUTINE W REFLEX MICROSCOPIC
Bilirubin Urine: NEGATIVE
Glucose, UA: NEGATIVE mg/dL
Hgb urine dipstick: NEGATIVE
Ketones, ur: 5 mg/dL — AB
Nitrite: NEGATIVE
Protein, ur: 100 mg/dL — AB
Specific Gravity, Urine: 1.032 — ABNORMAL HIGH (ref 1.005–1.030)
pH: 5 (ref 5.0–8.0)

## 2019-09-09 LAB — CBC
HCT: 42.7 % (ref 36.0–46.0)
Hemoglobin: 14.4 g/dL (ref 12.0–15.0)
MCH: 29.1 pg (ref 26.0–34.0)
MCHC: 33.7 g/dL (ref 30.0–36.0)
MCV: 86.3 fL (ref 80.0–100.0)
Platelets: 229 10*3/uL (ref 150–400)
RBC: 4.95 MIL/uL (ref 3.87–5.11)
RDW: 13.6 % (ref 11.5–15.5)
WBC: 6.4 10*3/uL (ref 4.0–10.5)
nRBC: 0 % (ref 0.0–0.2)

## 2019-09-09 LAB — I-STAT BETA HCG BLOOD, ED (MC, WL, AP ONLY): I-stat hCG, quantitative: <5 m[IU]/mL (ref <5)

## 2019-09-09 MED ORDER — SODIUM CHLORIDE 0.9% FLUSH: 3.0000 mL | Freq: Once | INTRAVENOUS | Status: DC

## 2019-09-09 NOTE — ED Triage Notes (Signed)
Diarrhea, loss of appetite, cold chills and headache. Symptoms x 1 week.

## 2019-09-10 ENCOUNTER — Emergency Department (HOSPITAL_COMMUNITY)
Admission: EM | Admit: 2019-09-10 | Discharge: 2019-09-10 | Disposition: A | Discharge status: Home / Self Care | Payer: HRSA Program | Attending: Emergency Medicine | Admitting: Emergency Medicine

## 2019-09-10 ENCOUNTER — Telehealth (HOSPITAL_COMMUNITY): Payer: Self-pay

## 2019-09-10 DIAGNOSIS — R197 Diarrhea, unspecified: Secondary | ICD-10-CM

## 2019-09-10 LAB — LIPASE, BLOOD: Lipase: 27 U/L (ref 11–51)

## 2019-09-10 LAB — COMPREHENSIVE METABOLIC PANEL
ALT: 15 U/L (ref 0–44)
AST: 27 U/L (ref 15–41)
Albumin: 3.7 g/dL (ref 3.5–5.0)
Alkaline Phosphatase: 57 U/L (ref 38–126)
Anion gap: 11 (ref 5–15)
BUN: 11 mg/dL (ref 6–20)
CO2: 26 mmol/L (ref 22–32)
Calcium: 8 mg/dL — ABNORMAL LOW (ref 8.9–10.3)
Chloride: 100 mmol/L (ref 98–111)
Creatinine, Ser: 1.02 mg/dL — ABNORMAL HIGH (ref 0.44–1.00)
GFR calc Af Amer: >60 mL/min (ref >60)
GFR calc non Af Amer: >60 mL/min (ref >60)
Glucose, Bld: 102 mg/dL — ABNORMAL HIGH (ref 70–99)
Potassium: 3.1 mmol/L — ABNORMAL LOW (ref 3.5–5.1)
Sodium: 137 mmol/L (ref 135–145)
Total Bilirubin: 0.4 mg/dL (ref 0.3–1.2)
Total Protein: 7.2 g/dL (ref 6.5–8.1)

## 2019-09-10 LAB — SARS CORONAVIRUS 2 (TAT 6-24 HRS): SARS Coronavirus 2: POSITIVE — AB

## 2019-09-10 NOTE — ED Provider Notes (Signed)
[facility]-EMERGENCY DEPT Provider Note   CSN: [id number] Arrival date & time: 09/09/19  2159     History Chief Complaint  Patient presents with  . Diarrhea    Wendy Morgan is a 44 y.o. female.  The history is provided by the patient.  Diarrhea Quality:  Watery Severity:  Mild Onset quality:  Gradual Timing:  Intermittent Relieved by:  Nothing Worsened by:  Nothing Associated symptoms: fever   Associated symptoms: no abdominal pain, no arthralgias, no chills, no recent cough, no diaphoresis, no headaches, no myalgias, no URI and no vomiting   Risk factors: sick contacts   Risk factors: no recent antibiotic use and no suspicious food intake        Past Medical History:  Diagnosis Date  . Asthma   . Bronchiectasis with acute exacerbation [facility])     Patient Active Problem List   Diagnosis Date Noted  . Acute recurrent maxillary sinusitis 07/25/2019  . Allergic rhinitis with postnasal drip 07/22/2019  . Acute recurrent pansinusitis 07/22/2019  . Dental caries 07/22/2019  . Asthma exacerbation 05/29/2018    Past Surgical History:  Procedure Laterality Date  . TUBAL LIGATION       OB History   No obstetric history on file.     Family History  Problem Relation Age of Onset  . Healthy Mother   . Healthy Father   . Kidney disease Neg Hx   . Anxiety disorder Neg Hx   . Depression Neg Hx     Social History   Tobacco Use  . Smoking status: Former Smoker    Quit date: 1998    Years since quitting: 23.4  . Smokeless tobacco: Never Used  Substance Use Topics  . Alcohol use: No  . Drug use: No    Home Medications Prior to Admission medications   Medication Sig Start Date End Date Taking? Authorizing Provider  albuterol (VENTOLIN HFA) 108 (90 Base) MCG/ACT inhaler Inhale 2 puffs into the lungs every 4 (four) hours as needed for wheezing or shortness of breath. 06/25/19   Mancel Bale, MD  benzonatate (TESSALON) 100 MG capsule  Take 1 capsule (100 mg total) by mouth every 8 (eight) hours. 07/08/19   Joy, Shawn C, PA-C  cetirizine (ZYRTEC) 10 MG tablet Take 1 tablet (10 mg total) by mouth daily. 08/05/18   Claiborne Rigg, NP  fluticasone (FLONASE) 50 MCG/ACT nasal spray Place 2 sprays into both nostrils in the morning and at bedtime. 07/22/19   Storm Frisk, MD  mometasone-formoterol (DULERA) 200-5 MCG/ACT AERO Inhale 2 puffs into the lungs 2 (two) times daily. 07/22/19   Storm Frisk, MD  montelukast (SINGULAIR) 10 MG tablet Take 1 tablet (10 mg total) by mouth at bedtime. 06/02/19 08/31/19  Claiborne Rigg, NP  Olopatadine HCl 0.2 % SOLN Apply 1 drop to eye daily. 04/07/19   Mardella Layman, MD  pantoprazole (PROTONIX) 40 MG tablet Take 1 tablet (40 mg total) by mouth daily. 07/22/19   Storm Frisk, MD  predniSONE (DELTASONE) 10 MG tablet Take two daily when current prednisone dosing runs out 07/22/19   Storm Frisk, MD  predniSONE (DELTASONE) 20 MG tablet Take 3 tablets (60 mg total) by mouth daily. 07/21/19   Ward, Layla Maw, DO  umeclidinium bromide (INCRUSE ELLIPTA) 62.5 MCG/INH AEPB Inhale 1 puff into the lungs daily. 07/22/19   Storm Frisk, MD    Allergies    Patient has no known allergies.  Review of Systems   Review of Systems  Constitutional: Positive for fever. Negative for chills and diaphoresis.  HENT: Negative for ear pain and sore throat.   Eyes: Negative for pain and visual disturbance.  Respiratory: Negative for cough and shortness of breath.   Cardiovascular: Negative for chest pain and palpitations.  Gastrointestinal: Positive for diarrhea. Negative for abdominal pain and vomiting.  Genitourinary: Negative for dysuria and hematuria.  Musculoskeletal: Negative for arthralgias, back pain and myalgias.  Skin: Negative for color change and rash.  Neurological: Negative for seizures, syncope and headaches.  All other systems reviewed and are negative.   Physical Exam Updated Vital  Signs BP 116/77 (BP Location: Left Arm)   Pulse 85   Temp (!) 100.5 F (38.1 C)   Resp 16   SpO2 98%   Physical Exam Vitals and nursing note reviewed.  Constitutional:      General: She is not in acute distress.    Appearance: She is well-developed. She is not ill-appearing.  HENT:     Head: Normocephalic and atraumatic.     Nose: Nose normal.     Mouth/Throat:     Mouth: Mucous membranes are moist.  Eyes:     Extraocular Movements: Extraocular movements intact.     Conjunctiva/sclera: Conjunctivae normal.     Pupils: Pupils are equal, round, and reactive to light.  Cardiovascular:     Rate and Rhythm: Normal rate and regular rhythm.     Pulses: Normal pulses.     Heart sounds: Normal heart sounds. No murmur.  Pulmonary:     Effort: Pulmonary effort is normal. No respiratory distress.     Breath sounds: Normal breath sounds.  Abdominal:     Palpations: Abdomen is soft.     Tenderness: There is no abdominal tenderness.  Musculoskeletal:     Cervical back: Neck supple.  Skin:    General: Skin is warm and dry.     Capillary Refill: Capillary refill takes less than 2 seconds.  Neurological:     General: No focal deficit present.     Mental Status: She is alert.     ED Results / Procedures / Treatments   Labs (all labs ordered are listed, but only abnormal results are displayed) Labs Reviewed  COMPREHENSIVE METABOLIC PANEL - Abnormal; Notable for the following components:      Result Value   Potassium 3.1 (*)    Glucose, Bld 102 (*)    Creatinine, Ser 1.02 (*)    Calcium 8.0 (*)    All other components within normal limits  URINALYSIS, ROUTINE W REFLEX MICROSCOPIC - Abnormal; Notable for the following components:   Color, Urine AMBER (*)    APPearance CLOUDY (*)    Specific Gravity, Urine 1.032 (*)    Ketones, ur 5 (*)    Protein, ur 100 (*)    Leukocytes,Ua LARGE (*)    Bacteria, UA RARE (*)    All other components within normal limits  SARS CORONAVIRUS 2  (TAT 6-24 HRS)  LIPASE, BLOOD  CBC  I-STAT BETA HCG BLOOD, ED ([facility], [facility], [facility] ONLY)    EKG None  Radiology No results found.  Procedures Procedures (including critical care time)  Medications Ordered in ED Medications  sodium chloride flush (NS) 0.9 % injection 3 mL (has no administration in time range)    ED Course  I have reviewed the triage vital signs and the nursing notes.  Pertinent labs & imaging results that were available during  my care of the patient were reviewed by me and considered in my medical decision making (see chart for details).    MDM Rules/Calculators/A&P                      Wendy Morgan is a 44 year old female who presents to the ED with diarrhea. Patient with low-grade fever, otherwise normal vitals. Diarrhea for the last several days. Daughter sick with Covid type symptoms. No suspicious food intake. No recent antibiotic use. No abdominal pain. No nausea or vomiting. Lab work showed no significant anemia, electrolyte abnormality, kidney injury. Patient with no abdominal tenderness on exam. Appears well-hydrated. Has been able to tolerate fluids. Will test for coronavirus given daughter sick with similar symptoms. Suspect she has more GI related symptoms from virus either foodborne or Covid or other virus. Given reassurance. Given return precautions and discharged in ED in good condition. Patient with no respiratory symptoms.  This chart was dictated using voice recognition software.  Despite best efforts to proofread,  errors can occur which can change the documentation meaning.  Wendy Morgan was evaluated in Emergency Department on 09/10/2019 for the symptoms described in the history of present illness. She was evaluated in the context of the global COVID-19 pandemic, which necessitated consideration that the patient might be at risk for infection with the SARS-CoV-2 virus that causes COVID-19. Institutional protocols and algorithms that pertain to the  evaluation of patients at risk for COVID-19 are in a state of rapid change based on information released by regulatory bodies including the CDC and federal and state organizations. These policies and algorithms were followed during the patient's care in the ED.    Final Clinical Impression(s) / ED Diagnoses Final diagnoses:  Diarrhea, unspecified type    Rx / DC Orders ED Discharge Orders    None       Virgina Norfolk, DO 09/10/19 0092

## 2019-09-11 ENCOUNTER — Institutional Professional Consult (permissible substitution): Payer: Self-pay | Admitting: Emergency Medicine

## 2019-09-11 ENCOUNTER — Telehealth (HOSPITAL_COMMUNITY): Payer: Self-pay

## 2019-09-11 ENCOUNTER — Telehealth: Payer: Self-pay | Admitting: Unknown Physician Specialty

## 2019-09-11 NOTE — Telephone Encounter (Signed)
Called to discuss with patient about Covid symptoms and the use of bamlanivimab, a monoclonal antibody infusion for those with mild to moderate Covid symptoms and at a high risk of hospitalization.  Pt is qualified for this infusion at the [facility] infusion center due to BMI>35   Unable to leave message as phone number not a working number.  Will send mychart message

## 2019-09-12 ENCOUNTER — Telehealth: Payer: Self-pay

## 2019-12-22 ENCOUNTER — Other Ambulatory Visit: Payer: Self-pay

## 2019-12-24 ENCOUNTER — Other Ambulatory Visit: Payer: Self-pay | Admitting: Nurse Practitioner

## 2019-12-24 ENCOUNTER — Encounter: Payer: Self-pay | Admitting: Nurse Practitioner

## 2019-12-24 DIAGNOSIS — J4521 Mild intermittent asthma with (acute) exacerbation: Secondary | ICD-10-CM

## 2019-12-24 MED ORDER — ALBUTEROL SULFATE HFA 108 (90 BASE) MCG/ACT IN AERS: 2.0000 | INHALATION_SPRAY | RESPIRATORY_TRACT | 0 refills | Status: DC | PRN

## 2019-12-24 MED ORDER — MONTELUKAST SODIUM 10 MG PO TABS: 10.0000 mg | ORAL_TABLET | Freq: Every day | ORAL | 2 refills | Status: DC

## 2019-12-24 MED ORDER — BENZONATATE 100 MG PO CAPS: 100.0000 mg | ORAL_CAPSULE | Freq: Three times a day (TID) | ORAL | 0 refills | Status: DC

## 2019-12-24 MED ORDER — INCRUSE ELLIPTA 62.5 MCG/INH IN AEPB: 1.0000 | INHALATION_SPRAY | Freq: Every day | RESPIRATORY_TRACT | 6 refills | Status: DC

## 2019-12-24 MED ORDER — DULERA 200-5 MCG/ACT IN AERO: 2.0000 | INHALATION_SPRAY | Freq: Two times a day (BID) | RESPIRATORY_TRACT | 1 refills | Status: DC

## 2019-12-24 MED ORDER — PANTOPRAZOLE SODIUM 40 MG PO TBEC: 40.0000 mg | DELAYED_RELEASE_TABLET | Freq: Every day | ORAL | 3 refills | Status: DC

## 2019-12-24 MED FILL — INCRUSE ELLIPTA 62.5 MCG IN: 62.5 | 30 days supply | Qty: 30 | Fill #0

## 2019-12-24 MED FILL — !DULERA 200 MCG/5 MCG INH: 200-5 | 30 days supply | Qty: 13 | Fill #0

## 2019-12-24 MED FILL — PANTOPRAZOLE SOD DR 40 MG T: 40 | 30 days supply | Qty: 30 | Fill #0

## 2019-12-24 MED FILL — BENZONATATE 100 MG CAPS: 100 | 7 days supply | Qty: 21 | Fill #0

## 2019-12-24 MED FILL — MONTELUKAST SOD 10 MG TAB: 10 | 30 days supply | Qty: 30 | Fill #0

## 2019-12-25 MED FILL — !VENTOLIN HFA INHALER: 108 (90 BAS | 25 days supply | Qty: 18 | Fill #0

## 2020-01-19 ENCOUNTER — Institutional Professional Consult (permissible substitution): Payer: Self-pay | Admitting: Internal Medicine

## 2020-01-19 NOTE — Progress Notes (Deleted)
         Wendy Morgan    [id number]    1976-03-07  Primary Care Physician:Fleming, Shea Stakes, NP  Referring Physician: Claiborne Rigg, NP [address] [place],  [place] [address] Reason for Consultation:  Date of Consultation: 01/19/2020  Chief complaint:  No chief complaint on file.    HPI:  *** Social history:  Occupation: Exposures: Smoking history:  Social History   Occupational History  . Not on file  Tobacco Use  . Smoking status: Former Smoker    Quit date: 1998    Years since quitting: 23.7  . Smokeless tobacco: Never Used  Vaping Use  . Vaping Use: Never used  Substance and Sexual Activity  . Alcohol use: No  . Drug use: No  . Sexual activity: Yes    Birth control/protection: None    Relevant family history: *** Family History  Problem Relation Age of Onset  . Healthy Mother   . Healthy Father   . Kidney disease Neg Hx   . Anxiety disorder Neg Hx   . Depression Neg Hx     Past Medical History:  Diagnosis Date  . Asthma   . Bronchiectasis with acute exacerbation [facility])     Past Surgical History:  Procedure Laterality Date  . TUBAL LIGATION       Physical Exam: There were no vitals taken for this visit. Gen:      No acute distress ENT:  ***no nasal polyps, mucus membranes moist Lungs:    No increased respiratory effort, symmetric chest wall excursion, clear to auscultation bilaterally, ***no wheezes or crackles CV:         Regular rate and rhythm; no murmurs, rubs, or gallops.  No pedal edema Abd:      + bowel sounds; soft, non-tender; no distension MSK: no acute synovitis of DIP or PIP joints, no mechanics hands.  Skin:      Warm and dry; no rashes*** Neuro: normal speech, no focal facial asymmetry Psych: alert and oriented x3, normal mood and affect   Data Reviewed/Medical Decision Making:  Independent interpretation of tests: Imaging: . Review of patient's *** images revealed ***. The patient's images have been  independently reviewed by me.    PFTs: I have personally reviewed the patient's PFTs and *** No flowsheet data found.  Labs: ***  Immunization status:   There is no immunization history on file for this patient.  . I reviewed prior external note(s) from *** . I reviewed the result(s) of the labs and imaging as noted above.  . I have ordered ***  Discussion of management or test interpretation with another colleague ***.   Assessment:  ***. 1 or more chronic illnesses with severe exacerbation, progression, or side effects of treatment;  ***. 1 acute or chronic illness or injury that poses a threat to life or bodily function  Plan/Recommendations:   We discussed disease management and progression at length today.   I spent *** minutes in the care of this patient today including pre-charting, chart review, review of results, face-to-face care, coordination of care and communication with consultants etc.).  99202  15-29 minutes or Straightforward MDM  [id number] 30-44 minutes or Low level MDM  [id number] 45-59 minutes or Moderate level MDM  21308 60-74 minutes or High level MDM   Return to Care: No follow-ups on file.  Durel Salts, MD Pulmonary and Critical Care Medicine [facility] Office:[phone]  CC: Claiborne Rigg, NP

## 2020-03-26 ENCOUNTER — Other Ambulatory Visit: Payer: Self-pay | Admitting: Nurse Practitioner

## 2020-03-29 ENCOUNTER — Other Ambulatory Visit: Payer: Self-pay | Admitting: Nurse Practitioner

## 2020-04-02 MED FILL — ALBUTEROL SULFATE HFA 108 (: 108 (90 BAS | 25 days supply | Qty: 18 | Fill #0

## 2020-04-12 ENCOUNTER — Emergency Department (HOSPITAL_COMMUNITY): Payer: Self-pay

## 2020-04-12 ENCOUNTER — Other Ambulatory Visit: Payer: Self-pay

## 2020-04-12 ENCOUNTER — Encounter (HOSPITAL_COMMUNITY): Payer: Self-pay | Admitting: Obstetrics and Gynecology

## 2020-04-12 ENCOUNTER — Emergency Department (HOSPITAL_COMMUNITY)
Admission: EM | Admit: 2020-04-12 | Discharge: 2020-04-13 | Disposition: A | Discharge status: Home / Self Care | Payer: Self-pay | Attending: Emergency Medicine | Admitting: Emergency Medicine

## 2020-04-12 DIAGNOSIS — Z87891 Personal history of nicotine dependence: Secondary | ICD-10-CM | POA: Insufficient documentation

## 2020-04-12 DIAGNOSIS — J45901 Unspecified asthma with (acute) exacerbation: Secondary | ICD-10-CM | POA: Insufficient documentation

## 2020-04-12 DIAGNOSIS — Z7951 Long term (current) use of inhaled steroids: Secondary | ICD-10-CM | POA: Insufficient documentation

## 2020-04-12 DIAGNOSIS — J4541 Moderate persistent asthma with (acute) exacerbation: Secondary | ICD-10-CM

## 2020-04-12 MED ORDER — ALBUTEROL SULFATE HFA 108 (90 BASE) MCG/ACT IN AERS
2.0000 | INHALATION_SPRAY | RESPIRATORY_TRACT | Status: DC | PRN
  Administered 2020-04-12 (×2): 2 via RESPIRATORY_TRACT
  Filled 2020-04-12 (×2): qty 6.7

## 2020-04-12 MED ORDER — IPRATROPIUM BROMIDE 0.02 % IN SOLN
0.5000 mg | Freq: Once | RESPIRATORY_TRACT | Status: AC
  Administered 2020-04-12: 0.5 mg via RESPIRATORY_TRACT
  Filled 2020-04-12: qty 2.5

## 2020-04-12 MED ORDER — ALBUTEROL SULFATE (2.5 MG/3ML) 0.083% IN NEBU
5.0000 mg | INHALATION_SOLUTION | Freq: Once | RESPIRATORY_TRACT | Status: AC
  Administered 2020-04-12: 5 mg via RESPIRATORY_TRACT
  Filled 2020-04-12: qty 6

## 2020-04-12 MED ORDER — DEXAMETHASONE 4 MG PO TABS
10.0000 mg | ORAL_TABLET | Freq: Once | ORAL | Status: AC
  Administered 2020-04-12: 10 mg via ORAL
  Filled 2020-04-12: qty 2

## 2020-04-12 NOTE — ED Provider Notes (Signed)
[facility]-EMERGENCY DEPT Provider Note   CSN: [id number] Arrival date & time: 04/12/20  1920     History No chief complaint on file.   Wendy Morgan is a 44 y.o. female.  Patient with history of asthma presents with uncontrolled, progressively worsening wheezing and SOB over the last 2-3 days. No fever, chest pain, vomiting. She is COVID vaccinated. She reports using her regular medications, including Singulair and Albuterol inhaler without any relief. She feels her symptoms are similar to previous asthma exacerbations.   The history is provided by the patient. No language interpreter was used.       Past Medical History:  Diagnosis Date  . Asthma   . Bronchiectasis with acute exacerbation [facility])     Patient Active Problem List   Diagnosis Date Noted  . Acute recurrent maxillary sinusitis 07/25/2019  . Allergic rhinitis with postnasal drip 07/22/2019  . Acute recurrent pansinusitis 07/22/2019  . Dental caries 07/22/2019  . Asthma exacerbation 05/29/2018    Past Surgical History:  Procedure Laterality Date  . TUBAL LIGATION       OB History   No obstetric history on file.     Family History  Problem Relation Age of Onset  . Healthy Mother   . Healthy Father   . Kidney disease Neg Hx   . Anxiety disorder Neg Hx   . Depression Neg Hx     Social History   Tobacco Use  . Smoking status: Former Smoker    Quit date: 1998    Years since quitting: 24.0  . Smokeless tobacco: Never Used  Vaping Use  . Vaping Use: Never used  Substance Use Topics  . Alcohol use: No  . Drug use: No    Home Medications Prior to Admission medications   Medication Sig Start Date End Date Taking? Authorizing Provider  albuterol (VENTOLIN HFA) 108 (90 Base) MCG/ACT inhaler Inhale 2 puffs into the lungs every 4 (four) hours as needed for wheezing or shortness of breath. 12/24/19   Claiborne Rigg, NP  benzonatate (TESSALON) 100 MG capsule Take 1 capsule  (100 mg total) by mouth every 8 (eight) hours. 12/24/19   Claiborne Rigg, NP  cetirizine (ZYRTEC) 10 MG tablet Take 1 tablet (10 mg total) by mouth daily. 08/05/18   Claiborne Rigg, NP  fluticasone (FLONASE) 50 MCG/ACT nasal spray Place 2 sprays into both nostrils in the morning and at bedtime. 07/22/19   Storm Frisk, MD  mometasone-formoterol (DULERA) 200-5 MCG/ACT AERO Inhale 2 puffs into the lungs 2 (two) times daily. 12/24/19   Claiborne Rigg, NP  montelukast (SINGULAIR) 10 MG tablet Take 1 tablet (10 mg total) by mouth at bedtime. 12/24/19 03/23/20  Claiborne Rigg, NP  Olopatadine HCl 0.2 % SOLN Apply 1 drop to eye daily. 04/07/19   Mardella Layman, MD  pantoprazole (PROTONIX) 40 MG tablet Take 1 tablet (40 mg total) by mouth daily. 12/24/19   Claiborne Rigg, NP  predniSONE (DELTASONE) 10 MG tablet Take two daily when current prednisone dosing runs out 07/22/19   Storm Frisk, MD  predniSONE (DELTASONE) 20 MG tablet Take 3 tablets (60 mg total) by mouth daily. 07/21/19   Ward, Layla Maw, DO  umeclidinium bromide (INCRUSE ELLIPTA) 62.5 MCG/INH AEPB Inhale 1 puff into the lungs daily. 12/24/19   Claiborne Rigg, NP    Allergies    Patient has no known allergies.  Review of Systems   Review of Systems  Constitutional: Negative for chills and fever.  HENT: Negative.   Respiratory: Positive for shortness of breath and wheezing.   Cardiovascular: Negative.  Negative for chest pain.  Gastrointestinal: Negative.  Negative for nausea.  Musculoskeletal: Negative.  Negative for myalgias.  Skin: Negative.  Negative for rash.  Neurological: Negative.  Negative for weakness and light-headedness.    Physical Exam Updated Vital Signs BP (!) 143/99 (BP Location: Right Arm)   Pulse 85   Temp 97.8 F (36.6 C) (Oral)   Resp 20   Ht 5\' 4"  (1.626 m)   Wt 101.2 kg   LMP 03/31/2020 (Approximate)   SpO2 97%   BMI 38.28 kg/m   Physical Exam Vitals and nursing note reviewed.  Constitutional:       General: She is not in acute distress.    Appearance: Normal appearance. She is well-developed and well-nourished.  HENT:     Head: Normocephalic.  Eyes:     Conjunctiva/sclera: Conjunctivae normal.  Cardiovascular:     Rate and Rhythm: Normal rate and regular rhythm.     Heart sounds: No murmur heard.   Pulmonary:     Effort: Pulmonary effort is normal.     Breath sounds: Wheezing present.     Comments: Decreased air movement bilaterally with prolonged expirations. Abdominal:     General: Bowel sounds are normal.     Palpations: Abdomen is soft.     Tenderness: There is no abdominal tenderness. There is no guarding or rebound.  Musculoskeletal:        General: Normal range of motion.     Cervical back: Normal range of motion and neck supple.  Skin:    General: Skin is warm and dry.     Findings: No rash.  Neurological:     Mental Status: She is alert.     Cranial Nerves: No cranial nerve deficit.  Psychiatric:        Mood and Affect: Mood and affect normal.     ED Results / Procedures / Treatments   Labs (all labs ordered are listed, but only abnormal results are displayed) Labs Reviewed - No data to display  EKG None  Radiology DG Chest 2 View  Result Date: 04/12/2020 CLINICAL DATA:  Asthma EXAM: CHEST - 2 VIEW.  Slightly rotated patient on frontal view. COMPARISON:  Chest x-ray 07/20/2019, CT chest 07/25/2019 FINDINGS: The heart size and mediastinal contours are unchanged. No focal consolidation. No pulmonary edema. No pleural effusion. No pneumothorax. No acute osseous abnormality. IMPRESSION: No active cardiopulmonary disease. Electronically Signed   By: 09/24/2019 M.D.   On: 04/12/2020 19:58    Procedures Procedures (including critical care time)  Medications Ordered in ED Medications  albuterol (VENTOLIN HFA) 108 (90 Base) MCG/ACT inhaler 2 puff (2 puffs Inhalation Given 04/12/20 2046)  albuterol (PROVENTIL) (2.5 MG/3ML) 0.083% nebulizer solution  5 mg (has no administration in time range)  ipratropium (ATROVENT) nebulizer solution 0.5 mg (has no administration in time range)  dexamethasone (DECADRON) tablet 10 mg (has no administration in time range)    ED Course  I have reviewed the triage vital signs and the nursing notes.  Pertinent labs & imaging results that were available during my care of the patient were reviewed by me and considered in my medical decision making (see chart for details).    MDM Rules/Calculators/A&P                         Patient  to ED with what feels like asthma exacerbation. No fever, CXR normal, COVID vaccinated. Doubt infection.   On initial exam, there is significantly decreased air movement and prolonged expirations. No hypoxia, tachycardia or tachypnea. She appears comfortable.   Neb treatments, decadron initiated. After 2 back-to-back treatments she is improved but still has air movement restriction. She feels "50% better". Will do 3rd treatment.   Patient significantly improved after 3rd nebulized treatment. She feels comfortable with discharge home. She has a nebulizer machine at home - will provide Rx for neb meds. Inhaler provided in the ED.   Final Clinical Impression(s) / ED Diagnoses Final diagnoses:  None   1. Asthma exacerbation   Rx / DC Orders ED Discharge Orders    None       Danne Harbor 04/13/20 1856    Tegeler, Canary Brim, MD 04/13/20 1501

## 2020-04-12 NOTE — ED Triage Notes (Signed)
Patient reports to the ER for Asthma symptoms. Patient reports her albuterol inhaler is not working and that she has run out of her inhaler.

## 2020-04-13 MED ORDER — ALBUTEROL SULFATE (2.5 MG/3ML) 0.083% IN NEBU
5.0000 mg | INHALATION_SOLUTION | Freq: Four times a day (QID) | RESPIRATORY_TRACT | 0 refills | Status: DC | PRN
Start: 2020-04-13 — End: 2021-02-22

## 2020-04-13 NOTE — Discharge Instructions (Signed)
Use your nebulizer every 4-6 hours for control of wheezing. Continue your other regular medications as prescribed.   If symptoms worsen or if you develop new concern, please return to the emergency department.

## 2020-04-15 ENCOUNTER — Other Ambulatory Visit (HOSPITAL_COMMUNITY): Payer: Self-pay | Admitting: Emergency Medicine

## 2020-04-15 ENCOUNTER — Emergency Department (HOSPITAL_COMMUNITY)
Admission: EM | Admit: 2020-04-15 | Discharge: 2020-04-15 | Disposition: A | Discharge status: Home / Self Care | Payer: Self-pay | Attending: Emergency Medicine | Admitting: Emergency Medicine

## 2020-04-15 ENCOUNTER — Encounter (HOSPITAL_COMMUNITY): Payer: Self-pay

## 2020-04-15 DIAGNOSIS — J4541 Moderate persistent asthma with (acute) exacerbation: Secondary | ICD-10-CM | POA: Insufficient documentation

## 2020-04-15 DIAGNOSIS — Z20822 Contact with and (suspected) exposure to covid-19: Secondary | ICD-10-CM | POA: Insufficient documentation

## 2020-04-15 DIAGNOSIS — Z87891 Personal history of nicotine dependence: Secondary | ICD-10-CM | POA: Insufficient documentation

## 2020-04-15 LAB — POC SARS CORONAVIRUS 2 AG -  ED: SARS Coronavirus 2 Ag: NEGATIVE

## 2020-04-15 MED ORDER — IPRATROPIUM-ALBUTEROL 0.5-2.5 (3) MG/3ML IN SOLN: 3.0000 mL | Freq: Once | RESPIRATORY_TRACT | Status: DC

## 2020-04-15 MED ORDER — AEROCHAMBER PLUS FLO-VU MEDIUM MISC
1.0000 | Freq: Once | Status: AC
  Administered 2020-04-15: 1
  Filled 2020-04-15 (×2): qty 1

## 2020-04-15 MED ORDER — PREDNISONE 20 MG PO TABS
60.0000 mg | ORAL_TABLET | Freq: Once | ORAL | Status: AC
  Administered 2020-04-15: 60 mg via ORAL
  Filled 2020-04-15: qty 3

## 2020-04-15 MED ORDER — BENZONATATE 100 MG PO CAPS
200.0000 mg | ORAL_CAPSULE | Freq: Once | ORAL | Status: AC
  Administered 2020-04-15: 13:00:00 200 mg via ORAL
  Filled 2020-04-15: qty 2

## 2020-04-15 MED ORDER — IPRATROPIUM-ALBUTEROL 0.5-2.5 (3) MG/3ML IN SOLN
3.0000 mL | Freq: Once | RESPIRATORY_TRACT | Status: AC
  Administered 2020-04-15: 3 mL via RESPIRATORY_TRACT
  Filled 2020-04-15: qty 3

## 2020-04-15 MED ORDER — DULERA 200-5 MCG/ACT IN AERO: 2.0000 | INHALATION_SPRAY | Freq: Two times a day (BID) | RESPIRATORY_TRACT | 1 refills | Status: DC

## 2020-04-15 MED ORDER — FLUTICASONE PROPIONATE 50 MCG/ACT NA SUSP: 2.0000 | Freq: Two times a day (BID) | NASAL | 3 refills | Status: DC

## 2020-04-15 MED ORDER — BENZONATATE 100 MG PO CAPS: 100.0000 mg | ORAL_CAPSULE | Freq: Three times a day (TID) | ORAL | 0 refills | Status: DC

## 2020-04-15 MED ORDER — PREDNISONE 10 MG PO TABS: 40.0000 mg | ORAL_TABLET | Freq: Every day | ORAL | 0 refills | Status: DC

## 2020-04-15 MED FILL — DULERA 200-5 MCG/ACT AERO: 200-5 | 30 days supply | Qty: 13 | Fill #0

## 2020-04-15 MED FILL — BENZONATATE 100 MG CAPS: 100 | 7 days supply | Qty: 21 | Fill #0

## 2020-04-15 MED FILL — FLUTICASONE PROP 50 MCG SPR: 50 | 30 days supply | Qty: 16 | Fill #0

## 2020-04-15 MED FILL — ?predniSONE 10MG TABS: 10 | 3 days supply | Qty: 15 | Fill #0

## 2020-04-15 NOTE — Discharge Instructions (Addendum)
I have refilled your prescription for [facility]. I have also prescribed you prednisone for the next 4 days and also we prescribed you Flonase and Tessalon Perles/benzonatate. Please use these medications. Please use albuterol at home please use the AeroChamber that you are provided. Please follow-up with your primary care doctor and pulmonologist.

## 2020-04-15 NOTE — ED Notes (Signed)
Patient refused to complete the discharge process out of fear of missing her transportation waiting outside.

## 2020-04-15 NOTE — ED Triage Notes (Addendum)
Pt presents with c/o asthma. Pt reports her asthma has been flaring up for approx one week with no relief from her inhaler. Pt was seen here a few days ago for same. No acute distress, talking in full sentences.

## 2020-04-15 NOTE — ED Notes (Signed)
Pt incredibly rude as she was exiting the triage room. Attempted to hand pt her BP cuff and she said "I don't want it". Explained to pt that she needs to keep it with her for her treatment while she is here and pt repeats "I don't want it" and storms out of the room.

## 2020-04-16 ENCOUNTER — Inpatient Hospital Stay (HOSPITAL_COMMUNITY)
Admission: EM | Admit: 2020-04-16 | Discharge: 2020-04-19 | DRG: 203 | Disposition: A | Discharge status: Home / Self Care | Payer: Self-pay | Attending: Family Medicine | Admitting: Family Medicine

## 2020-04-16 ENCOUNTER — Other Ambulatory Visit: Payer: Self-pay

## 2020-04-16 ENCOUNTER — Encounter (HOSPITAL_COMMUNITY): Payer: Self-pay | Admitting: Emergency Medicine

## 2020-04-16 ENCOUNTER — Emergency Department (HOSPITAL_COMMUNITY): Payer: Self-pay

## 2020-04-16 DIAGNOSIS — E876 Hypokalemia: Secondary | ICD-10-CM | POA: Diagnosis present

## 2020-04-16 DIAGNOSIS — Z20822 Contact with and (suspected) exposure to covid-19: Secondary | ICD-10-CM | POA: Diagnosis present

## 2020-04-16 DIAGNOSIS — T486X6A Underdosing of antiasthmatics, initial encounter: Secondary | ICD-10-CM | POA: Diagnosis present

## 2020-04-16 DIAGNOSIS — J4552 Severe persistent asthma with status asthmaticus: Principal | ICD-10-CM | POA: Diagnosis present

## 2020-04-16 DIAGNOSIS — Z87891 Personal history of nicotine dependence: Secondary | ICD-10-CM

## 2020-04-16 DIAGNOSIS — Z91138 Patient's unintentional underdosing of medication regimen for other reason: Secondary | ICD-10-CM

## 2020-04-16 DIAGNOSIS — J4551 Severe persistent asthma with (acute) exacerbation: Secondary | ICD-10-CM

## 2020-04-16 DIAGNOSIS — J45901 Unspecified asthma with (acute) exacerbation: Secondary | ICD-10-CM | POA: Diagnosis present

## 2020-04-16 LAB — CBC WITH DIFFERENTIAL/PLATELET
Abs Immature Granulocytes: 0.19 10*3/uL — ABNORMAL HIGH (ref 0.00–0.07)
Basophils Absolute: 0.1 10*3/uL (ref 0.0–0.1)
Basophils Relative: 0 %
Eosinophils Absolute: 1.8 10*3/uL — ABNORMAL HIGH (ref 0.0–0.5)
Eosinophils Relative: 8 %
HCT: 43.5 % (ref 36.0–46.0)
Hemoglobin: 14.4 g/dL (ref 12.0–15.0)
Immature Granulocytes: 1 %
Lymphocytes Relative: 23 %
Lymphs Abs: 5.3 10*3/uL — ABNORMAL HIGH (ref 0.7–4.0)
MCH: 28.9 pg (ref 26.0–34.0)
MCHC: 33.1 g/dL (ref 30.0–36.0)
MCV: 87.3 fL (ref 80.0–100.0)
Monocytes Absolute: 1.7 10*3/uL — ABNORMAL HIGH (ref 0.1–1.0)
Monocytes Relative: 7 %
Neutro Abs: 13.9 10*3/uL — ABNORMAL HIGH (ref 1.7–7.7)
Neutrophils Relative %: 61 %
Platelets: 410 10*3/uL — ABNORMAL HIGH (ref 150–400)
RBC: 4.98 MIL/uL (ref 3.87–5.11)
RDW: 15.1 % (ref 11.5–15.5)
WBC: 23 10*3/uL — ABNORMAL HIGH (ref 4.0–10.5)
nRBC: 0 % (ref 0.0–0.2)

## 2020-04-16 LAB — RESP PANEL BY RT-PCR (FLU A&B, COVID) ARPGX2
Influenza A by PCR: NEGATIVE
Influenza B by PCR: NEGATIVE
SARS Coronavirus 2 by RT PCR: NEGATIVE

## 2020-04-16 LAB — BASIC METABOLIC PANEL
Anion gap: 11 (ref 5–15)
BUN: 11 mg/dL (ref 6–20)
CO2: 27 mmol/L (ref 22–32)
Calcium: 8.8 mg/dL — ABNORMAL LOW (ref 8.9–10.3)
Chloride: 101 mmol/L (ref 98–111)
Creatinine, Ser: 0.8 mg/dL (ref 0.44–1.00)
GFR, Estimated: >60 mL/min (ref >60)
Glucose, Bld: 99 mg/dL (ref 70–99)
Potassium: 3.1 mmol/L — ABNORMAL LOW (ref 3.5–5.1)
Sodium: 139 mmol/L (ref 135–145)

## 2020-04-16 LAB — I-STAT BETA HCG BLOOD, ED (MC, WL, AP ONLY): I-stat hCG, quantitative: <5 m[IU]/mL (ref <5)

## 2020-04-16 LAB — BLOOD GAS, ARTERIAL
Acid-base deficit: 0 mmol/L (ref 0.0–2.0)
Bicarbonate: 23.4 mmol/L (ref 20.0–28.0)
Drawn by: 422461
FIO2: 21
O2 Saturation: 91.1 %
Patient temperature: 98.2
pCO2 arterial: 35.8 mmHg (ref 32.0–48.0)
pH, Arterial: 7.43 (ref 7.350–7.450)
pO2, Arterial: 62.2 mmHg — ABNORMAL LOW (ref 83.0–108.0)

## 2020-04-16 MED ORDER — IPRATROPIUM BROMIDE 0.02 % IN SOLN
0.5000 mg | Freq: Once | RESPIRATORY_TRACT | Status: AC
  Administered 2020-04-16: 0.5 mg via RESPIRATORY_TRACT
  Filled 2020-04-16: qty 2.5

## 2020-04-16 MED ORDER — ALBUTEROL (5 MG/ML) CONTINUOUS INHALATION SOLN
10.0000 mg/h | INHALATION_SOLUTION | Freq: Once | RESPIRATORY_TRACT | Status: AC
  Administered 2020-04-16: 10 mg/h via RESPIRATORY_TRACT
  Filled 2020-04-16: qty 20

## 2020-04-16 MED ORDER — ACETAMINOPHEN 325 MG PO TABS: 650.0000 mg | ORAL_TABLET | Freq: Four times a day (QID) | ORAL | Status: DC | PRN

## 2020-04-16 MED ORDER — MONTELUKAST SODIUM 10 MG PO TABS
10.0000 mg | ORAL_TABLET | Freq: Every day | ORAL | Status: DC
  Administered 2020-04-17 – 2020-04-18 (×3): 10 mg via ORAL
  Filled 2020-04-16 (×3): qty 1

## 2020-04-16 MED ORDER — METHYLPREDNISOLONE SODIUM SUCC 40 MG IJ SOLR
40.0000 mg | Freq: Two times a day (BID) | INTRAMUSCULAR | Status: DC
  Administered 2020-04-17 – 2020-04-18 (×3): 40 mg via INTRAVENOUS
  Filled 2020-04-16 (×3): qty 1

## 2020-04-16 MED ORDER — MAGNESIUM SULFATE 2 GM/50ML IV SOLN
2.0000 g | Freq: Once | INTRAVENOUS | Status: AC
Start: 2020-04-16 — End: 2020-04-16
  Administered 2020-04-16: 2 g via INTRAVENOUS
  Filled 2020-04-16: qty 50

## 2020-04-16 MED ORDER — ALBUTEROL SULFATE (2.5 MG/3ML) 0.083% IN NEBU: 2.5000 mg | INHALATION_SOLUTION | RESPIRATORY_TRACT | Status: DC | PRN

## 2020-04-16 MED ORDER — METHYLPREDNISOLONE SODIUM SUCC 125 MG IJ SOLR
125.0000 mg | Freq: Once | INTRAMUSCULAR | Status: AC
  Administered 2020-04-16: 125 mg via INTRAVENOUS
  Filled 2020-04-16: qty 2

## 2020-04-16 MED ORDER — FLUTICASONE PROPIONATE 50 MCG/ACT NA SUSP
2.0000 | Freq: Every day | NASAL | Status: DC
  Administered 2020-04-17 – 2020-04-19 (×3): 2 via NASAL
  Filled 2020-04-16: qty 16

## 2020-04-16 MED ORDER — BUDESONIDE 0.25 MG/2ML IN SUSP
0.2500 mg | Freq: Two times a day (BID) | RESPIRATORY_TRACT | Status: DC
  Administered 2020-04-17 – 2020-04-19 (×6): 0.25 mg via RESPIRATORY_TRACT
  Filled 2020-04-16 (×6): qty 2

## 2020-04-16 MED ORDER — IPRATROPIUM BROMIDE 0.02 % IN SOLN
0.5000 mg | RESPIRATORY_TRACT | Status: DC
  Administered 2020-04-17: 0.5 mg via RESPIRATORY_TRACT
  Filled 2020-04-16: qty 2.5

## 2020-04-16 MED ORDER — ENOXAPARIN SODIUM 40 MG/0.4ML ~~LOC~~ SOLN
40.0000 mg | SUBCUTANEOUS | Status: DC
  Administered 2020-04-17: 40 mg via SUBCUTANEOUS
  Filled 2020-04-16 (×3): qty 0.4

## 2020-04-16 MED ORDER — ACETAMINOPHEN 650 MG RE SUPP: 650.0000 mg | Freq: Four times a day (QID) | RECTAL | Status: DC | PRN

## 2020-04-16 MED ORDER — ALBUTEROL SULFATE (2.5 MG/3ML) 0.083% IN NEBU
2.5000 mg | INHALATION_SOLUTION | RESPIRATORY_TRACT | Status: DC
  Administered 2020-04-17: 2.5 mg via RESPIRATORY_TRACT
  Filled 2020-04-16: qty 3

## 2020-04-16 NOTE — ED Provider Notes (Signed)
[facility]-EMERGENCY DEPT Provider Note   CSN: [id number] Arrival date & time: 04/15/20  5852     History Chief Complaint  Patient presents with  . Asthma    Wendy Morgan is a 44 y.o. female.  HPI  Patient is a 44 year old female With past medical history significant for asthma.  Patient is presented today with wheezing, cough, shortness of breath.  She states there is intermittent swelling of her breast in 1 week.  She is not had any relief from her albuterol inhaler.  She takes no other medications for her asthma.  She was seen 12/27 and improved after several nebulizer treatments.  She denies any chest pain.  She denies any fevers or chills.  She states she has not had any exposure to Covid.  She is vaccinated.      Past Medical History:  Diagnosis Date  . Asthma   . Bronchiectasis with acute exacerbation [facility])     Patient Active Problem List   Diagnosis Date Noted  . Acute recurrent maxillary sinusitis 07/25/2019  . Allergic rhinitis with postnasal drip 07/22/2019  . Acute recurrent pansinusitis 07/22/2019  . Dental caries 07/22/2019  . Asthma exacerbation 05/29/2018    Past Surgical History:  Procedure Laterality Date  . TUBAL LIGATION       OB History   No obstetric history on file.     Family History  Problem Relation Age of Onset  . Healthy Mother   . Healthy Father   . Kidney disease Neg Hx   . Anxiety disorder Neg Hx   . Depression Neg Hx     Social History   Tobacco Use  . Smoking status: Former Smoker    Quit date: 1998    Years since quitting: 24.0  . Smokeless tobacco: Never Used  Vaping Use  . Vaping Use: Never used  Substance Use Topics  . Alcohol use: No  . Drug use: No    Home Medications Prior to Admission medications   Medication Sig Start Date End Date Taking? Authorizing Provider  predniSONE (DELTASONE) 10 MG tablet Take 4 tablets (40 mg total) by mouth daily with breakfast. 04/15/20  Yes  Kiffany Schelling S, PA  albuterol (PROVENTIL) (2.5 MG/3ML) 0.083% nebulizer solution Take 6 mLs (5 mg total) by nebulization every 6 (six) hours as needed for wheezing or shortness of breath. 04/13/20   Elpidio Anis, PA-C  albuterol (VENTOLIN HFA) 108 (90 Base) MCG/ACT inhaler Inhale 2 puffs into the lungs every 4 (four) hours as needed for wheezing or shortness of breath. 12/24/19   Claiborne Rigg, NP  benzonatate (TESSALON) 100 MG capsule Take 1 capsule (100 mg total) by mouth every 8 (eight) hours. 04/15/20   Gailen Shelter, PA  cetirizine (ZYRTEC) 10 MG tablet Take 1 tablet (10 mg total) by mouth daily. 08/05/18   Claiborne Rigg, NP  fluticasone (FLONASE) 50 MCG/ACT nasal spray Place 2 sprays into both nostrils in the morning and at bedtime. 04/15/20   Gailen Shelter, PA  mometasone-formoterol (DULERA) 200-5 MCG/ACT AERO Inhale 2 puffs into the lungs 2 (two) times daily. 04/15/20   Burman Bruington, Rodrigo Ran, PA  montelukast (SINGULAIR) 10 MG tablet Take 1 tablet (10 mg total) by mouth at bedtime. 12/24/19 03/23/20  Claiborne Rigg, NP  Olopatadine HCl 0.2 % SOLN Apply 1 drop to eye daily. 04/07/19   Mardella Layman, MD  pantoprazole (PROTONIX) 40 MG tablet Take 1 tablet (40 mg total) by mouth daily.  12/24/19   Claiborne Rigg, NP  umeclidinium bromide (INCRUSE ELLIPTA) 62.5 MCG/INH AEPB Inhale 1 puff into the lungs daily. 12/24/19   Claiborne Rigg, NP    Allergies    Patient has no known allergies.  Review of Systems   Review of Systems  Constitutional: Positive for fatigue. Negative for fever.  HENT: Negative for congestion.   Respiratory: Positive for cough, chest tightness, shortness of breath and wheezing.   Cardiovascular: Negative for chest pain.  Gastrointestinal: Negative for abdominal distention.  Genitourinary: Negative for dysuria.  Musculoskeletal: Negative for myalgias.  Skin: Negative for rash.  Neurological: Negative for dizziness and headaches.  Psychiatric/Behavioral: Negative  for agitation.    Physical Exam Updated Vital Signs BP 122/80   Pulse 81   Temp 97.9 F (36.6 C) (Oral)   Resp (!) 22   Ht 5\' 4"  (1.626 m)   Wt 99.8 kg   LMP 03/31/2020 (Approximate)   SpO2 93%   BMI 37.76 kg/m   Physical Exam Vitals and nursing note reviewed.  Constitutional:      General: She is not in acute distress.    Appearance: She is obese.     Comments: Obese 44 year old female speaking full sentences.  Is tachypneic.  Wheezing audible  HENT:     Head: Normocephalic and atraumatic.     Nose: Nose normal.     Mouth/Throat:     Mouth: Mucous membranes are moist.  Eyes:     General: No scleral icterus. Cardiovascular:     Rate and Rhythm: Normal rate and regular rhythm.     Pulses: Normal pulses.     Heart sounds: Normal heart sounds.  Pulmonary:     Effort: Pulmonary effort is normal. No respiratory distress.     Breath sounds: Wheezing present.     Comments: Significant expiratory wheezing audible without auscultation.  Coarse lung sounds with auscultation.  Mild tachypnea. Abdominal:     Palpations: Abdomen is soft.     Tenderness: There is no abdominal tenderness. There is no guarding or rebound.  Musculoskeletal:     Cervical back: Normal range of motion.     Right lower leg: No edema.     Left lower leg: No edema.  Skin:    General: Skin is warm and dry.     Capillary Refill: Capillary refill takes less than 2 seconds.  Neurological:     Mental Status: She is alert. Mental status is at baseline.  Psychiatric:        Mood and Affect: Mood normal.        Behavior: Behavior normal.     ED Results / Procedures / Treatments   Labs (all labs ordered are listed, but only abnormal results are displayed) Labs Reviewed  POC SARS CORONAVIRUS 2 AG -  ED    EKG None  Radiology No results found.  Procedures Procedures (including critical care time)  Medications Ordered in ED Medications  predniSONE (DELTASONE) tablet 60 mg (60 mg Oral Given  04/15/20 1306)  AeroChamber Plus Flo-Vu Medium MISC 1 each (1 each Other Given 04/15/20 1342)  ipratropium-albuterol (DUONEB) 0.5-2.5 (3) MG/3ML nebulizer solution 3 mL (3 mLs Nebulization Given 04/15/20 1343)  benzonatate (TESSALON) capsule 200 mg (200 mg Oral Given 04/15/20 1306)    ED Course  I have reviewed the triage vital signs and the nursing notes.  Pertinent labs & imaging results that were available during my care of the patient were reviewed by me and considered in  my medical decision making (see chart for details).    MDM Rules/Calculators/A&P                          Patient is 44 year old female with history of asthma she has recently had an asthma exacerbation was discharged without steroids and he was to have had relapse of symptoms.  Initially my evaluation she is having significant wheezing.  This improved but was not absent after a single DuoNeb.  She is also already received prednisone 60 mg.  She is coughing significantly we will also provide her with Jerilynn Som and repeat the DuoNeb.  My reassessment she is significantly improved.    Will discharge with steroids and follow-up with PCP.  She is also given an AeroChamber to use as a spacer with her home albuterol inhaler.  She is understanding of return precautions.  Wendy Morgan was evaluated in Emergency Department on 04/16/2020 for the symptoms described in the history of present illness. She was evaluated in the context of the global COVID-19 pandemic, which necessitated consideration that the patient might be at risk for infection with the SARS-CoV-2 virus that causes COVID-19. Institutional protocols and algorithms that pertain to the evaluation of patients at risk for COVID-19 are in a state of rapid change based on information released by regulatory bodies including the CDC and federal and state organizations. These policies and algorithms were followed during the patient's care in the ED.  Final Clinical  Impression(s) / ED Diagnoses Final diagnoses:  Moderate persistent asthma with exacerbation    Rx / DC Orders ED Discharge Orders         Ordered    mometasone-formoterol (DULERA) 200-5 MCG/ACT AERO  2 times daily        04/15/20 1423    fluticasone (FLONASE) 50 MCG/ACT nasal spray  2 times daily        04/15/20 1423    benzonatate (TESSALON) 100 MG capsule  Every 8 hours        04/15/20 1423    predniSONE (DELTASONE) 10 MG tablet  Daily with breakfast        04/15/20 1423           Gailen Shelter, [place] 04/16/20 1723    Pollyann Savoy, MD 04/16/20 1756

## 2020-04-16 NOTE — ED Triage Notes (Signed)
Per pt, states she was here yesterday for her asthma symptoms-states they [facility] scripts to the [facility] but they are closed-states symptoms continue-no acute respiratory distress she just needs a steroid shot to last her till Monday when she can get her meds

## 2020-04-16 NOTE — ED Provider Notes (Signed)
[facility]-EMERGENCY DEPT Provider Note   CSN: [id number] Arrival date & time: 04/16/20  1520     History Chief Complaint  Patient presents with  . Asthma    Wendy Morgan is a 44 y.o. female with past medical history of poorly controlled asthma presents to the ED for evaluation of shortness of breath. Reports feeling like her asthma has been flaring up for about a week. This is her third ED visit for this. Seen in the ED 24 hours ago. States that she felt better after the breathing treatments in the ED yesterday unfortunately prescriptions were sent to a pharmacy that was closed and patient was unable to continue her breathing treatments or oral medicines after discharge. She feels like if she would have been able to pick up her prescriptions she would have felt better by now but instead feels like the symptoms have worsened. Reports coughing with yellow phlegm, wheezing, chest tightness. No frank chest pain but feels like her chest is tight and also sore with coughing. Denies fever, rhinorrhea, sore throat. No nausea, vomiting, diarrhea or abdominal pain. No loss of taste or smell. She is vaccinated for COVID without a booster. No sick contacts. No tobacco use. Has required 1 hospitalization for asthma. No intubations. No alleviating factors. Symptoms are worse with exertion.  HPI     Past Medical History:  Diagnosis Date  . Asthma   . Bronchiectasis with acute exacerbation [facility](HCC)     Patient Active Problem List   Diagnosis Date Noted  . Acute recurrent maxillary sinusitis 07/25/2019  . Allergic rhinitis with postnasal drip 07/22/2019  . Acute recurrent pansinusitis 07/22/2019  . Dental caries 07/22/2019  . Asthma exacerbation 05/29/2018    Past Surgical History:  Procedure Laterality Date  . TUBAL LIGATION       OB History   No obstetric history on file.     Family History  Problem Relation Age of Onset  . Healthy Mother   . Healthy Father    . Kidney disease Neg Hx   . Anxiety disorder Neg Hx   . Depression Neg Hx     Social History   Tobacco Use  . Smoking status: Former Smoker    Quit date: 1998    Years since quitting: 24.0  . Smokeless tobacco: Never Used  Vaping Use  . Vaping Use: Never used  Substance Use Topics  . Alcohol use: No  . Drug use: No    Home Medications Prior to Admission medications   Medication Sig Start Date End Date Taking? Authorizing Provider  albuterol (PROVENTIL) (2.5 MG/3ML) 0.083% nebulizer solution Take 6 mLs (5 mg total) by nebulization every 6 (six) hours as needed for wheezing or shortness of breath. 04/13/20   Elpidio AnisUpstill, Shari, PA-C  albuterol (VENTOLIN HFA) 108 (90 Base) MCG/ACT inhaler Inhale 2 puffs into the lungs every 4 (four) hours as needed for wheezing or shortness of breath. 12/24/19   Claiborne RiggFleming, Zelda W, NP  benzonatate (TESSALON) 100 MG capsule Take 1 capsule (100 mg total) by mouth every 8 (eight) hours. 04/15/20   Gailen ShelterFondaw, Wylder S, PA  cetirizine (ZYRTEC) 10 MG tablet Take 1 tablet (10 mg total) by mouth daily. 08/05/18   Claiborne RiggFleming, Zelda W, NP  fluticasone (FLONASE) 50 MCG/ACT nasal spray Place 2 sprays into both nostrils in the morning and at bedtime. 04/15/20   Gailen ShelterFondaw, Wylder S, PA  mometasone-formoterol (DULERA) 200-5 MCG/ACT AERO Inhale 2 puffs into the lungs 2 (two) times daily.  04/15/20   Fondaw, Rodrigo Ran, PA  montelukast (SINGULAIR) 10 MG tablet Take 1 tablet (10 mg total) by mouth at bedtime. 12/24/19 03/23/20  Claiborne Rigg, NP  Olopatadine HCl 0.2 % SOLN Apply 1 drop to eye daily. 04/07/19   Mardella Layman, MD  pantoprazole (PROTONIX) 40 MG tablet Take 1 tablet (40 mg total) by mouth daily. 12/24/19   Claiborne Rigg, NP  predniSONE (DELTASONE) 10 MG tablet Take 4 tablets (40 mg total) by mouth daily with breakfast. 04/15/20   Fondaw, Rodrigo Ran, PA  umeclidinium bromide (INCRUSE ELLIPTA) 62.5 MCG/INH AEPB Inhale 1 puff into the lungs daily. 12/24/19   Claiborne Rigg, NP     Allergies    Patient has no known allergies.  Review of Systems   Review of Systems  Respiratory: Positive for cough, chest tightness, shortness of breath and wheezing.   Cardiovascular: Positive for chest pain.  All other systems reviewed and are negative.   Physical Exam Updated Vital Signs BP (!) 153/106   Pulse (!) 119   Temp 98.2 F (36.8 C) (Oral)   Resp (!) 26   Ht 5\' 4"  (1.626 m)   Wt 99.8 kg   LMP 03/31/2020 (Approximate)   SpO2 96%   BMI 37.76 kg/m   Physical Exam Vitals and nursing note reviewed.  Constitutional:      General: She is not in acute distress.    Appearance: She is well-developed and well-nourished.     Comments: Patient found sitting straight up on side of the bed. Audible wheezing without auscultation  HENT:     Head: Normocephalic and atraumatic.     Right Ear: External ear normal.     Left Ear: External ear normal.     Nose: Nose normal.  Eyes:     General: No scleral icterus.    Extraocular Movements: EOM normal.     Conjunctiva/sclera: Conjunctivae normal.  Cardiovascular:     Rate and Rhythm: Normal rate and regular rhythm.     Heart sounds: Normal heart sounds. No murmur heard.   Pulmonary:     Breath sounds: Wheezing present.     Comments: Mild increased work of breathing. Patient appears dyspneic with prolonged conversation. Still able to talk in full sentences. Audible wheezing without auscultation. Inspiratory and expiratory wheezing/coarse lung sounds in all lung fields. Diminished in the lower lobes. Musculoskeletal:        General: No deformity. Normal range of motion.     Cervical back: Normal range of motion and neck supple.  Skin:    General: Skin is warm and dry.     Capillary Refill: Capillary refill takes less than 2 seconds.  Neurological:     Mental Status: She is alert and oriented to person, place, and time.  Psychiatric:        Mood and Affect: Mood and affect normal.        Behavior: Behavior normal.         Thought Content: Thought content normal.        Judgment: Judgment normal.     ED Results / Procedures / Treatments   Labs (all labs ordered are listed, but only abnormal results are displayed) Labs Reviewed  BASIC METABOLIC PANEL - Abnormal; Notable for the following components:      Result Value   Potassium 3.1 (*)    Calcium 8.8 (*)    All other components within normal limits  CBC WITH DIFFERENTIAL/PLATELET - Abnormal; Notable for the  following components:   WBC 23.0 (*)    Platelets 410 (*)    Neutro Abs 13.9 (*)    Lymphs Abs 5.3 (*)    Monocytes Absolute 1.7 (*)    Eosinophils Absolute 1.8 (*)    Abs Immature Granulocytes 0.19 (*)    All other components within normal limits  RESP PANEL BY RT-PCR (FLU A&B, COVID) ARPGX2  BLOOD GAS, ARTERIAL  I-STAT BETA HCG BLOOD, ED ([facility], [facility], [facility] ONLY)    EKG EKG Interpretation  Date/Time:  Friday April 16 2020 19:58:42 EST Ventricular Rate:  95 PR Interval:    QRS Duration: 99 QT Interval:  357 QTC Calculation: 449 R Axis:   67 Text Interpretation: Sinus rhythm Probable left atrial enlargement No significant change since last tracing Confirmed by Richardean Canal ([id number]) on 04/16/2020 9:04:40 PM   Radiology DG Chest Port 1 View  Result Date: 04/16/2020 CLINICAL DATA:  Persistent asthma.  Negative COVID. EXAM: PORTABLE CHEST 1 VIEW COMPARISON:  04/12/2020 FINDINGS: The heart size and mediastinal contours are within normal limits. Both lungs are clear. The visualized skeletal structures are unremarkable. IMPRESSION: No active disease. Electronically Signed   By: Burman Nieves M.D.   On: 04/16/2020 20:27    Procedures .Critical Care Performed by: Liberty Handy, PA-C Authorized by: Liberty Handy, PA-C   Critical care provider statement:    Critical care time (minutes):  45   Critical care was necessary to treat or prevent imminent or life-threatening deterioration of the following conditions: asthma  exacerbation, cont duoneb treatments, hospitalizations.   Critical care was time spent personally by me on the following activities:  Discussions with consultants, evaluation of patient's response to treatment, examination of patient, ordering and performing treatments and interventions, ordering and review of laboratory studies, ordering and review of radiographic studies, pulse oximetry, re-evaluation of patient's condition, obtaining history from patient or surrogate, review of old charts and development of treatment plan with patient or surrogate   I assumed direction of critical care for this patient from another provider in my specialty: no     (including critical care time)  Medications Ordered in ED Medications  albuterol (PROVENTIL,VENTOLIN) solution continuous neb (10 mg/hr Nebulization Given 04/16/20 2026)  methylPREDNISolone sodium succinate (SOLU-MEDROL) 125 mg/2 mL injection 125 mg (125 mg Intravenous Given 04/16/20 1957)  ipratropium (ATROVENT) nebulizer solution 0.5 mg (0.5 mg Nebulization Given 04/16/20 1956)  magnesium sulfate IVPB 2 g 50 mL (0 g Intravenous Stopped 04/16/20 2056)    ED Course  I have reviewed the triage vital signs and the nursing notes.  Pertinent labs & imaging results that were available during my care of the patient were reviewed by me and considered in my medical decision making (see chart for details).  Clinical Course as of 04/16/20 2147  Fri Apr 16, 2020  2045 DG Chest [place] 1 View  IMPRESSION: No active disease.   [CG]  2045 WBC(!): 23.0 [CG]  2045 Patient re-evaluated. Still sitting up straight in bed. Audible wheezing persistent without auscultation. States she feels 50% better. No significant improvement in wheezing on re peat exam. Discussed admission, patient agreeable.  [CG]  2046 NEUT#(!): 13.9 [CG]  2046 Lymphocyte #(!): 5.3 [CG]  2046 Abs Immature Granulocytes(!): 0.19 [CG]    Clinical Course User Index [CG] Liberty Handy,  PA-C   MDM Rules/Calculators/A&P  44 y.o. yo with chief complaint of cough, yellow sputum, wheezing, chest tightness, shortness of breath.  1 week.  Third ED visit for same.  Had 2 negative POC tests previous ED visits.  Vaccinated for COVID but no booster.  Previous medical records available, triage and nursing notes reviewed to obtain more history and assist with MDM  Chief complain involves an extensive number of treatment options and is a complaint that carries with it a high risk of complications and morbidity and mortality.    Differential diagnosis: Asthma exacerbation/bronchospasm, viral illness/COVID/influenza, community-acquired pneumonia.  Exam and symptoms not consistent with ACS, PE.  ER lab work and imaging ordered by triage RN and me, as above  I have personally visualized and interpreted ER diagnostic work up including labs and imaging.    Labs reveal -WBC 23 with left shift.  Suspect this is due to Decadron twice in previous ED visits.  She denies fevers.  Has been tachycardic in the ED which worsened after continuous DuoNeb treatment but suspect this is secondary to albuterol.  K3.1 also possible related to albuterol.  Imaging reveals -EKG with normal sinus rhythm and no significant changes.  Chest x-ray is negative.  Given  Medications ordered -this is patient's third ED visit.  In previous ED visits she has had albuterol/ipratropium inhalers and symptoms worsening.  Cont albuterol/ipratropium nebulizing treatment ordered, magnesium, methylprednisone.  Ordered continuous cardiac and pulse ox monitoring.  Will plan for serial re-examinations. Close monitoring.   2145: Re-evaluated the patient after breathing treatment.  Reports feeling 50% better.  Persistent audible wheezing on exam.  Still appears slightly dyspneic with prolonged conversation.  Overall, clinical presentation and ER work up most suggestive of severe asthma exacerbation/status  asthmaticus.  Respiratory panel ordered.  Discussed with EDP who recommends ABG.  Will consult hospitalist for admission.  Consults made in the ED: RT, hospitalist  Critical interventions in the ED: Continues breathing treatments, serial exams, hospitalization  Final Clinical Impression(s) / ED Diagnoses Final diagnoses:  Severe persistent asthma with exacerbation  Severe persistent asthma with status asthmaticus    Rx / DC Orders ED Discharge Orders    None       Liberty Handy, PA-C 04/16/20 2148    Charlynne Pander, MD 04/16/20 [phone]

## 2020-04-16 NOTE — H&P (Signed)
History and Physical    Wendy Morgan QIW:[id number] DOB: 11-03-1975 DOA: 04/16/2020  PCP: Claiborne Rigg, NP  Patient coming from: Home.  Chief Complaint: Shortness of breath.  HPI: Wendy Morgan is a 44 y.o. female with known history of asthma presents to the ER for the third time in the last 3 days with complaints of worsening shortness of breath and wheezing.  Denies chest pain fever chills or productive cough.  Patient has been in shortness of breath for the last week.  ED Course: In the ER patient is found to be diffusely wheezing with chest x-ray showing nothing acute patient was afebrile.  ABG does not show any increase in PCO2.  Given the persistent shortness of breath and wheezing patient admitted for further management of asthma exacerbation.  EKG shows normal sinus rhythm labs are significant for leukocytosis of 23,000 and mild hypokalemia.  Covid test was negative.  Review of Systems: As per HPI, rest all negative.   Past Medical History:  Diagnosis Date  . Asthma   . Bronchiectasis with acute exacerbation [facility])     Past Surgical History:  Procedure Laterality Date  . TUBAL LIGATION       reports that she quit smoking about 24 years ago. She has never used smokeless tobacco. She reports that she does not drink alcohol and does not use drugs.  No Known Allergies  Family History  Problem Relation Age of Onset  . Healthy Mother   . Healthy Father   . Kidney disease Neg Hx   . Anxiety disorder Neg Hx   . Depression Neg Hx     Prior to Admission medications   Medication Sig Start Date End Date Taking? Authorizing Provider  albuterol (PROVENTIL) (2.5 MG/3ML) 0.083% nebulizer solution Take 6 mLs (5 mg total) by nebulization every 6 (six) hours as needed for wheezing or shortness of breath. 04/13/20   Elpidio Anis, PA-C  albuterol (VENTOLIN HFA) 108 (90 Base) MCG/ACT inhaler Inhale 2 puffs into the lungs every 4 (four) hours as needed for wheezing or  shortness of breath. 12/24/19   Claiborne Rigg, NP  benzonatate (TESSALON) 100 MG capsule Take 1 capsule (100 mg total) by mouth every 8 (eight) hours. 04/15/20   Gailen Shelter, PA  cetirizine (ZYRTEC) 10 MG tablet Take 1 tablet (10 mg total) by mouth daily. 08/05/18   Claiborne Rigg, NP  fluticasone (FLONASE) 50 MCG/ACT nasal spray Place 2 sprays into both nostrils in the morning and at bedtime. 04/15/20   Gailen Shelter, PA  mometasone-formoterol (DULERA) 200-5 MCG/ACT AERO Inhale 2 puffs into the lungs 2 (two) times daily. 04/15/20   Fondaw, Rodrigo Ran, PA  montelukast (SINGULAIR) 10 MG tablet Take 1 tablet (10 mg total) by mouth at bedtime. 12/24/19 03/23/20  Claiborne Rigg, NP  Olopatadine HCl 0.2 % SOLN Apply 1 drop to eye daily. 04/07/19   Mardella Layman, MD  pantoprazole (PROTONIX) 40 MG tablet Take 1 tablet (40 mg total) by mouth daily. 12/24/19   Claiborne Rigg, NP  predniSONE (DELTASONE) 10 MG tablet Take 4 tablets (40 mg total) by mouth daily with breakfast. 04/15/20   Fondaw, Rodrigo Ran, PA  umeclidinium bromide (INCRUSE ELLIPTA) 62.5 MCG/INH AEPB Inhale 1 puff into the lungs daily. 12/24/19   Claiborne Rigg, NP    Physical Exam: Constitutional: Moderately built and nourished. Vitals:   04/16/20 2000 04/16/20 2027 04/16/20 2116 04/16/20 2317  BP: (!) 146/71  (!) 153/106  136/89  Pulse: 94  (!) 119 98  Resp: (!) 24  18 (!) 23  Temp:      TempSrc:      SpO2: 100% 100% 96% 95%  Weight:      Height:       Eyes: Anicteric no pallor. ENMT: No discharge from the ears eyes nose or mouth. Neck: No mass felt.  No neck rigidity.  No JVD appreciated. Respiratory: Bilateral expiratory wheeze and no crepitations. Cardiovascular: S1-S2 heard. Abdomen: Soft nontender bowel sounds present. Musculoskeletal: No edema. Skin: No rash. Neurologic: Alert awake oriented to time place and person.  Moves all extremities. Psychiatric: Appears normal.  Normal affect.   Labs on Admission: I have  personally reviewed following labs and imaging studies  CBC: Recent Labs  Lab 04/16/20 2000  WBC 23.0*  NEUTROABS 13.9*  HGB 14.4  HCT 43.5  MCV 87.3  PLT 410*   Basic Metabolic Panel: Recent Labs  Lab 04/16/20 2000  NA 139  K 3.1*  CL 101  CO2 27  GLUCOSE 99  BUN 11  CREATININE 0.80  CALCIUM 8.8*   GFR: Estimated Creatinine Clearance: 101.9 mL/min (by C-G formula based on SCr of 0.8 mg/dL). Liver Function Tests: No results for input(s): AST, ALT, ALKPHOS, BILITOT, PROT, ALBUMIN in the last 168 hours. No results for input(s): LIPASE, AMYLASE in the last 168 hours. No results for input(s): AMMONIA in the last 168 hours. Coagulation Profile: No results for input(s): INR, PROTIME in the last 168 hours. Cardiac Enzymes: No results for input(s): CKTOTAL, CKMB, CKMBINDEX, TROPONINI in the last 168 hours. BNP (last 3 results) No results for input(s): PROBNP in the last 8760 hours. HbA1C: No results for input(s): HGBA1C in the last 72 hours. CBG: No results for input(s): GLUCAP in the last 168 hours. Lipid Profile: No results for input(s): CHOL, HDL, LDLCALC, TRIG, CHOLHDL, LDLDIRECT in the last 72 hours. Thyroid Function Tests: No results for input(s): TSH, T4TOTAL, FREET4, T3FREE, THYROIDAB in the last 72 hours. Anemia Panel: No results for input(s): VITAMINB12, FOLATE, FERRITIN, TIBC, IRON, RETICCTPCT in the last 72 hours. Urine analysis:    Component Value Date/Time   COLORURINE AMBER (A) 09/09/2019 2239   APPEARANCEUR CLOUDY (A) 09/09/2019 2239   LABSPEC 1.032 (H) 09/09/2019 2239   PHURINE 5.0 09/09/2019 2239   GLUCOSEU NEGATIVE 09/09/2019 2239   HGBUR NEGATIVE 09/09/2019 2239   BILIRUBINUR NEGATIVE 09/09/2019 2239   KETONESUR 5 (A) 09/09/2019 2239   PROTEINUR 100 (A) 09/09/2019 2239   NITRITE NEGATIVE 09/09/2019 2239   LEUKOCYTESUR LARGE (A) 09/09/2019 2239   Sepsis Labs: @LABRCNTIP (procalcitonin:4,lacticidven:4) ) Recent Results (from the past 240  hour(s))  Resp Panel by RT-PCR (Flu A&B, Covid) Nasopharyngeal Swab     Status: None   Collection Time: 04/16/20  9:06 PM   Specimen: Nasopharyngeal Swab; Nasopharyngeal(NP) swabs in vial transport medium  Result Value Ref Range Status   SARS Coronavirus 2 by RT PCR NEGATIVE NEGATIVE Final    Comment: (NOTE) SARS-CoV-2 target nucleic acids are NOT DETECTED.  The SARS-CoV-2 RNA is generally detectable in upper respiratory specimens during the acute phase of infection. The lowest concentration of SARS-CoV-2 viral copies this assay can detect is 138 copies/mL. A negative result does not preclude SARS-Cov-2 infection and should not be used as the sole basis for treatment or other patient management decisions. A negative result may occur with  improper specimen collection/handling, submission of specimen other than nasopharyngeal swab, presence of viral mutation(s) within the areas targeted  by this assay, and inadequate number of viral copies(<138 copies/mL). A negative result must be combined with clinical observations, patient history, and epidemiological information. The expected result is Negative.  Fact Sheet for Patients:  BloggerCourse.com  Fact Sheet for Healthcare Providers:  SeriousBroker.it  This test is no t yet approved or cleared by the [place] FDA and  has been authorized for detection and/or diagnosis of SARS-CoV-2 by FDA under an Emergency Use Authorization (EUA). This EUA will remain  in effect (meaning this test can be used) for the duration of the COVID-19 declaration under Section 564(b)(1) of the Act, 21 U.S.C.section 360bbb-3(b)(1), unless the authorization is terminated  or revoked sooner.       Influenza A by PCR NEGATIVE NEGATIVE Final   Influenza B by PCR NEGATIVE NEGATIVE Final    Comment: (NOTE) The Xpert Xpress SARS-CoV-2/FLU/RSV plus assay is intended as an aid in the diagnosis of influenza from  Nasopharyngeal swab specimens and should not be used as a sole basis for treatment. Nasal washings and aspirates are unacceptable for Xpert Xpress SARS-CoV-2/FLU/RSV testing.  Fact Sheet for Patients: BloggerCourse.com  Fact Sheet for Healthcare Providers: SeriousBroker.it  This test is not yet approved or cleared by the [place] FDA and has been authorized for detection and/or diagnosis of SARS-CoV-2 by FDA under an Emergency Use Authorization (EUA). This EUA will remain in effect (meaning this test can be used) for the duration of the COVID-19 declaration under Section 564(b)(1) of the Act, 21 U.S.C. section 360bbb-3(b)(1), unless the authorization is terminated or revoked.  Performed at [facility], [address], [place], [place] [address]      Radiological Exams on Admission: DG Chest Port 1 View  Result Date: 04/16/2020 CLINICAL DATA:  Persistent asthma.  Negative COVID. EXAM: PORTABLE CHEST 1 VIEW COMPARISON:  04/12/2020 FINDINGS: The heart size and mediastinal contours are within normal limits. Both lungs are clear. The visualized skeletal structures are unremarkable. IMPRESSION: No active disease. Electronically Signed   By: Burman Nieves M.D.   On: 04/16/2020 20:27    EKG: Independently reviewed.  Normal sinus rhythm.  Assessment/Plan Principal Problem:   Asthma exacerbation    1. Acute asthma exacerbation for which I have placed patient on IV Solu-Medrol Pulmicort nebulizer.  Closely follow respiratory status. 2. Leukocytosis could be from recent steroid use.  No definite signs of any infection.  We will continue to trend CBC. 3. Mild hypokalemia replace and recheck.  Since patient has significant shortness of breath with wheezing and asthma exacerbation will admit as inpatient.   DVT prophylaxis: Lovenox. Code Status: Full code. Family Communication: Discussed with  patient. Disposition Plan: Home. Consults called: None. Admission status: Inpatient.   Eduard Clos MD [facility] Pager [phone].  If 7PM-7AM, please contact night-coverage [email or web address] Password [facility]  04/16/2020, 11:38 PM

## 2020-04-17 LAB — CBC
HCT: 40.7 % (ref 36.0–46.0)
Hemoglobin: 13.6 g/dL (ref 12.0–15.0)
MCH: 29.6 pg (ref 26.0–34.0)
MCHC: 33.4 g/dL (ref 30.0–36.0)
MCV: 88.5 fL (ref 80.0–100.0)
Platelets: 386 10*3/uL (ref 150–400)
RBC: 4.6 MIL/uL (ref 3.87–5.11)
RDW: 15.2 % (ref 11.5–15.5)
WBC: 19.9 10*3/uL — ABNORMAL HIGH (ref 4.0–10.5)
nRBC: 0 % (ref 0.0–0.2)

## 2020-04-17 LAB — BASIC METABOLIC PANEL
Anion gap: 14 (ref 5–15)
BUN: 10 mg/dL (ref 6–20)
CO2: 20 mmol/L — ABNORMAL LOW (ref 22–32)
Calcium: 8.9 mg/dL (ref 8.9–10.3)
Chloride: 102 mmol/L (ref 98–111)
Creatinine, Ser: 0.78 mg/dL (ref 0.44–1.00)
GFR, Estimated: >60 mL/min (ref >60)
Glucose, Bld: 197 mg/dL — ABNORMAL HIGH (ref 70–99)
Potassium: 3.6 mmol/L (ref 3.5–5.1)
Sodium: 136 mmol/L (ref 135–145)

## 2020-04-17 LAB — HIV ANTIBODY (ROUTINE TESTING W REFLEX): HIV Screen 4th Generation wRfx: NONREACTIVE

## 2020-04-17 MED ORDER — IPRATROPIUM-ALBUTEROL 0.5-2.5 (3) MG/3ML IN SOLN
3.0000 mL | RESPIRATORY_TRACT | Status: DC
  Administered 2020-04-17 – 2020-04-18 (×10): 3 mL via RESPIRATORY_TRACT
  Filled 2020-04-17 (×10): qty 3

## 2020-04-17 MED ORDER — POTASSIUM CHLORIDE CRYS ER 20 MEQ PO TBCR
40.0000 meq | EXTENDED_RELEASE_TABLET | Freq: Once | ORAL | Status: AC
  Administered 2020-04-17: 40 meq via ORAL
  Filled 2020-04-17: qty 2

## 2020-04-17 NOTE — ED Notes (Signed)
Report called to [facility], pt will be transported at this time.

## 2020-04-17 NOTE — Plan of Care (Signed)
Patient continues with Iv steroids and nebs, maintaining oxygen levels in the mid to high 90's on 2 liters.

## 2020-04-17 NOTE — Plan of Care (Signed)
  Problem: Activity: Goal: Risk for activity intolerance will decrease Outcome: Progressing   Problem: Clinical Measurements: Goal: Will remain free from infection Outcome: Progressing Goal: Cardiovascular complication will be avoided Outcome: Progressing

## 2020-04-17 NOTE — ED Notes (Signed)
Called receiving unit to give report. Secretary states the charge nurse has not approved this patient yet, asked to call back in 15 minutes.

## 2020-04-17 NOTE — Progress Notes (Signed)
PROGRESS NOTE    Wendy Morgan  EXB:[id number] DOB: 08-05-1975 DOA: 04/16/2020 PCP: Gildardo Pounds, NP    Brief Narrative:  45 year old female with history of moderate intermittent asthma presented to the ER with ongoing shortness of breath and wheezing.  Visited ER multiple times.  Could not feel up prescriptions as the clinics were closed.  In the emergency room diffusely wheezing.  Chest x-ray normal.  Blood gas analysis normal.  Given persistent shortness of breath was admitted to the hospital.   Assessment & Plan:   Principal Problem:   Asthma exacerbation  Acute asthma exacerbation with history of chronic moderate intermittent asthma: Persistent symptoms of wheezing and bronchospasm.  Agree with continuous monitoring in the hospital. bronchodilator therapy, IV steroids, inhalational steroids, scheduled and as needed bronchodilators, deep breathing exercises, incentive spirometry, chest physiotherapy. Supplemental oxygen to keep saturations more than 92%. Mobilize.  Leukocytosis: Probably reactive.  Trending down.  Recheck tomorrow morning.  No evidence of bacterial infection.   DVT prophylaxis: enoxaparin (LOVENOX) injection 40 mg Start: 04/17/20 1000   Code Status: Full code Family Communication: None.  Patient communicating. Disposition Plan: Status is: Inpatient  Remains inpatient appropriate because:Inpatient level of care appropriate due to severity of illness   Dispo: The patient is from: Home              Anticipated d/c is to: Home              Anticipated d/c date is: 2 days              Patient currently is not medically stable to d/c.         Consultants:   None  Procedures:   None  Antimicrobials:   None   Subjective: Patient seen and examined.  Chest tightness and wheezing has mildly improved but he still has a dry cough.  Afebrile.  Anxious.  Objective: Vitals:   04/17/20 0750 04/17/20 0916 04/17/20 1224 04/17/20 1302  BP:   124/64  130/85  Pulse:  87  89  Resp:  16  16  Temp:  98.4 F (36.9 C)  98.8 F (37.1 C)  TempSrc:  Oral    SpO2: 98% 92% 93% 97%  Weight:      Height:        Intake/Output Summary (Last 24 hours) at 04/17/2020 1356 Last data filed at 04/17/2020 0900 Gross per 24 hour  Intake 290 ml  Output --  Net 290 ml   Filed Weights   04/16/20 1530 04/17/20 0100  Weight: 99.8 kg 99.8 kg    Examination:  General exam: Appears calm and comfortable  Respiratory system: Poor air entry.  Bilateral expiratory wheezes. Cardiovascular system: S1 & S2 heard, RRR. No JVD, murmurs, rubs, gallops or clicks. No pedal edema. Gastrointestinal system: Abdomen is nondistended, soft and nontender. No organomegaly or masses felt. Normal bowel sounds heard. Central nervous system: Alert and oriented. No focal neurological deficits. Extremities: Symmetric 5 x 5 power. Skin: No rashes, lesions or ulcers Psychiatry: Judgement and insight appear normal. Mood & affect appropriate.     Data Reviewed: I have personally reviewed following labs and imaging studies  CBC: Recent Labs  Lab 04/16/20 2000 04/17/20 0451  WBC 23.0* 19.9*  NEUTROABS 13.9*  --   HGB 14.4 13.6  HCT 43.5 40.7  MCV 87.3 88.5  PLT 410* 102   Basic Metabolic Panel: Recent Labs  Lab 04/16/20 2000 04/17/20 0451  NA 139 136  K 3.1*  3.6  CL 101 102  CO2 27 20*  GLUCOSE 99 197*  BUN 11 10  CREATININE 0.80 0.78  CALCIUM 8.8* 8.9   GFR: Estimated Creatinine Clearance: 101.9 mL/min (by C-G formula based on SCr of 0.78 mg/dL). Liver Function Tests: No results for input(s): AST, ALT, ALKPHOS, BILITOT, PROT, ALBUMIN in the last 168 hours. No results for input(s): LIPASE, AMYLASE in the last 168 hours. No results for input(s): AMMONIA in the last 168 hours. Coagulation Profile: No results for input(s): INR, PROTIME in the last 168 hours. Cardiac Enzymes: No results for input(s): CKTOTAL, CKMB, CKMBINDEX, TROPONINI in the last 168  hours. BNP (last 3 results) No results for input(s): PROBNP in the last 8760 hours. HbA1C: No results for input(s): HGBA1C in the last 72 hours. CBG: No results for input(s): GLUCAP in the last 168 hours. Lipid Profile: No results for input(s): CHOL, HDL, LDLCALC, TRIG, CHOLHDL, LDLDIRECT in the last 72 hours. Thyroid Function Tests: No results for input(s): TSH, T4TOTAL, FREET4, T3FREE, THYROIDAB in the last 72 hours. Anemia Panel: No results for input(s): VITAMINB12, FOLATE, FERRITIN, TIBC, IRON, RETICCTPCT in the last 72 hours. Sepsis Labs: No results for input(s): PROCALCITON, LATICACIDVEN in the last 168 hours.  Recent Results (from the past 240 hour(s))  Resp Panel by RT-PCR (Flu A&B, Covid) Nasopharyngeal Swab     Status: None   Collection Time: 04/16/20  9:06 PM   Specimen: Nasopharyngeal Swab; Nasopharyngeal(NP) swabs in vial transport medium  Result Value Ref Range Status   SARS Coronavirus 2 by RT PCR NEGATIVE NEGATIVE Final    Comment: (NOTE) SARS-CoV-2 target nucleic acids are NOT DETECTED.  The SARS-CoV-2 RNA is generally detectable in upper respiratory specimens during the acute phase of infection. The lowest concentration of SARS-CoV-2 viral copies this assay can detect is 138 copies/mL. A negative result does not preclude SARS-Cov-2 infection and should not be used as the sole basis for treatment or other patient management decisions. A negative result may occur with  improper specimen collection/handling, submission of specimen other than nasopharyngeal swab, presence of viral mutation(s) within the areas targeted by this assay, and inadequate number of viral copies(<138 copies/mL). A negative result must be combined with clinical observations, patient history, and epidemiological information. The expected result is Negative.  Fact Sheet for Patients:  BloggerCourse.com  Fact Sheet for Healthcare Providers:   SeriousBroker.it  This test is no t yet approved or cleared by the [place] FDA and  has been authorized for detection and/or diagnosis of SARS-CoV-2 by FDA under an Emergency Use Authorization (EUA). This EUA will remain  in effect (meaning this test can be used) for the duration of the COVID-19 declaration under Section 564(b)(1) of the Act, 21 U.S.C.section 360bbb-3(b)(1), unless the authorization is terminated  or revoked sooner.       Influenza A by PCR NEGATIVE NEGATIVE Final   Influenza B by PCR NEGATIVE NEGATIVE Final    Comment: (NOTE) The Xpert Xpress SARS-CoV-2/FLU/RSV plus assay is intended as an aid in the diagnosis of influenza from Nasopharyngeal swab specimens and should not be used as a sole basis for treatment. Nasal washings and aspirates are unacceptable for Xpert Xpress SARS-CoV-2/FLU/RSV testing.  Fact Sheet for Patients: BloggerCourse.com  Fact Sheet for Healthcare Providers: SeriousBroker.it  This test is not yet approved or cleared by the [place] FDA and has been authorized for detection and/or diagnosis of SARS-CoV-2 by FDA under an Emergency Use Authorization (EUA). This EUA will remain in effect (meaning  this test can be used) for the duration of the COVID-19 declaration under Section 564(b)(1) of the Act, 21 U.S.C. section 360bbb-3(b)(1), unless the authorization is terminated or revoked.  Performed at [facility], [address], [place], [place] [address]          Radiology Studies: [facility] Chest Port 1 View  Result Date: 04/16/2020 CLINICAL DATA:  Persistent asthma.  Negative COVID. EXAM: PORTABLE CHEST 1 VIEW COMPARISON:  04/12/2020 FINDINGS: The heart size and mediastinal contours are within normal limits. Both lungs are clear. The visualized skeletal structures are unremarkable. IMPRESSION: No active disease. Electronically Signed    By: Burman Nieves M.D.   On: 04/16/2020 20:27        Scheduled Meds: . budesonide (PULMICORT) nebulizer solution  0.25 mg Nebulization BID  . enoxaparin (LOVENOX) injection  40 mg Subcutaneous Q24H  . fluticasone  2 spray Each Nare Daily  . ipratropium-albuterol  3 mL Nebulization Q4H  . methylPREDNISolone (SOLU-MEDROL) injection  40 mg Intravenous Q12H  . montelukast  10 mg Oral QHS   Continuous Infusions:   LOS: 1 day    Time spent: 35 minutes    Dorcas Carrow, MD [facility] Pager [phone]

## 2020-04-18 LAB — CBC WITH DIFFERENTIAL/PLATELET
Abs Immature Granulocytes: 0.69 10*3/uL — ABNORMAL HIGH (ref 0.00–0.07)
Basophils Absolute: 0.1 10*3/uL (ref 0.0–0.1)
Basophils Relative: 0 %
Eosinophils Absolute: 0 10*3/uL (ref 0.0–0.5)
Eosinophils Relative: 0 %
HCT: 39.9 % (ref 36.0–46.0)
Hemoglobin: 13 g/dL (ref 12.0–15.0)
Immature Granulocytes: 2 %
Lymphocytes Relative: 8 %
Lymphs Abs: 2.4 10*3/uL (ref 0.7–4.0)
MCH: 28.9 pg (ref 26.0–34.0)
MCHC: 32.6 g/dL (ref 30.0–36.0)
MCV: 88.7 fL (ref 80.0–100.0)
Monocytes Absolute: 2.2 10*3/uL — ABNORMAL HIGH (ref 0.1–1.0)
Monocytes Relative: 7 %
Neutro Abs: 24.2 10*3/uL — ABNORMAL HIGH (ref 1.7–7.7)
Neutrophils Relative %: 83 %
Platelets: 368 10*3/uL (ref 150–400)
RBC: 4.5 MIL/uL (ref 3.87–5.11)
RDW: 15.7 % — ABNORMAL HIGH (ref 11.5–15.5)
WBC: 29.6 10*3/uL — ABNORMAL HIGH (ref 4.0–10.5)
nRBC: 0 % (ref 0.0–0.2)

## 2020-04-18 MED ORDER — AMOXICILLIN-POT CLAVULANATE 875-125 MG PO TABS
1.0000 | ORAL_TABLET | Freq: Two times a day (BID) | ORAL | Status: DC
  Administered 2020-04-18 – 2020-04-19 (×3): 1 via ORAL
  Filled 2020-04-18 (×3): qty 1

## 2020-04-18 MED ORDER — IPRATROPIUM-ALBUTEROL 0.5-2.5 (3) MG/3ML IN SOLN
3.0000 mL | Freq: Four times a day (QID) | RESPIRATORY_TRACT | Status: DC
  Administered 2020-04-18 – 2020-04-19 (×3): 3 mL via RESPIRATORY_TRACT
  Filled 2020-04-18 (×3): qty 3

## 2020-04-18 MED ORDER — PREDNISONE 20 MG PO TABS
40.0000 mg | ORAL_TABLET | Freq: Every day | ORAL | Status: DC
  Administered 2020-04-19: 40 mg via ORAL
  Filled 2020-04-18: qty 2

## 2020-04-18 NOTE — Progress Notes (Signed)
PROGRESS NOTE    Wendy Morgan  POE:[id number] DOB: 19-Sep-1975 DOA: 04/16/2020 PCP: Claiborne Rigg, NP    Brief Narrative:  45 year old female with history of moderate intermittent asthma presented to the ER with ongoing shortness of breath and wheezing.  Visited ER multiple times.  Could not feel up prescriptions as the clinics were closed.  In the emergency room diffusely wheezing.  Chest x-ray normal.  Blood gas analysis normal.  Given persistent shortness of breath was admitted to the hospital.   Assessment & Plan:   Principal Problem:   Asthma exacerbation  Acute asthma exacerbation with history of chronic moderate intermittent asthma: Persistent symptoms of wheezing and bronchospasm.   bronchodilator therapy, IV steroids, inhalational steroids, scheduled and as needed bronchodilators, deep breathing exercises, incentive spirometry, chest physiotherapy. Supplemental oxygen to keep saturations more than 92%. Start mobility, more frequent chest physiotherapy. Patient today with thick crusty sputum, will start patient on antibiotics.  Leukocytosis: Likely from steroid use, however patient today with more thick rusty sputum.  Will treat with 5 days of Augmentin therapy.   DVT prophylaxis: enoxaparin (LOVENOX) injection 40 mg Start: 04/17/20 1000   Code Status: Full code Family Communication: None.  Patient communicating. 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              Patient currently is not medically stable to d/c.         Consultants:   None  Procedures:   None  Antimicrobials:   Augmentin, 1/2---   Subjective: Patient seen and examined.  Wheezing and chest tightness is somehow better, she has ongoing cough and darkish  sputum production.  Gets wheezy on mobility.  Objective: Vitals:   04/18/20 0403 04/18/20 0418 04/18/20 0745 04/18/20 1109  BP:  114/68    Pulse:  97    Resp:  18    Temp:  97.8 F (36.6 C)    TempSrc:  Oral    SpO2: 99% 94% 93% 94%  Weight:      Height:        Intake/Output Summary (Last 24 hours) at 04/18/2020 1259 Last data filed at 04/18/2020 0900 Gross per 24 hour  Intake 120 ml  Output --  Net 120 ml   Filed Weights   04/16/20 1530 04/17/20 0100  Weight: 99.8 kg 99.8 kg    Examination:  General exam: Appears calm and comfortable at rest. Respiratory system: Poor air entry.  Bilateral expiratory wheezes. Cardiovascular system: S1 & S2 heard, RRR. No JVD, murmurs, rubs, gallops or clicks. No pedal edema. Gastrointestinal system: Abdomen is nondistended, soft and nontender. No organomegaly or masses felt. Normal bowel sounds heard. Central nervous system: Alert and oriented. No focal neurological deficits. Extremities: Symmetric 5 x 5 power. Skin: No rashes, lesions or ulcers Psychiatry: Judgement and insight appear normal. Mood & affect appropriate.     Data Reviewed: I have personally reviewed following labs and imaging studies  CBC: Recent Labs  Lab 04/16/20 2000 04/17/20 0451 04/18/20 0531  WBC 23.0* 19.9* 29.6*  NEUTROABS 13.9*  --  24.2*  HGB 14.4 13.6 13.0  HCT 43.5 40.7 39.9  MCV 87.3 88.5 88.7  PLT 410* 386 720   Basic Metabolic Panel: Recent Labs  Lab 04/16/20 2000 04/17/20 0451  NA 139 136  K 3.1* 3.6  CL 101 102  CO2 27 20*  GLUCOSE 99 197*  BUN 11 10  CREATININE 0.80 0.78  CALCIUM 8.8* 8.9   GFR: Estimated Creatinine Clearance: 101.9 mL/min (by C-G formula based on SCr of 0.78 mg/dL). Liver Function Tests: No results for input(s): AST, ALT, ALKPHOS, BILITOT, PROT, ALBUMIN in the last 168 hours. No results for input(s): LIPASE, AMYLASE in the last 168 hours. No results for input(s): AMMONIA in the last 168 hours. Coagulation  Profile: No results for input(s): INR, PROTIME in the last 168 hours. Cardiac Enzymes: No results for input(s): CKTOTAL, CKMB, CKMBINDEX, TROPONINI in the last 168 hours. BNP (last 3 results) No results for input(s): PROBNP in the last 8760 hours. HbA1C: No results for input(s): HGBA1C in the last 72 hours. CBG: No results for input(s): GLUCAP in the last 168 hours. Lipid Profile: No results for input(s): CHOL, HDL, LDLCALC, TRIG, CHOLHDL, LDLDIRECT in the last 72 hours. Thyroid Function Tests: No results for input(s): TSH, T4TOTAL, FREET4, T3FREE, THYROIDAB in the last 72 hours. Anemia Panel: No results for input(s): VITAMINB12, FOLATE, FERRITIN, TIBC, IRON, RETICCTPCT in the last 72 hours. Sepsis Labs: No results for input(s): PROCALCITON, LATICACIDVEN in the last 168 hours.  Recent Results (from the past 240 hour(s))  Resp Panel by RT-PCR (Flu A&B, Covid) Nasopharyngeal Swab     Status: None   Collection Time: 04/16/20  9:06 PM   Specimen: Nasopharyngeal Swab; Nasopharyngeal(NP) swabs in vial transport medium  Result Value Ref Range Status   SARS Coronavirus 2 by RT PCR NEGATIVE NEGATIVE Final    Comment: (NOTE) SARS-CoV-2 target nucleic acids are NOT DETECTED.  The SARS-CoV-2 RNA is generally detectable in upper respiratory specimens during the acute phase of infection. The lowest concentration of SARS-CoV-2 viral copies this assay can detect is 138 copies/mL. A negative result does not preclude SARS-Cov-2 infection and should not be used as the sole basis for treatment or other patient management decisions. A negative result may occur with  improper specimen collection/handling, submission of specimen other than nasopharyngeal swab, presence of viral mutation(s) within the areas targeted by this assay, and inadequate number of viral copies(<138 copies/mL). A negative result must be combined with clinical observations, patient history, and epidemiological information. The  expected result is Negative.  Fact Sheet for Patients:  EntrepreneurPulse.com.au  Fact Sheet for Healthcare Providers:  IncredibleEmployment.be  This test is no t yet approved or cleared by the [place] FDA and  has been authorized for detection and/or diagnosis of SARS-CoV-2 by FDA under an Emergency Use Authorization (EUA). This EUA will remain  in effect (meaning this test can be used) for the duration of the COVID-19 declaration under Section 564(b)(1) of the Act, 21 U.S.C.section 360bbb-3(b)(1), unless the authorization is terminated  or revoked sooner.       Influenza A by PCR NEGATIVE NEGATIVE Final   Influenza B by PCR NEGATIVE NEGATIVE Final    Comment: (NOTE) The Xpert Xpress SARS-CoV-2/FLU/RSV plus assay is intended as an aid in the diagnosis of influenza from Nasopharyngeal swab specimens and should not be used as a sole basis for treatment. Nasal washings and aspirates are unacceptable for Xpert Xpress SARS-CoV-2/FLU/RSV testing.  Fact Sheet for Patients: EntrepreneurPulse.com.au  Fact Sheet for Healthcare Providers: IncredibleEmployment.be  This test is not yet approved or cleared by the [place] and  has been authorized for detection and/or diagnosis of SARS-CoV-2 by FDA under an Emergency Use Authorization (EUA). This EUA will remain in effect (meaning this test can be used) for the duration of the COVID-19 declaration under Section 564(b)(1) of the Act, 21 U.S.C. section 360bbb-3(b)(1), unless the authorization is terminated or revoked.  Performed at [facility], [address], [place], [place] [address]          Radiology Studies: [facility] Chest Port 1 View  Result Date: 04/16/2020 CLINICAL DATA:  Persistent asthma.  Negative COVID. EXAM: PORTABLE CHEST 1 VIEW COMPARISON:  04/12/2020 FINDINGS: The heart size and mediastinal contours are within  normal limits. Both lungs are clear. The visualized skeletal structures are unremarkable. IMPRESSION: No active disease. Electronically Signed   By: Burman Nieves M.D.   On: 04/16/2020 20:27        Scheduled Meds: . amoxicillin-clavulanate  1 tablet Oral Q12H  . budesonide (PULMICORT) nebulizer solution  0.25 mg Nebulization BID  . enoxaparin (LOVENOX) injection  40 mg Subcutaneous Q24H  . fluticasone  2 spray Each Nare Daily  . ipratropium-albuterol  3 mL Nebulization Q4H  . montelukast  10 mg Oral QHS  . [START ON 04/19/2020] predniSONE  40 mg Oral Q breakfast   Continuous Infusions:   LOS: 2 days    Time spent: 30 minutes    Dorcas Carrow, MD [facility] Pager [phone]

## 2020-04-18 NOTE — Progress Notes (Signed)
Received 1823 from 4E.  Patient alert and oriented, skin warm and dry.  No complaints or concerns voiced or appreciated.

## 2020-04-19 ENCOUNTER — Other Ambulatory Visit (HOSPITAL_COMMUNITY): Payer: Self-pay | Admitting: Family Medicine

## 2020-04-19 DIAGNOSIS — J45901 Unspecified asthma with (acute) exacerbation: Secondary | ICD-10-CM

## 2020-04-19 LAB — CBC WITH DIFFERENTIAL/PLATELET
Abs Immature Granulocytes: 0.62 10*3/uL — ABNORMAL HIGH (ref 0.00–0.07)
Basophils Absolute: 0.1 10*3/uL (ref 0.0–0.1)
Basophils Relative: 0 %
Eosinophils Absolute: 0.7 10*3/uL — ABNORMAL HIGH (ref 0.0–0.5)
Eosinophils Relative: 3 %
HCT: 39.9 % (ref 36.0–46.0)
Hemoglobin: 13.1 g/dL (ref 12.0–15.0)
Immature Granulocytes: 2 %
Lymphocytes Relative: 20 %
Lymphs Abs: 5.2 10*3/uL — ABNORMAL HIGH (ref 0.7–4.0)
MCH: 29.2 pg (ref 26.0–34.0)
MCHC: 32.8 g/dL (ref 30.0–36.0)
MCV: 89.1 fL (ref 80.0–100.0)
Monocytes Absolute: 2.4 10*3/uL — ABNORMAL HIGH (ref 0.1–1.0)
Monocytes Relative: 9 %
Neutro Abs: 17.7 10*3/uL — ABNORMAL HIGH (ref 1.7–7.7)
Neutrophils Relative %: 66 %
Platelets: 351 10*3/uL (ref 150–400)
RBC: 4.48 MIL/uL (ref 3.87–5.11)
RDW: 15.7 % — ABNORMAL HIGH (ref 11.5–15.5)
WBC: 26.8 10*3/uL — ABNORMAL HIGH (ref 4.0–10.5)
nRBC: 0 % (ref 0.0–0.2)

## 2020-04-19 MED ORDER — DULERA 200-5 MCG/ACT IN AERO: 2.0000 | INHALATION_SPRAY | Freq: Two times a day (BID) | RESPIRATORY_TRACT | 1 refills | Status: DC

## 2020-04-19 MED ORDER — PANTOPRAZOLE SODIUM 40 MG PO TBEC: 40.0000 mg | DELAYED_RELEASE_TABLET | Freq: Every day | ORAL | Status: DC | PRN

## 2020-04-19 MED ORDER — ALBUTEROL SULFATE HFA 108 (90 BASE) MCG/ACT IN AERS: 2.0000 | INHALATION_SPRAY | RESPIRATORY_TRACT | 1 refills | Status: DC | PRN

## 2020-04-19 MED ORDER — AMOXICILLIN-POT CLAVULANATE 875-125 MG PO TABS: 1.0000 | ORAL_TABLET | Freq: Two times a day (BID) | ORAL | 0 refills | Status: DC

## 2020-04-19 MED FILL — ?AMOX-CLAV 875-125 MG TABL: 875-125 | 4 days supply | Qty: 8 | Fill #0

## 2020-04-19 MED FILL — ALBUTEROL SULFATE HFA 108 (: 108 (90 BAS | 16 days supply | Qty: 18 | Fill #0

## 2020-04-19 NOTE — Discharge Summary (Signed)
Physician Discharge Summary  Wendy Morgan MPN:[id number] DOB: Jan 20, 1976 DOA: 04/16/2020  PCP: Claiborne Rigg, NP  Admit date: 04/16/2020 Discharge date: 04/19/2020  Recommendations for Outpatient Follow-up:  1. F/u asthma exacerbation    Follow-up Information    Claiborne Rigg, NP. Schedule an appointment as soon as possible for a visit in 1 week(s).   Specialty: Nurse Practitioner Why: if not completely better Contact information: [address] [place] [address] [phone]                Discharge Diagnoses: Principal diagnosis is #1 Principal Problem:   Asthma exacerbation   Discharge Condition: improved Disposition: home  Diet recommendation:  Diet Orders (From admission, onward)    Start     Ordered   04/19/20 0000  Diet general        04/19/20 1013   04/16/20 2337  Diet regular Room service appropriate? Yes; Fluid consistency: Thin  Diet effective now       Question Answer Comment  Room service appropriate? Yes   Fluid consistency: Thin      04/16/20 2338           Filed Weights   04/16/20 1530 04/17/20 0100 04/18/20 1311  Weight: 99.8 kg 99.8 kg 101.1 kg    HPI/Hospital Course:   45 year old woman PMH asthma, presented with shortness of breath, seen in the emergency department 3 times for asthma exacerbation, was unable to fill prescriptions over the holiday weekend, was admitted 12/31 for asthma exacerbation.  Treated with standard therapy, developed thick sputum and was started on empiric antibiotics.  Gradually improved, some wheezing but respiratory status appears stable, patient feels better and ready for discharge.  Expect will do well in the outpatient setting.  Already has a prescription for steroids.  Discharged with short course of antibiotics.  Hospitalization uncomplicated.  Today's assessment: S: CC: f/u asthma  Feels better, breathing better.   O: Vitals:  Vitals:   04/19/20 0641 04/19/20 0823  BP: 115/72    Pulse: 65   Resp: 18   Temp: 97.9 F (36.6 C)   SpO2: 95% 97%    Constitutional:  . Appears calm and comfortable ENMT:  . grossly normal hearing  Respiratory:  . Bilateral exp wheeze, no r/r. Good air movement. Marland Kitchen Respiratory effort normal.  Cardiovascular:  . RRR, no m/r/g Psychiatric:  . judgement and insight appear normal . Mental status o Mood, affect appropriate   WBC down to 26.8  Discharge Instructions  Discharge Instructions    Diet general   Complete by: As directed    Discharge instructions   Complete by: As directed    Call your physician or seek immediate medical attention for shortness of breath, wheezing, fever, pain or worsening of condition.   Increase activity slowly   Complete by: As directed      Allergies as of 04/19/2020   No Known Allergies     Medication List    STOP taking these medications   Incruse Ellipta 62.5 MCG/INH Aepb Generic drug: umeclidinium bromide   Olopatadine HCl 0.2 % Soln     TAKE these medications   albuterol (2.5 MG/3ML) 0.083% nebulizer solution Commonly known as: PROVENTIL Take 6 mLs (5 mg total) by nebulization every 6 (six) hours as needed for wheezing or shortness of breath.   albuterol 108 (90 Base) MCG/ACT inhaler Commonly known as: VENTOLIN HFA Inhale 2 puffs into the lungs every 4 (four) hours as needed for wheezing or shortness  of breath.   amoxicillin-clavulanate 875-125 MG tablet Commonly known as: AUGMENTIN Take 1 tablet by mouth every 12 (twelve) hours.   benzonatate 100 MG capsule Commonly known as: TESSALON Take 1 capsule (100 mg total) by mouth every 8 (eight) hours.   cetirizine 10 MG tablet Commonly known as: ZYRTEC Take 1 tablet (10 mg total) by mouth daily.   Dulera 200-5 MCG/ACT Aero Generic drug: mometasone-formoterol Inhale 2 puffs into the lungs 2 (two) times daily.   fluticasone 50 MCG/ACT nasal spray Commonly known as: FLONASE Place 2 sprays into both nostrils in the  morning and at bedtime.   montelukast 10 MG tablet Commonly known as: Singulair Take 1 tablet (10 mg total) by mouth at bedtime.   pantoprazole 40 MG tablet Commonly known as: PROTONIX Take 1 tablet (40 mg total) by mouth daily as needed (reflux/heartburn).   predniSONE 10 MG tablet Commonly known as: DELTASONE Take 4 tablets (40 mg total) by mouth daily with breakfast.      No Known Allergies  The results of significant diagnostics from this hospitalization (including imaging, microbiology, ancillary and laboratory) are listed below for reference.    Significant Diagnostic Studies: DG Chest 2 View  Result Date: 04/12/2020 CLINICAL DATA:  Asthma EXAM: CHEST - 2 VIEW.  Slightly rotated patient on frontal view. COMPARISON:  Chest x-ray 07/20/2019, CT chest 07/25/2019 FINDINGS: The heart size and mediastinal contours are unchanged. No focal consolidation. No pulmonary edema. No pleural effusion. No pneumothorax. No acute osseous abnormality. IMPRESSION: No active cardiopulmonary disease. Electronically Signed   By: Tish Frederickson M.D.   On: 04/12/2020 19:58   DG Chest Port 1 View  Result Date: 04/16/2020 CLINICAL DATA:  Persistent asthma.  Negative COVID. EXAM: PORTABLE CHEST 1 VIEW COMPARISON:  04/12/2020 FINDINGS: The heart size and mediastinal contours are within normal limits. Both lungs are clear. The visualized skeletal structures are unremarkable. IMPRESSION: No active disease. Electronically Signed   By: Burman Nieves M.D.   On: 04/16/2020 20:27    Microbiology: Recent Results (from the past 240 hour(s))  Resp Panel by RT-PCR (Flu A&B, Covid) Nasopharyngeal Swab     Status: None   Collection Time: 04/16/20  9:06 PM   Specimen: Nasopharyngeal Swab; Nasopharyngeal(NP) swabs in vial transport medium  Result Value Ref Range Status   SARS Coronavirus 2 by RT PCR NEGATIVE NEGATIVE Final    Comment: (NOTE) SARS-CoV-2 target nucleic acids are NOT DETECTED.  The SARS-CoV-2  RNA is generally detectable in upper respiratory specimens during the acute phase of infection. The lowest concentration of SARS-CoV-2 viral copies this assay can detect is 138 copies/mL. A negative result does not preclude SARS-Cov-2 infection and should not be used as the sole basis for treatment or other patient management decisions. A negative result may occur with  improper specimen collection/handling, submission of specimen other than nasopharyngeal swab, presence of viral mutation(s) within the areas targeted by this assay, and inadequate number of viral copies(<138 copies/mL). A negative result must be combined with clinical observations, patient history, and epidemiological information. The expected result is Negative.  Fact Sheet for Patients:  BloggerCourse.com  Fact Sheet for Healthcare Providers:  SeriousBroker.it  This test is no t yet approved or cleared by the [place] FDA and  has been authorized for detection and/or diagnosis of SARS-CoV-2 by FDA under an Emergency Use Authorization (EUA). This EUA will remain  in effect (meaning this test can be used) for the duration of the COVID-19 declaration under Section 564(b)(1)  of the Act, 21 U.S.C.section 360bbb-3(b)(1), unless the authorization is terminated  or revoked sooner.       Influenza A by PCR NEGATIVE NEGATIVE Final   Influenza B by PCR NEGATIVE NEGATIVE Final    Comment: (NOTE) The Xpert Xpress SARS-CoV-2/FLU/RSV plus assay is intended as an aid in the diagnosis of influenza from Nasopharyngeal swab specimens and should not be used as a sole basis for treatment. Nasal washings and aspirates are unacceptable for Xpert Xpress SARS-CoV-2/FLU/RSV testing.  Fact Sheet for Patients: EntrepreneurPulse.com.au  Fact Sheet for Healthcare Providers: IncredibleEmployment.be  This test is not yet approved or cleared by the  [place] FDA and has been authorized for detection and/or diagnosis of SARS-CoV-2 by FDA under an Emergency Use Authorization (EUA). This EUA will remain in effect (meaning this test can be used) for the duration of the COVID-19 declaration under Section 564(b)(1) of the Act, 21 U.S.C. section 360bbb-3(b)(1), unless the authorization is terminated or revoked.  Performed at [facility], [place] [address], [place], [place] [address]      Labs: Basic Metabolic Panel: Recent Labs  Lab 04/16/20 2000 04/17/20 0451  NA 139 136  K 3.1* 3.6  CL 101 102  CO2 27 20*  GLUCOSE 99 197*  BUN 11 10  CREATININE 0.80 0.78  CALCIUM 8.8* 8.9   CBC: Recent Labs  Lab 04/16/20 2000 04/17/20 0451 04/18/20 0531 04/19/20 0606  WBC 23.0* 19.9* 29.6* 26.8*  NEUTROABS 13.9*  --  24.2* 17.7*  HGB 14.4 13.6 13.0 13.1  HCT 43.5 40.7 39.9 39.9  MCV 87.3 88.5 88.7 89.1  PLT 410* 386 368 351    Principal Problem:   Asthma exacerbation   Time coordinating discharge: 35 minutes  Signed:  Murray Hodgkins, MD  [facility]  04/19/2020, 11:09 AM

## 2020-04-19 NOTE — Hospital Course (Signed)
45 year old woman PMH asthma, presented with shortness of breath, seen in the emergency department 3 times for asthma exacerbation, was unable to fill prescriptions over the holiday weekend, was admitted 12/31 for asthma exacerbation.  Treated with standard therapy, developed thick sputum and was started on empiric antibiotics.

## 2020-04-20 ENCOUNTER — Telehealth: Payer: Self-pay

## 2020-04-20 NOTE — Telephone Encounter (Signed)
Transition Care Management Unsuccessful Follow-up Telephone Call  Date of discharge and from where:  04/19/2020, [facility]  Attempts:  1st Attempt  Reason for unsuccessful TCM follow-up call:  Left voice message - call placed to # [phone].  Need to discuss scheduling a hospital follow up appointment with PCP

## 2020-04-21 ENCOUNTER — Telehealth: Payer: Self-pay

## 2020-04-21 NOTE — Telephone Encounter (Signed)
Transition Care Management Follow-up Telephone Call  Date of discharge and from where: 04/19/2020, [facility]   How have you been since you were released from the hospital? She said she is " doing good."  Any questions or concerns? No  Items Reviewed:  Did the pt receive and understand the discharge instructions provided? Yes   Medications obtained and verified? Yes  - she said she has all of her medications and did not have any questions about her med regime.   Other? No   Any new allergies since your discharge? No   Do you have support at home? Yes   Home Care and Equipment/Supplies: Were home health services ordered? No If so, what is the name of the agency? n/a Has the agency set up a time to come to the patient's home?  n/a Were any new equipment or medical supplies ordered?  No What is the name of the medical supply agency? n/a Were you able to get the supplies/equipment? n/a Do you have any questions related to the use of the equipment or supplies? No   She already had a nebulizer at home   Functional Questionnaire: (I = Independent and D = Dependent) ADLs: independent  Follow up appointments reviewed:   PCP Hospital f/u appt confirmed? Yes  Bertram Denver, NP 05/03/2020.   Specialist Hospital f/u appt confirmed? none scheduled at this time   Are transportation arrangements needed? No   If their condition worsens, is the pt aware to call PCP or go to the Emergency Dept.?  yes  Was the patient provided with contact information for the PCP's office or ED?  She has the number for [facility]  Was to pt encouraged to call back with questions or concerns? yes

## 2020-05-03 ENCOUNTER — Other Ambulatory Visit: Payer: Self-pay

## 2020-05-03 ENCOUNTER — Ambulatory Visit: Payer: Self-pay | Attending: Nurse Practitioner | Admitting: Nurse Practitioner

## 2020-05-03 ENCOUNTER — Encounter: Payer: Self-pay | Admitting: Nurse Practitioner

## 2020-05-03 ENCOUNTER — Other Ambulatory Visit: Payer: Self-pay | Admitting: Nurse Practitioner

## 2020-05-03 DIAGNOSIS — J45909 Unspecified asthma, uncomplicated: Secondary | ICD-10-CM

## 2020-05-03 DIAGNOSIS — J4521 Mild intermittent asthma with (acute) exacerbation: Secondary | ICD-10-CM

## 2020-05-03 MED ORDER — CETIRIZINE HCL 10 MG PO TABS
10.0000 mg | ORAL_TABLET | Freq: Every day | ORAL | 3 refills | Status: DC
Start: 2020-05-03 — End: 2020-05-03

## 2020-05-03 MED ORDER — MONTELUKAST SODIUM 10 MG PO TABS
10.0000 mg | ORAL_TABLET | Freq: Every day | ORAL | 3 refills | Status: DC
Start: 2020-05-03 — End: 2020-05-03

## 2020-05-03 MED ORDER — PANTOPRAZOLE SODIUM 40 MG PO TBEC
40.0000 mg | DELAYED_RELEASE_TABLET | Freq: Every day | ORAL | 1 refills | Status: DC
Start: 2020-05-03 — End: 2020-05-03

## 2020-05-03 MED ORDER — UMECLIDINIUM BROMIDE 62.5 MCG/INH IN AEPB: 1.0000 | INHALATION_SPRAY | Freq: Every day | RESPIRATORY_TRACT | 2 refills | Status: DC

## 2020-05-03 MED ORDER — FLUTICASONE PROPIONATE 50 MCG/ACT NA SUSP: 2.0000 | Freq: Two times a day (BID) | NASAL | 3 refills | Status: DC

## 2020-05-03 MED FILL — PANTOPRAZOLE SOD DR 40 MG T: 40 | 30 days supply | Qty: 30 | Fill #0

## 2020-05-03 MED FILL — MONTELUKAST SOD 10 MG TAB: 10 | 30 days supply | Qty: 30 | Fill #0

## 2020-05-03 MED FILL — CETIRIZINE HCL 10 MG TABLET: 10 | 30 days supply | Qty: 30 | Fill #0

## 2020-05-03 NOTE — Progress Notes (Signed)
Virtual Visit via Telephone Note Due to national recommendations of social distancing due to COVID 19, telehealth visit is felt to be most appropriate for this patient at this time.  I discussed the limitations, risks, security and privacy concerns of performing an evaluation and management service by telephone and the availability of in person appointments. I also discussed with the patient that there may be a patient responsible charge related to this service. The patient expressed understanding and agreed to proceed.    I connected with Wendy Morgan on 05/03/20  at  10:50 AM EST  EDT by telephone and verified that I am speaking with the correct person using two identifiers.   Consent I discussed the limitations, risks, security and privacy concerns of performing an evaluation and management service by telephone and the availability of in person appointments. I also discussed with the patient that there may be a patient responsible charge related to this service. The patient expressed understanding and agreed to proceed.   Location of Patient: Private  Residence   Location of Provider: [facility]    Persons participating in Telemedicine visit: Bertram Denver FNP-BC YY [place] CMA Doreene Burke Brett Fairy    History of Present Illness: Telemedicine visit for: Hospital Follow up She was admitted to the hospital for asthma exacerbation on 12-31 and discharged on January 3.  She presented with shortness of after being unable to fill her asthma medications over the holiday weekend.  She was treated with Augmentin (white blood count 23), duo nebs, p.o. prednisone, IV Solu-Medrol and discharged home in stable condition. She has been seen in the emergency room for asthma exacerbation 3 times since December 27. She has had numerous ED visits for asthma over the past year.  She was evaluated by our Pulmonologist previously and prescribed the following:  Flonase 2 sprays  BID Dulera 2 inhalations twice daily  Will taper prednisone down to 20 mg a day and hold Protonix 40 mg daily and follow-up with her reflux diet Singulair 10 mg daily Incruse 1 inhalation daily   Today she denies any worsening cough, shortness of breath or wheezing. Has no issues or concerns.    Past Medical History:  Diagnosis Date  . Asthma   . Bronchiectasis with acute exacerbation [facility])     Past Surgical History:  Procedure Laterality Date  . TUBAL LIGATION      Family History  Problem Relation Age of Onset  . Healthy Mother   . Healthy Father   . Kidney disease Neg Hx   . Anxiety disorder Neg Hx   . Depression Neg Hx     Social History   Socioeconomic History  . Marital status: Single    Spouse name: Not on file  . Number of children: Not on file  . Years of education: Not on file  . Highest education level: Not on file  Occupational History  . Not on file  Tobacco Use  . Smoking status: Former Smoker    Quit date: 1998    Years since quitting: 24.0  . Smokeless tobacco: Never Used  Vaping Use  . Vaping Use: Never used  Substance and Sexual Activity  . Alcohol use: No  . Drug use: No  . Sexual activity: Yes    Birth control/protection: None  Other Topics Concern  . Not on file  Social History Narrative  . Not on file   Social Determinants of Health   Financial Resource Strain: Not on file  Food Insecurity: Not on file  Transportation Needs: Not on file  Physical Activity: Not on file  Stress: Not on file  Social Connections: Not on file     Observations/Objective: Awake, alert and oriented x 3   ROS  Assessment and Plan: Wendy Morgan was seen today for hospitalization follow-up.  Diagnoses and all orders for this visit:  Poorly controlled intermittent asthma with acute exacerbation -     montelukast (SINGULAIR) 10 MG tablet; Take 1 tablet (10 mg total) by mouth at bedtime. -     pantoprazole (PROTONIX) 40 MG tablet; Take 1 tablet (40 mg  total) by mouth daily. -     fluticasone (FLONASE) 50 MCG/ACT nasal spray; Place 2 sprays into both nostrils in the morning and at bedtime. -     umeclidinium bromide (INCRUSE ELLIPTA) 62.5 MCG/INH AEPB; Inhale 1 puff into the lungs daily. NEEDS PASS  Asthma due to seasonal allergies -     fluticasone (FLONASE) 50 MCG/ACT nasal spray; Place 2 sprays into both nostrils in the morning and at bedtime. -     cetirizine (ZYRTEC) 10 MG tablet; Take 1 tablet (10 mg total) by mouth daily.     Follow Up Instructions Return in about 3 months (around 08/01/2020).     I discussed the assessment and treatment plan with the patient. The patient was provided an opportunity to ask questions and all were answered. The patient agreed with the plan and demonstrated an understanding of the instructions.   The patient was advised to call back or seek an in-person evaluation if the symptoms worsen or if the condition fails to improve as anticipated.  I provided 9 minutes of non-face-to-face time during this encounter including median intraservice time, reviewing previous notes, labs, imaging, medications and explaining diagnosis and management.  Claiborne Rigg, FNP-BC

## 2020-05-04 MED FILL — INCRUSE ELLIPTA 62.5 MCG IN: 62.5 | 30 days supply | Qty: 30 | Fill #0

## 2020-05-11 ENCOUNTER — Ambulatory Visit: Payer: Self-pay

## 2020-05-19 ENCOUNTER — Inpatient Hospital Stay: Payer: Self-pay | Admitting: Nurse Practitioner

## 2020-06-01 ENCOUNTER — Ambulatory Visit: Payer: Self-pay | Admitting: Nurse Practitioner

## 2020-07-26 MED FILL — Umeclidinium Br Aero Powd Breath Act 62.5 MCG/ACT (Base Eq): RESPIRATORY_TRACT | 90 days supply | Qty: 30 | Fill #0 | Status: AC

## 2020-07-26 MED FILL — Mometasone Furoate-Formoterol Fumarate Aerosol 200-5 MCG/ACT: RESPIRATORY_TRACT | 30 days supply | Qty: 13 | Fill #0 | Status: AC

## 2020-07-27 ENCOUNTER — Other Ambulatory Visit: Payer: Self-pay

## 2020-08-05 ENCOUNTER — Other Ambulatory Visit: Payer: Self-pay

## 2020-08-10 ENCOUNTER — Other Ambulatory Visit: Payer: Self-pay

## 2020-10-29 ENCOUNTER — Other Ambulatory Visit: Payer: Self-pay

## 2020-10-31 MED FILL — Fluticasone Propionate Nasal Susp 50 MCG/ACT: NASAL | 30 days supply | Qty: 16 | Fill #0 | Status: AC

## 2020-10-31 MED FILL — Umeclidinium Br Aero Powd Breath Act 62.5 MCG/ACT (Base Eq): RESPIRATORY_TRACT | 30 days supply | Qty: 30 | Fill #1 | Status: AC

## 2020-10-31 MED FILL — Cetirizine HCl Tab 10 MG: ORAL | Qty: 90 | Fill #0 | Status: CN

## 2020-10-31 MED FILL — Albuterol Sulfate Inhal Aero 108 MCG/ACT (90MCG Base Equiv): RESPIRATORY_TRACT | 25 days supply | Qty: 8.5 | Fill #0 | Status: AC

## 2020-10-31 MED FILL — Mometasone Furoate-Formoterol Fumarate Aerosol 200-5 MCG/ACT: RESPIRATORY_TRACT | 30 days supply | Qty: 13 | Fill #1 | Status: AC

## 2020-11-01 ENCOUNTER — Other Ambulatory Visit: Payer: Self-pay

## 2020-12-13 MED FILL — Umeclidinium Br Aero Powd Breath Act 62.5 MCG/ACT (Base Eq): RESPIRATORY_TRACT | 30 days supply | Qty: 30 | Fill #2 | Status: CN

## 2020-12-13 MED FILL — Albuterol Sulfate Inhal Aero 108 MCG/ACT (90MCG Base Equiv): RESPIRATORY_TRACT | 25 days supply | Qty: 8.5 | Fill #1 | Status: CN

## 2020-12-14 ENCOUNTER — Other Ambulatory Visit: Payer: Self-pay | Admitting: Nurse Practitioner

## 2020-12-14 ENCOUNTER — Other Ambulatory Visit: Payer: Self-pay

## 2020-12-14 DIAGNOSIS — J4521 Mild intermittent asthma with (acute) exacerbation: Secondary | ICD-10-CM

## 2020-12-14 MED FILL — Umeclidinium Br Aero Powd Breath Act 62.5 MCG/ACT (Base Eq): RESPIRATORY_TRACT | 30 days supply | Qty: 30 | Fill #2 | Status: AC

## 2020-12-14 MED FILL — Pantoprazole Sodium EC Tab 40 MG (Base Equiv): ORAL | 30 days supply | Qty: 30 | Fill #0 | Status: CN

## 2020-12-14 NOTE — Telephone Encounter (Signed)
Requested medication (s) are due for refill today: Yes  Requested medication (s) are on the active medication list: Yes  Last refill:  04/19/20  Future visit scheduled:No  Notes to clinic:  Unable to refill per protocol, last refill by another provider.      Requested Prescriptions  Pending Prescriptions Disp Refills   albuterol (VENTOLIN HFA) 108 (90 Base) MCG/ACT inhaler 17 g 1    Sig: INHALE 2 PUFFS INTO THE LUNGS EVERY 4 (FOUR) HOURS AS NEEDED FOR WHEEZING OR SHORTNESS OF BREATH.     Pulmonology:  Beta Agonists Failed - 12/14/2020  9:00 AM      Failed - One inhaler should last at least one month. If the patient is requesting refills earlier, contact the patient to check for uncontrolled symptoms.      Passed - Valid encounter within last 12 months    Recent Outpatient Visits           7 months ago Poorly controlled intermittent asthma with acute exacerbation   [place] [facility] [place], Shea Stakes, NP   1 year ago Severe persistent asthma with exacerbation   [place] [facility] Storm Frisk, MD   1 year ago Hospital discharge follow-up   [facility] Claiborne Rigg, NP   1 year ago Hospital discharge follow-up   [facility] Claiborne Rigg, NP   2 years ago Asthma due to seasonal allergies   [facility] Claiborne Rigg, NP

## 2020-12-14 NOTE — Telephone Encounter (Signed)
Requested medication (s) are due for refill today - no  Requested medication (s) are on the active medication list -yes  Future visit scheduled -no  Last refill: today  Notes to clinic: Rx sent for review- pharmacy requested changes: Sig  Requested Prescriptions  Pending Prescriptions Disp Refills   umeclidinium bromide (INCRUSE ELLIPTA) 62.5 MCG/INH AEPB 30 each 2    Sig: INHALE 1 PUFF INTO THE LUNGS DAILY.     Off-Protocol Failed - 12/14/2020 11:21 AM      Failed - Medication not assigned to a protocol, review manually.      Passed - Valid encounter within last 12 months    Recent Outpatient Visits           7 months ago Poorly controlled intermittent asthma with acute exacerbation   [place] [facility] [place], Shea Stakes, NP   1 year ago Severe persistent asthma with exacerbation   [place] [facility] Storm Frisk, MD   1 year ago Hospital discharge follow-up   [facility] Claiborne Rigg, NP   1 year ago Hospital discharge follow-up   [facility] [place], [place] W, NP   2 years ago Asthma due to seasonal allergies   [facility] [place], Shea Stakes, NP                 Requested Prescriptions  Pending Prescriptions Disp Refills   umeclidinium bromide (INCRUSE ELLIPTA) 62.5 MCG/INH AEPB 30 each 2    Sig: INHALE 1 PUFF INTO THE LUNGS DAILY.     Off-Protocol Failed - 12/14/2020 11:21 AM      Failed - Medication not assigned to a protocol, review manually.      Passed - Valid encounter within last 12 months    Recent Outpatient Visits           7 months ago Poorly controlled intermittent asthma with acute exacerbation   [place] [facility] [place], Shea Stakes, NP   1 year ago Severe persistent asthma with exacerbation   [place] [facility] Storm Frisk, MD   1 year ago Hospital  discharge follow-up   [facility] Claiborne Rigg, NP   1 year ago Hospital discharge follow-up   [facility] Claiborne Rigg, NP   2 years ago Asthma due to seasonal allergies   [facility] Claiborne Rigg, NP

## 2020-12-16 MED ORDER — ALBUTEROL SULFATE HFA 108 (90 BASE) MCG/ACT IN AERS
2.0000 | INHALATION_SPRAY | RESPIRATORY_TRACT | 0 refills | Status: DC | PRN
  Filled 2020-12-16: qty 18, 16d supply, fill #0

## 2020-12-16 MED ORDER — INCRUSE ELLIPTA 62.5 MCG/INH IN AEPB
1.0000 | INHALATION_SPRAY | Freq: Every day | RESPIRATORY_TRACT | 1 refills | Status: DC
  Filled 2020-12-16: qty 30, 30d supply, fill #0

## 2020-12-17 ENCOUNTER — Other Ambulatory Visit: Payer: Self-pay

## 2020-12-21 ENCOUNTER — Other Ambulatory Visit: Payer: Self-pay

## 2020-12-24 ENCOUNTER — Other Ambulatory Visit: Payer: Self-pay

## 2021-01-15 ENCOUNTER — Other Ambulatory Visit: Payer: Self-pay

## 2021-01-15 ENCOUNTER — Emergency Department (HOSPITAL_COMMUNITY)
Admission: EM | Admit: 2021-01-15 | Discharge: 2021-01-15 | Disposition: A | Discharge status: Home / Self Care | Payer: Self-pay | Attending: Emergency Medicine | Admitting: Emergency Medicine

## 2021-01-15 ENCOUNTER — Encounter (HOSPITAL_COMMUNITY): Payer: Self-pay

## 2021-01-15 DIAGNOSIS — Z7951 Long term (current) use of inhaled steroids: Secondary | ICD-10-CM | POA: Insufficient documentation

## 2021-01-15 DIAGNOSIS — Z87891 Personal history of nicotine dependence: Secondary | ICD-10-CM | POA: Insufficient documentation

## 2021-01-15 DIAGNOSIS — J45909 Unspecified asthma, uncomplicated: Secondary | ICD-10-CM

## 2021-01-15 DIAGNOSIS — J040 Acute laryngitis: Secondary | ICD-10-CM | POA: Insufficient documentation

## 2021-01-15 DIAGNOSIS — J45901 Unspecified asthma with (acute) exacerbation: Secondary | ICD-10-CM | POA: Insufficient documentation

## 2021-01-15 DIAGNOSIS — Z20822 Contact with and (suspected) exposure to covid-19: Secondary | ICD-10-CM | POA: Insufficient documentation

## 2021-01-15 LAB — RESP PANEL BY RT-PCR (FLU A&B, COVID) ARPGX2
Influenza A by PCR: NEGATIVE
Influenza B by PCR: NEGATIVE
SARS Coronavirus 2 by RT PCR: NEGATIVE

## 2021-01-15 MED ORDER — PREDNISONE 10 MG PO TABS: 40.0000 mg | ORAL_TABLET | Freq: Every day | ORAL | 0 refills | Status: AC

## 2021-01-15 MED ORDER — PREDNISONE 20 MG PO TABS
60.0000 mg | ORAL_TABLET | Freq: Once | ORAL | Status: AC
  Administered 2021-01-15: 60 mg via ORAL
  Filled 2021-01-15: qty 3

## 2021-01-15 MED ORDER — ALBUTEROL SULFATE HFA 108 (90 BASE) MCG/ACT IN AERS
2.0000 | INHALATION_SPRAY | Freq: Once | RESPIRATORY_TRACT | Status: AC
  Administered 2021-01-15: 2 via RESPIRATORY_TRACT
  Filled 2021-01-15: qty 6.7

## 2021-01-15 NOTE — Discharge Instructions (Addendum)
Use the inhaler every 4 hours while awake for the next 2 days.  After this, use as needed for shortness of breath, chest tightness, wheezing. Take prednisone as prescribed. Make sure you stay well-hydrated with water. Follow with your primary care doctor as needed for recheck of your symptoms. Return to the emergency room if develop increased difficulty breathing, persistent shortness of breath, chest pain, any new, worsening, or concerning symptoms.

## 2021-01-15 NOTE — ED Triage Notes (Signed)
Pt arrived via POV, c/o asthma, states she ran out of inhalers, looking for refill.

## 2021-01-15 NOTE — ED Provider Notes (Signed)
[facility]-EMERGENCY DEPT Provider Note   CSN: [id number] Arrival date & time: 01/15/21  1411     History Chief Complaint  Patient presents with   Medication Refill    Wendy Morgan is a 45 y.o. female presenting for evaluation of laryngitis, wheezing, shortness of breath.  Patient states that the past week she has had cold-like symptoms including laryngitis and mild nasal congestion.  In the past few days, she has developed increasing asthma symptoms including wheezing and chest tightness.  She is in nonproductive cough which is consistent with her normal asthma.  She denies fevers, chest pain, nausea, vomiting, abdominal pain.  She denies sick contacts.  She is vaccinated for COVID, has not received the boosters.  She has no other medical problems.  She ran out of her inhaler prior to this event, has not been able to use any.  Has not taken any over-the-counter medications  HPI     Past Medical History:  Diagnosis Date   Asthma    Bronchiectasis with acute exacerbation [facility])     Patient Active Problem List   Diagnosis Date Noted   Acute recurrent maxillary sinusitis 07/25/2019   Allergic rhinitis with postnasal drip 07/22/2019   Acute recurrent pansinusitis 07/22/2019   Dental caries 07/22/2019   Asthma exacerbation 05/29/2018    Past Surgical History:  Procedure Laterality Date   TUBAL LIGATION       OB History   No obstetric history on file.     Family History  Problem Relation Age of Onset   Healthy Mother    Healthy Father    Kidney disease Neg Hx    Anxiety disorder Neg Hx    Depression Neg Hx     Social History   Tobacco Use   Smoking status: Former    Types: Cigarettes    Quit date: 1998    Years since quitting: 24.7   Smokeless tobacco: Never  Vaping Use   Vaping Use: Never used  Substance Use Topics   Alcohol use: No   Drug use: No    Home Medications Prior to Admission medications   Medication Sig Start Date  End Date Taking? Authorizing Provider  predniSONE (DELTASONE) 10 MG tablet Take 4 tablets (40 mg total) by mouth daily for 4 days. 01/16/21 01/20/21 Yes Markee Remlinger, PA-C  albuterol (PROVENTIL) (2.5 MG/3ML) 0.083% nebulizer solution Take 6 mLs (5 mg total) by nebulization every 6 (six) hours as needed for wheezing or shortness of breath. 04/13/20   Elpidio Anis, PA-C  albuterol (VENTOLIN HFA) 108 (90 Base) MCG/ACT inhaler Inhale 2 puffs into the lungs every 4 (four) hours as needed for wheezing or shortness of breath. 12/16/20 12/16/21  Hoy Register, MD  cetirizine (ZYRTEC) 10 MG tablet TAKE 1 TABLET (10 MG TOTAL) BY MOUTH DAILY. 05/03/20 05/03/21  Claiborne Rigg, NP  fluticasone (FLONASE) 50 MCG/ACT nasal spray PLACE 2 SPRAYS INTO BOTH NOSTRILS IN THE MORNING AND AT BEDTIME. 05/03/20 05/03/21  Claiborne Rigg, NP  mometasone-formoterol (DULERA) 200-5 MCG/ACT AERO INHALE 2 PUFFS INTO THE LUNGS 2 (TWO) TIMES DAILY. 04/19/20 04/19/21  Standley Brooking, MD  montelukast (SINGULAIR) 10 MG tablet TAKE 1 TABLET (10 MG TOTAL) BY MOUTH AT BEDTIME. 05/03/20 05/03/21  Claiborne Rigg, NP  pantoprazole (PROTONIX) 40 MG tablet TAKE 1 TABLET (40 MG TOTAL) BY MOUTH DAILY. 05/03/20 05/03/21  Claiborne Rigg, NP  umeclidinium bromide (INCRUSE ELLIPTA) 62.5 MCG/INH AEPB Inhale 1 puff into the lungs daily.  12/16/20 02/14/21  Hoy Register, MD    Allergies    Patient has no known allergies.  Review of Systems   Review of Systems  HENT:  Positive for congestion and voice change.   Respiratory:  Positive for cough, chest tightness, shortness of breath and wheezing.   All other systems reviewed and are negative.  Physical Exam Updated Vital Signs BP (!) 142/89 (BP Location: Left Arm)   Pulse 84   Temp 98.2 F (36.8 C) (Oral)   Resp 18   SpO2 95%   Physical Exam Vitals and nursing note reviewed.  Constitutional:      General: She is not in acute distress.    Appearance: Normal appearance.     Comments:  Resting comfortably in the bed in no acute distress  HENT:     Head: Normocephalic and atraumatic.     Comments: Raspy voice, but not muffled.  OP clear without tonsillar swelling or exudate.  TMs nonerythematous nonbulging bilaterally. Eyes:     Conjunctiva/sclera: Conjunctivae normal.     Pupils: Pupils are equal, round, and reactive to light.  Cardiovascular:     Rate and Rhythm: Normal rate and regular rhythm.     Pulses: Normal pulses.  Pulmonary:     Effort: Pulmonary effort is normal. No respiratory distress.     Breath sounds: Wheezing present.     Comments: Speaking in full sentences.  Expiratory wheezing in all fields.  Sats in the mid 90s on room air. Abdominal:     General: There is no distension.     Palpations: Abdomen is soft. There is no mass.     Tenderness: There is abdominal tenderness. There is no guarding or rebound.  Musculoskeletal:        General: Normal range of motion.     Cervical back: Normal range of motion and neck supple.  Skin:    General: Skin is warm and dry.     Capillary Refill: Capillary refill takes less than 2 seconds.  Neurological:     Mental Status: She is alert and oriented to person, place, and time.  Psychiatric:        Mood and Affect: Mood and affect normal.        Speech: Speech normal.        Behavior: Behavior normal.    ED Results / Procedures / Treatments   Labs (all labs ordered are listed, but only abnormal results are displayed) Labs Reviewed  RESP PANEL BY RT-PCR (FLU A&B, COVID) [id number]    EKG None  Radiology No results found.  Procedures Procedures   Medications Ordered in ED Medications  albuterol (VENTOLIN HFA) 108 (90 Base) MCG/ACT inhaler 2 puff (2 puffs Inhalation Given 01/15/21 1456)  predniSONE (DELTASONE) tablet 60 mg (60 mg Oral Given 01/15/21 1456)    ED Course  I have reviewed the triage vital signs and the nursing notes.  Pertinent labs & imaging results that were available during my care of  the patient were reviewed by me and considered in my medical decision making (see chart for details).    MDM Rules/Calculators/A&P                           Patient presenting for evaluation of asthma symptoms.  On exam, patient appears nontoxic.  She does have wheezing in all fields.  Recent URI symptoms, likely viral and is triggering asthma.  Will test for COVID and flu.  Will treat asthma and reassess.  As patient is afebrile and without a productive cough and without pneumonialike lung sounds, will hold off on chest x-ray.  On reevaluation, wheezing much improved.  Patient states she is feeling better.  Discussed continued use of albuterol and prednisone.  At this time, patient appears safe for discharge.  Return precautions given.  Patient states she understands and agrees to plan  Final Clinical Impression(s) / ED Diagnoses Final diagnoses:  Asthma, unspecified asthma severity, unspecified whether complicated, unspecified whether persistent  Laryngitis    Rx / DC Orders ED Discharge Orders          Ordered    predniSONE (DELTASONE) 10 MG tablet  Daily        01/15/21 1510             Nelsy Madonna, PA-C 01/15/21 1512    Ernie Avena, MD 01/15/21 1844

## 2021-01-22 ENCOUNTER — Other Ambulatory Visit: Payer: Self-pay

## 2021-01-22 ENCOUNTER — Emergency Department (HOSPITAL_COMMUNITY): Payer: Self-pay

## 2021-01-22 ENCOUNTER — Emergency Department (HOSPITAL_COMMUNITY)
Admission: EM | Admit: 2021-01-22 | Discharge: 2021-01-22 | Disposition: A | Discharge status: Home / Self Care | Payer: Self-pay | Attending: Emergency Medicine | Admitting: Emergency Medicine

## 2021-01-22 ENCOUNTER — Encounter (HOSPITAL_COMMUNITY): Payer: Self-pay | Admitting: Emergency Medicine

## 2021-01-22 DIAGNOSIS — J45901 Unspecified asthma with (acute) exacerbation: Secondary | ICD-10-CM

## 2021-01-22 DIAGNOSIS — Z87891 Personal history of nicotine dependence: Secondary | ICD-10-CM | POA: Insufficient documentation

## 2021-01-22 DIAGNOSIS — J4521 Mild intermittent asthma with (acute) exacerbation: Secondary | ICD-10-CM | POA: Insufficient documentation

## 2021-01-22 MED ORDER — INCRUSE ELLIPTA 62.5 MCG/INH IN AEPB: 1.0000 | INHALATION_SPRAY | Freq: Every day | RESPIRATORY_TRACT | 1 refills | Status: AC

## 2021-01-22 MED ORDER — PREDNISONE 20 MG PO TABS
60.0000 mg | ORAL_TABLET | Freq: Once | ORAL | Status: AC
  Administered 2021-01-22: 60 mg via ORAL
  Filled 2021-01-22: qty 3

## 2021-01-22 MED ORDER — ALBUTEROL (5 MG/ML) CONTINUOUS INHALATION SOLN
10.0000 mg/h | INHALATION_SOLUTION | Freq: Once | RESPIRATORY_TRACT | Status: AC
  Administered 2021-01-22: 10 mg/h via RESPIRATORY_TRACT
  Filled 2021-01-22: qty 20

## 2021-01-22 MED ORDER — PREDNISONE 10 MG (21) PO TBPK: ORAL_TABLET | Freq: Every day | ORAL | 0 refills | Status: DC

## 2021-01-22 MED ORDER — ALBUTEROL SULFATE HFA 108 (90 BASE) MCG/ACT IN AERS: 2.0000 | INHALATION_SPRAY | RESPIRATORY_TRACT | 0 refills | Status: DC | PRN

## 2021-01-22 NOTE — ED Triage Notes (Signed)
Patient here from home reporting asthma exacerbation. Reports that she ran out of inhalers, need refill.

## 2021-01-22 NOTE — ED Notes (Signed)
Respiratory called for cont nebulization trt.

## 2021-01-22 NOTE — ED Provider Notes (Signed)
[facility]-EMERGENCY DEPT Provider Note   CSN: [id number] Arrival date & time: 01/22/21  0533     History Chief Complaint  Patient presents with   Cough   Asthma    Wendy Morgan is a 45 y.o. female with history of asthma.  Patient presents to the emergency department with a chief complaint of wheezing, shortness of breath, and cough.  Patient reports that symptoms have been present since 10/1.  Patient reports that her symptoms improved while taking prednisone.  Patient reports that she completed her present own prescription yesterday.  Since waking this morning patient has had constant shortness of breath and wheezing.  Patient reports increased cough this morning.  Patient reports cough is producing clear mucus.  Patient reports this is normal of her asthma exacerbation.  Patient reports that she is currently out of her albuterol rescue inhaler.  Patient went through albuterol rescue inhaler over the last 7 days.  Patient reports that she has been out of her steroid inhaler over the last 3 weeks.  Denies any known sick contacts.  Has been vaccinated for COVID-19 and influenza.   Cough Associated symptoms: shortness of breath   Associated symptoms: no chest pain, no chills, no fever, no headaches, no rash, no rhinorrhea and no sore throat   Asthma Associated symptoms include shortness of breath. Pertinent negatives include no chest pain, no abdominal pain and no headaches.      Past Medical History:  Diagnosis Date   Asthma    Bronchiectasis with acute exacerbation [facility])     Patient Active Problem List   Diagnosis Date Noted   Acute recurrent maxillary sinusitis 07/25/2019   Allergic rhinitis with postnasal drip 07/22/2019   Acute recurrent pansinusitis 07/22/2019   Dental caries 07/22/2019   Asthma exacerbation 05/29/2018    Past Surgical History:  Procedure Laterality Date   TUBAL LIGATION       OB History   No obstetric history on file.      Family History  Problem Relation Age of Onset   Healthy Mother    Healthy Father    Kidney disease Neg Hx    Anxiety disorder Neg Hx    Depression Neg Hx     Social History   Tobacco Use   Smoking status: Former    Types: Cigarettes    Quit date: 1998    Years since quitting: 24.7   Smokeless tobacco: Never  Vaping Use   Vaping Use: Never used  Substance Use Topics   Alcohol use: No   Drug use: No    Home Medications Prior to Admission medications   Medication Sig Start Date End Date Taking? Authorizing Provider  albuterol (PROVENTIL) (2.5 MG/3ML) 0.083% nebulizer solution Take 6 mLs (5 mg total) by nebulization every 6 (six) hours as needed for wheezing or shortness of breath. 04/13/20   Elpidio Anis, PA-C  albuterol (VENTOLIN HFA) 108 (90 Base) MCG/ACT inhaler Inhale 2 puffs into the lungs every 4 (four) hours as needed for wheezing or shortness of breath. 12/16/20 12/16/21  Hoy Register, MD  cetirizine (ZYRTEC) 10 MG tablet TAKE 1 TABLET (10 MG TOTAL) BY MOUTH DAILY. 05/03/20 05/03/21  Claiborne Rigg, NP  fluticasone (FLONASE) 50 MCG/ACT nasal spray PLACE 2 SPRAYS INTO BOTH NOSTRILS IN THE MORNING AND AT BEDTIME. 05/03/20 05/03/21  Claiborne Rigg, NP  mometasone-formoterol (DULERA) 200-5 MCG/ACT AERO INHALE 2 PUFFS INTO THE LUNGS 2 (TWO) TIMES DAILY. 04/19/20 04/19/21  Standley Brooking, MD  montelukast (SINGULAIR) 10 MG tablet TAKE 1 TABLET (10 MG TOTAL) BY MOUTH AT BEDTIME. 05/03/20 05/03/21  Claiborne Rigg, NP  pantoprazole (PROTONIX) 40 MG tablet TAKE 1 TABLET (40 MG TOTAL) BY MOUTH DAILY. 05/03/20 05/03/21  Claiborne Rigg, NP  umeclidinium bromide (INCRUSE ELLIPTA) 62.5 MCG/INH AEPB Inhale 1 puff into the lungs daily. 12/16/20 02/14/21  Hoy Register, MD    Allergies    Patient has no known allergies.  Review of Systems   Review of Systems  Constitutional:  Negative for chills and fever.  HENT:  Negative for congestion, rhinorrhea and sore throat.   Eyes:   Negative for visual disturbance.  Respiratory:  Positive for cough and shortness of breath.   Cardiovascular:  Negative for chest pain, palpitations and leg swelling.  Gastrointestinal:  Negative for abdominal pain, nausea and vomiting.  Musculoskeletal:  Negative for back pain and neck pain.  Skin:  Negative for color change and rash.  Neurological:  Negative for dizziness, syncope, light-headedness and headaches.  Psychiatric/Behavioral:  Negative for confusion.    Physical Exam Updated Vital Signs BP 123/90   Pulse 64   Temp 98 F (36.7 C) (Oral)   Resp 15   SpO2 98%   Physical Exam Vitals and nursing note reviewed.  Constitutional:      General: She is not in acute distress.    Appearance: She is not ill-appearing, toxic-appearing or diaphoretic.  HENT:     Head: Normocephalic.  Eyes:     General: No scleral icterus.       Right eye: No discharge.        Left eye: No discharge.  Cardiovascular:     Rate and Rhythm: Normal rate.  Pulmonary:     Effort: Pulmonary effort is normal. No tachypnea, bradypnea or respiratory distress.     Breath sounds: No stridor. Examination of the right-upper field reveals wheezing. Examination of the left-upper field reveals wheezing. Examination of the right-middle field reveals wheezing. Examination of the left-middle field reveals wheezing. Examination of the right-lower field reveals wheezing. Examination of the left-lower field reveals wheezing. Wheezing present. No decreased breath sounds, rhonchi or rales.     Comments: Patient speaks in full complete sentences without difficulty.  Audible wheezing noted.  Patient has expiratory wheezing noted to all lung fields. Skin:    General: Skin is warm and dry.  Neurological:     General: No focal deficit present.     Mental Status: She is alert.     GCS: GCS eye subscore is 4. GCS verbal subscore is 5. GCS motor subscore is 6.  Psychiatric:        Behavior: Behavior is cooperative.    ED  Results / Procedures / Treatments   Labs (all labs ordered are listed, but only abnormal results are displayed) Labs Reviewed - No data to display  EKG None  Radiology No results found.  Procedures Procedures   Medications Ordered in ED Medications - No data to display  ED Course  I have reviewed the triage vital signs and the nursing notes.  Pertinent labs & imaging results that were available during my care of the patient were reviewed by me and considered in my medical decision making (see chart for details).    MDM Rules/Calculators/A&P                           Alert 45 year old female no acute distress, nontoxic appearing.  Presents  to emergency department with chief complaint of wheezing, shortness of breath, and cough.  Symptoms have been present over the last week.  Per chart review patient was seen on 10/1 for similar symptoms.  Was given albuterol inhaler and started on prednisone.  Patient reports she had improvement in symptoms while taking prednisone however after completion symptoms became worse.  Patient reports that she finished her albuterol rescue inhaler over the last 7 days.  Patient is currently noncompliant with inhaled daily corticosteroid.  Patient able to speak in focally sentences without difficulty.  No respiratory distress.  Patient has audible wheezing.  Expiratory wheezing noted to all lung fields.  Plan to give patient prednisone and albuterol nebulizer treatment.  Will reassess after treatment.  Patient describes cough as productive.  Cough is now producing clear mucus.  Will obtain x-ray imaging to evaluate for possible pneumonia.  Influenza and COVID-19 testing performed on 10/1 were negative.  Patient has no URI symptoms, myalgias, fevers or chills low suspicion for influenza or COVID-19 infection at this time.  Chest x-ray shows no active cardiopulmonary disease.  After receiving prednisone and nebulizer treatment patient has resolution of her  asthma.  Patient reports improvement in her breathing.  Patient hemodynamically stable.  Plan to discharge patient with prescription of prednisone taper, albuterol inhaler, and refill of her inhaled corticosteroid.  Patient to follow-up with primary care provider for further management of asthma.  Discussed results, findings, treatment and follow up. Patient advised of return precautions. Patient verbalized understanding and agreed with plan.  Patient care discussed with attending physician Dr.Horton.    Final Clinical Impression(s) / ED Diagnoses Final diagnoses:  Poorly controlled intermittent asthma with acute exacerbation  Exacerbation of asthma, unspecified asthma severity, unspecified whether persistent    Rx / DC Orders ED Discharge Orders          Ordered    umeclidinium bromide (INCRUSE ELLIPTA) 62.5 MCG/INH AEPB  Daily        01/22/21 0914    albuterol (VENTOLIN HFA) 108 (90 Base) MCG/ACT inhaler  Every 4 hours PRN        01/22/21 0914    predniSONE (STERAPRED UNI-PAK 21 TAB) 10 MG (21) TBPK tablet  Daily        01/22/21 0914             Haskel Schroeder, PA-C 01/22/21 0919    Horton, Clabe Seal, DO 01/22/21 1530

## 2021-01-22 NOTE — Discharge Instructions (Signed)
You came to the emergency department today to be assessed for your wheezing.  You received prednisone and nebulized albuterol in the emergency department.  Your symptoms are likely due to an asthma exacerbation.  I have given you a course of steroids, albuterol inhaler, and prescription for your inhaled corticosteroid.  Please follow-up with your primary care provider for further management of your asthma.  Get help right away if: You are getting worse and do not respond to treatment during an asthma attack. You are short of breath when at rest or when doing very little physical activity. You have difficulty eating, drinking, or talking. You have chest pain or tightness. You develop a fast heartbeat or palpitations. You have a bluish color to your lips or fingernails. You are light-headed or dizzy, or you faint. Your peak flow reading is less than 50% of your personal best. You feel too tired to breathe normally.

## 2021-01-28 ENCOUNTER — Ambulatory Visit: Payer: Self-pay | Admitting: Nurse Practitioner

## 2021-02-12 ENCOUNTER — Other Ambulatory Visit: Payer: Self-pay

## 2021-02-12 ENCOUNTER — Emergency Department (HOSPITAL_COMMUNITY): Payer: Self-pay

## 2021-02-12 ENCOUNTER — Encounter (HOSPITAL_COMMUNITY): Payer: Self-pay

## 2021-02-12 ENCOUNTER — Emergency Department (HOSPITAL_COMMUNITY)
Admission: EM | Admit: 2021-02-12 | Discharge: 2021-02-13 | Disposition: A | Discharge status: Home / Self Care | Payer: Self-pay | Attending: Emergency Medicine | Admitting: Emergency Medicine

## 2021-02-12 DIAGNOSIS — Z7951 Long term (current) use of inhaled steroids: Secondary | ICD-10-CM | POA: Insufficient documentation

## 2021-02-12 DIAGNOSIS — Z87891 Personal history of nicotine dependence: Secondary | ICD-10-CM | POA: Insufficient documentation

## 2021-02-12 DIAGNOSIS — J45901 Unspecified asthma with (acute) exacerbation: Secondary | ICD-10-CM | POA: Insufficient documentation

## 2021-02-12 DIAGNOSIS — M25561 Pain in right knee: Secondary | ICD-10-CM | POA: Insufficient documentation

## 2021-02-12 NOTE — ED Triage Notes (Signed)
Pt states that she is having shortness of breath from her asthma flaring up. Pt also complains of right leg pain.

## 2021-02-12 NOTE — ED Provider Notes (Signed)
Emergency Medicine Provider Triage Evaluation Note  CHEYRL BULEY , a 45 y.o. female  was evaluated in triage.  Pt complains of shortness of breath, wheezing.  Also reports right knee pain for the past week.  No injury.  Minimal improvement noted with her home inhalers.  Review of Systems  Positive: Shortness of breath, wheezing Negative: Chest pain  Physical Exam  LMP 01/14/2021  Gen:   Awake, no distress   Resp:  Normal effort  MSK:   Moves extremities without difficulty  Other:  Tenderness palpation of the right knee without changes to range of motion or deformities  Medical Decision Making  Medically screening exam initiated at 10:04 PM.  Appropriate orders placed.  Doreene Burke JAHZARIA VARY was informed that the remainder of the evaluation will be completed by another provider, this initial triage assessment does not replace that evaluation, and the importance of remaining in the ED until their evaluation is complete.  X-rays ordered   Dietrich Pates, PA-C 02/12/21 2207    Cheryll Cockayne, MD 02/16/21 [phone]

## 2021-02-13 ENCOUNTER — Encounter (HOSPITAL_COMMUNITY): Payer: Self-pay | Admitting: Student

## 2021-02-13 MED ORDER — IPRATROPIUM-ALBUTEROL 0.5-2.5 (3) MG/3ML IN SOLN
3.0000 mL | Freq: Once | RESPIRATORY_TRACT | Status: AC
  Administered 2021-02-13: 01:00:00 3 mL via RESPIRATORY_TRACT
  Filled 2021-02-13: qty 3

## 2021-02-13 MED ORDER — PREDNISONE 20 MG PO TABS
60.0000 mg | ORAL_TABLET | Freq: Once | ORAL | Status: AC
  Administered 2021-02-13: 03:00:00 60 mg via ORAL
  Filled 2021-02-13: qty 3

## 2021-02-13 MED ORDER — IPRATROPIUM-ALBUTEROL 0.5-2.5 (3) MG/3ML IN SOLN
3.0000 mL | Freq: Once | RESPIRATORY_TRACT | Status: AC
  Administered 2021-02-13: 02:00:00 3 mL via RESPIRATORY_TRACT
  Filled 2021-02-13: qty 3

## 2021-02-13 MED ORDER — IPRATROPIUM-ALBUTEROL 0.5-2.5 (3) MG/3ML IN SOLN
3.0000 mL | Freq: Once | RESPIRATORY_TRACT | Status: AC
  Administered 2021-02-13: 03:00:00 3 mL via RESPIRATORY_TRACT
  Filled 2021-02-13: qty 3

## 2021-02-13 MED ORDER — PREDNISONE 50 MG PO TABS: 50.0000 mg | ORAL_TABLET | Freq: Every day | ORAL | 0 refills | Status: DC

## 2021-02-13 NOTE — ED Notes (Signed)
D/c paperwork reviewed with pt, including f/u care.  Pt with no questions or concerns at time of d/c. Knee immobilizer placed prior to d/c. Pt declined use of wheelchair to exit dept.

## 2021-02-13 NOTE — Discharge Instructions (Addendum)
You were seen in the emergency department today for problems with your asthma and right knee pain.  Your x-ray of your chest and your right knee were normal.  We are sending you home with a prednisone burst, please try taking this tomorrow morning- 02/14/21 as we gave your first dose in the emergency department.  Take this with food as it can cause stomach upset and or stomach bleeding.  We have prescribed you new medication(s) today. Discuss the medications prescribed today with your pharmacist as they can have adverse effects and interactions with your other medicines including over the counter and prescribed medications. Seek medical evaluation if you start to experience new or abnormal symptoms after taking one of these medicines, seek care immediately if you start to experience difficulty breathing, feeling of your throat closing, facial swelling, or rash as these could be indications of a more serious allergic reaction  Please continue to use your albuterol inhaler at home.  We have provided a knee immobilizer to try to help with your knee pain, keep this in place to help with stability.  Please call the orthopedics office to schedule follow-up.  Follow-up with your primary care provider for recheck of your symptoms.  Return to the emergency department for new or worsening symptoms including but not limited to new or worsening pain, chest pain, increased trouble breathing, coughing up blood, passing out, numbness, weakness, right leg swelling or discoloration, or any other concerns.

## 2021-02-13 NOTE — ED Provider Notes (Signed)
[facility]-EMERGENCY DEPT Provider Note   CSN: [id number] Arrival date & time: 02/12/21  2142     History Chief Complaint  Patient presents with   Shortness of Breath    Wendy Morgan is a 45 y.o. female with a hx of asthma who presents to the ED with complaints of problems with her asthma over the past few days.  Patient states that she has had some congestion, cough, wheezing, and shortness of breath.  Utilizing her inhaler without much relief at home.  No other alleviating or aggravating factors.  She denies fever, chest pain, hemoptysis, or syncope.  She also mentions some right knee pain, states that she woke up with it 1 morning, she does do a lot of squatting and movements at work which may have triggered this, no direct injury, pain is to the medial knee, worse with twisting/turning activities, no alleviating factors.  She denies calf pain, numbness, tingling, weakness, or color change.  HPI     Past Medical History:  Diagnosis Date   Asthma    Bronchiectasis with acute exacerbation [facility])     Patient Active Problem List   Diagnosis Date Noted   Acute recurrent maxillary sinusitis 07/25/2019   Allergic rhinitis with postnasal drip 07/22/2019   Acute recurrent pansinusitis 07/22/2019   Dental caries 07/22/2019   Asthma exacerbation 05/29/2018    Past Surgical History:  Procedure Laterality Date   TUBAL LIGATION       OB History   No obstetric history on file.     Family History  Problem Relation Age of Onset   Healthy Mother    Healthy Father    Kidney disease Neg Hx    Anxiety disorder Neg Hx    Depression Neg Hx     Social History   Tobacco Use   Smoking status: Former    Types: Cigarettes    Quit date: 1998    Years since quitting: 24.8   Smokeless tobacco: Never  Vaping Use   Vaping Use: Never used  Substance Use Topics   Alcohol use: No   Drug use: No    Home Medications Prior to Admission medications    Medication Sig Start Date End Date Taking? Authorizing Provider  albuterol (PROVENTIL) (2.5 MG/3ML) 0.083% nebulizer solution Take 6 mLs (5 mg total) by nebulization every 6 (six) hours as needed for wheezing or shortness of breath. 04/13/20   Elpidio Anis, PA-C  albuterol (VENTOLIN HFA) 108 (90 Base) MCG/ACT inhaler Inhale 2 puffs into the lungs every 4 (four) hours as needed for wheezing or shortness of breath. 01/22/21 01/22/22  Haskel Schroeder, PA-C  cetirizine (ZYRTEC) 10 MG tablet TAKE 1 TABLET (10 MG TOTAL) BY MOUTH DAILY. 05/03/20 05/03/21  Claiborne Rigg, NP  fluticasone (FLONASE) 50 MCG/ACT nasal spray PLACE 2 SPRAYS INTO BOTH NOSTRILS IN THE MORNING AND AT BEDTIME. 05/03/20 05/03/21  Claiborne Rigg, NP  mometasone-formoterol (DULERA) 200-5 MCG/ACT AERO INHALE 2 PUFFS INTO THE LUNGS 2 (TWO) TIMES DAILY. 04/19/20 04/19/21  Standley Brooking, MD  montelukast (SINGULAIR) 10 MG tablet TAKE 1 TABLET (10 MG TOTAL) BY MOUTH AT BEDTIME. 05/03/20 05/03/21  Claiborne Rigg, NP  pantoprazole (PROTONIX) 40 MG tablet TAKE 1 TABLET (40 MG TOTAL) BY MOUTH DAILY. 05/03/20 05/03/21  Claiborne Rigg, NP  predniSONE (STERAPRED UNI-PAK 21 TAB) 10 MG (21) TBPK tablet Take by mouth daily. Take 6 tabs by mouth daily  for 2 days, then 5 tabs for 2  days, then 4 tabs for 2 days, then 3 tabs for 2 days, 2 tabs for 2 days, then 1 tab by mouth daily for 2 days 01/22/21   Haskel Schroeder, PA-C  umeclidinium bromide (INCRUSE ELLIPTA) 62.5 MCG/INH AEPB Inhale 1 puff into the lungs daily. 01/22/21 03/23/21  Haskel Schroeder, PA-C    Allergies    Patient has no known allergies.  Review of Systems   Review of Systems  Constitutional:  Negative for chills and fever.  HENT:  Positive for congestion.   Respiratory:  Positive for cough, shortness of breath and wheezing.   Cardiovascular:  Negative for chest pain and leg swelling.  Gastrointestinal:  Negative for abdominal pain, nausea and vomiting.   Musculoskeletal:  Positive for arthralgias.  Skin:  Negative for color change and wound.  Neurological:  Negative for syncope, weakness and numbness.  All other systems reviewed and are negative.  Physical Exam Updated Vital Signs BP 113/85 (BP Location: Left Arm)   Pulse 74   Temp 98.1 F (36.7 C) (Oral)   Resp (!) 22   Ht 5\' 4"  (1.626 m)   Wt 100.7 kg   LMP 01/14/2021 (Approximate)   SpO2 93%   BMI 38.11 kg/m   Physical Exam Vitals and nursing note reviewed.  Constitutional:      General: She is not in acute distress.    Appearance: She is well-developed. She is not ill-appearing or toxic-appearing.  HENT:     Head: Normocephalic and atraumatic.  Eyes:     General:        Right eye: No discharge.        Left eye: No discharge.     Conjunctiva/sclera: Conjunctivae normal.  Cardiovascular:     Rate and Rhythm: Normal rate and regular rhythm.     Pulses:          Dorsalis pedis pulses are 2+ on the right side and 2+ on the left side.       Posterior tibial pulses are 2+ on the right side and 2+ on the left side.  Pulmonary:     Effort: Pulmonary effort is normal. No respiratory distress.     Breath sounds: Wheezing (Biphasic, more so expiratory) present. No rhonchi or rales.  Abdominal:     General: There is no distension.     Palpations: Abdomen is soft.     Tenderness: There is no abdominal tenderness.  Musculoskeletal:     Cervical back: Neck supple.     Comments: Lower extremities: No obvious deformity, appreciable swelling, edema, erythema, ecchymosis, warmth, or open wounds. Patient has intact AROM to bilateral hips, knees, ankles, and all digits. Tender to palpation to the medial right knee joint line, otherwise nontender.  No obvious joint instability..   Skin:    General: Skin is warm and dry.     Capillary Refill: Capillary refill takes less than 2 seconds.     Findings: No rash.  Neurological:     Mental Status: She is alert.     Comments: Alert. Clear  speech. Sensation grossly intact to bilateral lower extremities. 5/5 strength with knee flexion/extension and ankle plantar/dorsiflexion bilaterally. Patient ambulatory  Psychiatric:        Mood and Affect: Mood normal.        Behavior: Behavior normal.    ED Results / Procedures / Treatments   Labs (all labs ordered are listed, but only abnormal results are displayed) Labs Reviewed - No data to display  EKG EKG Interpretation  Date/Time:  Saturday February 12 2021 22:55:48 EDT Ventricular Rate:  74 PR Interval:  172 QRS Duration: 103 QT Interval:  389 QTC Calculation: 432 R Axis:   48 Text Interpretation: Sinus rhythm Low voltage, precordial leads No significant change was found Confirmed by Glynn Octave [id number]) on 02/12/2021 11:01:42 PM  Radiology DG Chest 2 View  Result Date: 02/12/2021 CLINICAL DATA:  Shortness of breath. EXAM: CHEST - 2 VIEW COMPARISON:  None. FINDINGS: The heart size and mediastinal contours are within normal limits. Both lungs are clear. The visualized skeletal structures are unremarkable. IMPRESSION: No active cardiopulmonary disease. Electronically Signed   By: Aram Candela M.D.   On: 02/12/2021 22:38   DG Knee Complete 4 Views Right  Result Date: 02/12/2021 CLINICAL DATA:  Atraumatic right knee pain. EXAM: RIGHT KNEE - COMPLETE 4+ VIEW COMPARISON:  None. FINDINGS: No evidence of fracture, dislocation, or joint effusion. No evidence of arthropathy or other focal bone abnormality. Soft tissues are unremarkable. IMPRESSION: Negative. Electronically Signed   By: Aram Candela M.D.   On: 02/12/2021 22:38    Procedures Procedures   Medications Ordered in ED Medications - No data to display  ED Course  I have reviewed the triage vital signs and the nursing notes.  Pertinent labs & imaging results that were available during my care of the patient were reviewed by me and considered in my medical decision making (see chart for details).     MDM Rules/Calculators/A&P                           Patient presents to the ED with complaints of problems with her asthma and right knee pain.  Patient is nontoxic, resting comfortably, vitals on my assessment are within normal limits.  Additional history obtained:  Additional history obtained from chart review & nursing note review.   EKG: Sinus rhythm Low voltage, precordial leads No significant change was found   Imaging Studies ordered:  Chest x-ray and right knee x-ray ordered in triage, I independently reviewed, formal radiology impression shows:  CXR:  No active cardiopulmonary disease Right knee x-ray: Negative  ED Course:  Shortness of breath: Chest x-ray without infiltrate to suggest pneumonia, additionally no findings of pulmonary edema or pneumothorax.  Initial lung exam with biphasic wheezing, following initial DuoNeb patient was reassessed, improved remained with expiratory wheezing therefore a second DuoNeb was ordered.  On reevaluation following second DuoNeb remains to the area mild end expiratory wheeze, much improved air movement, will give third DuoNeb treatment and steroids with plan for discharge on steroid burst.  Patient is not hypoxic, overall well-appearing, appears appropriate for discharge in this regard.  She is in agreement.  Right knee pain: X-ray without fracture or dislocation.  There is no edema or calf tenderness to raise concern for DVT.  She has good symmetric pulses without findings to suggest ischemia.  No signs of infection.  Will place in knee immobilizer with orthopedics follow-up.  I discussed results, treatment plan, need for follow-up, and return precautions with the patient. Provided opportunity for questions, patient confirmed understanding and is in agreement with plan.   Portions of this note were generated with Scientist, clinical (histocompatibility and immunogenetics). Dictation errors may occur despite best attempts at proofreading.  Final Clinical Impression(s) / ED  Diagnoses Final diagnoses:  Exacerbation of asthma, unspecified asthma severity, unspecified whether persistent  Acute pain of right knee    Rx / DC Orders  ED Discharge Orders          Ordered    predniSONE (DELTASONE) 50 MG tablet  Daily with breakfast        02/13/21 0248             Cherly Anderson, PA-C 02/13/21 0542    Glynn Octave, MD 02/13/21 [phone]

## 2021-02-14 ENCOUNTER — Other Ambulatory Visit: Payer: Self-pay | Admitting: Family Medicine

## 2021-02-15 ENCOUNTER — Other Ambulatory Visit: Payer: Self-pay

## 2021-02-15 ENCOUNTER — Emergency Department (HOSPITAL_COMMUNITY)
Admission: EM | Admit: 2021-02-15 | Discharge: 2021-02-15 | Disposition: A | Discharge status: Home / Self Care | Payer: Self-pay | Attending: Emergency Medicine | Admitting: Emergency Medicine

## 2021-02-15 ENCOUNTER — Encounter (HOSPITAL_COMMUNITY): Payer: Self-pay | Admitting: Emergency Medicine

## 2021-02-15 DIAGNOSIS — Z5321 Procedure and treatment not carried out due to patient leaving prior to being seen by health care provider: Secondary | ICD-10-CM | POA: Insufficient documentation

## 2021-02-15 DIAGNOSIS — J45909 Unspecified asthma, uncomplicated: Secondary | ICD-10-CM | POA: Insufficient documentation

## 2021-02-15 MED ORDER — IPRATROPIUM-ALBUTEROL 0.5-2.5 (3) MG/3ML IN SOLN
3.0000 mL | Freq: Once | RESPIRATORY_TRACT | Status: AC
  Administered 2021-02-15: 3 mL via RESPIRATORY_TRACT
  Filled 2021-02-15: qty 3

## 2021-02-15 MED ORDER — ALBUTEROL SULFATE HFA 108 (90 BASE) MCG/ACT IN AERS
4.0000 | INHALATION_SPRAY | Freq: Once | RESPIRATORY_TRACT | Status: AC
  Administered 2021-02-15: 4 via RESPIRATORY_TRACT

## 2021-02-15 NOTE — ED Triage Notes (Signed)
Patient with audible wheezing upon walking into triage.  Patient has history of asthma.  Patient states that she is out of her HFA.

## 2021-02-15 NOTE — ED Notes (Signed)
Pt called for vitals no answer. °

## 2021-02-15 NOTE — ED Provider Notes (Signed)
Emergency Medicine Provider Triage Evaluation Note  Wendy Morgan , a 45 y.o. female  was evaluated in triage.  Pt complains of shortness of breath.  She reports associated wheezing.  Reports that her symptoms feel similar to previous asthma exacerbations.  She has been having symptoms for about a week.  She was seen in the ER and discharged with a burst of prednisone, but she finished this several days ago.  She had mild improvement while she was taking prednisone, but after finishing the medication, her symptoms worsen.  She also ran out of her home albuterol inhaler.  No fever, chest pain, leg swelling, sore throat, or URI symptoms.  Review of Systems  Positive: Shortness of breath, wheezing, cough Negative: Chest pain, fever, leg swelling, sore throat, ear pain, abdominal pain  Physical Exam  BP 130/89 (BP Location: Right Arm)   Pulse 76   Temp 97.9 F (36.6 C) (Oral)   Resp 17   SpO2 96%  Gen:   Awake, no distress   Resp:  Normal effort, diffuse inspiratory and expiratory wheezes MSK:   Moves extremities without difficulty  Other:  No peripheral edema  Medical Decision Making  Medically screening exam initiated at 3:01 AM.  Appropriate orders placed.  Doreene Burke LACHRISHA ZIEBARTH was informed that the remainder of the evaluation will be completed by another provider, this initial triage assessment does not replace that evaluation, and the importance of remaining in the ED until their evaluation is complete.  DuoNeb has been ordered.  Symptoms consistent with asthma exacerbation.  No indication for further imaging or labs at this time.  She will require further work-up and evaluation in the emergency department.   Frederik Pear A, PA-C 02/15/21 0303    Shon Baton, MD 02/21/21 [phone]

## 2021-02-22 ENCOUNTER — Encounter (HOSPITAL_COMMUNITY): Payer: Self-pay

## 2021-02-22 ENCOUNTER — Emergency Department (HOSPITAL_COMMUNITY)
Admission: EM | Admit: 2021-02-22 | Discharge: 2021-02-22 | Disposition: A | Discharge status: Home / Self Care | Payer: Self-pay | Attending: Student | Admitting: Student

## 2021-02-22 ENCOUNTER — Emergency Department (HOSPITAL_COMMUNITY): Payer: Self-pay

## 2021-02-22 DIAGNOSIS — J4541 Moderate persistent asthma with (acute) exacerbation: Secondary | ICD-10-CM | POA: Insufficient documentation

## 2021-02-22 DIAGNOSIS — Z87891 Personal history of nicotine dependence: Secondary | ICD-10-CM | POA: Insufficient documentation

## 2021-02-22 DIAGNOSIS — Z7951 Long term (current) use of inhaled steroids: Secondary | ICD-10-CM | POA: Insufficient documentation

## 2021-02-22 MED ORDER — BENZONATATE 100 MG PO CAPS: 100.0000 mg | ORAL_CAPSULE | Freq: Three times a day (TID) | ORAL | 0 refills | Status: DC

## 2021-02-22 MED ORDER — PREDNISONE 10 MG (21) PO TBPK: ORAL_TABLET | Freq: Every day | ORAL | 0 refills | Status: DC

## 2021-02-22 MED ORDER — PREDNISONE 20 MG PO TABS
60.0000 mg | ORAL_TABLET | Freq: Once | ORAL | Status: AC
  Administered 2021-02-22: 60 mg via ORAL
  Filled 2021-02-22: qty 3

## 2021-02-22 MED ORDER — IPRATROPIUM-ALBUTEROL 0.5-2.5 (3) MG/3ML IN SOLN
3.0000 mL | Freq: Once | RESPIRATORY_TRACT | Status: AC
  Administered 2021-02-22: 3 mL via RESPIRATORY_TRACT
  Filled 2021-02-22: qty 3

## 2021-02-22 MED ORDER — ALBUTEROL SULFATE HFA 108 (90 BASE) MCG/ACT IN AERS
2.0000 | INHALATION_SPRAY | Freq: Once | RESPIRATORY_TRACT | Status: AC
  Administered 2021-02-22: 2 via RESPIRATORY_TRACT
  Filled 2021-02-22: qty 6.7

## 2021-02-22 MED ORDER — ALBUTEROL SULFATE (2.5 MG/3ML) 0.083% IN NEBU: 2.5000 mg | INHALATION_SOLUTION | Freq: Four times a day (QID) | RESPIRATORY_TRACT | 12 refills | Status: DC | PRN

## 2021-02-22 NOTE — ED Triage Notes (Signed)
Pt arrived via POV, c/o asthma exacerbation. C/o worsening SOB.

## 2021-02-22 NOTE — Discharge Instructions (Addendum)
Take the steroids at home  I written you for a nebulizer machine with solution  Follow-up with PCP or your pulmonologist  Return for new or worsening symptoms

## 2021-02-22 NOTE — ED Provider Notes (Signed)
Emergency Medicine Provider Triage Evaluation Note  Wendy Morgan , a 45 y.o. female  was evaluated in triage.  Pt complains of SOB. Feels like prior asthma exacerbations. Some non productive cough. No CP, back pain, LE edema. Was on previous steroid burst which helped however sx returned after completion. No recent sick contacts. Denies LE edema , hx of PE, DVT. Has been admitted previously however no prior intubations for asthma  Review of Systems  Positive: SOB, cough, asthma Negative: Fever, emesis  Physical Exam  BP (!) 133/105 (BP Location: Left Arm)   Pulse 76   Temp 98.1 F (36.7 C) (Oral)   Resp 18   SpO2 96%  Gen:   Awake, no distress   Resp:  Inspiratory and expiratory wheeze, speaks in sentences without difficulty MSK:   Moves extremities without difficulty  Other:    Medical Decision Making  Medically screening exam initiated at 9:25 AM.  Appropriate orders placed.  Doreene Burke ALIZZA SACRA was informed that the remainder of the evaluation will be completed by another provider, this initial triage assessment does not replace that evaluation, and the importance of remaining in the ED until their evaluation is complete.  Sob suspect asthma exacerbation  No hypoxia, tachypnea  Work up started   [employer], Valli Glance, PA-C 02/22/21 0929    Glendora Score, MD 02/22/21 1203

## 2021-02-22 NOTE — ED Provider Notes (Signed)
[facility]   CSN: [id number] Arrival date & time: 02/22/21  0913    History Chief Complaint  Patient presents with   Asthma    Wendy Morgan is a 45 y.o. female with past medical history significant for asthma who presents for evaluation of shortness of breath.  Has been seen previously for similar complaints.  Recently completed round of steroids approxi-1.5 weeks ago.  Symptoms had improved however returned when steroids were completed.  She admits to prior hospitalization however no prior intubations for her asthma.  She does have a nonproductive cough.  She denies any fever, chills, chest pain, back pain, abdominal pain, erythema or warmth.  No history of PE or DVT.  No recent sick contacts.  Denies any current pain.  Denies additional aggravating or alleviating factors.  No lower extremity edema, history of CHF, PND orthopnea  Patient obtained from patient and past medical records.  No interpreter used.  HPI     Past Medical History:  Diagnosis Date   Asthma    Bronchiectasis with acute exacerbation [facility])     Patient Active Problem List   Diagnosis Date Noted   Acute recurrent maxillary sinusitis 07/25/2019   Allergic rhinitis with postnasal drip 07/22/2019   Acute recurrent pansinusitis 07/22/2019   Dental caries 07/22/2019   Asthma exacerbation 05/29/2018    Past Surgical History:  Procedure Laterality Date   TUBAL LIGATION       OB History   No obstetric history on file.     Family History  Problem Relation Age of Onset   Healthy Mother    Healthy Father    Kidney disease Neg Hx    Anxiety disorder Neg Hx    Depression Neg Hx     Social History   Tobacco Use   Smoking status: Former    Types: Cigarettes    Quit date: 1998    Years since quitting: 24.8   Smokeless tobacco: Never  Vaping Use   Vaping Use: Never used  Substance Use Topics   Alcohol use: No   Drug use: No    Home  Medications Prior to Admission medications   Medication Sig Start Date End Date Taking? Authorizing Provider  albuterol (PROVENTIL) (2.5 MG/3ML) 0.083% nebulizer solution Take 3 mLs (2.5 mg total) by nebulization every 6 (six) hours as needed for wheezing or shortness of breath. 02/22/21  Yes Eryanna Regal A, PA-C  benzonatate (TESSALON) 100 MG capsule Take 1 capsule (100 mg total) by mouth every 8 (eight) hours. 02/22/21  Yes Jaylee Lantry A, PA-C  predniSONE (STERAPRED UNI-PAK 21 TAB) 10 MG (21) TBPK tablet Take by mouth daily. Take 6 tabs by mouth daily  for 2 days, then 5 tabs for 2 days, then 4 tabs for 2 days, then 3 tabs for 2 days, 2 tabs for 2 days, then 1 tab by mouth daily for 2 days 02/22/21  Yes Gwendola Hornaday A, PA-C  cetirizine (ZYRTEC) 10 MG tablet TAKE 1 TABLET (10 MG TOTAL) BY MOUTH DAILY. 05/03/20 05/03/21  Claiborne Rigg, NP  fluticasone (FLONASE) 50 MCG/ACT nasal spray PLACE 2 SPRAYS INTO BOTH NOSTRILS IN THE MORNING AND AT BEDTIME. 05/03/20 05/03/21  Claiborne Rigg, NP  mometasone-formoterol (DULERA) 200-5 MCG/ACT AERO INHALE 2 PUFFS INTO THE LUNGS 2 (TWO) TIMES DAILY. 04/19/20 04/19/21  Standley Brooking, MD  montelukast (SINGULAIR) 10 MG tablet TAKE 1 TABLET (10 MG TOTAL) BY MOUTH AT BEDTIME. 05/03/20 05/03/21  Meredeth Ide,  Shea Stakes, NP  pantoprazole (PROTONIX) 40 MG tablet TAKE 1 TABLET (40 MG TOTAL) BY MOUTH DAILY. 05/03/20 05/03/21  Claiborne Rigg, NP  umeclidinium bromide (INCRUSE ELLIPTA) 62.5 MCG/INH AEPB Inhale 1 puff into the lungs daily. 01/22/21 03/23/21  Haskel Schroeder, PA-C    Allergies    Patient has no known allergies.  Review of Systems   Review of Systems  Constitutional: Negative.   HENT: Negative.    Respiratory:  Positive for cough, shortness of breath and wheezing.   Cardiovascular: Negative.   Gastrointestinal: Negative.   Genitourinary: Negative.   Musculoskeletal: Negative.   Skin: Negative.   Neurological: Negative.   All other systems  reviewed and are negative.  Physical Exam Updated Vital Signs BP 132/80   Pulse 78   Temp 98.1 F (36.7 C) (Oral)   Resp 18   SpO2 98%   Physical Exam Vitals and nursing note reviewed.  Constitutional:      General: She is not in acute distress.    Appearance: She is well-developed. She is not ill-appearing, toxic-appearing or diaphoretic.  HENT:     Head: Normocephalic and atraumatic.     Nose: Nose normal.     Mouth/Throat:     Mouth: Mucous membranes are moist.  Eyes:     Pupils: Pupils are equal, round, and reactive to light.  Cardiovascular:     Rate and Rhythm: Normal rate.     Pulses: Normal pulses.          Radial pulses are 2+ on the right side and 2+ on the left side.     Heart sounds: Normal heart sounds.  Pulmonary:     Effort: No respiratory distress.     Breath sounds: Wheezing present.     Comments: Inspiratory and expiratory wheeze.  Speaks in full sentences Abdominal:     General: Bowel sounds are normal. There is no distension.     Palpations: Abdomen is soft.     Tenderness: There is no abdominal tenderness. There is no guarding or rebound.  Musculoskeletal:        General: No swelling, tenderness, deformity or signs of injury. Normal range of motion.     Cervical back: Normal range of motion.     Right lower leg: No edema.     Left lower leg: No edema.  Skin:    General: Skin is warm and dry.  Neurological:     General: No focal deficit present.     Mental Status: She is alert.  Psychiatric:        Mood and Affect: Mood normal.    ED Results / Procedures / Treatments   Labs (all labs ordered are listed, but only abnormal results are displayed) Labs Reviewed - No data to display  EKG None  Radiology DG Chest 2 View  Result Date: 02/22/2021 CLINICAL DATA:  Shortness of breath. EXAM: CHEST - 2 VIEW COMPARISON:  February 12, 2021. FINDINGS: The heart size and mediastinal contours are within normal limits. Both lungs are clear. The visualized  skeletal structures are unremarkable. IMPRESSION: No active cardiopulmonary disease. Electronically Signed   By: Lupita Raider M.D.   On: 02/22/2021 09:42    Procedures Procedures   Medications Ordered in ED Medications  albuterol (VENTOLIN HFA) 108 (90 Base) MCG/ACT inhaler 2 puff (2 puffs Inhalation Given 02/22/21 0941)  predniSONE (DELTASONE) tablet 60 mg (60 mg Oral Given 02/22/21 0941)  ipratropium-albuterol (DUONEB) 0.5-2.5 (3) MG/3ML nebulizer solution 3 mL (  3 mLs Nebulization Given 02/22/21 1156)    ED Course  I have reviewed the triage vital signs and the nursing notes.  Pertinent labs & imaging results that were available during my care of the patient were reviewed by me and considered in my medical decision making (see chart for details).  Here for shortness of breath which patient feels is consistent with her prior asthma exacerbations.  She is afebrile, nonseptic appearing.  She does have diffuse inspiratory and expiratory wheeze however she is without tachycardia, tachypnea or hypoxia.  He does not appear clinically fluid overloaded.  She has no risk factors for PE, DVT, PERC negative, Wells criteria low risk  Plan on EKG, chest x-ray, breathing treatment and reassess  Labs and imaging personally reviewed and interpreted: Chest x-ray without cardiomegaly, pulmonary edema, pneumothorax, infiltrate EKG without ischemic changes  Reassessed after albuterol, steroids, will give additional breathing treatment  Reassessed after DuoNeb.  Breath sounds significantly improved.  Does have a very minimal faint expiratory wheeze however patient states she feels like she is at her baseline and request DC home.  Feels is reasonable.  She is ambulatory any hypoxia.  Reassuring imaging, EKG.  Will DC home with longer course of steroids.  Encourage close follow-up with her pulmonologist.    MDM Rules/Calculators/A&P                            Final Clinical Impression(s) / ED  Diagnoses Final diagnoses:  Moderate persistent asthma with exacerbation    Rx / DC Orders ED Discharge Orders          Ordered    For home use only DME Nebulizer machine        02/22/21 1225    albuterol (PROVENTIL) (2.5 MG/3ML) 0.083% nebulizer solution  Every 6 hours PRN        02/22/21 1225    predniSONE (STERAPRED UNI-PAK 21 TAB) 10 MG (21) TBPK tablet  Daily        02/22/21 1225    benzonatate (TESSALON) 100 MG capsule  Every 8 hours        02/22/21 1225             Jahmier Willadsen A, PA-C 02/22/21 1228    Kommor, Wyn Forster, MD 02/22/21 1304

## 2021-03-03 ENCOUNTER — Emergency Department (HOSPITAL_COMMUNITY)
Admission: EM | Admit: 2021-03-03 | Discharge: 2021-03-04 | Disposition: A | Discharge status: Home / Self Care | Payer: Self-pay | Attending: Emergency Medicine | Admitting: Emergency Medicine

## 2021-03-03 ENCOUNTER — Encounter (HOSPITAL_COMMUNITY): Payer: Self-pay | Admitting: Radiology

## 2021-03-03 ENCOUNTER — Emergency Department (HOSPITAL_COMMUNITY): Payer: Self-pay

## 2021-03-03 ENCOUNTER — Other Ambulatory Visit: Payer: Self-pay

## 2021-03-03 DIAGNOSIS — Z7951 Long term (current) use of inhaled steroids: Secondary | ICD-10-CM | POA: Insufficient documentation

## 2021-03-03 DIAGNOSIS — J4541 Moderate persistent asthma with (acute) exacerbation: Secondary | ICD-10-CM | POA: Insufficient documentation

## 2021-03-03 DIAGNOSIS — Z87891 Personal history of nicotine dependence: Secondary | ICD-10-CM | POA: Insufficient documentation

## 2021-03-03 LAB — CBC WITH DIFFERENTIAL/PLATELET
Abs Immature Granulocytes: 0.2 10*3/uL — ABNORMAL HIGH (ref 0.00–0.07)
Basophils Absolute: 0.1 10*3/uL (ref 0.0–0.1)
Basophils Relative: 0 %
Eosinophils Absolute: 1.4 10*3/uL — ABNORMAL HIGH (ref 0.0–0.5)
Eosinophils Relative: 8 %
HCT: 43.5 % (ref 36.0–46.0)
Hemoglobin: 14.4 g/dL (ref 12.0–15.0)
Immature Granulocytes: 1 %
Lymphocytes Relative: 22 %
Lymphs Abs: 4.1 10*3/uL — ABNORMAL HIGH (ref 0.7–4.0)
MCH: 28.7 pg (ref 26.0–34.0)
MCHC: 33.1 g/dL (ref 30.0–36.0)
MCV: 86.8 fL (ref 80.0–100.0)
Monocytes Absolute: 1.3 10*3/uL — ABNORMAL HIGH (ref 0.1–1.0)
Monocytes Relative: 7 %
Neutro Abs: 11 10*3/uL — ABNORMAL HIGH (ref 1.7–7.7)
Neutrophils Relative %: 62 %
Platelets: 425 10*3/uL — ABNORMAL HIGH (ref 150–400)
RBC: 5.01 MIL/uL (ref 3.87–5.11)
RDW: 15.1 % (ref 11.5–15.5)
WBC: 18.1 10*3/uL — ABNORMAL HIGH (ref 4.0–10.5)
nRBC: 0 % (ref 0.0–0.2)

## 2021-03-03 LAB — BASIC METABOLIC PANEL
Anion gap: 9 (ref 5–15)
BUN: 11 mg/dL (ref 6–20)
CO2: 26 mmol/L (ref 22–32)
Calcium: 8.9 mg/dL (ref 8.9–10.3)
Chloride: 102 mmol/L (ref 98–111)
Creatinine, Ser: 0.8 mg/dL (ref 0.44–1.00)
GFR, Estimated: >60 mL/min (ref >60)
Glucose, Bld: 85 mg/dL (ref 70–99)
Potassium: 3.6 mmol/L (ref 3.5–5.1)
Sodium: 137 mmol/L (ref 135–145)

## 2021-03-03 LAB — I-STAT BETA HCG BLOOD, ED (MC, WL, AP ONLY): I-stat hCG, quantitative: <5 m[IU]/mL (ref <5)

## 2021-03-03 MED ORDER — IPRATROPIUM-ALBUTEROL 0.5-2.5 (3) MG/3ML IN SOLN
3.0000 mL | Freq: Once | RESPIRATORY_TRACT | Status: AC
  Administered 2021-03-04: 3 mL via RESPIRATORY_TRACT
  Filled 2021-03-03: qty 3

## 2021-03-03 MED ORDER — IPRATROPIUM BROMIDE 0.02 % IN SOLN
0.5000 mg | Freq: Once | RESPIRATORY_TRACT | Status: AC
  Administered 2021-03-04: 0.5 mg via RESPIRATORY_TRACT
  Filled 2021-03-03: qty 2.5

## 2021-03-03 MED ORDER — ALBUTEROL (5 MG/ML) CONTINUOUS INHALATION SOLN: 10.0000 mg/h | INHALATION_SOLUTION | Freq: Once | RESPIRATORY_TRACT | Status: DC

## 2021-03-03 MED ORDER — MAGNESIUM SULFATE 50 % IJ SOLN: 2.0000 g | Freq: Once | INTRAMUSCULAR | Status: DC

## 2021-03-03 MED ORDER — MAGNESIUM SULFATE 2 GM/50ML IV SOLN
2.0000 g | Freq: Once | INTRAVENOUS | Status: AC
  Administered 2021-03-04: 2 g via INTRAVENOUS
  Filled 2021-03-03: qty 50

## 2021-03-03 MED ORDER — METHYLPREDNISOLONE SODIUM SUCC 125 MG IJ SOLR
125.0000 mg | Freq: Once | INTRAMUSCULAR | Status: AC
  Administered 2021-03-04: 125 mg via INTRAVENOUS
  Filled 2021-03-03: qty 2

## 2021-03-03 NOTE — ED Triage Notes (Signed)
Pt just finished up a 5 day round of steroids for worsening asthma. Pt was also given a prescription for teslan pearls. Pt states that she felt like she was getting better but last night it started back with congestion and cough that was unrelieved by her inhaler or cough meds.

## 2021-03-03 NOTE — ED Provider Notes (Signed)
[facility]-EMERGENCY DEPT Provider Note   CSN: [id number] Arrival date & time: 03/03/21  1631     History Chief Complaint  Patient presents with   Asthma    Wendy Morgan is a 45 y.o. female.  Patient presents to the emergency department for evaluation of difficulty breathing.  Patient reports that she has a history of asthma.  She has been having trouble for weeks.  Patient reports that she has been using her inhaler and has been on courses of prednisone.  She is currently off prednisone and symptoms are worsening.  She reports that she gets better with treatment and then wheezing comes back.  She has had persistent nonproductive cough.  No fever.      Past Medical History:  Diagnosis Date   Asthma    Bronchiectasis with acute exacerbation [facility])     Patient Active Problem List   Diagnosis Date Noted   Acute recurrent maxillary sinusitis 07/25/2019   Allergic rhinitis with postnasal drip 07/22/2019   Acute recurrent pansinusitis 07/22/2019   Dental caries 07/22/2019   Asthma exacerbation 05/29/2018    Past Surgical History:  Procedure Laterality Date   TUBAL LIGATION       OB History   No obstetric history on file.     Family History  Problem Relation Age of Onset   Healthy Mother    Healthy Father    Kidney disease Neg Hx    Anxiety disorder Neg Hx    Depression Neg Hx     Social History   Tobacco Use   Smoking status: Former    Types: Cigarettes    Quit date: 1998    Years since quitting: 24.8   Smokeless tobacco: Never  Vaping Use   Vaping Use: Never used  Substance Use Topics   Alcohol use: No   Drug use: No    Home Medications Prior to Admission medications   Medication Sig Start Date End Date Taking? Authorizing Provider  albuterol (PROVENTIL) (2.5 MG/3ML) 0.083% nebulizer solution Take 3 mLs (2.5 mg total) by nebulization every 6 (six) hours as needed for wheezing or shortness of breath. 02/22/21  Yes Henderly,  Britni A, PA-C  benzonatate (TESSALON) 100 MG capsule Take 1 capsule (100 mg total) by mouth every 8 (eight) hours. 02/22/21  Yes Henderly, Britni A, PA-C  doxycycline (VIBRAMYCIN) 100 MG capsule Take 1 capsule (100 mg total) by mouth 2 (two) times daily. 03/04/21  Yes Nimsi Males, Canary Brim, MD  mometasone-formoterol (DULERA) 200-5 MCG/ACT AERO INHALE 2 PUFFS INTO THE LUNGS 2 (TWO) TIMES DAILY. 04/19/20 04/19/21 Yes Standley Brooking, MD  pantoprazole (PROTONIX) 40 MG tablet TAKE 1 TABLET (40 MG TOTAL) BY MOUTH DAILY. Patient taking differently: Take 40 mg by mouth daily as needed. 05/03/20 05/03/21 Yes Claiborne Rigg, NP  predniSONE (DELTASONE) 10 MG tablet Take 2 tablets (20 mg total) by mouth daily. 03/04/21  Yes Kasi Lasky, Canary Brim, MD  umeclidinium bromide (INCRUSE ELLIPTA) 62.5 MCG/INH AEPB Inhale 1 puff into the lungs daily. 01/22/21 03/23/21 Yes Badalamente, Rose Phi, PA-C  cetirizine (ZYRTEC) 10 MG tablet TAKE 1 TABLET (10 MG TOTAL) BY MOUTH DAILY. Patient not taking: Reported on 03/04/2021 05/03/20 05/03/21  Claiborne Rigg, NP  fluticasone (FLONASE) 50 MCG/ACT nasal spray PLACE 2 SPRAYS INTO BOTH NOSTRILS IN THE MORNING AND AT BEDTIME. Patient not taking: Reported on 03/04/2021 05/03/20 05/03/21  Claiborne Rigg, NP  montelukast (SINGULAIR) 10 MG tablet TAKE 1 TABLET (10 MG TOTAL) BY  MOUTH AT BEDTIME. Patient not taking: Reported on 03/04/2021 05/03/20 05/03/21  Claiborne Rigg, NP    Allergies    Patient has no known allergies.  Review of Systems   Review of Systems  Respiratory:  Positive for cough, shortness of breath and wheezing.   All other systems reviewed and are negative.  Physical Exam Updated Vital Signs BP 129/71   Pulse 74   Temp 98 F (36.7 C) (Oral)   Resp 18   Ht 5\' 4"  (1.626 m)   Wt 100.7 kg   SpO2 100%   BMI 38.11 kg/m   Physical Exam Vitals and nursing note reviewed.  Constitutional:      General: She is not in acute distress.    Appearance: Normal  appearance. She is well-developed.  HENT:     Head: Normocephalic and atraumatic.     Right Ear: Hearing normal.     Left Ear: Hearing normal.     Nose: Nose normal.  Eyes:     Conjunctiva/sclera: Conjunctivae normal.     Pupils: Pupils are equal, round, and reactive to light.  Cardiovascular:     Rate and Rhythm: Regular rhythm.     Heart sounds: S1 normal and S2 normal. No murmur heard.   No friction rub. No gallop.  Pulmonary:     Effort: Accessory muscle usage and prolonged expiration present. No respiratory distress.     Breath sounds: Decreased air movement present. Decreased breath sounds and wheezing present.  Chest:     Chest wall: No tenderness.  Abdominal:     General: Bowel sounds are normal.     Palpations: Abdomen is soft.     Tenderness: There is no abdominal tenderness. There is no guarding or rebound. Negative signs include Murphy's sign and McBurney's sign.     Hernia: No hernia is present.  Musculoskeletal:        General: Normal range of motion.     Cervical back: Normal range of motion and neck supple.  Skin:    General: Skin is warm and dry.     Findings: No rash.  Neurological:     Mental Status: She is alert and oriented to person, place, and time.     GCS: GCS eye subscore is 4. GCS verbal subscore is 5. GCS motor subscore is 6.     Cranial Nerves: No cranial nerve deficit.     Sensory: No sensory deficit.     Coordination: Coordination normal.  Psychiatric:        Speech: Speech normal.        Behavior: Behavior normal.        Thought Content: Thought content normal.    ED Results / Procedures / Treatments   Labs (all labs ordered are listed, but only abnormal results are displayed) Labs Reviewed  CBC WITH DIFFERENTIAL/PLATELET - Abnormal; Notable for the following components:      Result Value   WBC 18.1 (*)    Platelets 425 (*)    Neutro Abs 11.0 (*)    Lymphs Abs 4.1 (*)    Monocytes Absolute 1.3 (*)    Eosinophils Absolute 1.4 (*)     Abs Immature Granulocytes 0.20 (*)    All other components within normal limits  BASIC METABOLIC PANEL  I-STAT BETA HCG BLOOD, ED ([facility], [facility], [facility] ONLY)    EKG EKG Interpretation  Date/Time:  Friday March 04 2021 00:28:01 EST Ventricular Rate:  68 PR Interval:  173 QRS Duration: 96 QT  Interval:  405 QTC Calculation: 431 R Axis:   62 Text Interpretation: Sinus rhythm Ventricular premature complex Confirmed by Gilda Crease [phone]) on 03/04/2021 12:41:51 AM  Radiology DG Chest 2 View  Result Date: 03/03/2021 CLINICAL DATA:  Shortness of breath. EXAM: CHEST - 2 VIEW COMPARISON:  02/22/2021 FINDINGS: The cardiomediastinal contours are normal. Minimal central bronchial thickening. Pulmonary vasculature is normal. No consolidation, pleural effusion, or pneumothorax. No acute osseous abnormalities are seen. IMPRESSION: Minimal central bronchial thickening can be seen with bronchitis or asthma. Electronically Signed   By: Narda Rutherford M.D.   On: 03/03/2021 18:30    Procedures Procedures   Medications Ordered in ED Medications  ipratropium-albuterol (DUONEB) 0.5-2.5 (3) MG/3ML nebulizer solution 3 mL (3 mLs Nebulization Given 03/04/21 0058)  methylPREDNISolone sodium succinate (SOLU-MEDROL) 125 mg/2 mL injection 125 mg (125 mg Intravenous Given 03/04/21 0033)  magnesium sulfate IVPB 2 g 50 mL (0 g Intravenous Stopped 03/04/21 0236)  ipratropium (ATROVENT) nebulizer solution 0.5 mg (0.5 mg Nebulization Given 03/04/21 0037)  albuterol (PROVENTIL) (2.5 MG/3ML) 0.083% nebulizer solution 10 mg (10 mg Nebulization Given 03/04/21 0037)    ED Course  I have reviewed the triage vital signs and the nursing notes.  Pertinent labs & imaging results that were available during my care of the patient were reviewed by me and considered in my medical decision making (see chart for details).    MDM Rules/Calculators/A&P                           Patient presents to the emergency  department for evaluation of shortness of breath.  Patient has a history of asthma.  She has been having increased asthma symptoms for several weeks.  She was on a course of prednisone.  She reports that she improved but is now worsening again.  Patient with active bronchospasm but no hypoxia at arrival.  She has significantly improved with treatment here in the emergency department.  She does not require hospitalization at this time.  Final Clinical Impression(s) / ED Diagnoses Final diagnoses:  Moderate persistent asthma with exacerbation    Rx / DC Orders ED Discharge Orders          Ordered    doxycycline (VIBRAMYCIN) 100 MG capsule  2 times daily        03/04/21 0303    predniSONE (DELTASONE) 10 MG tablet  Daily        03/04/21 0303             Gilda Crease, MD 03/04/21 [phone]

## 2021-03-03 NOTE — ED Provider Notes (Signed)
Emergency Medicine Provider Triage Evaluation Note  Wendy Morgan , a 45 y.o. female  was evaluated in triage.  Pt complains of shortness of breath onset last night.  She notes she just finished a 5-day round of steroids for worsening asthma.  She reports that the steroids helped initially however her breathing worsened last night.  Patient was also given prescription for [facility].  Patient reports she felt she was getting better, but last night started back with nasal congestion, cough, chest pain (due to cough).  She tried her inhaler and Tessalon Perles without relief of her symptoms.  Patient denies chest pain, fever, or chills.  Patient denies sick contacts.   Review of Systems  Positive: Shortness of breath, cough Negative: Fever, chills  Physical Exam  BP (!) 114/92 (BP Location: Left Arm)   Pulse 81   Temp 98 F (36.7 C) (Oral)   Resp 17   SpO2 96%  Gen:   Awake, no distress   Resp:  Normal effort, wheezing noted throughout lung fields. MSK:   Moves extremities without difficulty   Medical Decision Making  Medically screening exam initiated at 5:04 PM.  Appropriate orders placed.  Doreene Burke COLBI SCHILTZ was informed that the remainder of the evaluation will be completed by another provider, this initial triage assessment does not replace that evaluation, and the importance of remaining in the ED until their evaluation is complete.    Aleece Loyd A, PA-C 03/03/21 1714    Milagros Loll, MD 03/03/21 1740

## 2021-03-04 ENCOUNTER — Other Ambulatory Visit: Payer: Self-pay

## 2021-03-04 MED ORDER — ALBUTEROL SULFATE (2.5 MG/3ML) 0.083% IN NEBU: 10.0000 mg | INHALATION_SOLUTION | Freq: Once | RESPIRATORY_TRACT | Status: AC

## 2021-03-04 MED ORDER — ALBUTEROL SULFATE (2.5 MG/3ML) 0.083% IN NEBU
INHALATION_SOLUTION | RESPIRATORY_TRACT | Status: AC
  Administered 2021-03-04: 10 mg via RESPIRATORY_TRACT
  Filled 2021-03-04: qty 12

## 2021-03-04 MED ORDER — PREDNISONE 10 MG PO TABS
20.0000 mg | ORAL_TABLET | Freq: Every day | ORAL | 0 refills | Status: DC
  Filled 2021-03-04: qty 10, 5d supply, fill #0

## 2021-03-04 MED ORDER — DOXYCYCLINE HYCLATE 100 MG PO TABS
100.0000 mg | ORAL_TABLET | Freq: Two times a day (BID) | ORAL | 0 refills | Status: DC
  Filled 2021-03-04: qty 14, 7d supply, fill #0

## 2021-03-13 ENCOUNTER — Emergency Department (HOSPITAL_COMMUNITY)
Admission: EM | Admit: 2021-03-13 | Discharge: 2021-03-13 | Disposition: A | Discharge status: Home / Self Care | Payer: Self-pay | Attending: Emergency Medicine | Admitting: Emergency Medicine

## 2021-03-13 ENCOUNTER — Encounter (HOSPITAL_COMMUNITY): Payer: Self-pay | Admitting: Emergency Medicine

## 2021-03-13 ENCOUNTER — Other Ambulatory Visit: Payer: Self-pay

## 2021-03-13 ENCOUNTER — Emergency Department (HOSPITAL_COMMUNITY): Payer: Self-pay

## 2021-03-13 DIAGNOSIS — J4521 Mild intermittent asthma with (acute) exacerbation: Secondary | ICD-10-CM | POA: Insufficient documentation

## 2021-03-13 DIAGNOSIS — Z7951 Long term (current) use of inhaled steroids: Secondary | ICD-10-CM | POA: Insufficient documentation

## 2021-03-13 DIAGNOSIS — R0981 Nasal congestion: Secondary | ICD-10-CM

## 2021-03-13 DIAGNOSIS — Z20822 Contact with and (suspected) exposure to covid-19: Secondary | ICD-10-CM | POA: Insufficient documentation

## 2021-03-13 DIAGNOSIS — Z87891 Personal history of nicotine dependence: Secondary | ICD-10-CM | POA: Insufficient documentation

## 2021-03-13 DIAGNOSIS — N9489 Other specified conditions associated with female genital organs and menstrual cycle: Secondary | ICD-10-CM | POA: Insufficient documentation

## 2021-03-13 LAB — CBC WITH DIFFERENTIAL/PLATELET
Abs Immature Granulocytes: 0.06 10*3/uL (ref 0.00–0.07)
Basophils Absolute: 0.1 10*3/uL (ref 0.0–0.1)
Basophils Relative: 1 %
Eosinophils Absolute: 1.6 10*3/uL — ABNORMAL HIGH (ref 0.0–0.5)
Eosinophils Relative: 11 %
HCT: 42.3 % (ref 36.0–46.0)
Hemoglobin: 13.9 g/dL (ref 12.0–15.0)
Immature Granulocytes: 0 %
Lymphocytes Relative: 20 %
Lymphs Abs: 2.9 10*3/uL (ref 0.7–4.0)
MCH: 29 pg (ref 26.0–34.0)
MCHC: 32.9 g/dL (ref 30.0–36.0)
MCV: 88.1 fL (ref 80.0–100.0)
Monocytes Absolute: 1.3 10*3/uL — ABNORMAL HIGH (ref 0.1–1.0)
Monocytes Relative: 9 %
Neutro Abs: 8.6 10*3/uL — ABNORMAL HIGH (ref 1.7–7.7)
Neutrophils Relative %: 59 %
Platelets: 329 10*3/uL (ref 150–400)
RBC: 4.8 MIL/uL (ref 3.87–5.11)
RDW: 15.5 % (ref 11.5–15.5)
WBC: 14.5 10*3/uL — ABNORMAL HIGH (ref 4.0–10.5)
nRBC: 0 % (ref 0.0–0.2)

## 2021-03-13 LAB — BASIC METABOLIC PANEL
Anion gap: 5 (ref 5–15)
BUN: 11 mg/dL (ref 6–20)
CO2: 25 mmol/L (ref 22–32)
Calcium: 8.7 mg/dL — ABNORMAL LOW (ref 8.9–10.3)
Chloride: 109 mmol/L (ref 98–111)
Creatinine, Ser: 0.73 mg/dL (ref 0.44–1.00)
GFR, Estimated: >60 mL/min (ref >60)
Glucose, Bld: 99 mg/dL (ref 70–99)
Potassium: 3.5 mmol/L (ref 3.5–5.1)
Sodium: 139 mmol/L (ref 135–145)

## 2021-03-13 LAB — RESP PANEL BY RT-PCR (FLU A&B, COVID) ARPGX2
Influenza A by PCR: NEGATIVE
Influenza B by PCR: NEGATIVE
SARS Coronavirus 2 by RT PCR: NEGATIVE

## 2021-03-13 LAB — I-STAT BETA HCG BLOOD, ED (MC, WL, AP ONLY): I-stat hCG, quantitative: <5 m[IU]/mL (ref <5)

## 2021-03-13 MED ORDER — MAGNESIUM SULFATE 2 GM/50ML IV SOLN
2.0000 g | Freq: Once | INTRAVENOUS | Status: AC
  Administered 2021-03-13: 12:00:00 2 g via INTRAVENOUS
  Filled 2021-03-13: qty 50

## 2021-03-13 MED ORDER — DEXAMETHASONE 1 MG/ML PO CONC: 10.0000 mg | Freq: Once | ORAL | Status: DC

## 2021-03-13 MED ORDER — FLUTICASONE PROPIONATE 50 MCG/ACT NA SUSP: 1.0000 | Freq: Every day | NASAL | 0 refills | Status: DC

## 2021-03-13 MED ORDER — PREDNISONE 10 MG (21) PO TBPK: ORAL_TABLET | Freq: Every day | ORAL | 0 refills | Status: DC

## 2021-03-13 MED ORDER — IPRATROPIUM-ALBUTEROL 0.5-2.5 (3) MG/3ML IN SOLN
3.0000 mL | RESPIRATORY_TRACT | Status: AC
  Administered 2021-03-13 (×2): 3 mL via RESPIRATORY_TRACT
  Filled 2021-03-13: qty 6

## 2021-03-13 MED ORDER — DEXAMETHASONE 4 MG PO TABS
10.0000 mg | ORAL_TABLET | Freq: Once | ORAL | Status: AC
  Administered 2021-03-13: 12:00:00 10 mg via ORAL
  Filled 2021-03-13: qty 2

## 2021-03-13 MED ORDER — CETIRIZINE HCL 10 MG PO TABS: 10.0000 mg | ORAL_TABLET | Freq: Every day | ORAL | 0 refills | Status: DC

## 2021-03-13 MED ORDER — IPRATROPIUM-ALBUTEROL 0.5-2.5 (3) MG/3ML IN SOLN
3.0000 mL | RESPIRATORY_TRACT | Status: AC
  Administered 2021-03-13 (×2): 3 mL via RESPIRATORY_TRACT
  Filled 2021-03-13 (×2): qty 3
  Filled 2021-03-13: qty 6

## 2021-03-13 MED ORDER — ALBUTEROL SULFATE HFA 108 (90 BASE) MCG/ACT IN AERS: 1.0000 | INHALATION_SPRAY | Freq: Four times a day (QID) | RESPIRATORY_TRACT | 0 refills | Status: DC | PRN

## 2021-03-13 NOTE — ED Provider Notes (Signed)
[facility]-EMERGENCY DEPT Provider Note   CSN: [id number] Arrival date & time: 03/13/21  1131     History Chief Complaint  Patient presents with   Asthma    Kathalene UNIKA NAZARENO is a 45 y.o. female.  This is a 45 y.o. female with significant medical history as below, including asthma who presents to the ED with complaint of dib. Hx asthma, was started on oral abx, mild improvement initially with abx but then symptoms worsened. Worse in last 24 hours. No sig improvement with home inhaler. No hx home o2 or intubation. No fevers or chills, no n/v, no change to bowel/bladder fxn. Similar to prior asthma exacerbations in the past. Dib worsened with exertion, improved w/ rest.   The history is provided by the patient. No language interpreter was used.  Asthma This is a recurrent problem. The problem has been gradually worsening. Associated symptoms include shortness of breath. Pertinent negatives include no chest pain, no abdominal pain and no headaches. The symptoms are aggravated by coughing. The symptoms are relieved by medications.      Past Medical History:  Diagnosis Date   Asthma    Bronchiectasis with acute exacerbation [facility])     Patient Active Problem List   Diagnosis Date Noted   Acute recurrent maxillary sinusitis 07/25/2019   Allergic rhinitis with postnasal drip 07/22/2019   Acute recurrent pansinusitis 07/22/2019   Dental caries 07/22/2019   Asthma exacerbation 05/29/2018    Past Surgical History:  Procedure Laterality Date   TUBAL LIGATION       OB History   No obstetric history on file.     Family History  Problem Relation Age of Onset   Healthy Mother    Healthy Father    Kidney disease Neg Hx    Anxiety disorder Neg Hx    Depression Neg Hx     Social History   Tobacco Use   Smoking status: Former    Types: Cigarettes    Quit date: 1998    Years since quitting: 24.9   Smokeless tobacco: Never  Vaping Use   Vaping Use:  Never used  Substance Use Topics   Alcohol use: No   Drug use: No    Home Medications Prior to Admission medications   Medication Sig Start Date End Date Taking? Authorizing Provider  albuterol (VENTOLIN HFA) 108 (90 Base) MCG/ACT inhaler Inhale 1-2 puffs into the lungs every 6 (six) hours as needed for wheezing or shortness of breath. 03/13/21  Yes Sloan Leiter, DO  cetirizine (ZYRTEC ALLERGY) 10 MG tablet Take 1 tablet (10 mg total) by mouth daily for 14 days. 03/13/21 03/27/21 Yes Tanda Rockers A, DO  fluticasone (FLONASE) 50 MCG/ACT nasal spray Place 1 spray into both nostrils daily for 14 days. 03/13/21 03/27/21 Yes Sloan Leiter, DO  predniSONE (STERAPRED UNI-PAK 21 TAB) 10 MG (21) TBPK tablet Take by mouth daily. Take 6 tabs by mouth daily  for 2 days, then 5 tabs for 2 days, then 4 tabs for 2 days, then 3 tabs for 2 days, 2 tabs for 2 days, then 1 tab by mouth daily for 2 days 03/13/21  Yes Tanda Rockers A, DO  albuterol (PROVENTIL) (2.5 MG/3ML) 0.083% nebulizer solution Take 3 mLs (2.5 mg total) by nebulization every 6 (six) hours as needed for wheezing or shortness of breath. 02/22/21   Henderly, Britni A, PA-C  benzonatate (TESSALON) 100 MG capsule Take 1 capsule (100 mg total) by mouth every 8 (  eight) hours. 02/22/21   Henderly, Britni A, PA-C  cetirizine (ZYRTEC) 10 MG tablet TAKE 1 TABLET (10 MG TOTAL) BY MOUTH DAILY. Patient not taking: Reported on 03/04/2021 05/03/20 05/03/21  Claiborne Rigg, NP  doxycycline (VIBRA-TABS) 100 MG tablet Take 1 tablet (100 mg total) by mouth 2 (two) times daily. 03/04/21   Gilda Crease, MD  fluticasone (FLONASE) 50 MCG/ACT nasal spray PLACE 2 SPRAYS INTO BOTH NOSTRILS IN THE MORNING AND AT BEDTIME. Patient not taking: Reported on 03/04/2021 05/03/20 05/03/21  Claiborne Rigg, NP  mometasone-formoterol (DULERA) 200-5 MCG/ACT AERO INHALE 2 PUFFS INTO THE LUNGS 2 (TWO) TIMES DAILY. 04/19/20 04/19/21  Standley Brooking, MD  montelukast (SINGULAIR)  10 MG tablet TAKE 1 TABLET (10 MG TOTAL) BY MOUTH AT BEDTIME. Patient not taking: Reported on 03/04/2021 05/03/20 05/03/21  Claiborne Rigg, NP  pantoprazole (PROTONIX) 40 MG tablet TAKE 1 TABLET (40 MG TOTAL) BY MOUTH DAILY. Patient taking differently: Take 40 mg by mouth daily as needed. 05/03/20 05/03/21  Claiborne Rigg, NP  predniSONE (DELTASONE) 10 MG tablet Take 2 tablets (20 mg total) by mouth daily. 03/04/21   Gilda Crease, MD  umeclidinium bromide (INCRUSE ELLIPTA) 62.5 MCG/INH AEPB Inhale 1 puff into the lungs daily. 01/22/21 03/23/21  Haskel Schroeder, PA-C    Allergies    Patient has no known allergies.  Review of Systems   Review of Systems  Constitutional:  Negative for activity change and fever.  HENT:  Negative for facial swelling and trouble swallowing.   Eyes:  Negative for discharge and redness.  Respiratory:  Positive for cough, shortness of breath and wheezing.   Cardiovascular:  Negative for chest pain and palpitations.  Gastrointestinal:  Negative for abdominal pain and nausea.  Genitourinary:  Negative for dysuria and flank pain.  Musculoskeletal:  Negative for back pain and gait problem.  Skin:  Negative for pallor and rash.  Neurological:  Negative for syncope and headaches.   Physical Exam Updated Vital Signs BP 128/70   Pulse 90   Temp 97.9 F (36.6 C) (Oral)   Resp 13   SpO2 97%   Physical Exam Vitals and nursing note reviewed.  Constitutional:      General: She is not in acute distress.    Appearance: Normal appearance.  HENT:     Head: Normocephalic and atraumatic.     Right Ear: External ear normal.     Left Ear: External ear normal.     Nose: Nose normal.     Mouth/Throat:     Mouth: Mucous membranes are moist.  Eyes:     General: No scleral icterus.       Right eye: No discharge.        Left eye: No discharge.  Cardiovascular:     Rate and Rhythm: Normal rate and regular rhythm.     Pulses: Normal pulses.     Heart  sounds: Normal heart sounds.  Pulmonary:     Effort: Tachypnea present. No respiratory distress.     Breath sounds: Decreased air movement present. Decreased breath sounds and wheezing present.  Abdominal:     General: Abdomen is flat.     Tenderness: There is no abdominal tenderness.  Musculoskeletal:        General: Normal range of motion.     Cervical back: Normal range of motion.     Right lower leg: No edema.     Left lower leg: No edema.  Skin:  General: Skin is warm and dry.     Capillary Refill: Capillary refill takes less than 2 seconds.  Neurological:     Mental Status: She is alert.  Psychiatric:        Mood and Affect: Mood normal.        Behavior: Behavior normal.    ED Results / Procedures / Treatments   Labs (all labs ordered are listed, but only abnormal results are displayed) Labs Reviewed  BASIC METABOLIC PANEL - Abnormal; Notable for the following components:      Result Value   Calcium 8.7 (*)    All other components within normal limits  CBC WITH DIFFERENTIAL/PLATELET - Abnormal; Notable for the following components:   WBC 14.5 (*)    Neutro Abs 8.6 (*)    Monocytes Absolute 1.3 (*)    Eosinophils Absolute 1.6 (*)    All other components within normal limits  RESP PANEL BY RT-PCR (FLU A&B, COVID) [id number]  I-STAT BETA HCG BLOOD, ED ([facility], [facility], [facility] ONLY)    EKG None  Radiology DG Chest Port 1 View  Result Date: 03/13/2021 CLINICAL DATA:  Pt reports worsening asthma with severe. Pt states she has been using home inhaler and antibiotics w/ no relief. Pt was usuing nebulizer when imaging was performed. Hx of asthma, former smoker EXAM: PORTABLE CHEST 1 VIEW COMPARISON:  03/03/2021 FINDINGS: Cardiac silhouette is normal in size. Normal mediastinal and hilar contours. Clear lungs.  No pleural effusion or pneumothorax. Skeletal structures are grossly intact. IMPRESSION: No active disease. Electronically Signed   By: Amie Portland M.D.   On: 03/13/2021 13:06     Procedures Procedures   Medications Ordered in ED Medications  magnesium sulfate IVPB 2 g 50 mL (0 g Intravenous Stopped 03/13/21 1223)  ipratropium-albuterol (DUONEB) 0.5-2.5 (3) MG/3ML nebulizer solution 3 mL (3 mLs Nebulization Given 03/13/21 1220)  dexamethasone (DECADRON) tablet 10 mg (10 mg Oral Given 03/13/21 1209)  ipratropium-albuterol (DUONEB) 0.5-2.5 (3) MG/3ML nebulizer solution 3 mL (3 mLs Nebulization Given 03/13/21 1350)    ED Course  I have reviewed the triage vital signs and the nursing notes.  Pertinent labs & imaging results that were available during my care of the patient were reviewed by me and considered in my medical decision making (see chart for details).    MDM Rules/Calculators/A&P                          CC: dib  This patient complains of dib; this involves an extensive number of treatment options and is a complaint that carries with it a high risk of complications and morbidity. Vital signs were reviewed. Serious etiologies considered.  Record review:  Previous records obtained and reviewed   Work up as above, notable for:  Labs & imaging results that were available during my care of the patient were reviewed by me and considered in my medical decision making.   I ordered imaging studies which included cxr and I independently visualized and interpreted imaging which showed stable  Management: Give mgso4, neb breathing treatment, steroids  Reassessment:  Patient reports overall that she is feeling much better.  She has some nasal congestion postnasal drip which could be contributing to her respiratory status.  She was given refill for her albuterol inhaler, steroid taper.  Antihistamine and nasal spray.  Advised to avoid known triggers.  Follow-up with PCP regarding asthma  The patient improved significantly and was discharged in stable condition.  Detailed discussions were had with the patient regarding current findings, and need for close f/u  with PCP or on call doctor. The patient has been instructed to return immediately if the symptoms worsen in any way for re-evaluation. Patient verbalized understanding and is in agreement with current care plan. All questions answered prior to discharge.           This chart was dictated using voice recognition software.  Despite best efforts to proofread,  errors can occur which can change the documentation meaning.  Final Clinical Impression(s) / ED Diagnoses Final diagnoses:  Mild intermittent asthma with exacerbation  Sinus congestion    Rx / DC Orders ED Discharge Orders          Ordered    predniSONE (STERAPRED UNI-PAK 21 TAB) 10 MG (21) TBPK tablet  Daily        03/13/21 1510    cetirizine (ZYRTEC ALLERGY) 10 MG tablet  Daily        03/13/21 1510    fluticasone (FLONASE) 50 MCG/ACT nasal spray  Daily        03/13/21 1510    albuterol (VENTOLIN HFA) 108 (90 Base) MCG/ACT inhaler  Every 6 hours PRN        03/13/21 1510             Sloan Leiter, DO 03/13/21 1553

## 2021-03-13 NOTE — ED Triage Notes (Signed)
Pt reports worsening asthma. Pt states she has been using home inhaler and antibiotics w/ no relief. Wheezing noted.

## 2021-03-13 NOTE — ED Notes (Signed)
Pt states understanding of dc instructions, importance of follow up, and prescriptions. Pt denies questions or concerns upon dc. Pt declined wheelchair assistance upon dc. Pt ambulated out of ed w/ steady gait. No belongings left in room upon dc.  

## 2021-03-28 ENCOUNTER — Other Ambulatory Visit: Payer: Self-pay

## 2021-03-29 ENCOUNTER — Other Ambulatory Visit: Payer: Self-pay

## 2021-03-29 ENCOUNTER — Encounter (HOSPITAL_COMMUNITY): Payer: Self-pay

## 2021-03-29 ENCOUNTER — Emergency Department (HOSPITAL_COMMUNITY): Payer: Self-pay

## 2021-03-29 ENCOUNTER — Emergency Department (HOSPITAL_COMMUNITY)
Admission: EM | Admit: 2021-03-29 | Discharge: 2021-03-29 | Disposition: A | Discharge status: Home / Self Care | Payer: Self-pay | Attending: Emergency Medicine | Admitting: Emergency Medicine

## 2021-03-29 DIAGNOSIS — Z87891 Personal history of nicotine dependence: Secondary | ICD-10-CM | POA: Insufficient documentation

## 2021-03-29 DIAGNOSIS — J111 Influenza due to unidentified influenza virus with other respiratory manifestations: Secondary | ICD-10-CM

## 2021-03-29 DIAGNOSIS — J45901 Unspecified asthma with (acute) exacerbation: Secondary | ICD-10-CM | POA: Insufficient documentation

## 2021-03-29 DIAGNOSIS — Z20822 Contact with and (suspected) exposure to covid-19: Secondary | ICD-10-CM | POA: Insufficient documentation

## 2021-03-29 DIAGNOSIS — Z7951 Long term (current) use of inhaled steroids: Secondary | ICD-10-CM | POA: Insufficient documentation

## 2021-03-29 DIAGNOSIS — J101 Influenza due to other identified influenza virus with other respiratory manifestations: Secondary | ICD-10-CM | POA: Insufficient documentation

## 2021-03-29 LAB — RESP PANEL BY RT-PCR (FLU A&B, COVID) ARPGX2
Influenza A by PCR: POSITIVE — AB
Influenza B by PCR: NEGATIVE
SARS Coronavirus 2 by RT PCR: NEGATIVE

## 2021-03-29 MED ORDER — IPRATROPIUM-ALBUTEROL 0.5-2.5 (3) MG/3ML IN SOLN
3.0000 mL | Freq: Once | RESPIRATORY_TRACT | Status: AC
  Administered 2021-03-29: 3 mL via RESPIRATORY_TRACT
  Filled 2021-03-29: qty 3

## 2021-03-29 MED ORDER — ALBUTEROL SULFATE HFA 108 (90 BASE) MCG/ACT IN AERS
2.0000 | INHALATION_SPRAY | Freq: Once | RESPIRATORY_TRACT | Status: AC
  Administered 2021-03-29: 2 via RESPIRATORY_TRACT
  Filled 2021-03-29: qty 6.7

## 2021-03-29 MED ORDER — ALBUTEROL SULFATE (2.5 MG/3ML) 0.083% IN NEBU
2.5000 mg | INHALATION_SOLUTION | Freq: Once | RESPIRATORY_TRACT | Status: AC
  Administered 2021-03-29: 2.5 mg via RESPIRATORY_TRACT
  Filled 2021-03-29: qty 3

## 2021-03-29 MED ORDER — OSELTAMIVIR PHOSPHATE 75 MG PO CAPS
75.0000 mg | ORAL_CAPSULE | Freq: Two times a day (BID) | ORAL | 0 refills | Status: DC
  Filled 2021-03-29: qty 10, 5d supply, fill #0

## 2021-03-29 MED ORDER — PREDNISONE 20 MG PO TABS
60.0000 mg | ORAL_TABLET | Freq: Once | ORAL | Status: AC
  Administered 2021-03-29: 60 mg via ORAL
  Filled 2021-03-29: qty 3

## 2021-03-29 NOTE — ED Provider Notes (Signed)
[facility] [facility] DEPT Provider Note   CSN: [id number] Arrival date & time: 03/29/21  1246     History Chief Complaint  Patient presents with   Asthma   Chills   Nausea   Fatigue   Cough   Medication Refill    Wendy Morgan is a 45 y.o. female.  Patient complains of cough shortness of breath since yesterday.  Patient has a history of asthma  The history is provided by the patient and medical records. No language interpreter was used.  Cough Cough characteristics:  Non-productive Sputum characteristics:  Nondescript Severity:  Moderate Onset quality:  Sudden Timing:  Constant Progression:  Waxing and waning Associated symptoms: wheezing   Associated symptoms: no chest pain, no eye discharge, no headaches and no rash       Past Medical History:  Diagnosis Date   Asthma    Bronchiectasis with acute exacerbation [facility])     Patient Active Problem List   Diagnosis Date Noted   Acute recurrent maxillary sinusitis 07/25/2019   Allergic rhinitis with postnasal drip 07/22/2019   Acute recurrent pansinusitis 07/22/2019   Dental caries 07/22/2019   Asthma exacerbation 05/29/2018    Past Surgical History:  Procedure Laterality Date   TUBAL LIGATION       OB History   No obstetric history on file.     Family History  Problem Relation Age of Onset   Healthy Mother    Healthy Father    Kidney disease Neg Hx    Anxiety disorder Neg Hx    Depression Neg Hx     Social History   Tobacco Use   Smoking status: Former    Types: Cigarettes    Quit date: 1998    Years since quitting: 24.9   Smokeless tobacco: Never  Vaping Use   Vaping Use: Never used  Substance Use Topics   Alcohol use: No   Drug use: No    Home Medications Prior to Admission medications   Medication Sig Start Date End Date Taking? Authorizing Provider  oseltamivir (TAMIFLU) 75 MG capsule Take 1 capsule (75 mg total) by mouth every 12 (twelve) hours. 03/29/21   Yes Milton Ferguson, MD  albuterol (PROVENTIL) (2.5 MG/3ML) 0.083% nebulizer solution Take 3 mLs (2.5 mg total) by nebulization every 6 (six) hours as needed for wheezing or shortness of breath. 02/22/21   Henderly, Britni A, PA-C  albuterol (VENTOLIN HFA) 108 (90 Base) MCG/ACT inhaler Inhale 1-2 puffs into the lungs every 6 (six) hours as needed for wheezing or shortness of breath. 03/13/21   Jeanell Sparrow, DO  benzonatate (TESSALON) 100 MG capsule Take 1 capsule (100 mg total) by mouth every 8 (eight) hours. 02/22/21   Henderly, Britni A, PA-C  cetirizine (ZYRTEC ALLERGY) 10 MG tablet Take 1 tablet (10 mg total) by mouth daily for 14 days. 03/13/21 03/27/21  Jeanell Sparrow, DO  cetirizine (ZYRTEC) 10 MG tablet TAKE 1 TABLET (10 MG TOTAL) BY MOUTH DAILY. Patient not taking: Reported on 03/04/2021 05/03/20 05/03/21  Gildardo Pounds, NP  doxycycline (VIBRA-TABS) 100 MG tablet Take 1 tablet (100 mg total) by mouth 2 (two) times daily. 03/04/21   Orpah Greek, MD  fluticasone (FLONASE) 50 MCG/ACT nasal spray PLACE 2 SPRAYS INTO BOTH NOSTRILS IN THE MORNING AND AT BEDTIME. Patient not taking: Reported on 03/04/2021 05/03/20 05/03/21  Gildardo Pounds, NP  fluticasone [facility]) 50 MCG/ACT nasal spray Place 1 spray into both nostrils daily for 14 days.  03/13/21 03/27/21  Tanda Rockers A, DO  mometasone-formoterol (DULERA) 200-5 MCG/ACT AERO INHALE 2 PUFFS INTO THE LUNGS 2 (TWO) TIMES DAILY. 04/19/20 04/19/21  Standley Brooking, MD  montelukast (SINGULAIR) 10 MG tablet TAKE 1 TABLET (10 MG TOTAL) BY MOUTH AT BEDTIME. Patient not taking: Reported on 03/04/2021 05/03/20 05/03/21  Claiborne Rigg, NP  pantoprazole (PROTONIX) 40 MG tablet TAKE 1 TABLET (40 MG TOTAL) BY MOUTH DAILY. Patient taking differently: Take 40 mg by mouth daily as needed. 05/03/20 05/03/21  Claiborne Rigg, NP  predniSONE (DELTASONE) 10 MG tablet Take 2 tablets (20 mg total) by mouth daily. 03/04/21   Gilda Crease, MD   predniSONE (STERAPRED UNI-PAK 21 TAB) 10 MG (21) TBPK tablet Take by mouth daily. Take 6 tabs by mouth daily  for 2 days, then 5 tabs for 2 days, then 4 tabs for 2 days, then 3 tabs for 2 days, 2 tabs for 2 days, then 1 tab by mouth daily for 2 days 03/13/21   Sloan Leiter, DO    Allergies    Patient has no known allergies.  Review of Systems   Review of Systems  Constitutional:  Negative for appetite change and fatigue.  HENT:  Negative for congestion, ear discharge and sinus pressure.   Eyes:  Negative for discharge.  Respiratory:  Positive for cough and wheezing.   Cardiovascular:  Negative for chest pain.  Gastrointestinal:  Negative for abdominal pain and diarrhea.  Genitourinary:  Negative for frequency and hematuria.  Musculoskeletal:  Negative for back pain.  Skin:  Negative for rash.  Neurological:  Negative for seizures and headaches.  Psychiatric/Behavioral:  Negative for hallucinations.    Physical Exam Updated Vital Signs BP 137/81 (BP Location: Left Arm)    Pulse 90    Temp 98.8 F (37.1 C) (Oral)    Resp 18    Ht 5\' 4"  (1.626 m)    Wt 90.7 kg    SpO2 96%    BMI 34.33 kg/m   Physical Exam Vitals and nursing note reviewed.  Constitutional:      Appearance: She is well-developed.  HENT:     Head: Normocephalic.  Eyes:     General: No scleral icterus.    Conjunctiva/sclera: Conjunctivae normal.  Neck:     Thyroid: No thyromegaly.  Cardiovascular:     Rate and Rhythm: Normal rate and regular rhythm.     Heart sounds: No murmur heard.   No friction rub. No gallop.  Pulmonary:     Breath sounds: No stridor. Wheezing present. No rales.  Chest:     Chest wall: No tenderness.  Abdominal:     General: There is no distension.     Tenderness: There is no abdominal tenderness. There is no rebound.  Musculoskeletal:        General: Normal range of motion.     Cervical back: Neck supple.  Lymphadenopathy:     Cervical: No cervical adenopathy.  Skin:     Findings: No erythema or rash.  Neurological:     Mental Status: She is alert and oriented to person, place, and time.     Motor: No abnormal muscle tone.     Coordination: Coordination normal.  Psychiatric:        Behavior: Behavior normal.    ED Results / Procedures / Treatments   Labs (all labs ordered are listed, but only abnormal results are displayed) Labs Reviewed  RESP PANEL BY RT-PCR (FLU A&B, COVID) ARPGX2 -  Abnormal; Notable for the following components:      Result Value   Influenza A by PCR POSITIVE (*)    All other components within normal limits    EKG None  Radiology DG Chest 2 View  Result Date: 03/29/2021 CLINICAL DATA:  Shortness of breath with chills, body aches and cough. Worsening asthma for 3 days. EXAM: CHEST - 2 VIEW COMPARISON:  Radiographs 03/13/2021.  CT 07/25/2019. FINDINGS: The heart size and mediastinal contours are normal. The lungs are clear. There is no pleural effusion or pneumothorax. No acute osseous findings are identified. IMPRESSION: Stable chest.  No active cardiopulmonary process. Electronically Signed   By: Richardean Sale M.D.   On: 03/29/2021 14:52    Procedures Procedures   Medications Ordered in ED Medications  albuterol (VENTOLIN HFA) 108 (90 Base) MCG/ACT inhaler 2 puff (has no administration in time range)  predniSONE (DELTASONE) tablet 60 mg (60 mg Oral Given 03/29/21 1547)  ipratropium-albuterol (DUONEB) 0.5-2.5 (3) MG/3ML nebulizer solution 3 mL (3 mLs Nebulization Given 03/29/21 1547)  albuterol (PROVENTIL) (2.5 MG/3ML) 0.083% nebulizer solution 2.5 mg (2.5 mg Nebulization Given 03/29/21 1547)    ED Course  I have reviewed the triage vital signs and the nursing notes.  Pertinent labs & imaging results that were available during my care of the patient were reviewed by me and considered in my medical decision making (see chart for details).    MDM Rules/Calculators/A&P                          patient has influenza.   Patient also has bronchospasm.  Patient will be discharged with Tamiflu and albuterol Final Clinical Impression(s) / ED Diagnoses Final diagnoses:  Influenza    Rx / DC Orders ED Discharge Orders          Ordered    oseltamivir (TAMIFLU) 75 MG capsule  Every 12 hours        03/29/21 1615             Milton Ferguson, MD 03/29/21 1619

## 2021-03-29 NOTE — ED Provider Notes (Signed)
Emergency Medicine Provider Triage Evaluation Note  Wendy Morgan , a 45 y.o. female  was evaluated in triage.  Pt complains of flulike sx for three days, nausea, without vomiting, chills, worsening SOB. Hx of asthma, ran out of inhaler.  Review of Systems  Positive: Wheezing, sob, chills, congestion Negative: Chest pain  Physical Exam  BP 137/81 (BP Location: Left Arm)    Pulse 90    Temp 98.8 F (37.1 C) (Oral)    Resp 18    Ht 5\' 4"  (1.626 m)    Wt 90.7 kg    SpO2 96%    BMI 34.33 kg/m  Gen:   Awake, no distress   Resp:  Normal effort, Diffuse biphasic wheezing MSK:   Moves extremities without difficulty  Other:    Medical Decision Making  Medically screening exam initiated at 1:55 PM.  Appropriate orders placed.  Wendy Morgan was informed that the remainder of the evaluation will be completed by another provider, this initial triage assessment does not replace that evaluation, and the importance of remaining in the ED until their evaluation is complete.  Flulike sx, ashtma exac   Brett Fairy, PA-C 03/29/21 1356    03/31/21, MD 03/29/21 [phone]

## 2021-03-29 NOTE — ED Triage Notes (Signed)
Patient c/o chills, body aches. Cough, worsening asthma x 3 days.  Patient states she is out of her Albuterol inhaler.

## 2021-03-29 NOTE — Discharge Instructions (Signed)
Drink plenty of fluids.  Follow-up with your family doctor if not improving.  Tylenol for any fever or aches.  Use your inhaler every 4-6 hours as needed

## 2021-04-05 ENCOUNTER — Emergency Department (HOSPITAL_COMMUNITY): Payer: Self-pay

## 2021-04-05 ENCOUNTER — Encounter (HOSPITAL_COMMUNITY): Payer: Self-pay

## 2021-04-05 ENCOUNTER — Emergency Department (HOSPITAL_COMMUNITY)
Admission: EM | Admit: 2021-04-05 | Discharge: 2021-04-05 | Disposition: A | Discharge status: Home / Self Care | Payer: Self-pay | Attending: Emergency Medicine | Admitting: Emergency Medicine

## 2021-04-05 ENCOUNTER — Other Ambulatory Visit: Payer: Self-pay

## 2021-04-05 DIAGNOSIS — Z7951 Long term (current) use of inhaled steroids: Secondary | ICD-10-CM | POA: Insufficient documentation

## 2021-04-05 DIAGNOSIS — J4541 Moderate persistent asthma with (acute) exacerbation: Secondary | ICD-10-CM | POA: Insufficient documentation

## 2021-04-05 DIAGNOSIS — Z87891 Personal history of nicotine dependence: Secondary | ICD-10-CM | POA: Insufficient documentation

## 2021-04-05 MED ORDER — PREDNISONE 20 MG PO TABS
60.0000 mg | ORAL_TABLET | Freq: Once | ORAL | Status: AC
  Administered 2021-04-05: 08:00:00 60 mg via ORAL
  Filled 2021-04-05: qty 3

## 2021-04-05 MED ORDER — IPRATROPIUM-ALBUTEROL 0.5-2.5 (3) MG/3ML IN SOLN
3.0000 mL | Freq: Once | RESPIRATORY_TRACT | Status: AC
  Administered 2021-04-05: 07:00:00 3 mL via RESPIRATORY_TRACT
  Filled 2021-04-05: qty 3

## 2021-04-05 MED ORDER — IPRATROPIUM-ALBUTEROL 0.5-2.5 (3) MG/3ML IN SOLN
3.0000 mL | Freq: Once | RESPIRATORY_TRACT | Status: AC
  Administered 2021-04-05: 08:00:00 3 mL via RESPIRATORY_TRACT
  Filled 2021-04-05: qty 3

## 2021-04-05 MED ORDER — PREDNISONE 20 MG PO TABS: ORAL_TABLET | ORAL | 0 refills | Status: DC

## 2021-04-05 MED ORDER — ALBUTEROL SULFATE (2.5 MG/3ML) 0.083% IN NEBU
2.5000 mg | INHALATION_SOLUTION | Freq: Once | RESPIRATORY_TRACT | Status: AC
  Administered 2021-04-05: 08:00:00 2.5 mg via RESPIRATORY_TRACT
  Filled 2021-04-05: qty 3

## 2021-04-05 NOTE — ED Triage Notes (Signed)
Patient presents with c/o asthma exacerbation. Patient endorses difficulty breathing and chest congestion that started today. Pt used Albuterol inhaler at home w/o relief.

## 2021-04-05 NOTE — ED Provider Notes (Signed)
[facility] [place] [facility]-EMERGENCY DEPT Provider Note   CSN: [id number] Arrival date & time: 04/05/21  9735     History Chief Complaint  Patient presents with   Asthma    Wendy Morgan is a 45 y.o. female.  HPI 45 year old female presents with what feels like an asthma exacerbation.  Started last night.  Has had cough, shortness of breath and wheezing.  No fevers, sore throat, leg swelling or chest pain.  States she got over the flu about a week ago.  Tried her albuterol inhaler with no relief.  She has been given an albuterol nebulizer just prior to me seeing her and states she is feeling partially better.  Past Medical History:  Diagnosis Date   Asthma    Bronchiectasis with acute exacerbation [facility])     Patient Active Problem List   Diagnosis Date Noted   Acute recurrent maxillary sinusitis 07/25/2019   Allergic rhinitis with postnasal drip 07/22/2019   Acute recurrent pansinusitis 07/22/2019   Dental caries 07/22/2019   Asthma exacerbation 05/29/2018    Past Surgical History:  Procedure Laterality Date   TUBAL LIGATION       OB History   No obstetric history on file.     Family History  Problem Relation Age of Onset   Healthy Mother    Healthy Father    Kidney disease Neg Hx    Anxiety disorder Neg Hx    Depression Neg Hx     Social History   Tobacco Use   Smoking status: Former    Types: Cigarettes    Quit date: 1998    Years since quitting: 24.9   Smokeless tobacco: Never  Vaping Use   Vaping Use: Never used  Substance Use Topics   Alcohol use: No   Drug use: No    Home Medications Prior to Admission medications   Medication Sig Start Date End Date Taking? Authorizing Provider  predniSONE (DELTASONE) 20 MG tablet 2 tabs po daily x 4 days 04/06/21  Yes Pricilla Loveless, MD  albuterol (PROVENTIL) (2.5 MG/3ML) 0.083% nebulizer solution Take 3 mLs (2.5 mg total) by nebulization every 6 (six) hours as needed for wheezing or shortness  of breath. 02/22/21   Henderly, Britni A, PA-C  albuterol (VENTOLIN HFA) 108 (90 Base) MCG/ACT inhaler Inhale 1-2 puffs into the lungs every 6 (six) hours as needed for wheezing or shortness of breath. 03/13/21   Sloan Leiter, DO  benzonatate (TESSALON) 100 MG capsule Take 1 capsule (100 mg total) by mouth every 8 (eight) hours. 02/22/21   Henderly, Britni A, PA-C  cetirizine (ZYRTEC ALLERGY) 10 MG tablet Take 1 tablet (10 mg total) by mouth daily for 14 days. 03/13/21 03/27/21  Sloan Leiter, DO  cetirizine (ZYRTEC) 10 MG tablet TAKE 1 TABLET (10 MG TOTAL) BY MOUTH DAILY. Patient not taking: Reported on 03/04/2021 05/03/20 05/03/21  Claiborne Rigg, NP  doxycycline (VIBRA-TABS) 100 MG tablet Take 1 tablet (100 mg total) by mouth 2 (two) times daily. 03/04/21   Gilda Crease, MD  fluticasone (FLONASE) 50 MCG/ACT nasal spray PLACE 2 SPRAYS INTO BOTH NOSTRILS IN THE MORNING AND AT BEDTIME. Patient not taking: Reported on 03/04/2021 05/03/20 05/03/21  Claiborne Rigg, NP  fluticasone [facility]) 50 MCG/ACT nasal spray Place 1 spray into both nostrils daily for 14 days. 03/13/21 03/27/21  Tanda Rockers A, DO  mometasone-formoterol (DULERA) 200-5 MCG/ACT AERO INHALE 2 PUFFS INTO THE LUNGS 2 (TWO) TIMES DAILY. 04/19/20 04/19/21  Irene Limbo,  Melton Alar, MD  montelukast (SINGULAIR) 10 MG tablet TAKE 1 TABLET (10 MG TOTAL) BY MOUTH AT BEDTIME. Patient not taking: Reported on 03/04/2021 05/03/20 05/03/21  Claiborne Rigg, NP  oseltamivir (TAMIFLU) 75 MG capsule Take 1 capsule (75 mg total) by mouth every 12 (twelve) hours. 03/29/21   Bethann Berkshire, MD  pantoprazole (PROTONIX) 40 MG tablet TAKE 1 TABLET (40 MG TOTAL) BY MOUTH DAILY. Patient taking differently: Take 40 mg by mouth daily as needed. 05/03/20 05/03/21  Claiborne Rigg, NP    Allergies    Patient has no known allergies.  Review of Systems   Review of Systems  Constitutional:  Negative for fever.  HENT:  Negative for sore throat.   Respiratory:   Positive for cough, shortness of breath and wheezing.   Cardiovascular:  Negative for chest pain and leg swelling.  Gastrointestinal:  Negative for vomiting.  All other systems reviewed and are negative.  Physical Exam Updated Vital Signs BP (!) 138/94    Pulse 70    Temp 97.7 F (36.5 C) (Oral)    Resp 16    Ht 5\' 4"  (1.626 m)    Wt 90.7 kg    SpO2 100%    BMI 34.33 kg/m   Physical Exam Vitals and nursing note reviewed.  Constitutional:      General: She is not in acute distress.    Appearance: She is well-developed. She is not ill-appearing or diaphoretic.  HENT:     Head: Normocephalic and atraumatic.     Right Ear: External ear normal.     Left Ear: External ear normal.     Nose: Nose normal.  Eyes:     General:        Right eye: No discharge.        Left eye: No discharge.  Cardiovascular:     Rate and Rhythm: Normal rate and regular rhythm.     Heart sounds: Normal heart sounds.  Pulmonary:     Effort: Pulmonary effort is normal. No tachypnea, accessory muscle usage or respiratory distress.     Breath sounds: Wheezing (diffuse, expiratory) present.  Abdominal:     General: There is no distension.     Palpations: Abdomen is soft.     Tenderness: There is no abdominal tenderness.  Skin:    General: Skin is warm and dry.  Neurological:     Mental Status: She is alert.  Psychiatric:        Mood and Affect: Mood is not anxious.    ED Results / Procedures / Treatments   Labs (all labs ordered are listed, but only abnormal results are displayed) Labs Reviewed - No data to display  EKG None  Radiology DG Chest Portable 1 View  Result Date: 04/05/2021 CLINICAL DATA:  45 year old female with shortness of breath. EXAM: PORTABLE CHEST 1 VIEW COMPARISON:  Chest radiographs 03/29/2021 and earlier. FINDINGS: Portable AP semi upright view at 0648 hours. Lung volumes and mediastinal contours remain normal. Visualized tracheal air column is within normal limits. Allowing  for portable technique the lungs are clear. No pneumothorax or pleural effusion. No osseous abnormality identified.  Negative visible bowel gas. IMPRESSION: Negative portable chest. Electronically Signed   By: 03/31/2021 M.D.   On: 04/05/2021 07:15    Procedures Procedures   Medications Ordered in ED Medications  ipratropium-albuterol (DUONEB) 0.5-2.5 (3) MG/3ML nebulizer solution 3 mL (3 mLs Nebulization Given 04/05/21 0640)  predniSONE (DELTASONE) tablet 60 mg (60 mg Oral  Given 04/05/21 0739)  ipratropium-albuterol (DUONEB) 0.5-2.5 (3) MG/3ML nebulizer solution 3 mL (3 mLs Nebulization Given 04/05/21 0740)  albuterol (PROVENTIL) (2.5 MG/3ML) 0.083% nebulizer solution 2.5 mg (2.5 mg Nebulization Given 04/05/21 0740)    ED Course  I have reviewed the triage vital signs and the nursing notes.  Pertinent labs & imaging results that were available during my care of the patient were reviewed by me and considered in my medical decision making (see chart for details).    MDM Rules/Calculators/A&P                         Patient presents with an asthma exacerbation.  She is hemodynamically stable.  Chest x-ray ordered in triage has been personally reviewed and shows no obvious pneumonia.  She is feeling much better after a second breathing treatment.  She was given prednisone and will be discharged with a steroid burst.  She feels like she has enough albuterol at home and appears stable for discharge home with return precautions.    Final Clinical Impression(s) / ED Diagnoses Final diagnoses:  Moderate persistent asthma with exacerbation    Rx / DC Orders ED Discharge Orders          Ordered    predniSONE (DELTASONE) 20 MG tablet        04/05/21 3016             Pricilla Loveless, MD 04/05/21 [phone]

## 2021-04-05 NOTE — Discharge Instructions (Signed)
Use your albuterol inhaler 2 puffs every 4 hours for the next 48 hours.  If you need significantly more than this or if you develop fever, coughing up blood, new or worsening shortness of breath, chest pain, or any other new/concerning symptoms then return to the ER for evaluation.

## 2021-04-05 NOTE — ED Notes (Signed)
Pt ambulatory to RM 17 from triage w/o assistance

## 2021-04-08 ENCOUNTER — Emergency Department (HOSPITAL_COMMUNITY)
Admission: EM | Admit: 2021-04-08 | Discharge: 2021-04-08 | Disposition: A | Discharge status: Home / Self Care | Payer: Self-pay | Attending: Emergency Medicine | Admitting: Emergency Medicine

## 2021-04-08 DIAGNOSIS — J4531 Mild persistent asthma with (acute) exacerbation: Secondary | ICD-10-CM | POA: Insufficient documentation

## 2021-04-08 DIAGNOSIS — Z87891 Personal history of nicotine dependence: Secondary | ICD-10-CM | POA: Insufficient documentation

## 2021-04-08 DIAGNOSIS — Z7951 Long term (current) use of inhaled steroids: Secondary | ICD-10-CM | POA: Insufficient documentation

## 2021-04-08 MED ORDER — PREDNISONE 20 MG PO TABS
40.0000 mg | ORAL_TABLET | Freq: Once | ORAL | Status: AC
  Administered 2021-04-08: 03:00:00 40 mg via ORAL
  Filled 2021-04-08: qty 2

## 2021-04-08 MED ORDER — ALBUTEROL SULFATE HFA 108 (90 BASE) MCG/ACT IN AERS
6.0000 | INHALATION_SPRAY | Freq: Once | RESPIRATORY_TRACT | Status: AC
  Administered 2021-04-08: 03:00:00 6 via RESPIRATORY_TRACT
  Filled 2021-04-08: qty 6.7

## 2021-04-08 MED ORDER — AEROCHAMBER Z-STAT PLUS/MEDIUM MISC
1.0000 | Freq: Once | Status: AC
  Administered 2021-04-08: 03:00:00 1
  Filled 2021-04-08: qty 1

## 2021-04-08 MED ORDER — IPRATROPIUM BROMIDE HFA 17 MCG/ACT IN AERS
2.0000 | INHALATION_SPRAY | Freq: Once | RESPIRATORY_TRACT | Status: AC
  Administered 2021-04-08: 03:00:00 2 via RESPIRATORY_TRACT
  Filled 2021-04-08: qty 12.9

## 2021-04-08 NOTE — ED Provider Notes (Signed)
[facility]-EMERGENCY DEPT Provider Note  CSN: [id number] Arrival date & time: 04/08/21 0249  Chief Complaint(s) Shortness of Breath  HPI Wendy Morgan is a 45 y.o. female with a history of asthma seen 2 days ago for asthma exacerbation, treated with albuterol, and steroids.  She returns today for shortness of breath not controlled with the albuterol puffs.  She feels like she is got chest congestion.  No overt pain.  Shortness of breath is mild.  No fevers or chills.  Reports having influenza 1 to 2 weeks ago.   Shortness of Breath  Past Medical History Past Medical History:  Diagnosis Date   Asthma    Bronchiectasis with acute exacerbation [facility])    Patient Active Problem List   Diagnosis Date Noted   Acute recurrent maxillary sinusitis 07/25/2019   Allergic rhinitis with postnasal drip 07/22/2019   Acute recurrent pansinusitis 07/22/2019   Dental caries 07/22/2019   Asthma exacerbation 05/29/2018   Home Medication(s) Prior to Admission medications   Medication Sig Start Date End Date Taking? Authorizing Provider  albuterol (PROVENTIL) (2.5 MG/3ML) 0.083% nebulizer solution Take 3 mLs (2.5 mg total) by nebulization every 6 (six) hours as needed for wheezing or shortness of breath. 02/22/21   Henderly, Britni A, PA-C  albuterol (VENTOLIN HFA) 108 (90 Base) MCG/ACT inhaler Inhale 1-2 puffs into the lungs every 6 (six) hours as needed for wheezing or shortness of breath. 03/13/21   Sloan Leiter, DO  benzonatate (TESSALON) 100 MG capsule Take 1 capsule (100 mg total) by mouth every 8 (eight) hours. 02/22/21   Henderly, Britni A, PA-C  cetirizine (ZYRTEC ALLERGY) 10 MG tablet Take 1 tablet (10 mg total) by mouth daily for 14 days. 03/13/21 03/27/21  Sloan Leiter, DO  cetirizine (ZYRTEC) 10 MG tablet TAKE 1 TABLET (10 MG TOTAL) BY MOUTH DAILY. Patient not taking: Reported on 03/04/2021 05/03/20 05/03/21  Claiborne Rigg, NP  doxycycline (VIBRA-TABS) 100 MG  tablet Take 1 tablet (100 mg total) by mouth 2 (two) times daily. 03/04/21   Gilda Crease, MD  fluticasone (FLONASE) 50 MCG/ACT nasal spray PLACE 2 SPRAYS INTO BOTH NOSTRILS IN THE MORNING AND AT BEDTIME. Patient not taking: Reported on 03/04/2021 05/03/20 05/03/21  Claiborne Rigg, NP  fluticasone [facility]) 50 MCG/ACT nasal spray Place 1 spray into both nostrils daily for 14 days. 03/13/21 03/27/21  Tanda Rockers A, DO  mometasone-formoterol (DULERA) 200-5 MCG/ACT AERO INHALE 2 PUFFS INTO THE LUNGS 2 (TWO) TIMES DAILY. 04/19/20 04/19/21  Standley Brooking, MD  montelukast (SINGULAIR) 10 MG tablet TAKE 1 TABLET (10 MG TOTAL) BY MOUTH AT BEDTIME. Patient not taking: Reported on 03/04/2021 05/03/20 05/03/21  Claiborne Rigg, NP  oseltamivir (TAMIFLU) 75 MG capsule Take 1 capsule (75 mg total) by mouth every 12 (twelve) hours. 03/29/21   Bethann Berkshire, MD  pantoprazole (PROTONIX) 40 MG tablet TAKE 1 TABLET (40 MG TOTAL) BY MOUTH DAILY. Patient taking differently: Take 40 mg by mouth daily as needed. 05/03/20 05/03/21  Claiborne Rigg, NP  predniSONE (DELTASONE) 20 MG tablet 2 tabs po daily x 4 days 04/06/21   Pricilla Loveless, MD  Past Surgical History Past Surgical History:  Procedure Laterality Date   TUBAL LIGATION     Family History Family History  Problem Relation Age of Onset   Healthy Mother    Healthy Father    Kidney disease Neg Hx    Anxiety disorder Neg Hx    Depression Neg Hx     Social History Social History   Tobacco Use   Smoking status: Former    Types: Cigarettes    Quit date: 1998    Years since quitting: 24.9   Smokeless tobacco: Never  Vaping Use   Vaping Use: Never used  Substance Use Topics   Alcohol use: No   Drug use: No   Allergies Patient has no known allergies.  Review of Systems Review of Systems  Respiratory:   Positive for shortness of breath.   All other systems are reviewed and are negative for acute change except as noted in the HPI  Physical Exam Vital Signs  I have reviewed the triage vital signs BP (!) 139/97 (BP Location: Right Arm)    Pulse 80    Temp 98 F (36.7 C) (Oral)    Resp 20    Ht 5\' 4"  (1.626 m)    Wt 90.7 kg    SpO2 96%    BMI 34.33 kg/m   Physical Exam Vitals reviewed.  Constitutional:      General: She is not in acute distress.    Appearance: She is well-developed. She is not diaphoretic.  HENT:     Head: Normocephalic and atraumatic.     Nose: Nose normal.  Eyes:     General: No scleral icterus.       Right eye: No discharge.        Left eye: No discharge.     Conjunctiva/sclera: Conjunctivae normal.     Pupils: Pupils are equal, round, and reactive to light.  Cardiovascular:     Rate and Rhythm: Normal rate and regular rhythm.     Heart sounds: No murmur heard.   No friction rub. No gallop.  Pulmonary:     Effort: Pulmonary effort is normal. No respiratory distress.     Breath sounds: No stridor. Wheezing (diffuse insp and exp) present. No rales.  Abdominal:     General: There is no distension.     Palpations: Abdomen is soft.     Tenderness: There is no abdominal tenderness.  Musculoskeletal:        General: No tenderness.     Cervical back: Normal range of motion and neck supple.  Skin:    General: Skin is warm and dry.     Findings: No erythema or rash.  Neurological:     Mental Status: She is alert and oriented to person, place, and time.    ED Results and Treatments Labs (all labs ordered are listed, but only abnormal results are displayed) Labs Reviewed - No data to display  EKG  EKG Interpretation  Date/Time:    Ventricular Rate:    PR Interval:    QRS Duration:   QT Interval:    QTC Calculation:   R Axis:     Text  Interpretation:         Radiology No results found.  Pertinent labs & imaging results that were available during my care of the patient were reviewed by me and considered in my medical decision making (see MDM for details).  Medications Ordered in ED Medications  predniSONE (DELTASONE) tablet 40 mg (40 mg Oral Given 04/08/21 0324)  albuterol (VENTOLIN HFA) 108 (90 Base) MCG/ACT inhaler 6 puff (6 puffs Inhalation Given 04/08/21 0324)  ipratropium (ATROVENT HFA) inhaler 2 puff (2 puffs Inhalation Given 04/08/21 0324)  aerochamber Z-Stat Plus/medium 1 each (1 each Other Given 04/08/21 0324)                                                                                                                                     Procedures Procedures  (including critical care time)  Medical Decision Making / ED Course I have reviewed the nursing notes for this encounter and the patient's prior records (if available in EHR or on provided paperwork).  Champayne GERALDY AKRIDGE was evaluated in Emergency Department on 04/08/2021 for the symptoms described in the history of present illness. She was evaluated in the context of the global COVID-19 pandemic, which necessitated consideration that the patient might be at risk for infection with the SARS-CoV-2 virus that causes COVID-19. Institutional protocols and algorithms that pertain to the evaluation of patients at risk for COVID-19 are in a state of rapid change based on information released by regulatory bodies including the CDC and federal and state organizations. These policies and algorithms were followed during the patient's care in the ED.     Consistent with asthma exacerbation. Sating well on room air. No increased WOB. Educated on proper use of AeroChamber. Given high-dose puffs of albuterol and Atrovent. Oral dose of prednisone.   No need for chest x-ray at this time.  Pertinent labs & imaging results that were available during my care of the  patient were reviewed by me and considered in my medical decision making:  SOB and wheezing improved.  Final Clinical Impression(s) / ED Diagnoses Final diagnoses:  Mild persistent asthma with exacerbation   The patient appears reasonably screened and/or stabilized for discharge and I doubt any other medical condition or other [facility] requiring further screening, evaluation, or treatment in the ED at this time prior to discharge. Safe for discharge with strict return precautions.  Disposition: Discharge  Condition: Good  I have discussed the results, Dx and Tx plan with the patient/family who expressed understanding and agree(s) with the plan. Discharge instructions discussed at length. The patient/family was given strict return precautions who verbalized understanding of the instructions. No further questions at time of discharge.  ED Discharge Orders     None         Follow Up: Claiborne Rigg, NP [address] [place] [address] [phone]  Call  as needed     This chart was dictated using voice recognition software.  Despite best efforts to proofread,  errors can occur which can change the documentation meaning.    Nira Conn, MD 04/08/21 [phone]

## 2021-04-08 NOTE — ED Triage Notes (Signed)
Pt came in with worsening SOB times two days. Hx of asthma. Has been using inhalers at home. O2 saturations 97 to 100%

## 2021-04-11 ENCOUNTER — Emergency Department (HOSPITAL_COMMUNITY): Payer: Self-pay

## 2021-04-11 ENCOUNTER — Encounter (HOSPITAL_COMMUNITY): Payer: Self-pay

## 2021-04-11 ENCOUNTER — Observation Stay (HOSPITAL_COMMUNITY)
Admission: EM | Admit: 2021-04-11 | Discharge: 2021-04-12 | Disposition: A | Discharge status: Home / Self Care | Payer: Self-pay | Attending: Family Medicine | Admitting: Family Medicine

## 2021-04-11 ENCOUNTER — Other Ambulatory Visit: Payer: Self-pay

## 2021-04-11 DIAGNOSIS — D72829 Elevated white blood cell count, unspecified: Secondary | ICD-10-CM | POA: Insufficient documentation

## 2021-04-11 DIAGNOSIS — J4541 Moderate persistent asthma with (acute) exacerbation: Secondary | ICD-10-CM

## 2021-04-11 DIAGNOSIS — Z20822 Contact with and (suspected) exposure to covid-19: Secondary | ICD-10-CM | POA: Insufficient documentation

## 2021-04-11 DIAGNOSIS — Z87891 Personal history of nicotine dependence: Secondary | ICD-10-CM | POA: Insufficient documentation

## 2021-04-11 DIAGNOSIS — Z79899 Other long term (current) drug therapy: Secondary | ICD-10-CM | POA: Insufficient documentation

## 2021-04-11 DIAGNOSIS — E669 Obesity, unspecified: Secondary | ICD-10-CM | POA: Diagnosis present

## 2021-04-11 DIAGNOSIS — J45901 Unspecified asthma with (acute) exacerbation: Principal | ICD-10-CM | POA: Insufficient documentation

## 2021-04-11 DIAGNOSIS — E66811 Obesity, class 1: Secondary | ICD-10-CM | POA: Diagnosis present

## 2021-04-11 HISTORY — DX: Obesity, class 1: E66.811

## 2021-04-11 HISTORY — DX: Obesity, unspecified: E66.9

## 2021-04-11 LAB — COMPREHENSIVE METABOLIC PANEL
ALT: 14 U/L (ref 0–44)
AST: 18 U/L (ref 15–41)
Albumin: 3.5 g/dL (ref 3.5–5.0)
Alkaline Phosphatase: 59 U/L (ref 38–126)
Anion gap: 8 (ref 5–15)
BUN: 12 mg/dL (ref 6–20)
CO2: 27 mmol/L (ref 22–32)
Calcium: 8.5 mg/dL — ABNORMAL LOW (ref 8.9–10.3)
Chloride: 104 mmol/L (ref 98–111)
Creatinine, Ser: 0.93 mg/dL (ref 0.44–1.00)
GFR, Estimated: >60 mL/min (ref >60)
Glucose, Bld: 89 mg/dL (ref 70–99)
Potassium: 3.6 mmol/L (ref 3.5–5.1)
Sodium: 139 mmol/L (ref 135–145)
Total Bilirubin: 0.5 mg/dL (ref 0.3–1.2)
Total Protein: 6.9 g/dL (ref 6.5–8.1)

## 2021-04-11 LAB — CBC WITH DIFFERENTIAL/PLATELET
Abs Immature Granulocytes: 0.12 10*3/uL — ABNORMAL HIGH (ref 0.00–0.07)
Basophils Absolute: 0.1 10*3/uL (ref 0.0–0.1)
Basophils Relative: 1 %
Eosinophils Absolute: 0.8 10*3/uL — ABNORMAL HIGH (ref 0.0–0.5)
Eosinophils Relative: 6 %
HCT: 41.1 % (ref 36.0–46.0)
Hemoglobin: 13.4 g/dL (ref 12.0–15.0)
Immature Granulocytes: 1 %
Lymphocytes Relative: 25 %
Lymphs Abs: 3.3 10*3/uL (ref 0.7–4.0)
MCH: 28.8 pg (ref 26.0–34.0)
MCHC: 32.6 g/dL (ref 30.0–36.0)
MCV: 88.4 fL (ref 80.0–100.0)
Monocytes Absolute: 1.4 10*3/uL — ABNORMAL HIGH (ref 0.1–1.0)
Monocytes Relative: 10 %
Neutro Abs: 7.8 10*3/uL — ABNORMAL HIGH (ref 1.7–7.7)
Neutrophils Relative %: 57 %
Platelets: 379 10*3/uL (ref 150–400)
RBC: 4.65 MIL/uL (ref 3.87–5.11)
RDW: 15.1 % (ref 11.5–15.5)
WBC: 13.4 10*3/uL — ABNORMAL HIGH (ref 4.0–10.5)
nRBC: 0 % (ref 0.0–0.2)

## 2021-04-11 LAB — RESP PANEL BY RT-PCR (FLU A&B, COVID) ARPGX2
Influenza A by PCR: NEGATIVE
Influenza B by PCR: NEGATIVE
SARS Coronavirus 2 by RT PCR: NEGATIVE

## 2021-04-11 MED ORDER — IPRATROPIUM BROMIDE 0.02 % IN SOLN
0.5000 mg | Freq: Once | RESPIRATORY_TRACT | Status: AC
  Administered 2021-04-11: 05:00:00 0.5 mg via RESPIRATORY_TRACT
  Filled 2021-04-11: qty 2.5

## 2021-04-11 MED ORDER — ALBUTEROL SULFATE (2.5 MG/3ML) 0.083% IN NEBU
5.0000 mg | INHALATION_SOLUTION | Freq: Once | RESPIRATORY_TRACT | Status: AC
  Administered 2021-04-11: 03:00:00 5 mg via RESPIRATORY_TRACT
  Filled 2021-04-11: qty 6

## 2021-04-11 MED ORDER — IPRATROPIUM-ALBUTEROL 0.5-2.5 (3) MG/3ML IN SOLN
3.0000 mL | Freq: Four times a day (QID) | RESPIRATORY_TRACT | Status: DC
  Administered 2021-04-11: 09:00:00 3 mL via RESPIRATORY_TRACT
  Filled 2021-04-11: qty 3

## 2021-04-11 MED ORDER — ONDANSETRON HCL 4 MG PO TABS: 4.0000 mg | ORAL_TABLET | Freq: Four times a day (QID) | ORAL | Status: DC | PRN

## 2021-04-11 MED ORDER — ALBUTEROL SULFATE (2.5 MG/3ML) 0.083% IN NEBU: 2.5000 mg | INHALATION_SOLUTION | RESPIRATORY_TRACT | Status: DC | PRN

## 2021-04-11 MED ORDER — LORATADINE 10 MG PO TABS
10.0000 mg | ORAL_TABLET | Freq: Every day | ORAL | Status: DC
  Administered 2021-04-11 – 2021-04-12 (×2): 10 mg via ORAL
  Filled 2021-04-11 (×2): qty 1

## 2021-04-11 MED ORDER — ALBUTEROL SULFATE (2.5 MG/3ML) 0.083% IN NEBU
5.0000 mg | INHALATION_SOLUTION | Freq: Once | RESPIRATORY_TRACT | Status: AC
  Administered 2021-04-11: 04:00:00 5 mg via RESPIRATORY_TRACT
  Filled 2021-04-11: qty 6

## 2021-04-11 MED ORDER — METHYLPREDNISOLONE SODIUM SUCC 125 MG IJ SOLR
125.0000 mg | Freq: Once | INTRAMUSCULAR | Status: AC
  Administered 2021-04-11: 03:00:00 125 mg via INTRAVENOUS
  Filled 2021-04-11: qty 2

## 2021-04-11 MED ORDER — ALBUTEROL SULFATE (2.5 MG/3ML) 0.083% IN NEBU
5.0000 mg | INHALATION_SOLUTION | Freq: Once | RESPIRATORY_TRACT | Status: AC
  Administered 2021-04-11: 06:00:00 5 mg via RESPIRATORY_TRACT
  Filled 2021-04-11: qty 6

## 2021-04-11 MED ORDER — ONDANSETRON HCL 4 MG/2ML IJ SOLN: 4.0000 mg | Freq: Four times a day (QID) | INTRAMUSCULAR | Status: DC | PRN

## 2021-04-11 MED ORDER — PREDNISONE 20 MG PO TABS
40.0000 mg | ORAL_TABLET | Freq: Every day | ORAL | Status: DC
  Administered 2021-04-12: 09:00:00 40 mg via ORAL
  Filled 2021-04-11 (×2): qty 2

## 2021-04-11 MED ORDER — IPRATROPIUM BROMIDE 0.02 % IN SOLN
0.5000 mg | Freq: Once | RESPIRATORY_TRACT | Status: AC
  Administered 2021-04-11: 03:00:00 0.5 mg via RESPIRATORY_TRACT
  Filled 2021-04-11: qty 2.5

## 2021-04-11 MED ORDER — ALBUTEROL (5 MG/ML) CONTINUOUS INHALATION SOLN
10.0000 mg/h | INHALATION_SOLUTION | RESPIRATORY_TRACT | Status: DC
  Filled 2021-04-11: qty 20

## 2021-04-11 MED ORDER — MONTELUKAST SODIUM 10 MG PO TABS
10.0000 mg | ORAL_TABLET | Freq: Once | ORAL | Status: AC
  Administered 2021-04-11: 12:00:00 10 mg via ORAL
  Filled 2021-04-11: qty 1

## 2021-04-11 MED ORDER — ACETAMINOPHEN 650 MG RE SUPP: 650.0000 mg | Freq: Four times a day (QID) | RECTAL | Status: DC | PRN

## 2021-04-11 MED ORDER — MONTELUKAST SODIUM 10 MG PO TABS
10.0000 mg | ORAL_TABLET | Freq: Every day | ORAL | Status: DC
  Administered 2021-04-11: 22:00:00 10 mg via ORAL
  Filled 2021-04-11: qty 1

## 2021-04-11 MED ORDER — METHYLPREDNISOLONE SODIUM SUCC 40 MG IJ SOLR
40.0000 mg | Freq: Two times a day (BID) | INTRAMUSCULAR | Status: AC
  Administered 2021-04-11: 12:00:00 40 mg via INTRAVENOUS
  Filled 2021-04-11: qty 1

## 2021-04-11 MED ORDER — POTASSIUM CHLORIDE CRYS ER 20 MEQ PO TBCR
40.0000 meq | EXTENDED_RELEASE_TABLET | Freq: Once | ORAL | Status: AC
  Administered 2021-04-11: 10:00:00 40 meq via ORAL
  Filled 2021-04-11: qty 2

## 2021-04-11 MED ORDER — MAGNESIUM SULFATE 2 GM/50ML IV SOLN
2.0000 g | Freq: Once | INTRAVENOUS | Status: AC
  Administered 2021-04-11: 06:00:00 2 g via INTRAVENOUS
  Filled 2021-04-11: qty 50

## 2021-04-11 MED ORDER — ACETAMINOPHEN 325 MG PO TABS: 650.0000 mg | ORAL_TABLET | Freq: Four times a day (QID) | ORAL | Status: DC | PRN

## 2021-04-11 MED ORDER — IPRATROPIUM-ALBUTEROL 0.5-2.5 (3) MG/3ML IN SOLN
3.0000 mL | Freq: Three times a day (TID) | RESPIRATORY_TRACT | Status: DC
  Administered 2021-04-11 – 2021-04-12 (×3): 3 mL via RESPIRATORY_TRACT
  Filled 2021-04-11 (×3): qty 3

## 2021-04-11 MED ORDER — ENOXAPARIN SODIUM 40 MG/0.4ML IJ SOSY
40.0000 mg | PREFILLED_SYRINGE | INTRAMUSCULAR | Status: DC
  Administered 2021-04-11 – 2021-04-12 (×2): 40 mg via SUBCUTANEOUS
  Filled 2021-04-11 (×2): qty 0.4

## 2021-04-11 MED ORDER — IPRATROPIUM BROMIDE 0.02 % IN SOLN
0.5000 mg | Freq: Once | RESPIRATORY_TRACT | Status: AC
  Administered 2021-04-11: 06:00:00 0.5 mg via RESPIRATORY_TRACT
  Filled 2021-04-11: qty 2.5

## 2021-04-11 NOTE — H&P (Signed)
History and Physical    Wendy Morgan [id number] DOB: July 24, 1975 DOA: 04/11/2021  PCP: Gildardo Pounds, NP  Patient coming from: Home.  I have personally briefly reviewed patient's old medical records in [place]  Chief Complaint: Shortness of breath.  HPI: Wendy Morgan is a 45 y.o. female with medical history significant of persistent asthma, history of bronchiectasis with acute exacerbation, last 1 obesity who has had multiple visits to the emergency department in the past few weeks due to persistent asthma exacerbation.  The patient stated due to the changing weather since about a month she has been having difficulty breathing associated with dry cough and wheezing.  She denied fever, chills, rhinorrhea, sore throat or hemoptysis.  Pleuritic chest pain when coughing and post breathing treatment occasional palpitations, but no dizziness, diaphoresis, PND, orthopnea or pitting edema of the lower extremities.  No typical chest pain.  Denied abdominal pain, nausea, emesis, diarrhea, constipation, melena or hematochezia.  No dysuria, frequency or hematuria.  No polyuria, polydipsia, polyphagia or blurred vision.  ED Course: Initial vital signs were temperature 97.9 F, pulse 88, respirations 22, BP 146/91 mmHg and O2 sat 98% on room air.  The patient received 2 albuterol 5 mg plus ipratropium 0.5 mg neb treatment, magnesium sulfate 2 g IVPB and 125 mg of methylprednisolone IVP.  Lab work: CBC is her white count of 13.4, hemoglobin 13.4 g/dL platelets 379.  CMP is normal when calcium is corrected to albumin level.  Coronavirus and influenza PCR was negative.  Imaging: A portable 1 view chest radiograph did not show any active disease.  Review of Systems: As per HPI otherwise all other systems reviewed and are negative.  Past Medical History:  Diagnosis Date   Asthma    Bronchiectasis with acute exacerbation ([place])    Class 1 obesity 04/11/2021    Past Surgical History:   Procedure Laterality Date   TUBAL LIGATION      Social History  reports that she quit smoking about 25 years ago. She has never used smokeless tobacco. She reports that she does not drink alcohol and does not use drugs.  No Known Allergies  Family History  Problem Relation Age of Onset   Healthy Mother    Healthy Father    Kidney disease Neg Hx    Anxiety disorder Neg Hx    Depression Neg Hx    Prior to Admission medications   Medication Sig Start Date End Date Taking? Authorizing Provider  albuterol (VENTOLIN HFA) 108 (90 Base) MCG/ACT inhaler Inhale 1-2 puffs into the lungs every 6 (six) hours as needed for wheezing or shortness of breath. 03/13/21  Yes Wynona Dove A, DO  fluticasone (FLONASE) 50 MCG/ACT nasal spray Place 1 spray into both nostrils daily for 14 days. 03/13/21 04/11/22 Yes Wynona Dove A, DO  albuterol (PROVENTIL) (2.5 MG/3ML) 0.083% nebulizer solution Take 3 mLs (2.5 mg total) by nebulization every 6 (six) hours as needed for wheezing or shortness of breath. Patient not taking: Reported on 04/11/2021 02/22/21   Henderly, Britni A, PA-C  cetirizine (ZYRTEC ALLERGY) 10 MG tablet Take 1 tablet (10 mg total) by mouth daily for 14 days. Patient not taking: Reported on 04/11/2021 03/13/21 04/11/22  Jeanell Sparrow, DO   Physical Exam: Vitals:   04/11/21 0400 04/11/21 0415 04/11/21 0430 04/11/21 0754  BP: 140/86 (!) 147/84 (!) 144/88 116/71  Pulse: 76 85 82 86  Resp: (!) 23 18 19    Temp:  TempSrc:      SpO2: 97% 93% 100% 98%   Constitutional: NAD, calm, comfortable Eyes: PERRL, lids and conjunctivae normal ENMT: Mucous membranes are moist. Posterior pharynx clear of any exudate or lesions. Neck: normal, supple, no masses, no thyromegaly Respiratory: Decreased breath sounds with throughout bilateral wheezing, no crackles. Normal respiratory effort. No accessory muscle use.  Cardiovascular: Regular rate and rhythm, no murmurs / rubs / gallops. No extremity  edema. 2+ pedal pulses. No carotid bruits.  Abdomen: no tenderness, no masses palpated. No hepatosplenomegaly. Bowel sounds positive.  Musculoskeletal: no clubbing / cyanosis. Good ROM, no contractures. Normal muscle tone.  Skin: no rashes, lesions, ulcers. No induration Neurologic: CN 2-12 grossly intact. Sensation intact, DTR normal. Strength 5/5 in all 4.  Psychiatric: Normal judgment and insight. Alert and oriented x 3. Normal mood.   Labs on Admission: I have personally reviewed following labs and imaging studies  CBC: Recent Labs  Lab 04/11/21 0207  WBC 13.4*  NEUTROABS 7.8*  HGB 13.4  HCT 41.1  MCV 88.4  PLT [id number]    Basic Metabolic Panel: Recent Labs  Lab 04/11/21 0207  NA 139  K 3.6  CL 104  CO2 27  GLUCOSE 89  BUN 12  CREATININE 0.93  CALCIUM 8.5*    GFR: Estimated Creatinine Clearance: 82.5 mL/min (by C-G formula based on SCr of 0.93 mg/dL).  Liver Function Tests: Recent Labs  Lab 04/11/21 0207  AST 18  ALT 14  ALKPHOS 59  BILITOT 0.5  PROT 6.9  ALBUMIN 3.5   Radiological Exams on Admission: DG Chest Portable 1 View  Result Date: 04/11/2021 CLINICAL DATA:  Cough. EXAM: PORTABLE CHEST 1 VIEW COMPARISON:  Chest radiograph dated 04/05/2021. FINDINGS: The heart size and mediastinal contours are within normal limits. Both lungs are clear. The visualized skeletal structures are unremarkable. IMPRESSION: No active disease. Electronically Signed   By: Anner Crete M.D.   On: 04/11/2021 02:33    EKG: Independently reviewed.  Vent. rate 87 BPM PR interval 157 ms QRS duration 97 ms QT/QTcB 376/453 ms P-R-T axes 44 37 40 Sinus rhythm  Assessment/Plan Principal Problem:   Asthma exacerbation Observation/MedSurg. Supplemental oxygen as needed. Scheduled and as needed bronchodilators. Continue methylprednisolone 40 mg x 1 dose. Prednisone taper beginning tomorrow.  Active Problems:   Class 1 obesity Lifestyle modifications. Follow-up with  primary general practitioner.    Leukocytosis Secondary to glucocorticoid use.    DVT prophylaxis: Lovenox SQ. Code Status:   Full code. Family Communication:   Disposition Plan:   Patient is from:  Home.  Anticipated DC to:  Home.  Anticipated DC date:  04/12/2021.  Anticipated DC barriers: Clinical condition.  Consults called:   Admission status:  Observation/MedSurg.  Severity of Illness:  High severity in the setting of multiple visits to the emergency department due to persistent asthma exacerbation.  Reubin Milan MD [facility]  How to contact the [facility] Attending or Consulting provider [place] or covering provider during after hours [place], for this patient?   Check the care team in [facility] and look for a) attending/consulting [facility] provider listed and b) the [facility] team listed Log into [email or web address] and use [place]'s universal password to access. If you do not have the password, please contact the hospital operator. Locate the [facility] provider you are looking for under [facility] and page to a number that you can be directly reached. If you still have difficulty reaching the provider, please page the [facility] (  Director on Call) for the Hospitalists listed on amion for assistance.  04/11/2021, 8:09 AM   This document was prepared using [employer] and may contain some unintended transcription errors.

## 2021-04-11 NOTE — Plan of Care (Signed)

## 2021-04-11 NOTE — Progress Notes (Signed)
Received patient from ED.  Patient is alert and oriented x 4.  Ambulatory, gait and balance stable.  Assisted in position of comfort in bed.  No complaints at this time.  Oriented to room and unit routine.  Call bell within reach.  Needs addressed.

## 2021-04-11 NOTE — ED Provider Notes (Signed)
[facility]-EMERGENCY DEPT Provider Note   CSN: [id number] Arrival date & time: 04/11/21  0201     History Chief Complaint  Patient presents with   Shortness of Breath   Cough    Wendy Morgan is a 45 y.o. female presents to the emergency department with asthma exacerbation.  She has been seen numerous times (7) since the beginning of November.  Patient last seen on 04/08/21 for ongoing asthma.  She was seen on 04/05/2021 for asthma and treated with albuterol and steroids.  On 1223 she was treated with albuterol and steroids.  She reports she took her last steroid yesterday but she has had worsening shortness of breath over the last 2 to 3 days.  She has a rescue inhaler at home but does not have a nebulizer.  Reports her last hospitalization for asthma was approximately 1 year ago.  She has never been intubated for her asthma.  Denies sick contacts, fever, chills, nasal congestion, nausea or vomiting.  States she did have influenza several weeks ago and things seem to have been worse since that time.  She has no chest pain.  The history is provided by the patient and medical records. No language interpreter was used.      Past Medical History:  Diagnosis Date   Asthma    Bronchiectasis with acute exacerbation [facility])     Patient Active Problem List   Diagnosis Date Noted   Acute recurrent maxillary sinusitis 07/25/2019   Allergic rhinitis with postnasal drip 07/22/2019   Acute recurrent pansinusitis 07/22/2019   Dental caries 07/22/2019   Asthma exacerbation 05/29/2018    Past Surgical History:  Procedure Laterality Date   TUBAL LIGATION       OB History   No obstetric history on file.     Family History  Problem Relation Age of Onset   Healthy Mother    Healthy Father    Kidney disease Neg Hx    Anxiety disorder Neg Hx    Depression Neg Hx     Social History   Tobacco Use   Smoking status: Former    Types: Cigarettes    Quit date:  1998    Years since quitting: 25.0   Smokeless tobacco: Never  Vaping Use   Vaping Use: Never used  Substance Use Topics   Alcohol use: No   Drug use: No    Home Medications Prior to Admission medications   Medication Sig Start Date End Date Taking? Authorizing Provider  albuterol (VENTOLIN HFA) 108 (90 Base) MCG/ACT inhaler Inhale 1-2 puffs into the lungs every 6 (six) hours as needed for wheezing or shortness of breath. 03/13/21  Yes Tanda Rockers A, DO  fluticasone (FLONASE) 50 MCG/ACT nasal spray Place 1 spray into both nostrils daily for 14 days. 03/13/21 04/11/22 Yes Tanda Rockers A, DO  albuterol (PROVENTIL) (2.5 MG/3ML) 0.083% nebulizer solution Take 3 mLs (2.5 mg total) by nebulization every 6 (six) hours as needed for wheezing or shortness of breath. Patient not taking: Reported on 04/11/2021 02/22/21   Henderly, Britni A, PA-C  cetirizine (ZYRTEC ALLERGY) 10 MG tablet Take 1 tablet (10 mg total) by mouth daily for 14 days. Patient not taking: Reported on 04/11/2021 03/13/21 04/11/22  Sloan Leiter, DO    Allergies    Patient has no known allergies.  Review of Systems   Review of Systems  Constitutional:  Negative for appetite change, diaphoresis, fatigue, fever and unexpected weight change.  HENT:  Negative for mouth sores.   Eyes:  Negative for visual disturbance.  Respiratory:  Positive for cough, chest tightness, shortness of breath and wheezing.   Cardiovascular:  Negative for chest pain.  Gastrointestinal:  Negative for abdominal pain, constipation, diarrhea, nausea and vomiting.  Endocrine: Negative for polydipsia, polyphagia and polyuria.  Genitourinary:  Negative for dysuria, frequency, hematuria and urgency.  Musculoskeletal:  Negative for back pain and neck stiffness.  Skin:  Negative for rash.  Allergic/Immunologic: Negative for immunocompromised state.  Neurological:  Negative for syncope, light-headedness and headaches.  Hematological:  Does not  bruise/bleed easily.  Psychiatric/Behavioral:  Negative for sleep disturbance. The patient is not nervous/anxious.    Physical Exam Updated Vital Signs BP (!) 146/91 (BP Location: Right Arm)    Pulse 88    Temp 97.9 F (36.6 C) (Oral)    Resp (!) 22    SpO2 98%   Physical Exam Vitals and nursing note reviewed.  Constitutional:      General: She is not in acute distress.    Appearance: She is not diaphoretic.  HENT:     Head: Normocephalic.  Eyes:     General: No scleral icterus.    Conjunctiva/sclera: Conjunctivae normal.  Cardiovascular:     Rate and Rhythm: Normal rate and regular rhythm.     Pulses: Normal pulses.          Radial pulses are 2+ on the right side and 2+ on the left side.  Pulmonary:     Effort: Tachypnea, accessory muscle usage, prolonged expiration and retractions present. No respiratory distress.     Breath sounds: No stridor. Wheezing (inspiratory and expiratory throughout) present.     Comments: Equal chest rise. Moderate increased work of breathing. Abdominal:     General: There is no distension.     Palpations: Abdomen is soft.     Tenderness: There is no abdominal tenderness. There is no guarding or rebound.  Musculoskeletal:     Cervical back: Normal range of motion.     Comments: Moves all extremities equally and without difficulty.  Skin:    General: Skin is warm and dry.     Capillary Refill: Capillary refill takes less than 2 seconds.  Neurological:     Mental Status: She is alert.     GCS: GCS eye subscore is 4. GCS verbal subscore is 5. GCS motor subscore is 6.     Comments: Speech is clear and goal oriented.  Psychiatric:        Mood and Affect: Mood normal.    ED Results / Procedures / Treatments   Labs (all labs ordered are listed, but only abnormal results are displayed) Labs Reviewed  CBC WITH DIFFERENTIAL/PLATELET - Abnormal; Notable for the following components:      Result Value   WBC 13.4 (*)    Neutro Abs 7.8 (*)     Monocytes Absolute 1.4 (*)    Eosinophils Absolute 0.8 (*)    Abs Immature Granulocytes 0.12 (*)    All other components within normal limits  COMPREHENSIVE METABOLIC PANEL - Abnormal; Notable for the following components:   Calcium 8.5 (*)    All other components within normal limits  RESP PANEL BY RT-PCR (FLU A&B, COVID) [id number]    EKG EKG Interpretation  Date/Time:  Monday April 11 2021 02:12:40 EST Ventricular Rate:  87 PR Interval:  157 QRS Duration: 97 QT Interval:  376 QTC Calculation: 453 R Axis:   37 Text Interpretation: Sinus rhythm  Confirmed by Quintella Reichert [phone]) on 04/11/2021 2:37:14 AM  Radiology DG Chest Portable 1 View  Result Date: 04/11/2021 CLINICAL DATA:  Cough. EXAM: PORTABLE CHEST 1 VIEW COMPARISON:  Chest radiograph dated 04/05/2021. FINDINGS: The heart size and mediastinal contours are within normal limits. Both lungs are clear. The visualized skeletal structures are unremarkable. IMPRESSION: No active disease. Electronically Signed   By: Anner Crete M.D.   On: 04/11/2021 02:33    Procedures .Critical Care Performed by: Abigail Butts, PA-C Authorized by: Abigail Butts, PA-C   Critical care provider statement:    Critical care time (minutes):  45   Critical care time was exclusive of:  Separately billable procedures and treating other patients   Critical care was necessary to treat or prevent imminent or life-threatening deterioration of the following conditions:  Respiratory failure   Critical care was time spent personally by me on the following activities:  Development of treatment plan with patient or surrogate, discussions with consultants, evaluation of patient's response to treatment, examination of patient, ordering and review of laboratory studies, ordering and review of radiographic studies, ordering and performing treatments and interventions, pulse oximetry, re-evaluation of patient's condition and review of old charts    I assumed direction of critical care for this patient from another provider in my specialty: no     Care discussed with: admitting provider     Medications Ordered in ED Medications  albuterol (PROVENTIL,VENTOLIN) solution continuous neb (has no administration in time range)  albuterol (PROVENTIL) (2.5 MG/3ML) 0.083% nebulizer solution 5 mg (5 mg Nebulization Given 04/11/21 0304)  ipratropium (ATROVENT) nebulizer solution 0.5 mg (0.5 mg Nebulization Given 04/11/21 0321)  methylPREDNISolone sodium succinate (SOLU-MEDROL) 125 mg/2 mL injection 125 mg (125 mg Intravenous Given 04/11/21 0305)  albuterol (PROVENTIL) (2.5 MG/3ML) 0.083% nebulizer solution 5 mg (5 mg Nebulization Given 04/11/21 0422)  ipratropium (ATROVENT) nebulizer solution 0.5 mg (0.5 mg Nebulization Given 04/11/21 0435)  albuterol (PROVENTIL) (2.5 MG/3ML) 0.083% nebulizer solution 5 mg (5 mg Nebulization Given 04/11/21 0609)  ipratropium (ATROVENT) nebulizer solution 0.5 mg (0.5 mg Nebulization Given 04/11/21 0609)  magnesium sulfate IVPB 2 g 50 mL (2 g Intravenous New Bag/Given 04/11/21 0609)    ED Course  I have reviewed the triage vital signs and the nursing notes.  Pertinent labs & imaging results that were available during my care of the patient were reviewed by me and considered in my medical decision making (see chart for details).  Clinical Course as of 04/11/21 0710  Mon Apr 11, 2021  [id number] Patient continues to wheeze - additional treatments ordered [HM]  0519 Continues to have inspiratory and expiratory wheezing with retractions - 3rd albuterol ordered along with magnesium.  Pt will likely need admission  [HM]    Clinical Course User Index [HM] Reynard Christoffersen, Gwenlyn Perking   MDM Rules/Calculators/A&P                          Patient with asthma exacerbation.  She been seen numerous times in the last few days for this.  Will give albuterol, Solu-Medrol and reassess.  7:09 AM Patient has received albuterol and  Atrovent x3, Solu-Medrol and magnesium.  Continues with wheezing and increased work of breathing.  This is patient's third visit in the last 6 days for similar.  Given persistent symptoms and failure of outpatient therapy she will need admission.  Discussed with hospitalist Dr. Bridgett Larsson who will admit.     Final Clinical Impression(s) /  ED Diagnoses Final diagnoses:  Moderate persistent asthma with exacerbation    Rx / DC Orders ED Discharge Orders     None        Zane Samson, Gwenlyn Perking 04/11/21 0710    Quintella Reichert, MD 04/11/21 [id number]

## 2021-04-11 NOTE — TOC Initial Note (Signed)
Transition of Care [facility]) - Initial/Assessment Note   Patient Details  Name: Wendy Morgan MRN: [id number] Date of Birth: 1975-06-11  Transition of Care [facility]) CM/SW Contact:    Ewing Schlein, LCSW Phone Number: 04/11/2021, 3:29 PM  Clinical Narrative: Readmission checklist completed due to high readmission score. CSW spoke with patient to complete assessment. Per patient, she resides at home alone. Patient is independent with ADLs and transportation to medical appointments at baseline. Patient reported she is currently uninsured, but will be starting a new job soon and is able to afford her medications in the meantime. Patient sees an NP at [facility]. Patient does not have any DME or HH services as she is independent. TOC to follow for potential discharge needs.  Expected Discharge Plan: Home/Self Care Barriers to Discharge: Inadequate or no insurance, Continued Medical Work up  Patient Goals and CMS Choice Patient states their goals for this hospitalization and ongoing recovery are:: Return home Choice offered to / list presented to : NA  Expected Discharge Plan and Services Expected Discharge Plan: Home/Self Care In-house Referral: Clinical Social Work Post Acute Care Choice: NA Living arrangements for the past 2 months: Single Family Home             DME Arranged: N/A DME Agency: NA  Prior Living Arrangements/Services Living arrangements for the past 2 months: Single Family Home Lives with:: Self Patient language and need for interpreter reviewed:: Yes Do you feel safe going back to the place where you live?: Yes      Need for Family Participation in Patient Care: No (Comment) Care giver support system in place?: Yes (comment) Criminal Activity/Legal Involvement Pertinent to Current Situation/Hospitalization: No - Comment as needed  Activities of Daily Living Home Assistive Devices/Equipment: None ADL Screening (condition at time of  admission) Patient's cognitive ability adequate to safely complete daily activities?: Yes Is the patient deaf or have difficulty hearing?: No Does the patient have difficulty seeing, even when wearing glasses/contacts?: No Does the patient have difficulty concentrating, remembering, or making decisions?: No Patient able to express need for assistance with ADLs?: Yes Does the patient have difficulty dressing or bathing?: No Independently performs ADLs?: Yes (appropriate for developmental age) Communication: Independent Dressing (OT): Independent Grooming: Independent Feeding: Independent Bathing: Independent Toileting: Independent In/Out Bed: Independent Walks in Home: Independent Does the patient have difficulty walking or climbing stairs?: No Weakness of Legs: None Weakness of Arms/Hands: None  Emotional Assessment Appearance:: Appears stated age Attitude/Demeanor/Rapport: Engaged Affect (typically observed): Accepting Orientation: : Oriented to Self, Oriented to Place, Oriented to  Time, Oriented to Situation Alcohol / Substance Use: Not Applicable Psych Involvement: No (comment)  Admission diagnosis:  Moderate persistent asthma with exacerbation [J45.41] Asthma exacerbation [J45.901] Patient Active Problem List   Diagnosis Date Noted   Class 1 obesity 04/11/2021   Leukocytosis 04/11/2021   Acute recurrent maxillary sinusitis 07/25/2019   Allergic rhinitis with postnasal drip 07/22/2019   Acute recurrent pansinusitis 07/22/2019   Dental caries 07/22/2019   Asthma exacerbation 05/29/2018   PCP:  Claiborne Rigg, NP Pharmacy:   [facility] [address] [place] [address] Phone: [phone] Fax: [phone]  Readmission Risk Interventions Readmission Risk Prevention Plan 04/11/2021  Transportation Screening Complete  HRI or Home Care Consult Complete  Social Work Consult for Recovery Care Planning/Counseling Complete   Palliative Care Screening Not Applicable  Medication Review Oceanographer) Complete  Some recent data might be  hidden

## 2021-04-11 NOTE — ED Triage Notes (Signed)
Pt reports with shortness of breath and cough x 2 days ago. Pt states that her inhaler is not working. Pt had the flu 2-3 weeks ago.

## 2021-04-12 ENCOUNTER — Other Ambulatory Visit: Payer: Self-pay

## 2021-04-12 MED ORDER — PREDNISONE 20 MG PO TABS
40.0000 mg | ORAL_TABLET | Freq: Every day | ORAL | 0 refills | Status: AC
Start: 2021-04-13 — End: 2021-04-16
  Filled 2021-04-12: qty 6, 3d supply, fill #0

## 2021-04-12 MED ORDER — QVAR REDIHALER 40 MCG/ACT IN AERB
2.0000 | INHALATION_SPRAY | Freq: Two times a day (BID) | RESPIRATORY_TRACT | 1 refills | Status: AC
  Filled 2021-04-12: qty 10.6, 30d supply, fill #0
  Filled 2021-05-03: qty 10.6, fill #0
  Filled 2021-05-03: qty 10.6, 30d supply, fill #0

## 2021-04-12 NOTE — Discharge Summary (Signed)
Physician Discharge Summary  Wendy Morgan:[id number] DOB: 1975-08-19 DOA: 04/11/2021  PCP: Claiborne Rigg, NP  Admit date: 04/11/2021 Discharge date: 04/12/2021  Time spent: 32 minutes  Recommendations for Outpatient Follow-up:  Follow outpatient CBC/CMP Follow asthma symptoms outpatient - started on qvar, adjust regimen as needed   Discharge Diagnoses:  Principal Problem:   Asthma exacerbation Active Problems:   Class 1 obesity   Leukocytosis   Discharge Condition: stable  Diet recommendation: heart healthy  Filed Weights   04/11/21 0207  Weight: 90 kg    History of present illness:  45 yo with hx asthma, bronchiectasis who presents with an asthma exacerbation.  She's improved with steroids and nebs.  She'll be discharged on 12/27 with steroids, albuterol, and new controller med qvar.  See below for additional details  Hospital Course:  * Asthma exacerbation- (present on admission) Symptoms improved Will discharge home with her home albuterol.  Fill prednisone.  Start qvar for controller med. Follow with PCP outpatient  Class 1 obesity- (present on admission) noted  Leukocytosis- (present on admission) Noted, mild, follow   Procedures: none   Consultations: none  Discharge Exam: Vitals:   04/12/21 0750 04/12/21 1207  BP:  96/62  Pulse:  72  Resp:  16  Temp:  97.9 F (36.6 C)  SpO2: 98% 97%   Feeling better Eager for discharge  General: No acute distress. Cardiovascular: Heart sounds show Seyed Heffley regular rate, and rhythm. Lungs: Clear to auscultation bilaterally with good air movement. No rales, rhonchi or wheezes. Abdomen: Soft, nontender, nondistended  Neurological: Alert and oriented 3. Moves all extremities 4 . Cranial nerves II through XII grossly intact. Skin: Warm and dry. No rashes or lesions. Extremities: No clubbing or cyanosis. No edema. Discharge Instructions   Discharge Instructions     Call MD for:  difficulty  breathing, headache or visual disturbances   Complete by: As directed    Call MD for:  extreme fatigue   Complete by: As directed    Call MD for:  hives   Complete by: As directed    Call MD for:  persistant dizziness or light-headedness   Complete by: As directed    Call MD for:  persistant nausea and vomiting   Complete by: As directed    Call MD for:  redness, tenderness, or signs of infection (pain, swelling, redness, odor or green/yellow discharge around incision site)   Complete by: As directed    Call MD for:  severe uncontrolled pain   Complete by: As directed    Call MD for:  temperature >100.4   Complete by: As directed    Diet - low sodium heart healthy   Complete by: As directed    Discharge instructions   Complete by: As directed    You were seen for an asthma exacerbation.  We'll send you home with steroids, you albuterol as needed, and Swade Shonka new medicine (qvar - an inhaled corticosteroid which is Yashas Camilli controller medicine).  Please arrange follow up with your PCP within Kruze Atchley week.  Return for new, recurrent, or worsening symptoms.  Please ask your PCP to request records from this hospitalization so they know what was done and what the next steps will be.   Increase activity slowly   Complete by: As directed       Allergies as of 04/12/2021   No Known Allergies      Medication List     TAKE these medications    albuterol 108 (  90 Base) MCG/ACT inhaler Commonly known as: VENTOLIN HFA Inhale 1-2 puffs into the lungs every 6 (six) hours as needed for wheezing or shortness of breath. What changed: Another medication with the same name was removed. Continue taking this medication, and follow the directions you see here.   cetirizine 10 MG tablet Commonly known as: ZyrTEC Allergy Take 1 tablet (10 mg total) by mouth daily for 14 days.   fluticasone 50 MCG/ACT nasal spray Commonly known as: FLONASE Place 1 spray into both nostrils daily for 14 days.   predniSONE 20  MG tablet Commonly known as: DELTASONE Take 2 tablets (40 mg total) by mouth daily with breakfast for 3 days. Start taking on: April 13, 2021   Qvar RediHaler 40 MCG/ACT inhaler Generic drug: beclomethasone Inhale 2 puffs into the lungs 2 (two) times daily.       No Known Allergies    The results of significant diagnostics from this hospitalization (including imaging, microbiology, ancillary and laboratory) are listed below for reference.    Significant Diagnostic Studies: DG Chest 2 View  Result Date: 03/29/2021 CLINICAL DATA:  Shortness of breath with chills, body aches and cough. Worsening asthma for 3 days. EXAM: CHEST - 2 VIEW COMPARISON:  Radiographs 03/13/2021.  CT 07/25/2019. FINDINGS: The heart size and mediastinal contours are normal. The lungs are clear. There is no pleural effusion or pneumothorax. No acute osseous findings are identified. IMPRESSION: Stable chest.  No active cardiopulmonary process. Electronically Signed   By: Carey Bullocks M.D.   On: 03/29/2021 14:52   DG Chest Portable 1 View  Result Date: 04/11/2021 CLINICAL DATA:  Cough. EXAM: PORTABLE CHEST 1 VIEW COMPARISON:  Chest radiograph dated 04/05/2021. FINDINGS: The heart size and mediastinal contours are within normal limits. Both lungs are clear. The visualized skeletal structures are unremarkable. IMPRESSION: No active disease. Electronically Signed   By: Elgie Collard M.D.   On: 04/11/2021 02:33   DG Chest Portable 1 View  Result Date: 04/05/2021 CLINICAL DATA:  45 year old female with shortness of breath. EXAM: PORTABLE CHEST 1 VIEW COMPARISON:  Chest radiographs 03/29/2021 and earlier. FINDINGS: Portable AP semi upright view at 0648 hours. Lung volumes and mediastinal contours remain normal. Visualized tracheal air column is within normal limits. Allowing for portable technique the lungs are clear. No pneumothorax or pleural effusion. No osseous abnormality identified.  Negative visible bowel  gas. IMPRESSION: Negative portable chest. Electronically Signed   By: Odessa Fleming M.D.   On: 04/05/2021 07:15    Microbiology: Recent Results (from the past 240 hour(s))  Resp Panel by RT-PCR (Flu Valecia Beske&B, Covid) Nasopharyngeal Swab     Status: None   Collection Time: 04/11/21  6:09 AM   Specimen: Nasopharyngeal Swab; Nasopharyngeal(NP) swabs in vial transport medium  Result Value Ref Range Status   SARS Coronavirus 2 by RT PCR NEGATIVE NEGATIVE Final    Comment: (NOTE) SARS-CoV-2 target nucleic acids are NOT DETECTED.  The SARS-CoV-2 RNA is generally detectable in upper respiratory specimens during the acute phase of infection. The lowest concentration of SARS-CoV-2 viral copies this assay can detect is 138 copies/mL. Harjas Biggins negative result does not preclude SARS-Cov-2 infection and should not be used as the sole basis for treatment or other patient management decisions. Marshal Schrecengost negative result may occur with  improper specimen collection/handling, submission of specimen other than nasopharyngeal swab, presence of viral mutation(s) within the areas targeted by this assay, and inadequate number of viral copies(<138 copies/mL). Kimley Apsey negative result must be combined with clinical  observations, patient history, and epidemiological information. The expected result is Negative.  Fact Sheet for Patients:  BloggerCourse.com  Fact Sheet for Healthcare Providers:  SeriousBroker.it  This test is no t yet approved or cleared by the [place] FDA and  has been authorized for detection and/or diagnosis of SARS-CoV-2 by FDA under an Emergency Use Authorization (EUA). This EUA will remain  in effect (meaning this test can be used) for the duration of the COVID-19 declaration under Section 564(b)(1) of the Act, 21 U.S.C.section 360bbb-3(b)(1), unless the authorization is terminated  or revoked sooner.       Influenza Jakeya Gherardi by PCR NEGATIVE NEGATIVE Final    Influenza B by PCR NEGATIVE NEGATIVE Final    Comment: (NOTE) The Xpert Xpress SARS-CoV-2/FLU/RSV plus assay is intended as an aid in the diagnosis of influenza from Nasopharyngeal swab specimens and should not be used as Zoi Devine sole basis for treatment. Nasal washings and aspirates are unacceptable for Xpert Xpress SARS-CoV-2/FLU/RSV testing.  Fact Sheet for Patients: BloggerCourse.com  Fact Sheet for Healthcare Providers: SeriousBroker.it  This test is not yet approved or cleared by the [place] FDA and has been authorized for detection and/or diagnosis of SARS-CoV-2 by FDA under an Emergency Use Authorization (EUA). This EUA will remain in effect (meaning this test can be used) for the duration of the COVID-19 declaration under Section 564(b)(1) of the Act, 21 U.S.C. section 360bbb-3(b)(1), unless the authorization is terminated or revoked.  Performed at [facility], [address] [place], [place] [address]      Labs: Basic Metabolic Panel: Recent Labs  Lab 04/11/21 0207  NA 139  K 3.6  CL 104  CO2 27  GLUCOSE 89  BUN 12  CREATININE 0.93  CALCIUM 8.5*   Liver Function Tests: Recent Labs  Lab 04/11/21 0207  AST 18  ALT 14  ALKPHOS 59  BILITOT 0.5  PROT 6.9  ALBUMIN 3.5   No results for input(s): LIPASE, AMYLASE in the last 168 hours. No results for input(s): AMMONIA in the last 168 hours. CBC: Recent Labs  Lab 04/11/21 0207  WBC 13.4*  NEUTROABS 7.8*  HGB 13.4  HCT 41.1  MCV 88.4  PLT 379   Cardiac Enzymes: No results for input(s): CKTOTAL, CKMB, CKMBINDEX, TROPONINI in the last 168 hours. BNP: BNP (last 3 results) No results for input(s): BNP in the last 8760 hours.  ProBNP (last 3 results) No results for input(s): PROBNP in the last 8760 hours.  CBG: No results for input(s): GLUCAP in the last 168 hours.     Signed:  Lacretia Nicks MD.  [facility] 04/12/2021, 12:24 PM

## 2021-04-12 NOTE — Hospital Course (Signed)
45 yo with hx asthma, bronchiectasis who presents with an asthma exacerbation.  She's improved with steroids and nebs.  She'll be discharged on 12/27 with steroids, albuterol, and new controller med qvar.  See below for additional details

## 2021-04-12 NOTE — Assessment & Plan Note (Signed)
Noted, mild, follow

## 2021-04-12 NOTE — Assessment & Plan Note (Signed)
Symptoms improved Will discharge home with her home albuterol.  Fill prednisone.  Start qvar for controller med. Follow with PCP outpatient

## 2021-04-12 NOTE — TOC Transition Note (Signed)
Transition of Care [facility]) - CM/SW Discharge Note   Patient Details  Name: Wendy Morgan MRN: [id number] Date of Birth: 23-Jun-1975  Transition of Care [facility]) CM/SW Contact:  Golda Acre, RN Phone Number: 04/12/2021, 12:37 PM   Clinical Narrative:    Dcd to home with no toc needs    Final next level of care: Home/Self Care Barriers to Discharge: Barriers Resolved   Patient Goals and CMS Choice Patient states their goals for this hospitalization and ongoing recovery are:: Return home   Choice offered to / list presented to : NA  Discharge Placement                       Discharge Plan and Services In-house Referral: Clinical Social Work   Post Acute Care Choice: NA          DME Arranged: N/A DME Agency: NA                  Social Determinants of Health (SDOH) Interventions     Readmission Risk Interventions Readmission Risk Prevention Plan 04/11/2021  Transportation Screening Complete  HRI or Home Care Consult Complete  Social Work Consult for Recovery Care Planning/Counseling Complete  Palliative Care Screening Not Applicable  Medication Review [employer]) Complete  Some recent data might be hidden

## 2021-04-12 NOTE — Progress Notes (Signed)
Pt discharged home. AVS printed and educational teaching completed with teach back method. PIV removed. Pt has all belongings. No further questions at this time.

## 2021-04-12 NOTE — Assessment & Plan Note (Signed)
noted 

## 2021-04-13 ENCOUNTER — Telehealth: Payer: Self-pay

## 2021-04-13 NOTE — Telephone Encounter (Signed)
Transition Care Management Follow-up Telephone Call Date of discharge and from where: 04/12/2021, [facility] How have you been since you were released from the hospital? She said she is doing better.  Any questions or concerns? No  Items Reviewed: Did the pt receive and understand the discharge instructions provided? Yes  Medications obtained and verified? No  - she said she still needs to pick up the prednisone and Qvar today; but she has the other medications.  She confirmed that she has been working with [facility] Patient Assistance Program  Other? No  Any new allergies since your discharge? No  Do you have support at home? Yes   Home Care and Equipment/Supplies: Were home health services ordered? no If so, what is the name of the agency? N/a  Has the agency set up a time to come to the patient's home? not applicable Were any new equipment or medical supplies ordered?  No What is the name of the medical supply agency? N/a Were you able to get the supplies/equipment? not applicable Do you have any questions related to the use of the equipment or supplies? No  Functional Questionnaire: (I = Independent and D = Dependent) ADLs: independent.    Follow up appointments reviewed:  PCP Hospital f/u appt confirmed? Yes  Scheduled to see Bertram Denver, NP on 05/04/2021 @ 1050. Specialist Hospital f/u appt confirmed?  None scheduled at this time Are transportation arrangements needed? No  If their condition worsens, is the pt aware to call PCP or go to the Emergency Dept.? Yes Was the patient provided with contact information for the PCP's office or ED? Yes Was to pt encouraged to call back with questions or concerns? Yes

## 2021-04-15 ENCOUNTER — Other Ambulatory Visit: Payer: Self-pay

## 2021-04-21 ENCOUNTER — Emergency Department (HOSPITAL_COMMUNITY)
Admission: EM | Admit: 2021-04-21 | Discharge: 2021-04-21 | Disposition: A | Discharge status: Home / Self Care | Payer: Self-pay | Attending: Medical | Admitting: Medical

## 2021-04-21 ENCOUNTER — Emergency Department (HOSPITAL_COMMUNITY): Payer: Self-pay

## 2021-04-21 ENCOUNTER — Encounter (HOSPITAL_COMMUNITY): Payer: Self-pay

## 2021-04-21 ENCOUNTER — Other Ambulatory Visit (HOSPITAL_COMMUNITY): Payer: Self-pay

## 2021-04-21 ENCOUNTER — Other Ambulatory Visit: Payer: Self-pay

## 2021-04-21 DIAGNOSIS — J4541 Moderate persistent asthma with (acute) exacerbation: Secondary | ICD-10-CM | POA: Insufficient documentation

## 2021-04-21 DIAGNOSIS — Z7951 Long term (current) use of inhaled steroids: Secondary | ICD-10-CM | POA: Insufficient documentation

## 2021-04-21 LAB — CBC WITH DIFFERENTIAL/PLATELET
Abs Immature Granulocytes: 0.23 10*3/uL — ABNORMAL HIGH (ref 0.00–0.07)
Basophils Absolute: 0.1 10*3/uL (ref 0.0–0.1)
Basophils Relative: 1 %
Eosinophils Absolute: 1 10*3/uL — ABNORMAL HIGH (ref 0.0–0.5)
Eosinophils Relative: 7 %
HCT: 45.5 % (ref 36.0–46.0)
Hemoglobin: 14.5 g/dL (ref 12.0–15.0)
Immature Granulocytes: 2 %
Lymphocytes Relative: 26 %
Lymphs Abs: 4 10*3/uL (ref 0.7–4.0)
MCH: 28.3 pg (ref 26.0–34.0)
MCHC: 31.9 g/dL (ref 30.0–36.0)
MCV: 88.9 fL (ref 80.0–100.0)
Monocytes Absolute: 1.4 10*3/uL — ABNORMAL HIGH (ref 0.1–1.0)
Monocytes Relative: 9 %
Neutro Abs: 8.5 10*3/uL — ABNORMAL HIGH (ref 1.7–7.7)
Neutrophils Relative %: 55 %
Platelets: 407 10*3/uL — ABNORMAL HIGH (ref 150–400)
RBC: 5.12 MIL/uL — ABNORMAL HIGH (ref 3.87–5.11)
RDW: 15.1 % (ref 11.5–15.5)
WBC: 15.3 10*3/uL — ABNORMAL HIGH (ref 4.0–10.5)
nRBC: 0 % (ref 0.0–0.2)

## 2021-04-21 LAB — BASIC METABOLIC PANEL
Anion gap: 6 (ref 5–15)
BUN: 13 mg/dL (ref 6–20)
CO2: 22 mmol/L (ref 22–32)
Calcium: 8.5 mg/dL — ABNORMAL LOW (ref 8.9–10.3)
Chloride: 106 mmol/L (ref 98–111)
Creatinine, Ser: 0.82 mg/dL (ref 0.44–1.00)
GFR, Estimated: >60 mL/min (ref >60)
Glucose, Bld: 89 mg/dL (ref 70–99)
Potassium: 3.8 mmol/L (ref 3.5–5.1)
Sodium: 134 mmol/L — ABNORMAL LOW (ref 135–145)

## 2021-04-21 MED ORDER — PREDNISONE 20 MG PO TABS
60.0000 mg | ORAL_TABLET | Freq: Once | ORAL | Status: AC
  Administered 2021-04-21: 60 mg via ORAL
  Filled 2021-04-21: qty 3

## 2021-04-21 MED ORDER — PREDNISONE 20 MG PO TABS
40.0000 mg | ORAL_TABLET | Freq: Every day | ORAL | 0 refills | Status: AC
  Filled 2021-04-21: qty 10, 5d supply, fill #0

## 2021-04-21 MED ORDER — ALBUTEROL SULFATE (2.5 MG/3ML) 0.083% IN NEBU
10.0000 mg/h | INHALATION_SOLUTION | RESPIRATORY_TRACT | Status: DC
  Administered 2021-04-21: 10 mg/h via RESPIRATORY_TRACT
  Filled 2021-04-21: qty 12

## 2021-04-21 MED ORDER — IPRATROPIUM-ALBUTEROL 0.5-2.5 (3) MG/3ML IN SOLN
3.0000 mL | Freq: Once | RESPIRATORY_TRACT | Status: AC
  Administered 2021-04-21: 3 mL via RESPIRATORY_TRACT
  Filled 2021-04-21: qty 3

## 2021-04-21 NOTE — ED Provider Notes (Signed)
[facility] EMERGENCY DEPARTMENT Provider Note   CSN: [id number] Arrival date & time: 04/21/21  0207     History  Chief Complaint  Patient presents with   Shortness of Breath    Wendy Morgan is a 46 y.o. female who presents to the ED today with complaint of asthma exacerbation for the past 2 days.  Patient complains of chest tightness, shortness of breath, wheezing.  She was recently admitted to the hospital.  States that she improved however 2 days ago began having worsening symptoms.  Has been using her albuterol inhaler multiple times throughout the day without relief. No other complaints at this time.   The history is provided by the patient and medical records.      Home Medications Prior to Admission medications   Medication Sig Start Date End Date Taking? Authorizing Provider  predniSONE (DELTASONE) 20 MG tablet Take 2 tablets (40 mg total) by mouth daily for 5 days. 04/21/21 04/26/21 Yes Jossiah Smoak, PA-C  albuterol (VENTOLIN HFA) 108 (90 Base) MCG/ACT inhaler Inhale 1-2 puffs into the lungs every 6 (six) hours as needed for wheezing or shortness of breath. 03/13/21   Sloan Leiter, DO  beclomethasone (QVAR REDIHALER) 40 MCG/ACT inhaler Inhale 2 puffs into the lungs 2 (two) times daily. 04/12/21 05/12/21  Zigmund Daniel., MD  cetirizine (ZYRTEC ALLERGY) 10 MG tablet Take 1 tablet (10 mg total) by mouth daily for 14 days. Patient not taking: Reported on 04/11/2021 03/13/21 04/11/22  Tanda Rockers A, DO  fluticasone [facility]) 50 MCG/ACT nasal spray Place 1 spray into both nostrils daily for 14 days. 03/13/21 04/11/22  Sloan Leiter, DO      Allergies    Patient has no known allergies.    Review of Systems   Review of Systems  Constitutional:  Negative for chills and fever.  Respiratory:  Positive for cough, chest tightness, shortness of breath and wheezing.   All other systems reviewed and are negative.  Physical Exam Updated Vital  Signs BP 129/90 (BP Location: Right Arm)    Pulse 84    Temp 98.1 F (36.7 C) (Oral)    Resp (!) 23    Ht 5\' 4"  (1.626 m)    Wt 90.7 kg    LMP 03/28/2021 (Approximate)    SpO2 100%    BMI 34.33 kg/m  Physical Exam Vitals and nursing note reviewed.  Constitutional:      Appearance: She is not ill-appearing or diaphoretic.  HENT:     Head: Normocephalic and atraumatic.  Eyes:     Conjunctiva/sclera: Conjunctivae normal.  Cardiovascular:     Rate and Rhythm: Normal rate and regular rhythm.     Pulses: Normal pulses.  Pulmonary:     Effort: Tachypnea present.     Breath sounds: Decreased breath sounds and wheezing present.     Comments: Audible wheezing Abdominal:     Palpations: Abdomen is soft.     Tenderness: There is no abdominal tenderness.  Musculoskeletal:     Cervical back: Neck supple.  Skin:    General: Skin is warm and dry.  Neurological:     Mental Status: She is alert.    ED Results / Procedures / Treatments   Labs (all labs ordered are listed, but only abnormal results are displayed) Labs Reviewed  BASIC METABOLIC PANEL - Abnormal; Notable for the following components:      Result Value   Sodium 134 (*)    Calcium 8.5 (*)  All other components within normal limits  CBC WITH DIFFERENTIAL/PLATELET - Abnormal; Notable for the following components:   WBC 15.3 (*)    RBC 5.12 (*)    Platelets 407 (*)    Neutro Abs 8.5 (*)    Monocytes Absolute 1.4 (*)    Eosinophils Absolute 1.0 (*)    Abs Immature Granulocytes 0.23 (*)    All other components within normal limits    EKG None  Radiology DG Chest 2 View  Result Date: 04/21/2021 CLINICAL DATA:  Asthma exacerbation, shortness of breath. EXAM: CHEST - 2 VIEW COMPARISON:  04/11/2021. FINDINGS: The heart size and mediastinal contours are within normal limits. No consolidation, effusion, or pneumothorax. No acute osseous abnormality. IMPRESSION: No active cardiopulmonary disease. Electronically Signed   By:  Thornell Sartorius M.D.   On: 04/21/2021 03:31    Procedures Procedures    Medications Ordered in ED Medications  albuterol (PROVENTIL) (2.5 MG/3ML) 0.083% nebulizer solution (10 mg/hr Nebulization New Bag/Given 04/21/21 0311)  ipratropium-albuterol (DUONEB) 0.5-2.5 (3) MG/3ML nebulizer solution 3 mL (3 mLs Nebulization Given 04/21/21 0230)  predniSONE (DELTASONE) tablet 60 mg (60 mg Oral Given 04/21/21 0230)    ED Course/ Medical Decision Making/ A&P                           Medical Decision Making 46 year old female who presents to the ED today for asthma exacerbation x2 days.  Recent admission for same.  On arrival to the ED today patient is afebrile and nontachycardic.  She is mildly tachypneic.  She is breathing 23 times per minute.  She has audible wheezing in the room without auscultation.  She was initially medically screened by myself in triage.  Oral prednisone and DuoNeb provided without much relief.  Continuous neb ordered and provided.  On reevaluation patient resting more comfortably and no longer has audible wheezing.  On auscultation she continues to have mild and expiratory wheezing however states that she feels significantly improved.  She feels like she is stable to go home and I agree with plan.  Will discharge home with course of steroids.  Patient instructed to follow-up with her PCP for further evaluation and to return to the ED for any new/worsening symptoms.  She is in agreement plan and stable for discharge.  Amount and/or Complexity of Data Reviewed Labs: ordered.    Details: CBC with mild leukocytosis 15,300. Slightly elevated from a couple of days ago.  BMP without electrolyte abnormalities Radiology: ordered and independent interpretation performed.    Details: CXR clear without signs of infection          Final Clinical Impression(s) / ED Diagnoses Final diagnoses:  Moderate persistent asthma with exacerbation    Rx / DC Orders ED Discharge Orders           Ordered    predniSONE (DELTASONE) 20 MG tablet  Daily        04/21/21 0429             Discharge Instructions      Please pick up prescription and take as prescribed Continue using your inhaler as needed  Follow up with your PCP for further evaluation  Return to the ED for any new/worsening symptoms       Tanda Rockers, PA-C 04/21/21 5631    Shon Baton, MD 04/25/21 [phone]

## 2021-04-21 NOTE — Discharge Instructions (Signed)
Please pick up prescription and take as prescribed Continue using your inhaler as needed  Follow up with your PCP for further evaluation  Return to the ED for any new/worsening symptoms

## 2021-04-21 NOTE — ED Triage Notes (Signed)
Pt presents to the ED from home with complaints of wheezing, asthma problems onset 2 days ago. Recently admitted to [facility] for same.

## 2021-04-21 NOTE — ED Provider Triage Note (Signed)
Emergency Medicine Provider Triage Evaluation Note  Wendy Morgan , a 46 y.o. female  was evaluated in triage.  Pt complains of asthma exacerbation for the past 2 days.  Patient complains of chest tightness, shortness of breath, wheezing.  She was recently admitted to the hospital.  States that she improved however 2 days ago began having worsening symptoms.  Has been using her albuterol inhaler multiple times throughout the day without relief.  No other complaints at this time.  Review of Systems  Positive: + chest tightness, wheezing, SOB Negative: - fevers, chills  Physical Exam  BP 129/90 (BP Location: Right Arm)    Pulse 84    Temp 98.1 F (36.7 C) (Oral)    Resp (!) 23    Ht 5\' 4"  (1.626 m)    Wt 90.7 kg    LMP 03/28/2021 (Approximate)    SpO2 97%    BMI 34.33 kg/m  Gen:   Awake, no distress   Resp:  Tachypneic. Audible wheezing in room.  MSK:   Moves extremities without difficulty  Other:    Medical Decision Making  Medically screening exam initiated at 2:27 AM.  Appropriate orders placed.  14/03/2021 [name] was informed that the remainder of the evaluation will be completed by another provider, this initial triage assessment does not replace that evaluation, and the importance of remaining in the ED until their evaluation is complete.     Brett Fairy, PA-C 04/21/21 [phone]

## 2021-04-22 ENCOUNTER — Other Ambulatory Visit (HOSPITAL_COMMUNITY): Payer: Self-pay

## 2021-05-03 ENCOUNTER — Observation Stay (HOSPITAL_COMMUNITY)
Admission: EM | Admit: 2021-05-03 | Discharge: 2021-05-05 | Disposition: A | Discharge status: Home / Self Care | Payer: Self-pay | Attending: Internal Medicine | Admitting: Internal Medicine

## 2021-05-03 ENCOUNTER — Other Ambulatory Visit: Payer: Self-pay

## 2021-05-03 ENCOUNTER — Other Ambulatory Visit (HOSPITAL_COMMUNITY): Payer: Self-pay

## 2021-05-03 DIAGNOSIS — Z79899 Other long term (current) drug therapy: Secondary | ICD-10-CM | POA: Insufficient documentation

## 2021-05-03 DIAGNOSIS — R0982 Postnasal drip: Secondary | ICD-10-CM | POA: Diagnosis present

## 2021-05-03 DIAGNOSIS — E669 Obesity, unspecified: Secondary | ICD-10-CM | POA: Diagnosis present

## 2021-05-03 DIAGNOSIS — J4541 Moderate persistent asthma with (acute) exacerbation: Principal | ICD-10-CM | POA: Insufficient documentation

## 2021-05-03 DIAGNOSIS — Z87891 Personal history of nicotine dependence: Secondary | ICD-10-CM | POA: Insufficient documentation

## 2021-05-03 DIAGNOSIS — Z20822 Contact with and (suspected) exposure to covid-19: Secondary | ICD-10-CM | POA: Insufficient documentation

## 2021-05-03 DIAGNOSIS — J309 Allergic rhinitis, unspecified: Secondary | ICD-10-CM | POA: Diagnosis present

## 2021-05-03 NOTE — ED Triage Notes (Signed)
Pt has been using asthma rescue inhaler for 3-4 days with no help. Hx of asthma and just got off steriods that were px by our EDP 5 days ago. She is alert and ambulatory. SOB with exertion. RR 20

## 2021-05-04 ENCOUNTER — Emergency Department (HOSPITAL_COMMUNITY): Payer: Self-pay

## 2021-05-04 ENCOUNTER — Ambulatory Visit: Payer: Self-pay | Attending: Nurse Practitioner | Admitting: Nurse Practitioner

## 2021-05-04 ENCOUNTER — Other Ambulatory Visit: Payer: Self-pay

## 2021-05-04 ENCOUNTER — Encounter (HOSPITAL_COMMUNITY): Payer: Self-pay | Admitting: Family Medicine

## 2021-05-04 DIAGNOSIS — J309 Allergic rhinitis, unspecified: Secondary | ICD-10-CM

## 2021-05-04 DIAGNOSIS — J4541 Moderate persistent asthma with (acute) exacerbation: Secondary | ICD-10-CM | POA: Diagnosis present

## 2021-05-04 DIAGNOSIS — E669 Obesity, unspecified: Secondary | ICD-10-CM

## 2021-05-04 DIAGNOSIS — R0982 Postnasal drip: Secondary | ICD-10-CM

## 2021-05-04 LAB — BASIC METABOLIC PANEL
Anion gap: 6 (ref 5–15)
BUN: 8 mg/dL (ref 6–20)
CO2: 27 mmol/L (ref 22–32)
Calcium: 8.6 mg/dL — ABNORMAL LOW (ref 8.9–10.3)
Chloride: 104 mmol/L (ref 98–111)
Creatinine, Ser: 1.05 mg/dL — ABNORMAL HIGH (ref 0.44–1.00)
GFR, Estimated: >60 mL/min (ref >60)
Glucose, Bld: 85 mg/dL (ref 70–99)
Potassium: 3.6 mmol/L (ref 3.5–5.1)
Sodium: 137 mmol/L (ref 135–145)

## 2021-05-04 LAB — CBC WITH DIFFERENTIAL/PLATELET
Abs Immature Granulocytes: 0.08 10*3/uL — ABNORMAL HIGH (ref 0.00–0.07)
Basophils Absolute: 0.1 10*3/uL (ref 0.0–0.1)
Basophils Relative: 1 %
Eosinophils Absolute: 1 10*3/uL — ABNORMAL HIGH (ref 0.0–0.5)
Eosinophils Relative: 7 %
HCT: 42.6 % (ref 36.0–46.0)
Hemoglobin: 14.1 g/dL (ref 12.0–15.0)
Immature Granulocytes: 1 %
Lymphocytes Relative: 27 %
Lymphs Abs: 3.8 10*3/uL (ref 0.7–4.0)
MCH: 29.2 pg (ref 26.0–34.0)
MCHC: 33.1 g/dL (ref 30.0–36.0)
MCV: 88.2 fL (ref 80.0–100.0)
Monocytes Absolute: 1.4 10*3/uL — ABNORMAL HIGH (ref 0.1–1.0)
Monocytes Relative: 11 %
Neutro Abs: 7.4 10*3/uL (ref 1.7–7.7)
Neutrophils Relative %: 53 %
Platelets: 336 10*3/uL (ref 150–400)
RBC: 4.83 MIL/uL (ref 3.87–5.11)
RDW: 14.6 % (ref 11.5–15.5)
WBC: 13.8 10*3/uL — ABNORMAL HIGH (ref 4.0–10.5)
nRBC: 0 % (ref 0.0–0.2)

## 2021-05-04 LAB — I-STAT BETA HCG BLOOD, ED (MC, WL, AP ONLY): I-stat hCG, quantitative: <5 m[IU]/mL (ref <5)

## 2021-05-04 LAB — RESP PANEL BY RT-PCR (FLU A&B, COVID) ARPGX2
Influenza A by PCR: NEGATIVE
Influenza B by PCR: NEGATIVE
SARS Coronavirus 2 by RT PCR: NEGATIVE

## 2021-05-04 MED ORDER — METHYLPREDNISOLONE SODIUM SUCC 125 MG IJ SOLR
125.0000 mg | Freq: Once | INTRAMUSCULAR | Status: AC
  Administered 2021-05-04: 125 mg via INTRAVENOUS
  Filled 2021-05-04: qty 2

## 2021-05-04 MED ORDER — ACETAMINOPHEN 325 MG PO TABS: 650.0000 mg | ORAL_TABLET | Freq: Four times a day (QID) | ORAL | Status: DC | PRN

## 2021-05-04 MED ORDER — FLUTICASONE PROPIONATE 50 MCG/ACT NA SUSP: 1.0000 | Freq: Every day | NASAL | Status: DC

## 2021-05-04 MED ORDER — ENOXAPARIN SODIUM 40 MG/0.4ML IJ SOSY
40.0000 mg | PREFILLED_SYRINGE | INTRAMUSCULAR | Status: DC
  Administered 2021-05-04: 40 mg via SUBCUTANEOUS
  Filled 2021-05-04: qty 0.4

## 2021-05-04 MED ORDER — ACETAMINOPHEN 650 MG RE SUPP: 650.0000 mg | Freq: Four times a day (QID) | RECTAL | Status: DC | PRN

## 2021-05-04 MED ORDER — PREDNISONE 20 MG PO TABS
40.0000 mg | ORAL_TABLET | Freq: Every day | ORAL | Status: DC
  Administered 2021-05-04 – 2021-05-05 (×2): 40 mg via ORAL
  Filled 2021-05-04 (×2): qty 2

## 2021-05-04 MED ORDER — LORATADINE 10 MG PO TABS
10.0000 mg | ORAL_TABLET | Freq: Every day | ORAL | Status: DC
  Administered 2021-05-04 – 2021-05-05 (×2): 10 mg via ORAL
  Filled 2021-05-04 (×2): qty 1

## 2021-05-04 MED ORDER — IPRATROPIUM-ALBUTEROL 0.5-2.5 (3) MG/3ML IN SOLN
3.0000 mL | Freq: Once | RESPIRATORY_TRACT | Status: AC
  Administered 2021-05-04: 3 mL via RESPIRATORY_TRACT
  Filled 2021-05-04: qty 3

## 2021-05-04 MED ORDER — ALBUTEROL SULFATE (2.5 MG/3ML) 0.083% IN NEBU
2.5000 mg | INHALATION_SOLUTION | Freq: Four times a day (QID) | RESPIRATORY_TRACT | Status: DC
  Administered 2021-05-04 – 2021-05-05 (×4): 2.5 mg via RESPIRATORY_TRACT
  Filled 2021-05-04 (×4): qty 3

## 2021-05-04 MED ORDER — MAGNESIUM SULFATE 2 GM/50ML IV SOLN
2.0000 g | Freq: Once | INTRAVENOUS | Status: AC
  Administered 2021-05-04: 2 g via INTRAVENOUS
  Filled 2021-05-04: qty 50

## 2021-05-04 MED ORDER — ALBUTEROL SULFATE (2.5 MG/3ML) 0.083% IN NEBU: 2.5000 mg | INHALATION_SOLUTION | RESPIRATORY_TRACT | Status: DC | PRN

## 2021-05-04 MED ORDER — ALBUTEROL SULFATE (2.5 MG/3ML) 0.083% IN NEBU
10.0000 mg | INHALATION_SOLUTION | Freq: Once | RESPIRATORY_TRACT | Status: AC
Start: 2021-05-04 — End: 2021-05-04
  Administered 2021-05-04: 10 mg via RESPIRATORY_TRACT
  Filled 2021-05-04: qty 12

## 2021-05-04 NOTE — ED Notes (Signed)
Ambulated to bathroom. Gait steady. No dyspnea noted. No signs of distress.

## 2021-05-04 NOTE — Assessment & Plan Note (Signed)
BMI 34 

## 2021-05-04 NOTE — ED Provider Notes (Signed)
[facility] EMERGENCY DEPARTMENT Provider Note   CSN: [id number] Arrival date & time: 05/03/21  2021     History  Chief Complaint  Patient presents with   Asthma    Wendy Morgan is a 46 y.o. female with history of asthma with recurrent exacerbation as well as bronchiectasis who presents today with worsening shortness of breath over the last 3 to 4 days.  Recently completed  course of steroids that were prescribed by EDP recently.  Patient endorses chest tightness, shortness of breath, inspiratory and aspiratory wheezing and sensation that she may pass out with any sort of exertion.  Upon chart review patient has had 5 emergency department visits for asthma exacerbation in the last month, most recently requiring admission on 04/11/2021 for persistent asthma with exacerbation.  She is on beclomethasone inhaler daily with as needed albuterol but is not on any other oral medication for her moderate persistent asthma.  I personally read this patient's medical records.  In addition to that listed above she also has history of obesity.  She has never required intubation for her asthma in the past.  HPI     Home Medications Prior to Admission medications   Medication Sig Start Date End Date Taking? Authorizing Provider  albuterol (VENTOLIN HFA) 108 (90 Base) MCG/ACT inhaler Inhale 1-2 puffs into the lungs every 6 (six) hours as needed for wheezing or shortness of breath. 03/13/21   Sloan Leiter, DO  beclomethasone (QVAR REDIHALER) 40 MCG/ACT inhaler Inhale 2 puffs into the lungs 2 (two) times daily. 04/12/21 06/02/21  Zigmund Daniel., MD  cetirizine (ZYRTEC ALLERGY) 10 MG tablet Take 1 tablet (10 mg total) by mouth daily for 14 days. Patient not taking: Reported on 04/11/2021 03/13/21 04/11/22  Tanda Rockers A, DO  fluticasone [facility]) 50 MCG/ACT nasal spray Place 1 spray into both nostrils daily for 14 days. 03/13/21 04/11/22  Sloan Leiter, DO       Allergies    Patient has no known allergies.    Review of Systems   Review of Systems  Constitutional:  Positive for fatigue. Negative for activity change, appetite change, chills and fever.  HENT: Negative.    Eyes: Negative.   Respiratory:  Positive for cough, chest tightness, shortness of breath and wheezing.   Cardiovascular:  Positive for palpitations. Negative for chest pain and leg swelling.  Gastrointestinal: Negative.   Genitourinary: Negative.   Musculoskeletal: Negative.   Skin: Negative.   Neurological: Negative.   Hematological: Negative.    Physical Exam Updated Vital Signs BP 112/66    Pulse 71    Temp 97.9 F (36.6 C) (Oral)    Resp 20    SpO2 100%  Physical Exam Vitals and nursing note reviewed.  Constitutional:      Appearance: She is not toxic-appearing.  HENT:     Head: Normocephalic and atraumatic.     Nose: Nose normal.     Mouth/Throat:     Mouth: Mucous membranes are moist.     Pharynx: Oropharynx is clear. Uvula midline. No oropharyngeal exudate or posterior oropharyngeal erythema.     Tonsils: No tonsillar exudate.  Eyes:     General: Lids are normal. Vision grossly intact.        Right eye: No discharge.        Left eye: No discharge.     Extraocular Movements: Extraocular movements intact.     Conjunctiva/sclera: Conjunctivae normal.     Pupils: Pupils are  equal, round, and reactive to light.  Neck:     Trachea: Trachea and phonation normal.  Cardiovascular:     Rate and Rhythm: Normal rate and regular rhythm.     Pulses: Normal pulses.     Heart sounds: Normal heart sounds. No murmur heard. Pulmonary:     Effort: Tachypnea, accessory muscle usage and prolonged expiration present. No respiratory distress or retractions.     Breath sounds: Decreased air movement present. Examination of the right-upper field reveals wheezing. Examination of the left-upper field reveals wheezing. Examination of the right-middle field reveals wheezing.  Examination of the left-middle field reveals wheezing. Examination of the right-lower field reveals wheezing. Examination of the left-lower field reveals wheezing. Wheezing present. No rales.     Comments: Prolonged expiratory phase with expiratory wheezing with pause.  Accessory muscle use, no retractions.  No acute respiratory distress Chest:     Chest wall: No mass, lacerations, deformity, swelling, tenderness, crepitus or edema.  Abdominal:     General: Bowel sounds are normal. There is no distension.     Palpations: Abdomen is soft.     Tenderness: There is no abdominal tenderness. There is no guarding or rebound.  Musculoskeletal:        General: No deformity.     Cervical back: Normal range of motion and neck supple. No edema, rigidity or crepitus. No pain with movement, spinous process tenderness or muscular tenderness.     Right lower leg: No edema.     Left lower leg: No edema.  Lymphadenopathy:     Cervical: No cervical adenopathy.  Skin:    General: Skin is warm and dry.     Capillary Refill: Capillary refill takes less than 2 seconds.     Findings: No rash.  Neurological:     General: No focal deficit present.     Mental Status: She is alert and oriented to person, place, and time. Mental status is at baseline.     GCS: GCS eye subscore is 4. GCS verbal subscore is 5. GCS motor subscore is 6.     Gait: Gait is intact.  Psychiatric:        Mood and Affect: Mood normal.    ED Results / Procedures / Treatments   Labs (all labs ordered are listed, but only abnormal results are displayed) Labs Reviewed  CBC WITH DIFFERENTIAL/PLATELET - Abnormal; Notable for the following components:      Result Value   WBC 13.8 (*)    Monocytes Absolute 1.4 (*)    Eosinophils Absolute 1.0 (*)    Abs Immature Granulocytes 0.08 (*)    All other components within normal limits  BASIC METABOLIC PANEL - Abnormal; Notable for the following components:   Creatinine, Ser 1.05 (*)    Calcium  8.6 (*)    All other components within normal limits  RESP PANEL BY RT-PCR (FLU A&B, COVID) ARPGX2  I-STAT BETA HCG BLOOD, ED ([facility], [facility], [facility] ONLY)    EKG EKG: normal sinus rhythm, no STEMI.   Radiology DG Chest 2 View  Result Date: 05/04/2021 CLINICAL DATA:  Shortness of breath. EXAM: CHEST - 2 VIEW COMPARISON:  Chest radiograph 04/21/2021. FINDINGS: The heart size and mediastinal contours are within normal limits. Both lungs are clear. The visualized skeletal structures are unremarkable. IMPRESSION: No active cardiopulmonary disease. Electronically Signed   By: Elgie CollardArash  Radparvar M.D.   On: 05/04/2021 01:49    Procedures Procedures    Medications Ordered in ED Medications  magnesium sulfate IVPB 2 g 50 mL (has no administration in time range)  ipratropium-albuterol (DUONEB) 0.5-2.5 (3) MG/3ML nebulizer solution 3 mL (3 mLs Nebulization Given 05/04/21 0048)  ipratropium-albuterol (DUONEB) 0.5-2.5 (3) MG/3ML nebulizer solution 3 mL (3 mLs Nebulization Given 05/04/21 0214)  albuterol (PROVENTIL) (2.5 MG/3ML) 0.083% nebulizer solution 10 mg (10 mg Nebulization Given 05/04/21 0536)  methylPREDNISolone sodium succinate (SOLU-MEDROL) 125 mg/2 mL injection 125 mg (125 mg Intravenous Given 05/04/21 0538)    ED Course/ Medical Decision Making/ A&P Clinical Course as of 05/04/21 0746  Wed May 04, 2021  0724 Consult to hospitalist Dr. Maryfrances Bunnell, who spoke with my attending physician. He is agreeable to seeing this patient and admitting her to his service. I appreciate his collaboration in the care of this patient.  [RS]    Clinical Course User Index [RS] Jupiter Boys, Eugene Gavia, PA-C                           Medical Decision Making 46 year old female with recurrent episodes of asthma exacerbation over the last month who presents with concern for shortness of breath today.  Differential diagnosis includes but is limited to acute asthma exacerbation, CHF, PE, pleural effusion, pneumonia, ACS,  dysrhythmia.  Patient was tachypneic on intake, vital signs otherwise normal.  She is not requiring any supplemental oxygen at this time.  Cardiopulmonary exam revealed extensive wheezing of the lung fields bilaterally with accessory muscle use and decreased air movement throughout the lung fields.  Patient is tachypneic and is dyspneic even with speech at time of my evaluation.  Prior to my eval she was administered 2 DuoNeb's by providers in triage with reported improvement in her symptoms though she continues to have the above findings.  Continuous albuterol nebulizer ordered as well as IV Solu-Medrol.           Amount and/or Complexity of Data Reviewed Labs:     Details: CBC with mild cytosis of 13.8, no anemia.  BMP with very mildly elevated creatinine 1.05, closer to 0.9 at baseline.  Respiratory pathogen panel is negative. Radiology:     Details: Chest x-ray negative for acute cardiopulmonary disease. ECG/medicine tests: ordered and independent interpretation performed.    Details: EKG with normal sinus rhythm without STEMI. Discussion of management or test interpretation with external provider(s): Patient discussed with hospitalist above who is agreeable to remain this patient for further stabilization of her acute asthma exacerbation.  Risk Prescription drug management. Decision regarding hospitalization.   Patient reevaluated after cat and continues to have wheezing throughout lung fields bilaterally, prolonged expiratory phase with pause, accessory muscle use, and dyspnea with speech.  I do feel she would benefit from mission to the hospital. Wendy Morgan voiced understanding for medical evaluation and treatment plan.  Each of her questions was answered to her expressed satisfaction.  She is amenable to plan for admission at this time.   This chart was dictated using voice recognition software, Dragon. Despite the best efforts of this provider to proofread and correct errors,  errors may still occur which can change documentation meaning.    Final Clinical Impression(s) / ED Diagnoses Final diagnoses:  None    Rx / DC Orders ED Discharge Orders     None         Sherrilee Gilles 05/04/21 0750    Nira Conn, MD 05/05/21 [phone]

## 2021-05-04 NOTE — H&P (Signed)
History and Physical    Patient: Wendy Morgan [id number]:[id number] DOB: 24-Sep-1975 DOA: 05/03/2021 DOS: the patient was seen and examined on 05/04/2021 PCP: Claiborne Rigg, NP  Patient coming from: Home  Chief Complaint: Dyspnea with exertion, wheezing  HPI:  Wendy Morgan is a 46 yo F with asthma on Qvar p/w worsening cough, dyspnea, wheezing not relieved with home inhalers.  Was admitted late Dec for asthma flare.  Discharged with 5 day prednisone burst, improved back to normal but when prednisone ended she resumed her home Qvar but gradually worsened until she was very dyspneic, was using Albuterol multiple times in a row and wheezing heavily, couldn't function and returned to the ER.  In the ER, SpO2 normal on room air, CXR clear.  Wheezing severely and dyspneic with exertion, so given mag, steroids and the hospitalist service was asked to evaluate for asthma.        Review of Systems: Review of Systems  Constitutional:  Negative for chills, fever and malaise/fatigue.  HENT:  Negative for congestion and sore throat.   Respiratory:  Positive for cough, shortness of breath and wheezing. Negative for hemoptysis and sputum production.   Cardiovascular:  Negative for chest pain.  All other systems reviewed and are negative.    Past Medical History:  Diagnosis Date   Asthma    Bronchiectasis with acute exacerbation (HCC)    Class 1 obesity 04/11/2021   Past Surgical History:  Procedure Laterality Date   TUBAL LIGATION     Social History:  reports that she quit smoking about 25 years ago. She has never used smokeless tobacco. She reports that she does not drink alcohol and does not use drugs.  No Known Allergies  Family History  Problem Relation Age of Onset   Healthy Mother    Healthy Father    Kidney disease Neg Hx    Anxiety disorder Neg Hx    Depression Neg Hx     Prior to Admission medications   Medication Sig Start Date End Date Taking? Authorizing Provider   albuterol (VENTOLIN HFA) 108 (90 Base) MCG/ACT inhaler Inhale 1-2 puffs into the lungs every 6 (six) hours as needed for wheezing or shortness of breath. 03/13/21  Yes Tanda Rockers A, DO  beclomethasone (QVAR REDIHALER) 40 MCG/ACT inhaler Inhale 2 puffs into the lungs 2 (two) times daily. 04/12/21 06/02/21 Yes Zigmund Daniel., MD  cetirizine (ZYRTEC ALLERGY) 10 MG tablet Take 1 tablet (10 mg total) by mouth daily for 14 days. Patient not taking: Reported on 04/11/2021 03/13/21 04/11/22  Tanda Rockers A, DO  fluticasone [facility]) 50 MCG/ACT nasal spray Place 1 spray into both nostrils daily for 14 days. Patient not taking: Reported on 05/04/2021 03/13/21 04/11/22  Sloan Leiter, DO    Physical Exam: Vitals:   05/04/21 0800 05/04/21 0900 05/04/21 0906 05/04/21 1000  BP: 126/64   106/65  Pulse: 85   99  Resp: 20 18  20   Temp:      TempSrc:      SpO2: 96%   95%  Weight:   90.7 kg   Height:   5\' 4"  (1.626 m)    General appearance: Adult female, sitting up in bed, no acute distress, seems out of breath with talking.     HEENT: Anicteric, conjunctival pink, lids and lashes normal.  No nasal deformity, discharge, epistaxis, dentition normal.,  Lips normal Skin: No suspicious rashes or lesions. Cardiac: RRR, no murmurs, no lower extremity edema, JVP normal  Respiratory: Increased respiratory effort with exertion, wheezing bilaterally with inspiration and expiration, lung sounds diminished. Abdomen: Abdomen soft no tenderness palpation or guarding, no ascites or distention. MSK: Normal muscle bulk and tone Neuro: Alert and awake, extraocular movements intact, face symmetric, speech fluent, moves upper extremities with normal strength and coordination. Psych: Attention normal, affect normal, judgment and insight appear normal.       Data Reviewed:  Basic metabolic panel notable for normal renal function and electrolytes.  Complete blood count notable for mild leukocytosis.  COVID test  negative.  Chest x-ray personally reviewed, shows no airspace disease or opacity.  ECG personally reviewed shows normal sinus rhythm, normal right           Assessment/Plan * Asthma, chronic, moderate persistent, with acute exacerbation- (present on admission) - Continue steroids - Schedule albuterol QID  -Observe overnight, ambulate tomorrow and if no desaturation with ambulation we will discharge home with prednisone, possibly a taper this time as she rebounded after 5-day burst last time       Class 1 obesity- (present on admission) BMI 34  Allergic rhinitis with postnasal drip- (present on admission) - Resume loratadine    Advance Care Planning:   Code Status: Full Code   Consults: None  Family Communication: None  Severity of Illness: The appropriate patient status for this patient is OBSERVATION. Observation status is judged to be reasonable and necessary in order to provide the required intensity of service to ensure the patient's safety. The patient's presenting symptoms, physical exam findings, and initial radiographic and laboratory data in the context of their medical condition is felt to place them at decreased risk for further clinical deterioration. Furthermore, it is anticipated that the patient will be medically stable for discharge from the hospital within 2 midnights of admission.   Author: Alberteen Sam, MD 05/04/2021 1:18 PM  For on call review [email or web address].

## 2021-05-04 NOTE — Assessment & Plan Note (Signed)
-   Resume loratadine

## 2021-05-04 NOTE — Hospital Course (Signed)
Wendy Morgan is a 46 yo F with asthma on Qvar p/w worsening cough, dyspnea, wheezing not relieved with home inhalers.  Was admitted late Dec for asthma flare.  Discharged with 5 day prednisone burst, improved back to normal but when prednisone ended she resumed her home Qvar but gradually worsened until she was very dyspneic, was using Albuterol multiple times in a row and wheezing heavily, couldn't function and returned to the ER.  In the ER, SpO2 normal on room air, CXR clear.  Wheezing severely and dyspneic with exertion, so given mag, steroids and the hospitalist service was asked to evaluate for asthma.

## 2021-05-04 NOTE — Assessment & Plan Note (Signed)
-   Continue steroids - Schedule albuterol QID

## 2021-05-04 NOTE — ED Provider Triage Note (Signed)
Emergency Medicine Provider Triage Evaluation Note  Wendy Morgan , a 46 y.o. female  was evaluated in triage.  Pt complains of SOB related to asthma.  Has been using rescue inhaler at home several times a day without relief.  Does not have neb machine.  Has required admission for asthma in the past, no prior intubations.  Review of Systems  Positive: Wheezing, SOB Negative: fever  Physical Exam  BP 119/81 (BP Location: Right Arm)    Pulse 80    Temp 97.9 F (36.6 C) (Oral)    Resp 19    SpO2 99%   Gen:   Awake, no distress   Resp:  Diffuse wheezes throughout, speaking in shortened sentences MSK:   Moves extremities without difficulty  Other:    Medical Decision Making  Medically screening exam initiated at 12:45 AM.  Appropriate orders placed.  Doreene Burke RACHAEL FERRIE was informed that the remainder of the evaluation will be completed by another provider, this initial triage assessment does not replace that evaluation, and the importance of remaining in the ED until their evaluation is complete.  SOB.  Diffuse wheezing on exam.  Will start duoneb, obtain EKG, labs, CXR, covid/flu screen.   Garlon Hatchet, PA-C 05/04/21 [phone]

## 2021-05-05 ENCOUNTER — Other Ambulatory Visit (HOSPITAL_COMMUNITY): Payer: Self-pay

## 2021-05-05 ENCOUNTER — Other Ambulatory Visit: Payer: Self-pay

## 2021-05-05 LAB — HIV ANTIBODY (ROUTINE TESTING W REFLEX): HIV Screen 4th Generation wRfx: NONREACTIVE

## 2021-05-05 MED ORDER — PREDNISONE 10 MG PO TABS
ORAL_TABLET | ORAL | 0 refills | Status: AC
  Filled 2021-05-05: qty 27, 11d supply, fill #0

## 2021-05-05 NOTE — Discharge Summary (Signed)
Physician Discharge Summary  Wendy Morgan VVO:[id number] DOB: [birth year] DOA: 05/03/2021  PCP: Claiborne Rigg, NP  Admit date: 05/03/2021 Discharge date: 05/05/2021  Admitted From: home Disposition:  home  Recommendations for Outpatient Follow-up:  Follow up with PCP in 1-2 weeks Please obtain BMP/CBC in one week Please follow up on the following pending results:  Home Health:no  Equipment/Devices: none  Discharge Condition: Stable Code Status:   Code Status: Full Code Diet recommendation:  Diet Order             Diet regular Room service appropriate? Yes; Fluid consistency: Thin  Diet effective now                    Brief/Interim Summary: 46 yo F with asthma on Qvar p/w worsening cough, dyspnea, wheezing not relieved with home inhalers, who was admitted late Dec for asthma flare.  Discharged with 5 day prednisone burst, improved back to normal but when prednisone ended she resumed her home Qvar but gradually worsened until she was very dyspneic, was using Albuterol multiple times in a row and wheezing heavily, couldn't function and returned to the ER.  In the ER, SpO2 normal on room air, CXR clear.  Wheezing severely and dyspneic with exertion, so given mag, steroids and the hospitalist service was asked to evaluate for asthma. Patient was admitted overnight for IV steroids bronchodilators.  She had remarkable improvement overnight on my exam she was not wheezing not short of breath.  Has been ambulating in the room.  She would like to go home today.  I will discharge her on somewhat longer steroid taper than previous burst steroid dosing.  Discharge Diagnoses:  Asthma, chronic, moderate persistent, with acute exacerbation: Resolved.  Doing well not wheezing, will discharge on oral steroid taper.  She will resume her home inhalers.    Class 1 obesit:BMI 34, will benefit with outpatient PCP follow-up weight loss and sleep apnea evaluation.   Allergic rhinitis with  postnasal drip: stable cont home loratadine  Consults: none  Subjective: Alert awake oriented this morning not on oxygen.  Not wheezing.  No cough.  Feels well and strong enough to go home today. Discharge Exam: Vitals:   05/05/21 0741 05/05/21 0831  BP:  124/77  Pulse:  79  Resp:  19  Temp:  97.6 F (36.4 C)  SpO2: 95% 94%   General: Pt is alert, awake, not in acute distress Cardiovascular: RRR, S1/S2 +, no rubs, no gallops Respiratory: CTA bilaterally, no wheezing, no rhonchi Abdominal: Soft, NT, ND, bowel sounds + Extremities: no edema, no cyanosis  Discharge Instructions  Discharge Instructions     Discharge instructions   Complete by: As directed    Please call call MD or return to ER for similar or worsening recurring problem that brought you to hospital or if any fever,nausea/vomiting,abdominal pain, uncontrolled pain, chest pain,  shortness of breath or any other alarming symptoms.  Please follow-up your doctor as instructed in a week time and call the office for appointment.  Please avoid alcohol, smoking, or any other illicit substance and maintain healthy habits including taking your regular medications as prescribed.  You were cared for by a hospitalist during your hospital stay. If you have any questions about your discharge medications or the care you received while you were in the hospital after you are discharged, you can call the unit and ask to speak with the hospitalist on call if the hospitalist that took care of you  is not available.  Once you are discharged, your primary care physician will handle any further medical issues. Please note that NO REFILLS for any discharge medications will be authorized once you are discharged, as it is imperative that you return to your primary care physician (or establish a relationship with a primary care physician if you do not have one) for your aftercare needs so that they can reassess your need for medications and  monitor your lab values   Increase activity slowly   Complete by: As directed       Allergies as of 05/05/2021   No Known Allergies      Medication List     TAKE these medications    albuterol 108 (90 Base) MCG/ACT inhaler Commonly known as: VENTOLIN HFA Inhale 1-2 puffs into the lungs every 6 (six) hours as needed for wheezing or shortness of breath.   cetirizine 10 MG tablet Commonly known as: ZyrTEC Allergy Take 1 tablet (10 mg total) by mouth daily for 14 days.   fluticasone 50 MCG/ACT nasal spray Commonly known as: FLONASE Place 1 spray into both nostrils daily for 14 days.   predniSONE 10 MG tablet Commonly known as: DELTASONE Take 4 tablets (40 mg total) by mouth daily for 2 days, THEN 3 tablets (30 mg total) daily for 3 days, THEN 2 tablets (20 mg total) daily for 3 days, THEN 1 tablet (10 mg total) daily for 3 days. Start taking on: May 05, 2021   Qvar RediHaler 40 MCG/ACT inhaler Generic drug: beclomethasone Inhale 2 puffs into the lungs 2 (two) times daily.        Follow-up Information     Claiborne RiggFleming, Zelda W, NP. Schedule an appointment as soon as possible for a visit in 1 week(s).   Specialty: Nurse Practitioner Why: Hospital follow up Contact information: [address] [place] [address] [phone]                No Known Allergies  The results of significant diagnostics from this hospitalization (including imaging, microbiology, ancillary and laboratory) are listed below for reference.    Microbiology: Recent Results (from the past 240 hour(s))  Resp Panel by RT-PCR (Flu A&B, Covid) Nasopharyngeal Swab     Status: None   Collection Time: 05/04/21 12:47 AM   Specimen: Nasopharyngeal Swab; Nasopharyngeal(NP) swabs in vial transport medium  Result Value Ref Range Status   SARS Coronavirus 2 by RT PCR NEGATIVE NEGATIVE Final    Comment: (NOTE) SARS-CoV-2 target nucleic acids are NOT DETECTED.  The SARS-CoV-2 RNA is generally  detectable in upper respiratory specimens during the acute phase of infection. The lowest concentration of SARS-CoV-2 viral copies this assay can detect is 138 copies/mL. A negative result does not preclude SARS-Cov-2 infection and should not be used as the sole basis for treatment or other patient management decisions. A negative result may occur with  improper specimen collection/handling, submission of specimen other than nasopharyngeal swab, presence of viral mutation(s) within the areas targeted by this assay, and inadequate number of viral copies(<138 copies/mL). A negative result must be combined with clinical observations, patient history, and epidemiological information. The expected result is Negative.  Fact Sheet for Patients:  BloggerCourse.comhttps://www.fda.gov/media/152166/download  Fact Sheet for Healthcare Providers:  SeriousBroker.ithttps://www.fda.gov/media/152162/download  This test is no t yet approved or cleared by the [place] FDA and  has been authorized for detection and/or diagnosis of SARS-CoV-2 by FDA under an Emergency Use Authorization (EUA). This EUA will remain  in  effect (meaning this test can be used) for the duration of the COVID-19 declaration under Section 564(b)(1) of the Act, 21 U.S.C.section 360bbb-3(b)(1), unless the authorization is terminated  or revoked sooner.       Influenza A by PCR NEGATIVE NEGATIVE Final   Influenza B by PCR NEGATIVE NEGATIVE Final    Comment: (NOTE) The Xpert Xpress SARS-CoV-2/FLU/RSV plus assay is intended as an aid in the diagnosis of influenza from Nasopharyngeal swab specimens and should not be used as a sole basis for treatment. Nasal washings and aspirates are unacceptable for Xpert Xpress SARS-CoV-2/FLU/RSV testing.  Fact Sheet for Patients: BloggerCourse.comhttps://www.fda.gov/media/152166/download  Fact Sheet for Healthcare Providers: SeriousBroker.ithttps://www.fda.gov/media/152162/download  This test is not yet approved or cleared by the [place] FDA  and has been authorized for detection and/or diagnosis of SARS-CoV-2 by FDA under an Emergency Use Authorization (EUA). This EUA will remain in effect (meaning this test can be used) for the duration of the COVID-19 declaration under Section 564(b)(1) of the Act, 21 U.S.C. section 360bbb-3(b)(1), unless the authorization is terminated or revoked.  Performed at [facility], [address] [place], [place] [address]     Procedures/Studies: DG Chest 2 View  Result Date: 05/04/2021 CLINICAL DATA:  Shortness of breath. EXAM: CHEST - 2 VIEW COMPARISON:  Chest radiograph 04/21/2021. FINDINGS: The heart size and mediastinal contours are within normal limits. Both lungs are clear. The visualized skeletal structures are unremarkable. IMPRESSION: No active cardiopulmonary disease. Electronically Signed   By: Elgie CollardArash  Radparvar M.D.   On: 05/04/2021 01:49   DG Chest 2 View  Result Date: 04/21/2021 CLINICAL DATA:  Asthma exacerbation, shortness of breath. EXAM: CHEST - 2 VIEW COMPARISON:  04/11/2021. FINDINGS: The heart size and mediastinal contours are within normal limits. No consolidation, effusion, or pneumothorax. No acute osseous abnormality. IMPRESSION: No active cardiopulmonary disease. Electronically Signed   By: Thornell SartoriusLaura  Taylor M.D.   On: 04/21/2021 03:31   DG Chest Portable 1 View  Result Date: 04/11/2021 CLINICAL DATA:  Cough. EXAM: PORTABLE CHEST 1 VIEW COMPARISON:  Chest radiograph dated 04/05/2021. FINDINGS: The heart size and mediastinal contours are within normal limits. Both lungs are clear. The visualized skeletal structures are unremarkable. IMPRESSION: No active disease. Electronically Signed   By: Elgie CollardArash  Radparvar M.D.   On: 04/11/2021 02:33    Labs: BNP (last 3 results) No results for input(s): BNP in the last 8760 hours. Basic Metabolic Panel: Recent Labs  Lab 05/04/21 0109  NA 137  K 3.6  CL 104  CO2 27  GLUCOSE 85  BUN 8  CREATININE 1.05*  CALCIUM 8.6*    Liver Function Tests: No results for input(s): AST, ALT, ALKPHOS, BILITOT, PROT, ALBUMIN in the last 168 hours. No results for input(s): LIPASE, AMYLASE in the last 168 hours. No results for input(s): AMMONIA in the last 168 hours. CBC: Recent Labs  Lab 05/04/21 0109  WBC 13.8*  NEUTROABS 7.4  HGB 14.1  HCT 42.6  MCV 88.2  PLT 336   Cardiac Enzymes: No results for input(s): CKTOTAL, CKMB, CKMBINDEX, TROPONINI in the last 168 hours. BNP: Invalid input(s): POCBNP CBG: No results for input(s): GLUCAP in the last 168 hours. D-Dimer No results for input(s): DDIMER in the last 72 hours. Hgb A1c No results for input(s): HGBA1C in the last 72 hours. Lipid Profile No results for input(s): CHOL, HDL, LDLCALC, TRIG, CHOLHDL, LDLDIRECT in the last 72 hours. Thyroid function studies No results for input(s): TSH, T4TOTAL, T3FREE, THYROIDAB in the last  72 hours.  Invalid input(s): FREET3 Anemia work up No results for input(s): VITAMINB12, FOLATE, FERRITIN, TIBC, IRON, RETICCTPCT in the last 72 hours. Urinalysis    Component Value Date/Time   COLORURINE AMBER (A) 09/09/2019 2239   APPEARANCEUR CLOUDY (A) 09/09/2019 2239   LABSPEC 1.032 (H) 09/09/2019 2239   PHURINE 5.0 09/09/2019 2239   GLUCOSEU NEGATIVE 09/09/2019 2239   HGBUR NEGATIVE 09/09/2019 2239   BILIRUBINUR NEGATIVE 09/09/2019 2239   KETONESUR 5 (A) 09/09/2019 2239   PROTEINUR 100 (A) 09/09/2019 2239   NITRITE NEGATIVE 09/09/2019 2239   LEUKOCYTESUR LARGE (A) 09/09/2019 2239   Sepsis Labs Invalid input(s): PROCALCITONIN,  WBC,  LACTICIDVEN Microbiology Recent Results (from the past 240 hour(s))  Resp Panel by RT-PCR (Flu A&B, Covid) Nasopharyngeal Swab     Status: None   Collection Time: 05/04/21 12:47 AM   Specimen: Nasopharyngeal Swab; Nasopharyngeal(NP) swabs in vial transport medium  Result Value Ref Range Status   SARS Coronavirus 2 by RT PCR NEGATIVE NEGATIVE Final    Comment: (NOTE) SARS-CoV-2 target  nucleic acids are NOT DETECTED.  The SARS-CoV-2 RNA is generally detectable in upper respiratory specimens during the acute phase of infection. The lowest concentration of SARS-CoV-2 viral copies this assay can detect is 138 copies/mL. A negative result does not preclude SARS-Cov-2 infection and should not be used as the sole basis for treatment or other patient management decisions. A negative result may occur with  improper specimen collection/handling, submission of specimen other than nasopharyngeal swab, presence of viral mutation(s) within the areas targeted by this assay, and inadequate number of viral copies(<138 copies/mL). A negative result must be combined with clinical observations, patient history, and epidemiological information. The expected result is Negative.  Fact Sheet for Patients:  BloggerCourse.com  Fact Sheet for Healthcare Providers:  SeriousBroker.it  This test is no t yet approved or cleared by the [place] FDA and  has been authorized for detection and/or diagnosis of SARS-CoV-2 by FDA under an Emergency Use Authorization (EUA). This EUA will remain  in effect (meaning this test can be used) for the duration of the COVID-19 declaration under Section 564(b)(1) of the Act, 21 U.S.C.section 360bbb-3(b)(1), unless the authorization is terminated  or revoked sooner.       Influenza A by PCR NEGATIVE NEGATIVE Final   Influenza B by PCR NEGATIVE NEGATIVE Final    Comment: (NOTE) The Xpert Xpress SARS-CoV-2/FLU/RSV plus assay is intended as an aid in the diagnosis of influenza from Nasopharyngeal swab specimens and should not be used as a sole basis for treatment. Nasal washings and aspirates are unacceptable for Xpert Xpress SARS-CoV-2/FLU/RSV testing.  Fact Sheet for Patients: BloggerCourse.com  Fact Sheet for Healthcare  Providers: SeriousBroker.it  This test is not yet approved or cleared by the [place] FDA and has been authorized for detection and/or diagnosis of SARS-CoV-2 by FDA under an Emergency Use Authorization (EUA). This EUA will remain in effect (meaning this test can be used) for the duration of the COVID-19 declaration under Section 564(b)(1) of the Act, 21 U.S.C. section 360bbb-3(b)(1), unless the authorization is terminated or revoked.  Performed at [facility], [address], [place], [place] [address]      Time coordinating discharge: 25 minutes  SIGNED: Lanae Boast, MD  [facility] 05/05/2021, 11:16 AM  If 7PM-7AM, please contact night-coverage [email or web address]

## 2021-05-06 ENCOUNTER — Telehealth: Payer: Self-pay

## 2021-05-06 NOTE — Telephone Encounter (Signed)
Transition Care Management Follow-up Telephone Call   Date of discharge and from where:[facility] 05/05/2021 How have you been since you were released from the hospital? Belling better  Any questions or concerns? No questions/concerns reported.  Items Reviewed: Did the pt receive and understand the discharge instructions provided? have the instructions and have no questions.  Medications obtained and verified? She said have the medication list  and the hospital staff reviewed them in detail prior to discharge. She said she has all of the medications and they have no questions.  Any new allergies since your discharge? None reported  Do you have support at home? Yes, mother  Other (ie: DME, Home Health, etc)       none  Functional Questionnaire: (I = Independent and D = Dependent) ADL's:  Independent.        Follow up appointments reviewed:   PCP Hospital f/u appt confirmed? NP [place] on 05/11/2021@ 1050.  Specialist Hospital f/u appt confirmed? None scheduled at this time  Are transportation arrangements needed? have transportation   If their condition worsens, is the pt aware to call  their PCP or go to the ED? Yes.Made pt aware if condition worsen or start experiencing rapid weight gain, chest pain, diff breathing, SOB, high fevers, or bleading to refer imediately to ED for further evaluation.  Was the patient provided with contact information for the PCP's office or ED? He has the phone number  Was the pt encouraged to call back with questions or concerns?yes

## 2021-05-11 ENCOUNTER — Telehealth: Payer: Self-pay | Admitting: Nurse Practitioner

## 2021-05-11 ENCOUNTER — Other Ambulatory Visit: Payer: Self-pay

## 2021-05-11 ENCOUNTER — Ambulatory Visit: Payer: Self-pay | Attending: Nurse Practitioner | Admitting: Nurse Practitioner

## 2021-05-11 NOTE — Telephone Encounter (Signed)
NO ANSWER LVM

## 2021-05-17 ENCOUNTER — Other Ambulatory Visit (HOSPITAL_COMMUNITY): Payer: Self-pay

## 2021-05-24 ENCOUNTER — Other Ambulatory Visit: Payer: Self-pay

## 2021-06-20 ENCOUNTER — Other Ambulatory Visit: Payer: Self-pay

## 2021-06-20 ENCOUNTER — Ambulatory Visit: Payer: Self-pay | Attending: Nurse Practitioner

## 2021-06-20 ENCOUNTER — Telehealth: Payer: Self-pay

## 2021-06-20 DIAGNOSIS — Z111 Encounter for screening for respiratory tuberculosis: Secondary | ICD-10-CM

## 2021-06-20 NOTE — Telephone Encounter (Signed)
Attempt to call patient regarding medication administered in the office. Encouraged patient to return to office. Patient stated she was getting on the bus. I encouraged pt to come back to the office and I will give her a bus pass. Pt stated that is fine and she will come back.  ? ?

## 2021-06-20 NOTE — Telephone Encounter (Signed)
Attempt to call patient several times, leaving a voicemail twice.  ?Patient encouraged to call office back. Did not leave detailed message on voicemail d/t HIPAA an DPR. ? ? ?Would encourage patient to return to office for  hypoglycemia monitoring and to have PPD placement.  ? ? ?Notified Lead Physician of incident. Advised to have patient return to office for the same.  ? ? ? ?

## 2021-06-27 ENCOUNTER — Emergency Department (HOSPITAL_COMMUNITY)
Admission: EM | Admit: 2021-06-27 | Discharge: 2021-06-27 | Disposition: A | Discharge status: Home / Self Care | Payer: Self-pay | Attending: Emergency Medicine | Admitting: Emergency Medicine

## 2021-06-27 ENCOUNTER — Other Ambulatory Visit: Payer: Self-pay

## 2021-06-27 ENCOUNTER — Emergency Department (HOSPITAL_COMMUNITY): Payer: Self-pay

## 2021-06-27 ENCOUNTER — Encounter (HOSPITAL_COMMUNITY): Payer: Self-pay | Admitting: Emergency Medicine

## 2021-06-27 DIAGNOSIS — J4521 Mild intermittent asthma with (acute) exacerbation: Secondary | ICD-10-CM | POA: Insufficient documentation

## 2021-06-27 MED ORDER — BECLOMETHASONE DIPROP HFA 40 MCG/ACT IN AERB
1.0000 | INHALATION_SPRAY | Freq: Two times a day (BID) | RESPIRATORY_TRACT | 0 refills | Status: AC
  Filled 2021-06-27 – 2021-07-06 (×2): qty 10.6, 30d supply, fill #0

## 2021-06-27 MED ORDER — IPRATROPIUM-ALBUTEROL 0.5-2.5 (3) MG/3ML IN SOLN
3.0000 mL | Freq: Once | RESPIRATORY_TRACT | Status: AC
  Administered 2021-06-27: 3 mL via RESPIRATORY_TRACT
  Filled 2021-06-27: qty 3

## 2021-06-27 MED ORDER — METHYLPREDNISOLONE SODIUM SUCC 125 MG IJ SOLR
125.0000 mg | Freq: Once | INTRAMUSCULAR | Status: AC
  Administered 2021-06-27: 125 mg via INTRAMUSCULAR
  Filled 2021-06-27: qty 2

## 2021-06-27 MED ORDER — PREDNISONE 10 MG PO TABS
40.0000 mg | ORAL_TABLET | Freq: Every day | ORAL | 0 refills | Status: AC
  Filled 2021-06-27: qty 20, 5d supply, fill #0

## 2021-06-27 NOTE — ED Provider Notes (Cosign Needed)
[facility] EMERGENCY DEPARTMENT Provider Note   CSN: [id number] Arrival date & time: 06/27/21  0932     History Medical history of asthma, obesity  Chief Complaint  Patient presents with   Asthma    Wendy Morgan is a 46 y.o. female. Patient presents emergency department with an asthma flare.  She states that about 1 week ago she started having worsening wheezing and difficulty breathing.  She has been using her albuterol inhaler regularly without any relief.  She says she was also using her steroid inhaler, however she ran out of this 3 days ago.  In the past 3 days she feels like her asthma has gotten much worse.  She has an associated cough that is dry and is normal for her asthma flares.  She denies any recent URI.  She does typically get asthma flares at the weather change and has seasonal allergies.  She denies any chest pain, abdominal pain, nausea, vomiting, leg swelling, fevers, chills, congestion, sore throat.   Asthma Associated symptoms include shortness of breath.      Home Medications Prior to Admission medications   Medication Sig Start Date End Date Taking? Authorizing Provider  beclomethasone (QVAR) 40 MCG/ACT inhaler Inhale 1 puff into the lungs 2 (two) times daily. 06/27/21 07/27/21 Yes Karista Aispuro, Finis Bud, PA-C  predniSONE (DELTASONE) 10 MG tablet Take 4 tablets (40 mg total) by mouth daily for 5 days. 06/27/21 07/02/21 Yes Veleria Barnhardt, Finis Bud, PA-C  albuterol (VENTOLIN HFA) 108 (90 Base) MCG/ACT inhaler Inhale 1-2 puffs into the lungs every 6 (six) hours as needed for wheezing or shortness of breath. 03/13/21   Sloan Leiter, DO  cetirizine (ZYRTEC ALLERGY) 10 MG tablet Take 1 tablet (10 mg total) by mouth daily for 14 days. Patient not taking: Reported on 04/11/2021 03/13/21 04/11/22  Tanda Rockers A, DO  fluticasone [facility]) 50 MCG/ACT nasal spray Place 1 spray into both nostrils daily for 14 days. Patient not taking: Reported on 05/04/2021  03/13/21 04/11/22  Sloan Leiter, DO      Allergies    Patient has no known allergies.    Review of Systems   Review of Systems  Respiratory:  Positive for cough, shortness of breath and wheezing.   All other systems reviewed and are negative.  Physical Exam Updated Vital Signs BP 120/85    Pulse 72    Temp 98.5 F (36.9 C) (Oral)    Resp 20    SpO2 95%  Physical Exam Vitals and nursing note reviewed.  Constitutional:      General: She is not in acute distress.    Appearance: Normal appearance. She is not ill-appearing, toxic-appearing or diaphoretic.  HENT:     Head: Normocephalic and atraumatic.     Nose: No nasal deformity.     Mouth/Throat:     Lips: Pink. No lesions.     Mouth: Mucous membranes are moist. No injury, lacerations, oral lesions or angioedema.     Pharynx: Oropharynx is clear. Uvula midline. No pharyngeal swelling, oropharyngeal exudate, posterior oropharyngeal erythema or uvula swelling.  Eyes:     General: Gaze aligned appropriately. No scleral icterus.       Right eye: No discharge.        Left eye: No discharge.     Conjunctiva/sclera: Conjunctivae normal.     Right eye: Right conjunctiva is not injected. No exudate or hemorrhage.    Left eye: Left conjunctiva is not injected. No exudate or hemorrhage.  Cardiovascular:     Rate and Rhythm: Normal rate and regular rhythm.     Pulses: Normal pulses.          Radial pulses are 2+ on the right side and 2+ on the left side.       Dorsalis pedis pulses are 2+ on the right side and 2+ on the left side.     Heart sounds: Normal heart sounds, S1 normal and S2 normal. Heart sounds not distant. No murmur heard.   No friction rub. No gallop. No S3 or S4 sounds.  Pulmonary:     Effort: Pulmonary effort is normal. No accessory muscle usage or respiratory distress.     Breath sounds: No stridor. Examination of the right-upper field reveals wheezing. Examination of the left-upper field reveals wheezing. Examination of  the right-middle field reveals wheezing. Examination of the left-middle field reveals wheezing. Examination of the right-lower field reveals wheezing. Examination of the left-lower field reveals wheezing. Wheezing present. No rhonchi or rales.     Comments: Wheezing in bilateral upper, mid, and lower lung sounds. Cough sounds dry without any production. Chest:     Chest wall: No tenderness.  Abdominal:     General: Abdomen is flat. Bowel sounds are normal. There is no distension.     Palpations: Abdomen is soft. There is no mass or pulsatile mass.     Tenderness: There is no abdominal tenderness. There is no guarding or rebound.  Musculoskeletal:     Right lower leg: No edema.     Left lower leg: No edema.  Skin:    General: Skin is warm and dry.     Coloration: Skin is not jaundiced or pale.     Findings: No bruising, erythema, lesion or rash.  Neurological:     General: No focal deficit present.     Mental Status: She is alert and oriented to person, place, and time.     GCS: GCS eye subscore is 4. GCS verbal subscore is 5. GCS motor subscore is 6.  Psychiatric:        Mood and Affect: Mood normal.        Behavior: Behavior normal. Behavior is cooperative.    ED Results / Procedures / Treatments   Labs (all labs ordered are listed, but only abnormal results are displayed) Labs Reviewed - No data to display  EKG None  Radiology DG Chest 1 View  Result Date: 06/27/2021 CLINICAL DATA:  Shortness of breath.  Asthma exacerbation. EXAM: CHEST  1 VIEW COMPARISON:  05/04/2021 FINDINGS: Heart size is normal. No pleural effusion or edema. No airspace opacities identified. Visualized osseous structures are intact. IMPRESSION: No active disease. Electronically Signed   By: Signa Kell M.D.   On: 06/27/2021 10:40    Procedures Procedures  This patient was on telemetry or cardiac monitoring during their time in the ED.    Medications Ordered in ED Medications   ipratropium-albuterol (DUONEB) 0.5-2.5 (3) MG/3ML nebulizer solution 3 mL (3 mLs Nebulization Given 06/27/21 1057)  methylPREDNISolone sodium succinate (SOLU-MEDROL) 125 mg/2 mL injection 125 mg (125 mg Intramuscular Given 06/27/21 1058)    ED Course/ Medical Decision Making/ A&P Clinical Course as of 06/27/21 1447  Mon Jun 27, 2021  1050 Needs refill for beclomethasone dipropionate hfa inhaler [GL]    Clinical Course User Index [GL] Voris Tigert, Finis Bud, PA-C  Medical Decision Making Risk Prescription drug management.    MDM  This is a 46 y.o. female with a pertinent PMH of asthma who presents to the ED with shortness of breath, wheezing, and cough.  This is likely very typical of patient's asthma exasperations.  This is in the setting of running out of her steroid inhaler 3 days ago.  She is not exhibiting any URI or infectious symptoms. VSS. Afebrile.  Exam with bilateral wheezing in all lung fields.  Patient is not in any obvious respiratory distress.  No tachypnea.  No nasal flaring, or intercostal retractions. Plan to give DuoNeb here with steroid injection. CXR was obtained in triage.  I personally ordered, reviewed, and interpreted all laboratory work and imaging and agree with radiologist interpretation. Results interpreted below:   Chest x-ray negative  On reassessment, patient is breathing is much improved.  She has minimal wheezing on exam.  She feels much better and wishes to go home at this time.  Think that this is reasonable given improvement of symptoms.  Will discharge on prednisone burst.  We will also refill albuterol inhaler that she has run out of.   Charting Requirements Additional history is obtained from:  Independent historian External Records from outside source obtained and reviewed including: January hospitalization for asthma Social Determinants of Health:  Access to medical care Pertinant PMH that complicates patient's illness:  Asthma  Patient Care Problems that were addressed during this visit: - Asthma: Chronic illness with exacerbation, progression, or side effects of treatment This patient was maintained on a cardiac monitor/telemetry. I personally viewed and interpreted the cardiac monitor which reveals an underlying rhythm of NSR Medications given in ED: Duoneb, solumedrol Reevaluation of the patient after these medicines showed that the patient improved Disposition: d/c with prednisone. Refill inhaler. Return precautions  Portions of this note were generated with Dragon dictation software. Dictation errors may occur despite best attempts at proofreading.    Final Clinical Impression(s) / ED Diagnoses Final diagnoses:  Mild intermittent asthma with exacerbation    Rx / DC Orders ED Discharge Orders          Ordered    predniSONE (DELTASONE) 10 MG tablet  Daily        06/27/21 1155    beclomethasone (QVAR) 40 MCG/ACT inhaler  2 times daily        06/27/21 1155              Koltyn Kelsay, Finis BudGrace C, PA-C 06/27/21 1448

## 2021-06-27 NOTE — ED Provider Triage Note (Signed)
Emergency Medicine Provider Triage Evaluation Note ? ?Wendy Morgan , a 46 y.o. female  was evaluated in triage.  Pt complains of asthma exacerbation.  Reports she has been feeling short of breath for the past week after running out of her Qvar inhaler.  Reports she has been using her albuterol frequently however this is no longer helping.  Endorses a cough occasionally productive of green sputum however recently it has been clear. ? ?Review of Systems  ?Positive: See above ?Negative: Chest pain ? ?Physical Exam  ?BP 134/83 (BP Location: Right Arm)   Pulse 89   Temp 98.5 ?F (36.9 ?C) (Oral)   Resp 20   SpO2 96%  ?Gen:   Awake, no distress   ?Resp:  Normal effort  ?MSK:   Moves extremities without difficulty  ?Other:  Wheezing and rhonchi in all lung fields, worse in the upper.  Speaking in complete sentences, no respiratory distress. ? ?Medical Decision Making  ?Medically screening exam initiated at 10:23 AM.  Appropriate orders placed.  Doreene Burke JANYIAH SILVERI was informed that the remainder of the evaluation will be completed by another provider, this initial triage assessment does not replace that evaluation, and the importance of remaining in the ED until their evaluation is complete. ? ? ?  ?Saddie Benders, PA-C ?06/27/21 1025 ? ?

## 2021-06-27 NOTE — ED Triage Notes (Signed)
Patient with history of asthma here with complaint of asthma exacerbation that started approximately one week ago. Patient states she ran out of several of the asthma medications approximately two weeks ago. Patient alert, oriented, speaking in complete sentences, and is in no apparent distress at this time. ?

## 2021-06-27 NOTE — Discharge Instructions (Signed)
I have prescribed you a prednisone burst pack as well as refilled your inhaler. ?Please return if you develop worsening symptoms. ?

## 2021-06-28 ENCOUNTER — Other Ambulatory Visit: Payer: Self-pay

## 2021-07-05 ENCOUNTER — Other Ambulatory Visit: Payer: Self-pay

## 2021-07-06 ENCOUNTER — Other Ambulatory Visit: Payer: Self-pay

## 2021-07-06 ENCOUNTER — Encounter (HOSPITAL_COMMUNITY): Payer: Self-pay

## 2021-07-06 ENCOUNTER — Emergency Department (HOSPITAL_COMMUNITY): Payer: Self-pay

## 2021-07-06 ENCOUNTER — Emergency Department (HOSPITAL_COMMUNITY)
Admission: EM | Admit: 2021-07-06 | Discharge: 2021-07-06 | Disposition: A | Discharge status: Home / Self Care | Payer: Self-pay | Attending: Emergency Medicine | Admitting: Emergency Medicine

## 2021-07-06 DIAGNOSIS — J4540 Moderate persistent asthma, uncomplicated: Secondary | ICD-10-CM | POA: Insufficient documentation

## 2021-07-06 DIAGNOSIS — Z20822 Contact with and (suspected) exposure to covid-19: Secondary | ICD-10-CM | POA: Insufficient documentation

## 2021-07-06 DIAGNOSIS — Z79899 Other long term (current) drug therapy: Secondary | ICD-10-CM | POA: Insufficient documentation

## 2021-07-06 LAB — RESP PANEL BY RT-PCR (FLU A&B, COVID) ARPGX2
Influenza A by PCR: NEGATIVE
Influenza B by PCR: NEGATIVE
SARS Coronavirus 2 by RT PCR: NEGATIVE

## 2021-07-06 MED ORDER — IPRATROPIUM-ALBUTEROL 0.5-2.5 (3) MG/3ML IN SOLN
3.0000 mL | Freq: Once | RESPIRATORY_TRACT | Status: AC
  Administered 2021-07-06: 3 mL via RESPIRATORY_TRACT
  Filled 2021-07-06: qty 3

## 2021-07-06 NOTE — Discharge Instructions (Addendum)
You were seen in the emergency department today for asthma.  I am referring you to pulmonology for them to evaluate your asthma symptoms and better optimize your care.  If they do not call you in 2 days please give them a call to set up an appointment.  Please continue to take your inhalers as prescribed.  Please pick up your steroid inhaler.  Please return to the emergency department if you are having any difficulty breathing. ?

## 2021-07-06 NOTE — ED Provider Notes (Signed)
?[facility] DEPT ?Provider Note ? ? ?CSN: [id number] ?Arrival date & time: 07/06/21  0930 ? ?  ? ?History ? ?Chief Complaint  ?Patient presents with  ? Asthma  ? ? ?Wendy Morgan is a 46 y.o. female.  With past medical history of asthma who presents to the emergency department with cough, wheezing. ? ?States that she was seen about a week ago at [facility] for similar symptoms.  She states that she was given a 5-day course of steroids.  She states that she has had ongoing cough, particularly in the evenings.  She also still feels congested and is having mild shortness of breath.  She states that she has been using her inhalers without relief of her wheezing.  She denies fevers, sore throat, rhinorrhea, chest pain ? ?Review of the medical record it appears that she was seen at [facility] on 06/27/2021 for asthma.  At that time she had chest x-ray which was negative, she was given DuoNeb and 125 mg Solu-Medrol.  She was given a prescription for home inhaler as well as prednisone and discharged. ? ?HPI ? ?  ? ?Home Medications ?Prior to Admission medications   ?Medication Sig Start Date End Date Taking? Authorizing Provider  ?albuterol (VENTOLIN HFA) 108 (90 Base) MCG/ACT inhaler Inhale 1-2 puffs into the lungs every 6 (six) hours as needed for wheezing or shortness of breath. 03/13/21   Jeanell Sparrow, DO  ?beclomethasone (QVAR) 40 MCG/ACT inhaler Inhale 1 puff into the lungs 2 (two) times daily. 06/27/21 07/27/21  Loeffler, Adora Fridge, PA-C  ?cetirizine (ZYRTEC ALLERGY) 10 MG tablet Take 1 tablet (10 mg total) by mouth daily for 14 days. ?Patient not taking: Reported on 04/11/2021 03/13/21 04/11/22  Wynona Dove A, DO  ?fluticasone (FLONASE) 50 MCG/ACT nasal spray Place 1 spray into both nostrils daily for 14 days. ?Patient not taking: Reported on 05/04/2021 03/13/21 04/11/22  Jeanell Sparrow, DO  ?   ? ?Allergies    ?Patient has no known allergies.   ? ?Review of Systems   ?Review of Systems   ?Constitutional:  Negative for fever.  ?HENT:  Positive for congestion. Negative for rhinorrhea, sinus pressure, sinus pain and sore throat.   ?Respiratory:  Positive for cough, shortness of breath and wheezing.   ?Cardiovascular:  Negative for chest pain and palpitations.  ?All other systems reviewed and are negative. ? ?Physical Exam ?Updated Vital Signs ?BP (!) 135/114 (BP Location: Right Arm)   Pulse 76   Temp 97.7 ?F (36.5 ?C) (Oral)   Resp 18   Ht 5\' 4"  (1.626 m)   Wt 90.7 kg   SpO2 97%   BMI 34.33 kg/m?  ?Physical Exam ?Vitals and nursing note reviewed.  ?Constitutional:   ?   General: She is not in acute distress. ?   Appearance: Normal appearance. She is normal weight. She is not ill-appearing or toxic-appearing.  ?HENT:  ?   Head: Normocephalic and atraumatic.  ?   Nose: Congestion present.  ?   Mouth/Throat:  ?   Mouth: Mucous membranes are moist.  ?   Pharynx: Oropharynx is clear. Posterior oropharyngeal erythema present.  ?Eyes:  ?   General: No scleral icterus. ?   Extraocular Movements: Extraocular movements intact.  ?Cardiovascular:  ?   Rate and Rhythm: Normal rate and regular rhythm.  ?   Pulses: Normal pulses.  ?   Heart sounds: Normal heart sounds. No murmur heard. ?Pulmonary:  ?   Effort: Pulmonary  effort is normal. No tachypnea or respiratory distress.  ?   Breath sounds: Examination of the right-upper field reveals wheezing. Examination of the left-upper field reveals wheezing. Examination of the right-middle field reveals wheezing. Examination of the right-lower field reveals wheezing. Examination of the left-lower field reveals wheezing and rhonchi. Wheezing and rhonchi present.  ?Abdominal:  ?   Palpations: Abdomen is soft.  ?Musculoskeletal:     ?   General: Normal range of motion.  ?   Cervical back: Neck supple.  ?Skin: ?   General: Skin is warm and dry.  ?   Capillary Refill: Capillary refill takes less than 2 seconds.  ?   Findings: No rash.  ?Neurological:  ?   General: No  focal deficit present.  ?   Mental Status: She is alert and oriented to person, place, and time. Mental status is at baseline.  ?Psychiatric:     ?   Mood and Affect: Mood normal.     ?   Behavior: Behavior normal.     ?   Thought Content: Thought content normal.     ?   Judgment: Judgment normal.  ? ? ?ED Results / Procedures / Treatments   ?Labs ?(all labs ordered are listed, but only abnormal results are displayed) ?Labs Reviewed  ?RESP PANEL BY RT-PCR (FLU A&B, COVID) [id number]  ? ?EKG ?None ? ?Radiology ?DG Chest 2 View ? ?Result Date: 07/06/2021 ?CLINICAL DATA:  Cough and shortness of breath for the past week. EXAM: CHEST - 2 VIEW COMPARISON:  Chest x-ray dated June 27, 2021. FINDINGS: The heart size and mediastinal contours are within normal limits. Both lungs are clear. The visualized skeletal structures are unremarkable. IMPRESSION: No active cardiopulmonary disease. Electronically Signed   By: Titus Dubin M.D.   On: 07/06/2021 11:36   ? ?Procedures ?Procedures  ? ?Medications Ordered in ED ?Medications  ?ipratropium-albuterol (DUONEB) 0.5-2.5 (3) MG/3ML nebulizer solution 3 mL (has no administration in time range)  ? ? ?ED Course/ Medical Decision Making/ A&P ?  ?                        ?Medical Decision Making ?Amount and/or Complexity of Data Reviewed ?Radiology: ordered. ? ?Risk ?Prescription drug management. ? ?This patient presents to the ED for concern of asthma, this involves an extensive number of treatment options, and is a complaint that carries with it a high risk of complications and morbidity.  The differential diagnosis includes asthma exacerbation, viral upper respiratory infection, pneumonia, etc. ? ?Co morbidities that complicate the patient evaluation ?Asthma ? ?Additional history obtained:  ?Additional history obtained from: None ?External records from outside source obtained and reviewed including: Previous ED visits ? ?Lab Results: ?I personally ordered, reviewed, and interpreted  labs. ?Pertinent results include: ?COVID and flu negative ? ?Imaging Studies ordered:  ?I ordered imaging studies which included x-ray.  I independently reviewed & interpreted imaging & am in agreement with radiology impression. ?Imaging shows: ?Chest x-ray without pneumonia ? ?Medications  ?I ordered medication including DuoNeb for wheezing ?Reevaluation of the patient after medication shows that patient improved ? ?ED Course: ?46 year old female who presents to the emergency department with complaint of wheezing and asthma exacerbation. ? ?She is overall well-appearing and nontoxic in appearance.  There is no respiratory distress or hypoxia on exam.  She does have diffuse wheezing in bilateral lung fields.  Given DuoNeb.  She subjectively feels improved, however still has wheezing. ?Chest x-ray without pneumonia. ?  She is nonfebrile, no rhonchi, no pneumonia on chest x-ray.  Doubt this is infectious in etiology. ? ?She was seen on 06/27/2021 for similar symptoms and prescribed prednisone 40 mg for 5 days.  She states that her symptoms did not improve with this.  She also did not pick up the inhaler that she was prescribed which is beclomethasone. ? ?Discussed with her that we may not get her wheezing completely resolved and she verbalized understanding.  Discussed that she likely needs specialty care with pulmonology given the persistence of her symptoms.  Do not feel that another steroid course will be beneficial at this point.  She has her albuterol inhaler at home.  I have asked her to go pick up her steroid inhaler.  I have given her a ambulatory referral to pulmonology and asked her to call them but they do not give her a call in the next 48 to 72 hours.  Discussed return precautions for worsening shortness of breath or difficulty breathing.  She verbalized understanding ? ?After consideration of the diagnostic results and the patients response to treatment, I feel that the patent would benefit from  discharge. ?The patient has been appropriately medically screened and/or stabilized in the ED. I have low suspicion for any other emergent medical condition which would require further screening, evaluation or treatment in th

## 2021-07-06 NOTE — ED Triage Notes (Signed)
Patient c/o Wendy Morgan and nausea for a week. Patient states use of albuterol inhaler without relief.  ?

## 2021-07-12 NOTE — Progress Notes (Signed)
error 

## 2021-07-25 ENCOUNTER — Ambulatory Visit: Payer: Self-pay | Attending: Nurse Practitioner

## 2021-07-25 DIAGNOSIS — Z111 Encounter for screening for respiratory tuberculosis: Secondary | ICD-10-CM

## 2021-07-25 NOTE — Progress Notes (Signed)
Pt arrived for PPD testing ?PPD placed in right forearm ?Pt to return in 48-72 hours. ?

## 2021-07-27 ENCOUNTER — Ambulatory Visit: Payer: Self-pay | Attending: Nurse Practitioner

## 2021-07-27 LAB — TB SKIN TEST
Induration: 0 mm
TB Skin Test: NEGATIVE

## 2021-08-02 ENCOUNTER — Emergency Department (HOSPITAL_COMMUNITY): Payer: Medicaid Other

## 2021-08-02 ENCOUNTER — Emergency Department (HOSPITAL_COMMUNITY)
Admission: EM | Admit: 2021-08-02 | Discharge: 2021-08-02 | Disposition: A | Discharge status: Home / Self Care | Payer: Medicaid Other | Attending: Emergency Medicine | Admitting: Emergency Medicine

## 2021-08-02 ENCOUNTER — Other Ambulatory Visit: Payer: Self-pay

## 2021-08-02 DIAGNOSIS — J4541 Moderate persistent asthma with (acute) exacerbation: Secondary | ICD-10-CM | POA: Insufficient documentation

## 2021-08-02 LAB — POC URINE PREG, ED: Preg Test, Ur: NEGATIVE

## 2021-08-02 MED ORDER — ALBUTEROL SULFATE HFA 108 (90 BASE) MCG/ACT IN AERS
1.0000 | INHALATION_SPRAY | Freq: Four times a day (QID) | RESPIRATORY_TRACT | 1 refills | Status: DC | PRN
  Filled 2021-08-02: qty 6.7, 25d supply, fill #0
  Filled 2021-08-16: qty 6.7, 25d supply, fill #1

## 2021-08-02 MED ORDER — ALBUTEROL SULFATE (2.5 MG/3ML) 0.083% IN NEBU
5.0000 mg | INHALATION_SOLUTION | Freq: Once | RESPIRATORY_TRACT | Status: AC
  Administered 2021-08-02: 5 mg via RESPIRATORY_TRACT
  Filled 2021-08-02: qty 6

## 2021-08-02 MED ORDER — PREDNISONE 20 MG PO TABS
20.0000 mg | ORAL_TABLET | Freq: Every day | ORAL | 0 refills | Status: AC
  Filled 2021-08-02: qty 4, 4d supply, fill #0

## 2021-08-02 MED ORDER — METHYLPREDNISOLONE SODIUM SUCC 125 MG IJ SOLR
125.0000 mg | Freq: Once | INTRAMUSCULAR | Status: AC
  Administered 2021-08-02: 125 mg via INTRAMUSCULAR
  Filled 2021-08-02: qty 2

## 2021-08-02 MED ORDER — CETIRIZINE HCL 10 MG PO TABS
10.0000 mg | ORAL_TABLET | Freq: Every day | ORAL | 0 refills | Status: DC
Start: 2021-08-02 — End: 2022-04-11
  Filled 2021-08-02: qty 30, 30d supply, fill #0

## 2021-08-02 NOTE — ED Triage Notes (Signed)
Pt. Stated, I started having nasal stopped up , my asthma is acting up and hard to breathe. This started yesterday. ?

## 2021-08-02 NOTE — Discharge Instructions (Signed)
Be sure to keep your appointment with pulmonology on the 27th.  Start your prednisone tomorrow, you have been given a dose of steroids today. ? ?I have also refilled your albuterol inhaler and Zyrtec for your allergies.  Return with any worsening symptoms.  Your work note is attached ?

## 2021-08-02 NOTE — ED Provider Notes (Signed)
?[facility] EMERGENCY DEPARTMENT ?Provider Note ? ? ?CSN: [id number] ?Arrival date & time: 08/02/21  0801 ? ?  ? ?History ? ?Chief Complaint  ?Patient presents with  ? Nasal Congestion  ? Allergies  ? Asthma  ? ? ?Wendy Morgan is a 46 y.o. female with PMH asthma presenting today with nasal congestion and shortness of breath.  She reports for the past 2 days she has been experiencing an increase in wheezing, pressure in her head and cough occasionally productive of clear sputum.  Denies fevers or chills.  Has been using her albuterol inhaler and nebulizer solution around-the-clock however this has not helped her.  Started to take over-the-counter allergy medication yesterday. ? ? ?Asthma ?Associated symptoms include headaches and shortness of breath. Pertinent negatives include no chest pain.  ? ?  ? ?Home Medications ?Prior to Admission medications   ?Medication Sig Start Date End Date Taking? Authorizing Provider  ?albuterol (VENTOLIN HFA) 108 (90 Base) MCG/ACT inhaler Inhale 1-2 puffs into the lungs every 6 (six) hours as needed for wheezing or shortness of breath. 03/13/21   Sloan Leiter, DO  ?beclomethasone (QVAR) 40 MCG/ACT inhaler Inhale 1 puff into the lungs 2 (two) times daily. 06/27/21 08/05/21  Loeffler, Finis Bud, PA-C  ?cetirizine (ZYRTEC ALLERGY) 10 MG tablet Take 1 tablet (10 mg total) by mouth daily for 14 days. ?Patient not taking: Reported on 04/11/2021 03/13/21 04/11/22  Tanda Rockers A, DO  ?fluticasone (FLONASE) 50 MCG/ACT nasal spray Place 1 spray into both nostrils daily for 14 days. ?Patient not taking: Reported on 05/04/2021 03/13/21 04/11/22  Sloan Leiter, DO  ?   ? ?Allergies    ?Patient has no known allergies.   ? ?Review of Systems   ?Review of Systems  ?Constitutional:  Negative for chills and fever.  ?HENT:  Positive for congestion, sinus pressure and sinus pain.   ?Respiratory:  Positive for cough (yellow phlegm), shortness of breath and wheezing.   ?Cardiovascular:   Negative for chest pain and palpitations.  ?Neurological:  Positive for headaches.  ? ?Physical Exam ?Updated Vital Signs ?BP 133/87   Pulse 63   Temp 97.7 ?F (36.5 ?C) (Oral)   Resp 18   Ht 5\' 4"  (1.626 m)   Wt 90.7 kg   LMP 07/25/2021   SpO2 100%   BMI 34.33 kg/m?  ?Physical Exam ?Vitals and nursing note reviewed.  ?Constitutional:   ?   Appearance: Normal appearance.  ?HENT:  ?   Head: Normocephalic and atraumatic.  ?Eyes:  ?   General: No scleral icterus. ?   Conjunctiva/sclera: Conjunctivae normal.  ?Cardiovascular:  ?   Rate and Rhythm: Normal rate and regular rhythm.  ?Pulmonary:  ?   Effort: Pulmonary effort is normal. No respiratory distress.  ?   Breath sounds: Wheezing (All lung fields) present.  ?Skin: ?   Findings: No rash.  ?Neurological:  ?   Mental Status: She is alert.  ?Psychiatric:     ?   Mood and Affect: Mood normal.  ? ? ?ED Results / Procedures / Treatments   ?Labs ?(all labs ordered are listed, but only abnormal results are displayed) ?Labs Reviewed  ?POC URINE PREG, ED  ? ? ?EKG ?None ? ?Radiology ?DG Chest 2 View ? ?Result Date: 08/02/2021 ?CLINICAL DATA:  Cough. EXAM: CHEST - 2 VIEW COMPARISON:  July 06, 2021. FINDINGS: The heart size and mediastinal contours are within normal limits. Both lungs are clear. The visualized skeletal structures are unremarkable.  IMPRESSION: No active cardiopulmonary disease. Electronically Signed   By: Lupita Raider M.D.   On: 08/02/2021 08:32   ? ?Procedures ?Procedures  ? ?Medications Ordered in ED ?Medications  ?albuterol (PROVENTIL) (2.5 MG/3ML) 0.083% nebulizer solution 5 mg (has no administration in time range)  ?methylPREDNISolone sodium succinate (SOLU-MEDROL) 125 mg/2 mL injection 125 mg (has no administration in time range)  ? ? ?ED Course/ Medical Decision Making/ A&P ?  ?                        ?Medical Decision Making ?Amount and/or Complexity of Data Reviewed ?Radiology: ordered. ? ?Risk ?Prescription drug management. ? ? ?This patient  presents to the ED for concern of shortness of breath, nasal congestion.  Differential includes but is not limited to, viral illness, asthma exacerbation, pulmonary embolus, pneumonia, pleural effusion, pneumothorax, COVID/flu ?  ?This is not an exhaustive differential.  ?  ?Past Medical History / Co-morbidities / Social History: ?Asthma ?  ?Additional history: ?Additional history obtained from chart review.  Patient has been seen over 10 times in the past 6 months for asthma exacerbations ?  ?Physical Exam: ?Physical exam performed. The pertinent findings include: Wheezing throughout all lung fields, audible prior to auscultation ? ?Lab Tests: ?I ordered, and personally interpreted labs.  The pertinent results include: N/A ?  ?Imaging Studies: ?I ordered imaging studies including chest x-ray. I independently visualized and interpreted imaging which showed no signs of pneumonia. I agree with the radiologist interpretation. ?  ?Medications: ?I ordered medication including DuoNeb and Solu-Medrol. Reevaluation of the patient after these medicines showed that the patient resolved. I have reviewed the patients home medicines and have made adjustments as needed. ? ?Disposition: ?After consideration of the diagnostic results and the patients response to treatment, I feel that the patient is stable for discharge home.  She is agreeable to this.  I will refill her albuterol inhaler and start her on a prednisone burst.  She reports having an appointment with pulmonology in 10 days, and we discussed the importance of keeping this appointment..  ? ?Final Clinical Impression(s) / ED Diagnoses ?Final diagnoses:  ?Moderate persistent asthma with exacerbation  ? ? ?Rx / DC Orders ?ED Discharge Orders   ? ?      Ordered  ?  albuterol (VENTOLIN HFA) 108 (90 Base) MCG/ACT inhaler  Every 6 hours PRN       ? 08/02/21 1453  ?  cetirizine (ZYRTEC ALLERGY) 10 MG tablet  Daily       ? 08/02/21 1453  ?  predniSONE (DELTASONE) 20 MG tablet   Daily       ? 08/02/21 1453  ? ?  ?  ? ?  ? ?Results and diagnoses were explained to the patient. Return precautions discussed in full. Patient had no additional questions and expressed complete understanding. ? ? ?This chart was dictated using voice recognition software.  Despite best efforts to proofread,  errors can occur which can change the documentation meaning.  ? ?  ?Saddie Benders, PA-C ?08/02/21 1454 ? ?  ?Mancel Bale, MD ?08/02/21 1619 ? ?

## 2021-08-02 NOTE — ED Notes (Signed)
Patient verbalizes understanding of discharge instructions. Opportunity for questioning and answers were provided. Armband removed by staff, pt discharged from ED.  

## 2021-08-03 ENCOUNTER — Other Ambulatory Visit: Payer: Self-pay

## 2021-08-11 ENCOUNTER — Institutional Professional Consult (permissible substitution): Payer: Self-pay | Admitting: Internal Medicine

## 2021-08-16 ENCOUNTER — Ambulatory Visit: Payer: Self-pay | Admitting: *Deleted

## 2021-08-16 ENCOUNTER — Telehealth (HOSPITAL_BASED_OUTPATIENT_CLINIC_OR_DEPARTMENT_OTHER): Payer: Medicaid Other | Admitting: Nurse Practitioner

## 2021-08-16 ENCOUNTER — Telehealth: Payer: Self-pay | Admitting: Nurse Practitioner

## 2021-08-16 ENCOUNTER — Other Ambulatory Visit: Payer: Self-pay

## 2021-08-16 ENCOUNTER — Encounter: Payer: Self-pay | Admitting: Nurse Practitioner

## 2021-08-16 DIAGNOSIS — J4521 Mild intermittent asthma with (acute) exacerbation: Secondary | ICD-10-CM

## 2021-08-16 MED ORDER — ALBUTEROL SULFATE HFA 108 (90 BASE) MCG/ACT IN AERS
1.0000 | INHALATION_SPRAY | Freq: Four times a day (QID) | RESPIRATORY_TRACT | 1 refills | Status: DC | PRN
Start: 2021-08-16 — End: 2023-04-30
  Filled 2021-08-25: qty 18, 25d supply, fill #0

## 2021-08-16 MED ORDER — MONTELUKAST SODIUM 10 MG PO TABS
10.0000 mg | ORAL_TABLET | Freq: Every day | ORAL | 3 refills | Status: DC
  Filled 2021-08-16 – 2021-12-05 (×2): qty 30, 30d supply, fill #0

## 2021-08-16 MED ORDER — BUDESONIDE-FORMOTEROL FUMARATE 160-4.5 MCG/ACT IN AERO
2.0000 | INHALATION_SPRAY | Freq: Two times a day (BID) | RESPIRATORY_TRACT | 3 refills | Status: DC
  Filled 2021-08-16: qty 1, fill #0
  Filled 2021-08-25: qty 10.2, 30d supply, fill #0
  Filled 2021-08-25: qty 10.2, fill #0
  Filled 2021-09-07: qty 30.6, 90d supply, fill #0
  Filled 2021-12-05 – 2021-12-06 (×2): qty 30.6, 90d supply, fill #1
  Filled 2021-12-12: qty 10.2, 30d supply, fill #1

## 2021-08-16 NOTE — Telephone Encounter (Signed)
This was switched to combination inhaler, Symbicort, for improved control. This was sent to her pharmacy today. ?

## 2021-08-16 NOTE — Telephone Encounter (Signed)
Medication Refill - Medication: beclomethasone (QVAR) 40 MCG/ACT inhaler ? ?Has the patient contacted their pharmacy? No. ? ?(Agent: If no, request that the patient contact the pharmacy for the refill. If patient does not wish to contact the pharmacy document the reason why and proceed with request.) ? ? ?Preferred Pharmacy (with phone number or street name):  ?[facility]  ?[address], [address] [place] [address]  ?Phone: [phone] Fax: [phone]  ?Hours: M-F 7:30a-6:00p  ? ?Has the patient been seen for an appointment in the last year OR does the patient have an upcoming appointment? Yes.   ? ?Agent: Please be advised that RX refills may take up to 3 business days. We ask that you follow-up with your pharmacy.  ?

## 2021-08-16 NOTE — Telephone Encounter (Signed)
?  Chief Complaint: coughing a lot.   Green-yellow sputum.  Runny nose  chest congestion with wheezing.   Has asthma.  Been out of her inhaler for 4 days. ?Symptoms: coughing, wheezing, shortness of breath with minimal exertion.   Was using inhaler more frequently but it wasn't helping.   Ran out of her steroid inhaler 4 days ago. ?Frequency: Coughing started this morning ?Pertinent Negatives: Patient denies fever or sore throat ?Disposition: [] ED /[] Urgent Care (no appt availability in office) / [x] Appointment(In office/virtual)/ []  [place] [facility]/ [] Home Care/ [] Refused Recommended Disposition /[] [facility]/ []  Follow-up with PCP ?Additional Notes: MyChart virtual visit made for today with , NP  ?

## 2021-08-16 NOTE — Progress Notes (Signed)
Virtual Visit via Telephone Note ? I discussed the limitations, risks, security and privacy concerns of performing an evaluation and management service by telephone and the availability of in person appointments. I also discussed with the patient that there may be a patient responsible charge related to this service. The patient expressed understanding and agreed to proceed.  ? ? ?I connected with Wendy Morgan on 08/16/21  at   4:10 PM EDT  EDT by telephone and verified that I am speaking with the correct person using two identifiers. ? ?Location of Patient: ?Private Residence ?  ?Location of Provider: ?[employer]  ?  ?Persons participating in Telemedicine visit: ?Bertram Denver FNP-BC ?Trena Brett Fairy  ?  ?History of Present Illness: ?Telemedicine visit for: Asthma ? ?14 ED visits over the past 6 months for asthma exacerbation. Most recent visit was on 08-02-2021 where she was treated with nebulizers and solumedrol. She was discharged home with po prednisone and ventolin inhaler. She states she ran out of her Qvar inhaler. Unfortunately she is uninsured and QVAR is not on a patient assistance program for [place]. We will try her on symbicort. She will also need to apply for the FA program as she needs to be referred to pulmonology. She has not had an actual office in this clinic since 2021. ?She is currently using her albuterol inhaler multiple times per day. Endorses cough, wheezing and shortness of breath.  ? ? ? ?Past Medical History:  ?Diagnosis Date  ? Asthma   ? Bronchiectasis with acute exacerbation (HCC)   ? Class 1 obesity 04/11/2021  ?  ?Past Surgical History:  ?Procedure Laterality Date  ? TUBAL LIGATION    ?  ?Family History  ?Problem Relation Age of Onset  ? Healthy Mother   ? Healthy Father   ? Kidney disease Neg Hx   ? Anxiety disorder Neg Hx   ? Depression Neg Hx   ?  ?Social History  ? ?Socioeconomic History  ? Marital status: Single  ?  Spouse name: Not on file  ?  Number of children: Not on file  ? Years of education: Not on file  ? Highest education level: Not on file  ?Occupational History  ? Not on file  ?Tobacco Use  ? Smoking status: Former  ?  Types: Cigarettes  ?  Quit date: 1998  ?  Years since quitting: 25.3  ? Smokeless tobacco: Never  ?Vaping Use  ? Vaping Use: Never used  ?Substance and Sexual Activity  ? Alcohol use: No  ? Drug use: No  ? Sexual activity: Yes  ?  Birth control/protection: None  ?Other Topics Concern  ? Not on file  ?Social History Narrative  ? Not on file  ? ?Social Determinants of Health  ? ?Financial Resource Strain: Not on file  ?Food Insecurity: Not on file  ?Transportation Needs: Not on file  ?Physical Activity: Not on file  ?Stress: Not on file  ?Social Connections: Not on file  ?  ? ?Observations/Objective: ?Awake, alert and oriented x 3 ? ? ?Review of Systems  ?Constitutional:  Negative for fever, malaise/fatigue and weight loss.  ?HENT: Negative.  Negative for nosebleeds.   ?Eyes: Negative.  Negative for blurred vision, double vision and photophobia.  ?Respiratory:  Positive for cough, shortness of breath and wheezing. Negative for sputum production.   ?Cardiovascular: Negative.  Negative for chest pain, palpitations and leg swelling.  ?Gastrointestinal: Negative.  Negative for heartburn, nausea and vomiting.  ?Musculoskeletal:  Negative.  Negative for myalgias.  ?Neurological: Negative.  Negative for dizziness, focal weakness, seizures and headaches.  ?Psychiatric/Behavioral: Negative.  Negative for suicidal ideas.    ?Assessment and Plan: ?Diagnoses and all orders for this visit: ? ?Poorly controlled intermittent asthma with acute exacerbation ?-     budesonide-formoterol (SYMBICORT) 160-4.5 MCG/ACT inhaler; Inhale 2 puffs into the lungs 2 (two) times daily. NEEDS PASS ?-     albuterol (VENTOLIN HFA) 108 (90 Base) MCG/ACT inhaler; Inhale 1-2 puffs into the lungs every 6 (six) hours as needed for wheezing or shortness of breath. ?-      montelukast (SINGULAIR) 10 MG tablet; Take 1 tablet (10 mg total) by mouth at bedtime. Continue zyrtec in the mornings ? ?  ? ?Follow Up Instructions ?Return in about 4 weeks (around 09/13/2021).  ? ?  ?I discussed the assessment and treatment plan with the patient. The patient was provided an opportunity to ask questions and all were answered. The patient agreed with the plan and demonstrated an understanding of the instructions. ?  ?The patient was advised to call back or seek an in-person evaluation if the symptoms worsen or if the condition fails to improve as anticipated. ? ?I provided 12 minutes of non-face-to-face time during this encounter including median intraservice time, reviewing previous notes, labs, imaging, medications and explaining diagnosis and management. ? ?Claiborne Rigg, FNP-BC  ?

## 2021-08-16 NOTE — Telephone Encounter (Signed)
Reason for Disposition ? [1] Longstanding difficulty breathing (e.g., CHF, COPD, emphysema) AND [2] WORSE than normal ? ?Answer Assessment - Initial Assessment Questions ?1. RESPIRATORY STATUS: "Describe your breathing?" (e.g., wheezing, shortness of breath, unable to speak, severe coughing)  ?    I'm coughing a lot.   I'm using my inhaler frequently but it's not helping   It started today the coughing.    ?2. ONSET: "When did this breathing problem begin?"  ?    This morning.   I have a runny nose.   ?3. PATTERN "Does the difficult breathing come and go, or has it been constant since it started?"  ?    I'm having yellow and green sputum and coughing a lot.    ?4. SEVERITY: "How bad is your breathing?" (e.g., mild, moderate, severe)  ?  - MILD: No SOB at rest, mild SOB with walking, speaks normally in sentences, can lie down, no retractions, pulse < 100.  ?  - MODERATE: SOB at rest, SOB with minimal exertion and prefers to sit, cannot lie down flat, speaks in phrases, mild retractions, audible wheezing, pulse 100-120.  ?  - SEVERE: Very SOB at rest, speaks in single words, struggling to breathe, sitting hunched forward, retractions, pulse > 120  ?    When I do anything I'm short of breath and I'm wheezing.   I have congestion in my chest.   ?5. RECURRENT SYMPTOM: "Have you had difficulty breathing before?" If Yes, ask: "When was the last time?" and "What happened that time?"  ?    *No Answer* ?6. CARDIAC HISTORY: "Do you have any history of heart disease?" (e.g., heart attack, angina, bypass surgery, angioplasty)  ?    *No Answer* ?7. LUNG HISTORY: "Do you have any history of lung disease?"  (e.g., pulmonary embolus, asthma, emphysema) ?    asthma ?8. CAUSE: "What do you think is causing the breathing problem?"  ?    *No Answer* ?9. OTHER SYMPTOMS: "Do you have any other symptoms? (e.g., dizziness, runny nose, cough, chest pain, fever) ?    *No Answer* ?10. O2 SATURATION MONITOR:  "Do you use an oxygen saturation  monitor (pulse oximeter) at home?" If Yes, "What is your reading (oxygen level) today?" "What is your usual oxygen saturation reading?" (e.g., 95%) ?      *No Answer* ?11. PREGNANCY: "Is there any chance you are pregnant?" "When was your last menstrual period?" ?      *No Answer* ?12. TRAVEL: "Have you traveled out of the country in the last month?" (e.g., travel history, exposures) ?      *No Answer* ? ?Protocols used: Breathing Difficulty-A-AH ? ?

## 2021-08-17 ENCOUNTER — Other Ambulatory Visit: Payer: Self-pay

## 2021-08-17 ENCOUNTER — Encounter (HOSPITAL_COMMUNITY): Payer: Self-pay

## 2021-08-17 ENCOUNTER — Emergency Department (HOSPITAL_COMMUNITY)
Admission: EM | Admit: 2021-08-17 | Discharge: 2021-08-17 | Disposition: A | Discharge status: Home / Self Care | Payer: Medicaid Other | Attending: Emergency Medicine | Admitting: Emergency Medicine

## 2021-08-17 ENCOUNTER — Emergency Department (HOSPITAL_COMMUNITY): Payer: Medicaid Other

## 2021-08-17 DIAGNOSIS — N9489 Other specified conditions associated with female genital organs and menstrual cycle: Secondary | ICD-10-CM | POA: Insufficient documentation

## 2021-08-17 DIAGNOSIS — Z7951 Long term (current) use of inhaled steroids: Secondary | ICD-10-CM | POA: Insufficient documentation

## 2021-08-17 DIAGNOSIS — J4541 Moderate persistent asthma with (acute) exacerbation: Secondary | ICD-10-CM | POA: Insufficient documentation

## 2021-08-17 LAB — CBC WITH DIFFERENTIAL/PLATELET
Abs Immature Granulocytes: 0.07 10*3/uL (ref 0.00–0.07)
Basophils Absolute: 0 10*3/uL (ref 0.0–0.1)
Basophils Relative: 0 %
Eosinophils Absolute: 0.9 10*3/uL — ABNORMAL HIGH (ref 0.0–0.5)
Eosinophils Relative: 8 %
HCT: 39.5 % (ref 36.0–46.0)
Hemoglobin: 13.1 g/dL (ref 12.0–15.0)
Immature Granulocytes: 1 %
Lymphocytes Relative: 17 %
Lymphs Abs: 1.9 10*3/uL (ref 0.7–4.0)
MCH: 29.2 pg (ref 26.0–34.0)
MCHC: 33.2 g/dL (ref 30.0–36.0)
MCV: 88 fL (ref 80.0–100.0)
Monocytes Absolute: 0.9 10*3/uL (ref 0.1–1.0)
Monocytes Relative: 8 %
Neutro Abs: 7.4 10*3/uL (ref 1.7–7.7)
Neutrophils Relative %: 66 %
Platelets: 314 10*3/uL (ref 150–400)
RBC: 4.49 MIL/uL (ref 3.87–5.11)
RDW: 13.9 % (ref 11.5–15.5)
WBC: 11.2 10*3/uL — ABNORMAL HIGH (ref 4.0–10.5)
nRBC: 0 % (ref 0.0–0.2)

## 2021-08-17 LAB — BASIC METABOLIC PANEL
Anion gap: 7 (ref 5–15)
BUN: <5 mg/dL — ABNORMAL LOW (ref 6–20)
CO2: 25 mmol/L (ref 22–32)
Calcium: 8.4 mg/dL — ABNORMAL LOW (ref 8.9–10.3)
Chloride: 106 mmol/L (ref 98–111)
Creatinine, Ser: 0.8 mg/dL (ref 0.44–1.00)
GFR, Estimated: >60 mL/min (ref >60)
Glucose, Bld: 94 mg/dL (ref 70–99)
Potassium: 3.4 mmol/L — ABNORMAL LOW (ref 3.5–5.1)
Sodium: 138 mmol/L (ref 135–145)

## 2021-08-17 LAB — I-STAT BETA HCG BLOOD, ED (MC, WL, AP ONLY): I-stat hCG, quantitative: <5 m[IU]/mL (ref <5)

## 2021-08-17 LAB — BRAIN NATRIURETIC PEPTIDE: B Natriuretic Peptide: 18.5 pg/mL (ref 0.0–100.0)

## 2021-08-17 LAB — TROPONIN I (HIGH SENSITIVITY)
Troponin I (High Sensitivity): 5 ng/L (ref <18)
Troponin I (High Sensitivity): 3 ng/L (ref <18)

## 2021-08-17 MED ORDER — IPRATROPIUM-ALBUTEROL 0.5-2.5 (3) MG/3ML IN SOLN
3.0000 mL | Freq: Once | RESPIRATORY_TRACT | Status: AC
  Administered 2021-08-17: 3 mL via RESPIRATORY_TRACT
  Filled 2021-08-17: qty 3

## 2021-08-17 MED ORDER — PREDNISONE 10 MG PO TABS
ORAL_TABLET | Freq: Every day | ORAL | 0 refills | Status: DC
Start: 2021-08-17 — End: 2021-09-19
  Filled 2021-08-18 – 2021-08-25 (×2): qty 42, 12d supply, fill #0

## 2021-08-17 MED ORDER — METHYLPREDNISOLONE SODIUM SUCC 125 MG IJ SOLR
125.0000 mg | Freq: Once | INTRAMUSCULAR | Status: AC
  Administered 2021-08-17: 125 mg via INTRAVENOUS
  Filled 2021-08-17: qty 2

## 2021-08-17 MED ORDER — PREDNISONE 10 MG (21) PO TBPK: ORAL_TABLET | Freq: Every day | ORAL | 0 refills | Status: DC

## 2021-08-17 NOTE — Discharge Instructions (Addendum)
I have sent you a longer prescription for steroids.  Continue using your home inhalers. ? ?Follow-up with your primary care provider ? ?Return for new or worsening symptoms ?

## 2021-08-17 NOTE — ED Provider Triage Note (Signed)
Emergency Medicine Provider Triage Evaluation Note ? ?Wendy Morgan , a 46 y.o. female  was evaluated in triage.  Pt complains of wheezing and cough.  Patient has history of asthma.  Patient states that on Monday she began to feel short of breath.  She has used her rescue inhaler as much as 10 times daily over the past 2 days.  Called primary care who stated that her inhaler was only meant to be used twice per week.  Patient denies abdominal pain, nausea.  Patient does endorse coughing to the point of vomiting.  Endorses chest tightness with coughing. ? ?Review of Systems  ?Positive: Wheezing, cough ?Negative: Abdominal pain, nausea ? ?Physical Exam  ?BP 122/80 (BP Location: Right Arm)   Pulse 88   Temp 97.8 ?F (36.6 ?C) (Oral)   Resp 16   Ht 5\' 4"  (1.626 m)   Wt 90.7 kg   LMP 07/25/2021   SpO2 96%   BMI 34.33 kg/m?  ?Gen:   Awake, no distress   ?Resp:  Wheezes in all lung fields, scattered rhonchi ?MSK:   Moves extremities without difficulty  ?Other:   ? ?Medical Decision Making  ?Medically screening exam initiated at 1:08 PM.  Appropriate orders placed.  09/24/2021 LILIANN FILE was informed that the remainder of the evaluation will be completed by another provider, this initial triage assessment does not replace that evaluation, and the importance of remaining in the ED until their evaluation is complete. ? ? ?  ?Brett Fairy, PA-C ?08/17/21 1311 ? ?

## 2021-08-17 NOTE — ED Triage Notes (Signed)
Pt arrives c/o asthma exacerbation and wheezing. Pt states that she's been SOB and coughing to the point of vomiting since then. No relief with PRN inhaler.  ?

## 2021-08-17 NOTE — ED Provider Notes (Signed)
?[facility] EMERGENCY DEPARTMENT ?Provider Note ? ? ?CSN: [id number] ?Arrival date & time: 08/17/21  1229 ? ?  ?History ? ?Chief Complaint  ?Patient presents with  ? Asthma  ? ? ?Wendy Morgan is a 46 y.o. female with history of asthma here for evaluation of cough, chest tightness, shortness of breath and wheeze.   Seen 2 weeks ago for similar complaints.  Given inhaler as well as steroids.  Felt improved however symptoms resolved after steroid taper completed.  Spoke with PCP yesterday had her Qvar changed to Symbicort given she is uninsured.  She has no prior history of PE or DVT.  No recent surgery, mobilization or malignancy.  No hemoptysis.  Does feel tight in her chest however denies any overt chest pain.  Does not radiate.  Cough with non bloody sputum.  Does states she is dealing with some seasonal allergies currently taking Singulair and Zyrtec for some congestion, rhinorrhea.  No fever, emesis, back pain, abdominal pain, lower extremity swelling, weakness, redness, numbness. ? ?HPI ? ?  ? ?Home Medications ?Prior to Admission medications   ?Medication Sig Start Date End Date Taking? Authorizing Provider  ?predniSONE (STERAPRED UNI-PAK 21 TAB) 10 MG (21) TBPK tablet Take by mouth daily. Take 6 tabs by mouth daily  for 2 days, then 5 tabs for 2 days, then 4 tabs for 2 days, then 3 tabs for 2 days, 2 tabs for 2 days, then 1 tab by mouth daily for 2 days 08/17/21  Yes Petrona Wyeth A, PA-C  ?albuterol (VENTOLIN HFA) 108 (90 Base) MCG/ACT inhaler Inhale 1-2 puffs into the lungs every 6 (six) hours as needed for wheezing or shortness of breath. 08/16/21   Claiborne Rigg, NP  ?budesonide-formoterol [facility]) 160-4.5 MCG/ACT inhaler Inhale 2 puffs into the lungs 2 (two) times daily. NEEDS PASS 08/16/21   Claiborne Rigg, NP  ?cetirizine (ZYRTEC ALLERGY) 10 MG tablet Take 1 tablet (10 mg total) by mouth daily. 08/02/21 09/02/21  Redwine, Madison A, PA-C  ?fluticasone (FLONASE) 50 MCG/ACT nasal  spray Place 1 spray into both nostrils daily for 14 days. ?Patient not taking: Reported on 05/04/2021 03/13/21 04/11/22  Tanda Rockers A, DO  ?montelukast (SINGULAIR) 10 MG tablet Take 1 tablet (10 mg total) by mouth at bedtime. Continue zyrtec in the mornings 08/16/21   Claiborne Rigg, NP  ?   ? ?Allergies    ?Patient has no known allergies.   ? ?Review of Systems   ?Review of Systems  ?Constitutional: Negative.   ?HENT:  Positive for congestion, postnasal drip and rhinorrhea. Negative for ear pain, facial swelling, nosebleeds, sore throat, trouble swallowing and voice change.   ?Respiratory:  Positive for cough, chest tightness, shortness of breath and wheezing. Negative for apnea, choking and stridor.   ?Cardiovascular: Negative.   ?Gastrointestinal: Negative.   ?Genitourinary: Negative.   ?Musculoskeletal: Negative.   ?Skin: Negative.   ?Neurological: Negative.   ?All other systems reviewed and are negative. ? ?Physical Exam ?Updated Vital Signs ?BP 136/80 (BP Location: Right Arm)   Pulse 83   Temp 98 ?F (36.7 ?C) (Oral)   Resp 20   Ht 5\' 4"  (1.626 m)   Wt 90.7 kg   LMP 07/25/2021   SpO2 98%   BMI 34.33 kg/m?  ?Physical Exam ?Vitals and nursing note reviewed.  ?Constitutional:   ?   General: She is not in acute distress. ?   Appearance: She is well-developed. She is not ill-appearing, toxic-appearing or diaphoretic.  ?  HENT:  ?   Head: Normocephalic and atraumatic.  ?   Nose: Nose normal.  ?   Mouth/Throat:  ?   Mouth: Mucous membranes are moist.  ?Eyes:  ?   Pupils: Pupils are equal, round, and reactive to light.  ?Cardiovascular:  ?   Rate and Rhythm: Normal rate.  ?   Pulses: Normal pulses.  ?   Heart sounds: Normal heart sounds.  ?Pulmonary:  ?   Effort: No respiratory distress.  ?   Breath sounds: Wheezing present.  ?   Comments: Diffuse bilateral expiratory wheeze.  Speaks in soft voice, short sentences. ?Abdominal:  ?   General: Bowel sounds are normal. There is no distension.  ?   Palpations:  Abdomen is soft.  ?   Tenderness: There is no abdominal tenderness. There is no guarding or rebound.  ?Musculoskeletal:     ?   General: No swelling, tenderness, deformity or signs of injury. Normal range of motion.  ?   Cervical back: Normal range of motion.  ?   Right lower leg: No edema.  ?   Left lower leg: No edema.  ?   Comments: No bony tenderness, full range of motion, compartment soft.  Denna HaggardHomans' sign negative  ?Skin: ?   General: Skin is warm and dry.  ?   Capillary Refill: Capillary refill takes less than 2 seconds.  ?Neurological:  ?   General: No focal deficit present.  ?   Mental Status: She is alert and oriented to person, place, and time.  ?   Cranial Nerves: No cranial nerve deficit.  ?   Motor: No weakness.  ?   Comments: No obvious rash or lesions  ?Psychiatric:     ?   Mood and Affect: Mood normal.  ? ? ?ED Results / Procedures / Treatments   ?Labs ?(all labs ordered are listed, but only abnormal results are displayed) ?Labs Reviewed  ?BASIC METABOLIC PANEL - Abnormal; Notable for the following components:  ?    Result Value  ? Potassium 3.4 (*)   ? BUN <5 (*)   ? Calcium 8.4 (*)   ? All other components within normal limits  ?CBC WITH DIFFERENTIAL/PLATELET - Abnormal; Notable for the following components:  ? WBC 11.2 (*)   ? Eosinophils Absolute 0.9 (*)   ? All other components within normal limits  ?BRAIN NATRIURETIC PEPTIDE  ?I-STAT BETA HCG BLOOD, ED ([facility], [facility], [facility] ONLY)  ?TROPONIN I (HIGH SENSITIVITY)  ?TROPONIN I (HIGH SENSITIVITY)  ? ? ?EKG ?None ? ?Radiology ?DG Chest 2 View ? ?Result Date: 08/17/2021 ?CLINICAL DATA:  Shortness of breath history of asthma. EXAM: CHEST - 2 VIEW COMPARISON:  August 02, 2021 chest radiograph FINDINGS: The heart size and mediastinal contours are within normal limits. No focal consolidation. No pleural effusion. No pneumothorax. The visualized skeletal structures are unremarkable. IMPRESSION: No radiographic evidence of acute cardiopulmonary disease. Electronically  Signed   By: Maudry MayhewJeffrey  Waltz M.D.   On: 08/17/2021 13:43   ? ?Procedures ?Procedures  ? ? ?Medications Ordered in ED ?Medications  ?methylPREDNISolone sodium succinate (SOLU-MEDROL) 125 mg/2 mL injection 125 mg (125 mg Intravenous Given 08/17/21 1629)  ?ipratropium-albuterol (DUONEB) 0.5-2.5 (3) MG/3ML nebulizer solution 3 mL (3 mLs Nebulization Given 08/17/21 1629)  ?ipratropium-albuterol (DUONEB) 0.5-2.5 (3) MG/3ML nebulizer solution 3 mL (3 mLs Nebulization Given 08/17/21 1801)  ? ? ?ED Course/ Medical Decision Making/ A&P ?  ? ?46 year old here for evaluation of cough, shortness of breath and wheeze.  Seen 2 weeks ago for similar complaints, improved on steroids however upon completion symptoms returned.  Discussed with PCP yesterday had Qvar changed to Symbicort.  She is no clinical evidence of ETE on exam.  She is without tachycardia, tachypnea or hypoxia.  She is PERC negative, Wells criteria low risk.  Does have diffuse expiratory wheeze on exam.  Plan on steroids, DuoNeb and reassess ? ?Labs and imaging personally viewed and interpreted: ? ?CBC leukocytosis 11.2 ?Metabolic panel potassium 3.4 ?Pregnancy test negative ?BNP 18.5 ?DG chest without acute abnormality ?Trop 5--3 ? ?Patient reassessed. Breathing definitely improved.  Still has some mild wheeze.  Will give additional DuoNeb ? ?Patient reassessed.  Has clear breath sounds after 2 DuoNeb's.  At this time I low suspicion for acute ACS, PE, dissection, infectious process, pneumothorax, fluid overload.  I suspect she needs a longer course of steroids for her known asthma. Encouraged PRN albuterol at home and close PCP FU. ? ?The patient has been appropriately medically screened and/or stabilized in the ED. I have low suspicion for any other emergent medical condition which would require further screening, evaluation or treatment in the ED or require inpatient management. ? ?Patient is hemodynamically stable and in no acute distress.  Patient able to ambulate  in department prior to ED.  Evaluation does not show acute pathology that would require ongoing or additional emergent interventions while in the emergency department or further inpatient treatment.  I have discussed th

## 2021-08-18 ENCOUNTER — Other Ambulatory Visit: Payer: Self-pay

## 2021-08-18 MED ORDER — SUCRALFATE 1 G PO TABS
ORAL_TABLET | ORAL | 0 refills | Status: DC
  Filled 2021-08-18: qty 60, 15d supply, fill #0

## 2021-08-18 MED ORDER — DOXYCYCLINE HYCLATE 100 MG PO TABS
ORAL_TABLET | ORAL | 0 refills | Status: DC
  Filled 2021-08-18: qty 14, 7d supply, fill #0

## 2021-08-18 MED ORDER — ESOMEPRAZOLE MAGNESIUM 40 MG PO CPDR
DELAYED_RELEASE_CAPSULE | ORAL | 0 refills | Status: DC
  Filled 2021-08-18: qty 30, 30d supply, fill #0

## 2021-08-18 MED ORDER — ONDANSETRON HCL 4 MG PO TABS
ORAL_TABLET | ORAL | 0 refills | Status: DC
  Filled 2021-08-18: qty 12, 3d supply, fill #0

## 2021-08-23 ENCOUNTER — Institutional Professional Consult (permissible substitution): Payer: Medicaid Other | Admitting: Pulmonary Disease

## 2021-08-25 ENCOUNTER — Other Ambulatory Visit: Payer: Self-pay

## 2021-08-29 ENCOUNTER — Other Ambulatory Visit: Payer: Self-pay

## 2021-09-01 ENCOUNTER — Other Ambulatory Visit: Payer: Self-pay

## 2021-09-02 ENCOUNTER — Other Ambulatory Visit: Payer: Self-pay

## 2021-09-07 ENCOUNTER — Other Ambulatory Visit: Payer: Self-pay

## 2021-09-13 ENCOUNTER — Other Ambulatory Visit: Payer: Self-pay

## 2021-09-19 ENCOUNTER — Emergency Department (HOSPITAL_COMMUNITY)
Admission: EM | Admit: 2021-09-19 | Discharge: 2021-09-19 | Disposition: A | Discharge status: Home / Self Care | Payer: Medicaid Other | Attending: Emergency Medicine | Admitting: Emergency Medicine

## 2021-09-19 ENCOUNTER — Encounter (HOSPITAL_COMMUNITY): Payer: Self-pay

## 2021-09-19 ENCOUNTER — Other Ambulatory Visit: Payer: Self-pay

## 2021-09-19 DIAGNOSIS — Z7951 Long term (current) use of inhaled steroids: Secondary | ICD-10-CM | POA: Insufficient documentation

## 2021-09-19 DIAGNOSIS — J4541 Moderate persistent asthma with (acute) exacerbation: Secondary | ICD-10-CM | POA: Insufficient documentation

## 2021-09-19 MED ORDER — PREDNISONE 20 MG PO TABS
60.0000 mg | ORAL_TABLET | Freq: Once | ORAL | Status: AC
  Administered 2021-09-19: 60 mg via ORAL
  Filled 2021-09-19: qty 3

## 2021-09-19 MED ORDER — ALBUTEROL SULFATE (2.5 MG/3ML) 0.083% IN NEBU
2.5000 mg | INHALATION_SOLUTION | RESPIRATORY_TRACT | 0 refills | Status: DC | PRN
  Filled 2021-09-19: qty 90, 5d supply, fill #0

## 2021-09-19 MED ORDER — PREDNISONE 50 MG PO TABS
50.0000 mg | ORAL_TABLET | Freq: Every day | ORAL | 0 refills | Status: DC
  Filled 2021-09-19: qty 5, 5d supply, fill #0

## 2021-09-19 MED ORDER — IPRATROPIUM-ALBUTEROL 0.5-2.5 (3) MG/3ML IN SOLN
3.0000 mL | Freq: Once | RESPIRATORY_TRACT | Status: AC
  Administered 2021-09-19: 3 mL via RESPIRATORY_TRACT
  Filled 2021-09-19: qty 3

## 2021-09-19 NOTE — ED Triage Notes (Signed)
Pt presents to ED from home with c/o asthma exacerbation that began yesterday. Pt reports using at home nebulizer and inhaler yesterday without relief. Denies pain.

## 2021-09-19 NOTE — ED Provider Notes (Signed)
[facility]-EMERGENCY DEPT Provider Note   CSN: [id number] Arrival date & time: 09/19/21  0537     History  Chief Complaint  Patient presents with   Asthma    Wendy Morgan is a 46 y.o. female.  The history is provided by the patient.  Asthma She has history of asthma and comes in because of cough and difficulty breathing which started yesterday.  She has used her albuterol inhaler without relief.  She states that she had a sinus infection with some greenish rhinorrhea and thinks that that was what triggered it.  She denies fever chills or sweats.   Home Medications Prior to Admission medications   Medication Sig Start Date End Date Taking? Authorizing Provider  albuterol (VENTOLIN HFA) 108 (90 Base) MCG/ACT inhaler Inhale 1-2 puffs into the lungs every 6 (six) hours as needed for wheezing or shortness of breath. 08/16/21   Claiborne Rigg, NP  budesonide-formoterol [facility]) 160-4.5 MCG/ACT inhaler Inhale 2 puffs into the lungs 2 (two) times daily. NEEDS PASS 08/16/21   Claiborne Rigg, NP  cetirizine (ZYRTEC ALLERGY) 10 MG tablet Take 1 tablet (10 mg total) by mouth daily. 08/02/21 09/02/21  Redwine, Madison A, PA-C  fluticasone (FLONASE) 50 MCG/ACT nasal spray Place 1 spray into both nostrils daily for 14 days. Patient not taking: Reported on 05/04/2021 03/13/21 04/11/22  Tanda Rockers A, DO  montelukast (SINGULAIR) 10 MG tablet Take 1 tablet (10 mg total) by mouth at bedtime. Continue zyrtec in the mornings 08/16/21   Claiborne Rigg, NP  predniSONE (DELTASONE) 10 MG tablet Take by mouth daily.Take 6 tabs by mouth daily  for 2 days, then 5 tabs for 2 days, then 4 tabs for 2 days, then 3 tabs for 2 days, 2 tabs for 2 days, then 1 tab by mouth daily for 2 days 08/17/21   Henderly, Britni A, PA-C  esomeprazole (NEXIUM) 40 MG capsule TAKE 1 CAPSULE BY MOUTH DAILY 08/17/21 08/18/21  Charlynne Pander, MD  sucralfate (CARAFATE) 1 g tablet TAKE 1 TABLET BY MOUTH 4 TIMES  DAILY-WITH MEALS AND AT BEDTIME 08/17/21 08/18/21  Charlynne Pander, MD      Allergies    Patient has no known allergies.    Review of Systems   Review of Systems  All other systems reviewed and are negative.  Physical Exam Updated Vital Signs BP (!) 158/95 (BP Location: Left Arm)   Pulse 86   Temp 98.2 F (36.8 C) (Oral)   Resp 20   Ht 5\' 4"  (1.626 m)   Wt 90.7 kg   SpO2 94%   BMI 34.33 kg/m  Physical Exam Vitals and nursing note reviewed.  46 year old female, resting comfortably and in no acute distress. Vital signs are significant for elevated blood pressure. Oxygen saturation is 94%, which is normal. Head is normocephalic and atraumatic. PERRLA, EOMI. Oropharynx is clear.  There is no sinus tenderness.  There is no nasal drainage and no edema of the turbinates. Neck is nontender and supple without adenopathy or JVD. Back is nontender and there is no CVA tenderness. Lungs have moderate expiratory wheezes without rales or rhonchi. Chest is nontender. Heart has regular rate and rhythm without murmur. Abdomen is soft, flat, nontender . Extremities have no cyanosis or edema, full range of motion is present. Skin is warm and dry without rash. Neurologic: Mental status is normal, cranial nerves are intact, moves all extremities equally.  ED Results / Procedures / Treatments  Labs (all labs ordered are listed, but only abnormal results are displayed) Labs Reviewed - No data to display  EKG None  Radiology No results found.  Procedures Procedures    Medications Ordered in ED Medications  predniSONE (DELTASONE) tablet 60 mg (has no administration in time range)  ipratropium-albuterol (DUONEB) 0.5-2.5 (3) MG/3ML nebulizer solution 3 mL (has no administration in time range)    ED Course/ Medical Decision Making/ A&P                           Medical Decision Making Risk Prescription drug management.   Exacerbation of asthma.  Probable viral URI as a trigger.   No clinical findings to suggest sinusitis.  Old records reviewed, and she has multiple ED visits for asthma.  She is given a dose of prednisone and nebulizer treatment with albuterol and ipratropium.  Following nebulizer treatment, patient feels much better and feels she is ready for discharge.  On exam, faint wheezing is still present, but patient is doing well enough that I think she can safely go home.  She is requesting a prescription for solution for her nebulizer.  She is discharged with prescription for prednisone and albuterol solution for nebulizer.  Final Clinical Impression(s) / ED Diagnoses Final diagnoses:  Moderate persistent asthma with exacerbation    Rx / DC Orders ED Discharge Orders          Ordered    albuterol (PROVENTIL) (2.5 MG/3ML) 0.083% nebulizer solution  Every 4 hours PRN        09/19/21 0634    predniSONE (DELTASONE) 50 MG tablet  Daily        09/19/21 0634              Dione Booze, MD 09/19/21 [phone]

## 2021-09-26 ENCOUNTER — Encounter (HOSPITAL_COMMUNITY): Payer: Self-pay | Admitting: Emergency Medicine

## 2021-09-26 ENCOUNTER — Emergency Department (HOSPITAL_COMMUNITY)
Admission: EM | Admit: 2021-09-26 | Discharge: 2021-09-26 | Disposition: A | Discharge status: Home / Self Care | Payer: Self-pay | Attending: Student | Admitting: Student

## 2021-09-26 ENCOUNTER — Other Ambulatory Visit: Payer: Self-pay

## 2021-09-26 DIAGNOSIS — Z7952 Long term (current) use of systemic steroids: Secondary | ICD-10-CM | POA: Insufficient documentation

## 2021-09-26 DIAGNOSIS — T39395A Adverse effect of other nonsteroidal anti-inflammatory drugs [NSAID], initial encounter: Secondary | ICD-10-CM | POA: Insufficient documentation

## 2021-09-26 DIAGNOSIS — R600 Localized edema: Secondary | ICD-10-CM | POA: Insufficient documentation

## 2021-09-26 DIAGNOSIS — J45909 Unspecified asthma, uncomplicated: Secondary | ICD-10-CM | POA: Insufficient documentation

## 2021-09-26 DIAGNOSIS — R609 Edema, unspecified: Secondary | ICD-10-CM

## 2021-09-26 DIAGNOSIS — T887XXA Unspecified adverse effect of drug or medicament, initial encounter: Secondary | ICD-10-CM

## 2021-09-26 DIAGNOSIS — Z7951 Long term (current) use of inhaled steroids: Secondary | ICD-10-CM | POA: Insufficient documentation

## 2021-09-26 LAB — CBG MONITORING, ED: Glucose-Capillary: 179 mg/dL — ABNORMAL HIGH (ref 70–99)

## 2021-09-26 NOTE — ED Provider Notes (Signed)
[facility]-EMERGENCY DEPT Provider Note   CSN: [id number] Arrival date & time: 09/26/21  1321     History  Chief Complaint  Patient presents with   Leg Swelling    Wendy Morgan is a 46 y.o. female with no significant past medical history who presents with concern for lower leg edema since yesterday morning.  Patient has history of asthma, was just treated for an exacerbation with steroids.  Wendy Morgan reports that Wendy Morgan just finished the steroid course yesterday.  Wendy Morgan denies any recent travel, unilateral swelling, shortness of breath, chest pain.  Wendy Morgan denies any history of heart failure, kidney failure, liver failure.  Patient does not have any pain with the legs.  HPI     Home Medications Prior to Admission medications   Medication Sig Start Date End Date Taking? Authorizing Provider  albuterol (PROVENTIL) (2.5 MG/3ML) 0.083% nebulizer solution Take 3 mLs (2.5 mg total) by nebulization every 4 (four) hours as needed for wheezing or shortness of breath. 09/19/21   Dione Booze, MD  albuterol (VENTOLIN HFA) 108 (90 Base) MCG/ACT inhaler Inhale 1-2 puffs into the lungs every 6 (six) hours as needed for wheezing or shortness of breath. 08/16/21   Claiborne Rigg, NP  budesonide-formoterol [facility]) 160-4.5 MCG/ACT inhaler Inhale 2 puffs into the lungs 2 (two) times daily. NEEDS PASS 08/16/21   Claiborne Rigg, NP  cetirizine (ZYRTEC ALLERGY) 10 MG tablet Take 1 tablet (10 mg total) by mouth daily. 08/02/21 09/02/21  Redwine, Madison A, PA-C  fluticasone (FLONASE) 50 MCG/ACT nasal spray Place 1 spray into both nostrils daily for 14 days. Patient not taking: Reported on 05/04/2021 03/13/21 04/11/22  Tanda Rockers A, DO  montelukast (SINGULAIR) 10 MG tablet Take 1 tablet (10 mg total) by mouth at bedtime. Continue zyrtec in the mornings 08/16/21   Claiborne Rigg, NP  predniSONE (DELTASONE) 50 MG tablet Take 1 tablet (50 mg total) by mouth daily. 09/19/21   Dione Booze, MD   esomeprazole (NEXIUM) 40 MG capsule TAKE 1 CAPSULE BY MOUTH DAILY 08/17/21 08/18/21  Charlynne Pander, MD  sucralfate (CARAFATE) 1 g tablet TAKE 1 TABLET BY MOUTH 4 TIMES DAILY-WITH MEALS AND AT BEDTIME 08/17/21 08/18/21  Charlynne Pander, MD      Allergies    Patient has no known allergies.    Review of Systems   Review of Systems  All other systems reviewed and are negative.   Physical Exam Updated Vital Signs BP 117/88 (BP Location: Left Arm)   Pulse 76   Temp 97.9 F (36.6 C) (Oral)   Resp 16   Ht 5\' 4"  (1.626 m)   Wt 90.7 kg   LMP 09/15/2021   SpO2 99%   BMI 34.33 kg/m  Physical Exam Vitals and nursing note reviewed.  Constitutional:      General: Wendy Morgan is not in acute distress.    Appearance: Normal appearance.  HENT:     Head: Normocephalic and atraumatic.  Eyes:     General:        Right eye: No discharge.        Left eye: No discharge.  Cardiovascular:     Rate and Rhythm: Normal rate and regular rhythm.  Pulmonary:     Effort: Pulmonary effort is normal. No respiratory distress.  Musculoskeletal:        General: No deformity.     Comments: 1+ nonpitting edema bilateral lower extremities.  No tenderness palpation of the calf.  Negative  Denna Haggard' sign bilaterally.  Skin:    General: Skin is warm and dry.  Neurological:     Mental Status: Wendy Morgan is alert and oriented to person, place, and time.  Psychiatric:        Mood and Affect: Mood normal.        Behavior: Behavior normal.     ED Results / Procedures / Treatments   Labs (all labs ordered are listed, but only abnormal results are displayed) Labs Reviewed  CBG MONITORING, ED - Abnormal; Notable for the following components:      Result Value   Glucose-Capillary 179 (*)    All other components within normal limits    EKG None  Radiology No results found.  Procedures Procedures    Medications Ordered in ED Medications - No data to display  ED Course/ Medical Decision Making/ A&P                            Medical Decision Making  This an overall well-appearing patient who presents with concern for leg swelling.  Wendy Morgan just recently completed a course of steroids.  Wendy Morgan does also report that Wendy Morgan spends a lot of time on her feet.  Discussed with patient that this is an expected side effect of prednisone.  Patient not having any chest pain, respiratory distress, signs of DVT, Wendy Morgan is negative by [facility] criteria for PE.  Wendy Morgan has no history of heart failure, liver failure, kidney failure.  Wendy Morgan does not have a history of alcohol abuse.  Discussed patient I have low concern for other causes of peripheral extremity edema especially in context of known recent prednisone usage.  Patient encouraged to use some compression stockings to help with swelling, and Wendy Morgan is discharged stable condition at this time. Final Clinical Impression(s) / ED Diagnoses Final diagnoses:  Peripheral edema  Medication side effect    Rx / DC Orders ED Discharge Orders     None         [place] 09/26/21 1651    Kommor, Wyn Forster, MD 09/27/21 1610

## 2021-09-26 NOTE — ED Triage Notes (Signed)
Patient presents with bilateral lower leg edema since yesterday morning. States this has happened in the past after being on steroids. Patient reports just finishing a round of steroids for asthma exacerbation.

## 2021-09-26 NOTE — Discharge Instructions (Addendum)
As we discussed I think that your leg swelling is secondary to the tumor that you just finished taking.  If it becomes a persistent problem when you are no longer taking steroids I do recommend that you return for reevaluation for things like heart failure, liver failure, kidney failure.  You have no previous history of these conditions, are not having any persistent chest pain or shortness of breath I have low concern for these conditions today.  I recommend you use some compression stockings in the short-term to help get the fluid off your legs, I would not recommend a fluid pill at this time less becomes a more persistent problem, and you are developing signs of something like heart failure or kidney failure.  If you begin to develop chest pain or shortness of breath please return for further evaluation.

## 2021-09-26 NOTE — ED Notes (Signed)
CBG done was in error. Arm band on wrong pt

## 2021-10-03 NOTE — Progress Notes (Deleted)
Synopsis: Referred for *** by Cristopher Peru, PA-C  Subjective:   PATIENT ID: Wendy Morgan GENDER: female DOB: [birth year], MRN: [id number]  No chief complaint on file.  46yF with history of severe persistent asthma, CRS previously followed by Dr. Delford Field  Otherwise pertinent review of systems is negative.  Past Medical History:  Diagnosis Date   Asthma    Bronchiectasis with acute exacerbation (HCC)    Class 1 obesity 04/11/2021     Family History  Problem Relation Age of Onset   Healthy Mother    Healthy Father    Kidney disease Neg Hx    Anxiety disorder Neg Hx    Depression Neg Hx      Past Surgical History:  Procedure Laterality Date   TUBAL LIGATION      Social History   Socioeconomic History   Marital status: Single    Spouse name: Not on file   Number of children: Not on file   Years of education: Not on file   Highest education level: Not on file  Occupational History   Not on file  Tobacco Use   Smoking status: Former    Types: Cigarettes    Quit date: 85    Years since quitting: 25.4   Smokeless tobacco: Never  Vaping Use   Vaping Use: Never used  Substance and Sexual Activity   Alcohol use: No   Drug use: No   Sexual activity: Yes    Birth control/protection: None  Other Topics Concern   Not on file  Social History Narrative   Not on file   Social Determinants of Health   Financial Resource Strain: Not on file  Food Insecurity: Not on file  Transportation Needs: Not on file  Physical Activity: Not on file  Stress: Not on file  Social Connections: Not on file  Intimate Partner Violence: Not on file     No Known Allergies   Outpatient Medications Prior to Visit  Medication Sig Dispense Refill   albuterol (PROVENTIL) (2.5 MG/3ML) 0.083% nebulizer solution Take 3 mLs (2.5 mg total) by nebulization every 4 (four) hours as needed for wheezing or shortness of breath. 90 mL 0   albuterol (VENTOLIN HFA) 108 (90 Base) MCG/ACT  inhaler Inhale 1-2 puffs into the lungs every 6 (six) hours as needed for wheezing or shortness of breath. 18 g 1   budesonide-formoterol (SYMBICORT) 160-4.5 MCG/ACT inhaler Inhale 2 puffs into the lungs 2 (two) times daily. NEEDS PASS 10.2 g 3   cetirizine (ZYRTEC ALLERGY) 10 MG tablet Take 1 tablet (10 mg total) by mouth daily. 30 tablet 0   fluticasone (FLONASE) 50 MCG/ACT nasal spray Place 1 spray into both nostrils daily for 14 days. (Patient not taking: Reported on 05/04/2021) 15.8 mL 0   montelukast (SINGULAIR) 10 MG tablet Take 1 tablet (10 mg total) by mouth at bedtime. Continue zyrtec in the mornings 30 tablet 3   predniSONE (DELTASONE) 50 MG tablet Take 1 tablet (50 mg total) by mouth daily. 5 tablet 0   No facility-administered medications prior to visit.       Objective:   Physical Exam:  General appearance: 46 y.o., female, NAD, conversant  Eyes: anicteric sclerae; PERRL, tracking appropriately HENT: NCAT; MMM Neck: Trachea midline; no lymphadenopathy, no JVD Lungs: CTAB, no crackles, no wheeze, with normal respiratory effort CV: RRR, no murmur  Abdomen: Soft, non-tender; non-distended, BS present  Extremities: No peripheral edema, warm Skin: Normal turgor and texture; no rash Psych:  Appropriate affect Neuro: Alert and oriented to person and place, no focal deficit     There were no vitals filed for this visit.   on *** LPM *** RA BMI Readings from Last 3 Encounters:  09/26/21 34.33 kg/m  09/19/21 34.33 kg/m  08/17/21 34.33 kg/m   Wt Readings from Last 3 Encounters:  09/26/21 200 lb (90.7 kg)  09/19/21 200 lb (90.7 kg)  08/17/21 200 lb (90.7 kg)     CBC    Component Value Date/Time   WBC 11.2 (H) 08/17/2021 1325   RBC 4.49 08/17/2021 1325   HGB 13.1 08/17/2021 1325   HGB 14.3 06/09/2019 1051   HCT 39.5 08/17/2021 1325   HCT 41.8 06/09/2019 1051   PLT 314 08/17/2021 1325   PLT 342 06/09/2019 1051   MCV 88.0 08/17/2021 1325   MCV 87 06/09/2019  1051   MCH 29.2 08/17/2021 1325   MCHC 33.2 08/17/2021 1325   RDW 13.9 08/17/2021 1325   RDW 13.3 06/09/2019 1051   LYMPHSABS 1.9 08/17/2021 1325   MONOABS 0.9 08/17/2021 1325   EOSABS 0.9 (H) 08/17/2021 1325   EOSABS 0.3 07/22/2019 0932   BASOSABS 0.0 08/17/2021 1325    Eos [phone]  IgE 67 in 2021  Chest Imaging: CT Chest 07/25/19 reviewed by me with borderline bronchiectasis  CXR 08/17/21 reviewed by me stable  Pulmonary Functions Testing Results:     No data to display          FeNO: ***  Pathology: ***  Echocardiogram: ***  Heart Catheterization: ***    Assessment & Plan:    Plan:      Omar Person, MD [facility] 10/03/2021 6:26 PM

## 2021-10-05 ENCOUNTER — Institutional Professional Consult (permissible substitution): Payer: Medicaid Other | Admitting: Student

## 2021-10-20 ENCOUNTER — Encounter (HOSPITAL_COMMUNITY): Payer: Self-pay

## 2021-10-20 ENCOUNTER — Other Ambulatory Visit: Payer: Self-pay

## 2021-10-20 ENCOUNTER — Emergency Department (HOSPITAL_COMMUNITY): Payer: Medicaid Other

## 2021-10-20 ENCOUNTER — Emergency Department (HOSPITAL_COMMUNITY)
Admission: EM | Admit: 2021-10-20 | Discharge: 2021-10-21 | Disposition: A | Discharge status: Home / Self Care | Payer: Medicaid Other | Attending: Emergency Medicine | Admitting: Emergency Medicine

## 2021-10-20 DIAGNOSIS — Z7952 Long term (current) use of systemic steroids: Secondary | ICD-10-CM | POA: Insufficient documentation

## 2021-10-20 DIAGNOSIS — J45909 Unspecified asthma, uncomplicated: Secondary | ICD-10-CM | POA: Insufficient documentation

## 2021-10-20 DIAGNOSIS — R0789 Other chest pain: Secondary | ICD-10-CM | POA: Insufficient documentation

## 2021-10-20 DIAGNOSIS — R002 Palpitations: Secondary | ICD-10-CM | POA: Insufficient documentation

## 2021-10-20 DIAGNOSIS — D72829 Elevated white blood cell count, unspecified: Secondary | ICD-10-CM | POA: Insufficient documentation

## 2021-10-20 DIAGNOSIS — Z7951 Long term (current) use of inhaled steroids: Secondary | ICD-10-CM | POA: Insufficient documentation

## 2021-10-20 DIAGNOSIS — R600 Localized edema: Secondary | ICD-10-CM | POA: Insufficient documentation

## 2021-10-20 LAB — CBC
HCT: 40.1 % (ref 36.0–46.0)
Hemoglobin: 13.4 g/dL (ref 12.0–15.0)
MCH: 29.1 pg (ref 26.0–34.0)
MCHC: 33.4 g/dL (ref 30.0–36.0)
MCV: 87 fL (ref 80.0–100.0)
Platelets: 364 10*3/uL (ref 150–400)
RBC: 4.61 MIL/uL (ref 3.87–5.11)
RDW: 14.6 % (ref 11.5–15.5)
WBC: 11.1 10*3/uL — ABNORMAL HIGH (ref 4.0–10.5)
nRBC: 0 % (ref 0.0–0.2)

## 2021-10-20 LAB — BASIC METABOLIC PANEL
Anion gap: 6 (ref 5–15)
BUN: 12 mg/dL (ref 6–20)
CO2: 25 mmol/L (ref 22–32)
Calcium: 8.9 mg/dL (ref 8.9–10.3)
Chloride: 109 mmol/L (ref 98–111)
Creatinine, Ser: 1 mg/dL (ref 0.44–1.00)
GFR, Estimated: >60 mL/min (ref >60)
Glucose, Bld: 98 mg/dL (ref 70–99)
Potassium: 3.6 mmol/L (ref 3.5–5.1)
Sodium: 140 mmol/L (ref 135–145)

## 2021-10-20 LAB — I-STAT BETA HCG BLOOD, ED (MC, WL, AP ONLY): I-stat hCG, quantitative: <5 m[IU]/mL (ref <5)

## 2021-10-20 LAB — TROPONIN I (HIGH SENSITIVITY)
Troponin I (High Sensitivity): <2 ng/L (ref <18)
Troponin I (High Sensitivity): <2 ng/L (ref <18)

## 2021-10-20 MED ORDER — KETOROLAC TROMETHAMINE 15 MG/ML IJ SOLN
15.0000 mg | Freq: Once | INTRAMUSCULAR | Status: AC
Start: 2021-10-20 — End: 2021-10-20
  Administered 2021-10-20: 15 mg via INTRAVENOUS
  Filled 2021-10-20: qty 1

## 2021-10-20 NOTE — ED Triage Notes (Signed)
Pt c/o intermittent chest pain and tightness x3 days. Pt states she has been having swelling to her ankles x2 weeks. Pt seen in [facility] ED 6/12 with same complaint of lower extremity swelling.

## 2021-10-20 NOTE — ED Provider Notes (Addendum)
[facility]-EMERGENCY DEPT Provider Note   CSN: [id number] Arrival date & time: 10/20/21  2103     History  Chief Complaint  Patient presents with   Chest Pain    Wendy Morgan is a 46 y.o. female.  46 year old female presents today for evaluation of substernal chest pain ongoing for about 3 days.  States pain lasts a couple minutes and is associated with palpitations.  Denies lightheadedness, nausea, vomiting, diaphoresis, abdominal pain, or radiation of the chest pain.  Denies history of cardiac disease.  Is unable to identify any inciting factors.  States when she stands up and ambulates the pain goes away.  Denies recent illness.  Also endorses bilateral lower extremity edema however that has been going on for about 2 months and states is likely related to her prednisone use.  Denies any leg pain, recent travel, pleuritic chest pain, exogenous estrogen use, recent surgery, shortness of breath.  She does have history of asthma.  Denies any significant family history of cardiac disease.  The history is provided by the patient. No language interpreter was used.       Home Medications Prior to Admission medications   Medication Sig Start Date End Date Taking? Authorizing Provider  albuterol (PROVENTIL) (2.5 MG/3ML) 0.083% nebulizer solution Take 3 mLs (2.5 mg total) by nebulization every 4 (four) hours as needed for wheezing or shortness of breath. 09/19/21   Dione Booze, MD  albuterol (VENTOLIN HFA) 108 (90 Base) MCG/ACT inhaler Inhale 1-2 puffs into the lungs every 6 (six) hours as needed for wheezing or shortness of breath. 08/16/21   Claiborne Rigg, NP  budesonide-formoterol [facility]) 160-4.5 MCG/ACT inhaler Inhale 2 puffs into the lungs 2 (two) times daily. NEEDS PASS 08/16/21   Claiborne Rigg, NP  cetirizine (ZYRTEC ALLERGY) 10 MG tablet Take 1 tablet (10 mg total) by mouth daily. 08/02/21 09/02/21  Redwine, Madison A, PA-C  fluticasone (FLONASE) 50 MCG/ACT  nasal spray Place 1 spray into both nostrils daily for 14 days. Patient not taking: Reported on 05/04/2021 03/13/21 04/11/22  Tanda Rockers A, DO  montelukast (SINGULAIR) 10 MG tablet Take 1 tablet (10 mg total) by mouth at bedtime. Continue zyrtec in the mornings 08/16/21   Claiborne Rigg, NP  predniSONE (DELTASONE) 50 MG tablet Take 1 tablet (50 mg total) by mouth daily. 09/19/21   Dione Booze, MD  esomeprazole (NEXIUM) 40 MG capsule TAKE 1 CAPSULE BY MOUTH DAILY 08/17/21 08/18/21  Charlynne Pander, MD  sucralfate (CARAFATE) 1 g tablet TAKE 1 TABLET BY MOUTH 4 TIMES DAILY-WITH MEALS AND AT BEDTIME 08/17/21 08/18/21  Charlynne Pander, MD      Allergies    Patient has no known allergies.    Review of Systems   Review of Systems  Constitutional:  Negative for chills and fever.  Respiratory:  Negative for cough and shortness of breath.   Cardiovascular:  Positive for chest pain, palpitations and leg swelling.  Gastrointestinal:  Negative for abdominal pain, nausea and vomiting.  Neurological:  Negative for syncope and light-headedness.  All other systems reviewed and are negative.   Physical Exam Updated Vital Signs BP 124/65   Pulse 66   Temp 97.8 F (36.6 C) (Oral)   Resp (!) 23   Ht 5\' 4"  (1.626 m)   Wt 99.8 kg   LMP 09/15/2021   SpO2 97%   BMI 37.76 kg/m  Physical Exam Vitals and nursing note reviewed.  Constitutional:  General: She is not in acute distress.    Appearance: Normal appearance. She is not ill-appearing.  HENT:     Head: Normocephalic and atraumatic.     Nose: Nose normal.  Eyes:     General: No scleral icterus.    Extraocular Movements: Extraocular movements intact.     Conjunctiva/sclera: Conjunctivae normal.  Cardiovascular:     Rate and Rhythm: Normal rate and regular rhythm.     Pulses: Normal pulses.  Pulmonary:     Effort: Pulmonary effort is normal. No respiratory distress.     Breath sounds: Normal breath sounds. No wheezing or rales.   Abdominal:     General: There is no distension.     Tenderness: There is no abdominal tenderness.  Musculoskeletal:        General: Normal range of motion.     Cervical back: Normal range of motion.     Right lower leg: Edema (1+ pitting edema) present.     Left lower leg: Edema (1+ pitting edema) present.  Skin:    General: Skin is warm and dry.  Neurological:     General: No focal deficit present.     Mental Status: She is alert. Mental status is at baseline.     ED Results / Procedures / Treatments   Labs (all labs ordered are listed, but only abnormal results are displayed) Labs Reviewed  CBC - Abnormal; Notable for the following components:      Result Value   WBC 11.1 (*)    All other components within normal limits  BASIC METABOLIC PANEL  I-STAT BETA HCG BLOOD, ED ([facility], [facility], [facility] ONLY)  TROPONIN I (HIGH SENSITIVITY)  TROPONIN I (HIGH SENSITIVITY)    EKG None  Radiology DG Chest 2 View  Result Date: 10/20/2021 CLINICAL DATA:  Chest pain. EXAM: CHEST - 2 VIEW COMPARISON:  Radiograph 08/17/2021 FINDINGS: The cardiomediastinal contours are normal. The lungs are clear. Pulmonary vasculature is normal. No consolidation, pleural effusion, or pneumothorax. No acute osseous abnormalities are seen. IMPRESSION: Negative radiographs of the chest. Electronically Signed   By: Narda Rutherford M.D.   On: 10/20/2021 21:44    Procedures Procedures    Medications Ordered in ED Medications  ketorolac (TORADOL) 15 MG/ML injection 15 mg (has no administration in time range)    ED Course/ Medical Decision Making/ A&P                           Medical Decision Making Amount and/or Complexity of Data Reviewed Labs: ordered. Radiology: ordered.  Risk Prescription drug management.   Medical Decision Making / ED Course   This patient presents to the ED for concern of chest pain, this involves an extensive number of treatment options, and is a complaint that carries with it a  high risk of complications and morbidity.  The differential diagnosis includes ACS, PE, pneumonia, MSK pain, GERD  MDM: 46 year old female presents today for evaluation of chest pain of 3 days duration.  Pain is intermittent and fleeting.  Describes this as substernal without radiation.  Without associated symptoms other than occasional palpitations.  Has bilateral lower extremity edema however this has been ongoing for 2 months.  Denies pain in the legs.  Low risk for PE on Wells criteria.  PERC negative.  CBC with mild leukocytosis otherwise without anemia.  BMP unremarkable.  Troponin negative x2.  Chest x-ray without acute cardiopulmonary process.  EKG without acute ischemic changes.  Toradol given with improvement in pain.  She had slight pain during my initial interview which resolved with Toradol.  Will give naproxen.  Return precautions discussed.  Discussed importance of follow-up with PCP.  Cardiology referral given.  Low heart score.   Lab Tests: -I ordered, reviewed, and interpreted labs.   The pertinent results include:   Labs Reviewed  CBC - Abnormal; Notable for the following components:      Result Value   WBC 11.1 (*)    All other components within normal limits  BASIC METABOLIC PANEL  I-STAT BETA HCG BLOOD, ED ([facility], [facility], [facility] ONLY)  TROPONIN I (HIGH SENSITIVITY)  TROPONIN I (HIGH SENSITIVITY)      EKG  EKG Interpretation  Date/Time:  Thursday October 20 2021 23:12:18 EDT Ventricular Rate:  62 PR Interval:  204 QRS Duration: 99 QT Interval:  423 QTC Calculation: 430 R Axis:   61 Text Interpretation: Sinus rhythm Borderline prolonged PR interval Confirmed by Palumbo, April ([id number]) on 10/20/2021 11:16:22 PM         Imaging Studies ordered: I ordered imaging studies including chest x-ray I independently visualized and interpreted imaging. I agree with the radiologist interpretation   Medicines ordered and prescription drug management: Meds ordered this encounter   Medications   ketorolac (TORADOL) 15 MG/ML injection 15 mg    -I have reviewed the patients home medicines and have made adjustments as needed  Reevaluation: After the interventions noted above, I reevaluated the patient and found that they have :improved  Co morbidities that complicate the patient evaluation  Past Medical History:  Diagnosis Date   Asthma    Bronchiectasis with acute exacerbation (HCC)    Class 1 obesity 04/11/2021      Dispostion: Patient is appropriate for discharge.  Discharged in stable condition.  Return precautions discussed.  Patient voices understanding and is in agreement with plan.  Cardiology referral given.  Final Clinical Impression(s) / ED Diagnoses Final diagnoses:  Atypical chest pain    Rx / DC Orders ED Discharge Orders          Ordered    Ambulatory referral to Cardiology       Comments: If you have not heard from the Cardiology office within the next 72 hours please call [phone].   10/21/21 0011    naproxen (NAPROSYN) 375 MG tablet  2 times daily        10/21/21 0011              Marita Kansas, PA-C 10/21/21 0012    Marita Kansas, PA-C 10/21/21 0014    Palumbo, April, MD 10/21/21 1761

## 2021-10-21 ENCOUNTER — Other Ambulatory Visit: Payer: Self-pay

## 2021-10-21 MED ORDER — NAPROXEN 375 MG PO TABS
375.0000 mg | ORAL_TABLET | Freq: Two times a day (BID) | ORAL | 0 refills | Status: DC
  Filled 2021-10-21 – 2021-12-05 (×2): qty 20, 10d supply, fill #0

## 2021-10-21 NOTE — Progress Notes (Signed)
Synopsis: Referred for severe persistent asthma by Cristopher Peru, PA-C  Subjective:   PATIENT ID: Wendy Morgan GENDER: female DOB: [birth year], MRN: [id number]  Chief Complaint  Patient presents with   Pulmonary Consult    Referred by Mertha Baars, PA.  Pt c/o cough and SOB "for years"- gets worse when she is unable to use her inhalers- currently on Symbicort. She has had some prod cough with green sputum. She just finished pred taper. She has had some swelling in her feet and ankles for the past 3 wks.    45yF with history of severe persistent asthma on symbicort 160 2 puffs twice daily without spacer, singulair, zyrtec, CRS taking flonase previously followed by Dr. Delford Field.   Finished prednisone taper a couple weeks ago. 6+ times has needed prednisone which does relieve her dyspnea. Dyspnea is most prominent symptom when asthma active, does also have some cough with it, productive. She has been on symbicort for the last 3 weeks. Had been on Qvar.   She does have reflux but only when she takes prednisone.   Otherwise pertinent review of systems is negative.  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 Very limited remote smoking.   Past Medical History:  Diagnosis Date   Asthma    Bronchiectasis with acute exacerbation (HCC)    Class 1 obesity 04/11/2021     Family History  Problem Relation Age of Onset   Healthy Mother    Healthy Father    Kidney disease Neg Hx    Anxiety disorder Neg Hx    Depression Neg Hx      Past Surgical History:  Procedure Laterality Date   TUBAL LIGATION      Social History   Socioeconomic History   Marital status: Single    Spouse name: Not on file   Number of children: Not on file   Years of education: Not on file   Highest education level: Not on file  Occupational History   Not on file  Tobacco Use   Smoking status: Former    Packs/day: 0.25    Years: 6.00     Total pack years: 1.50    Types: Cigarettes    Quit date: 75    Years since quitting: 25.5   Smokeless tobacco: Never  Vaping Use   Vaping Use: Never used  Substance and Sexual Activity   Alcohol use: No   Drug use: No   Sexual activity: Yes    Birth control/protection: None  Other Topics Concern   Not on file  Social History Narrative   Not on file   Social Determinants of Health   Financial Resource Strain: Not on file  Food Insecurity: Not on file  Transportation Needs: Not on file  Physical Activity: Not on file  Stress: Not on file  Social Connections: Not on file  Intimate Partner Violence: Not on file     No Known Allergies   Outpatient Medications Prior to Visit  Medication Sig Dispense Refill   albuterol (PROVENTIL) (2.5 MG/3ML) 0.083% nebulizer solution Take 3 mLs (2.5 mg total) by nebulization every 4 (four) hours as needed for wheezing or shortness of breath. 90 mL 0   albuterol (VENTOLIN HFA) 108 (90 Base) MCG/ACT inhaler Inhale 1-2 puffs into the lungs every 6 (six) hours as needed for wheezing or shortness of breath. 18 g 1   budesonide-formoterol (SYMBICORT)  160-4.5 MCG/ACT inhaler Inhale 2 puffs into the lungs 2 (two) times daily. NEEDS PASS 10.2 g 3   cetirizine (ZYRTEC ALLERGY) 10 MG tablet Take 1 tablet (10 mg total) by mouth daily. (Patient taking differently: Take 10 mg by mouth daily as needed.) 30 tablet 0   fluticasone (FLONASE) 50 MCG/ACT nasal spray Place 1 spray into both nostrils daily as needed for allergies or rhinitis.     montelukast (SINGULAIR) 10 MG tablet Take 1 tablet (10 mg total) by mouth at bedtime. Continue zyrtec in the mornings 30 tablet 3   naproxen (NAPROSYN) 375 MG tablet Take 1 tablet (375 mg total) by mouth 2 (two) times daily. 20 tablet 0   fluticasone (FLONASE) 50 MCG/ACT nasal spray Place 1 spray into both nostrils daily for 14 days. (Patient not taking: Reported on 05/04/2021) 15.8 mL 0   predniSONE (DELTASONE) 50 MG tablet  Take 1 tablet (50 mg total) by mouth daily. 5 tablet 0   No facility-administered medications prior to visit.       Objective:   Physical Exam:  General appearance: 46 y.o., female, NAD, conversant  Eyes: anicteric sclerae; PERRL, tracking appropriately HENT: NCAT; MMM Neck: Trachea midline; no lymphadenopathy, no JVD Lungs: faint inspiratory wheeze on right, with normal respiratory effort CV: RRR, no murmur  Abdomen: Soft, non-tender; non-distended, BS present  Extremities: No peripheral edema, warm Skin: Normal turgor and texture; no rash Psych: Appropriate affect Neuro: Alert and oriented to person and place, no focal deficit     Vitals:   10/24/21 1500  BP: 118/80  Pulse: 62  Temp: 98.2 F (36.8 C)  TempSrc: Oral  SpO2: 97%  Weight: 239 lb (108.4 kg)  Height: 5\' 4"  (1.626 m)   97% on RA BMI Readings from Last 3 Encounters:  10/24/21 41.02 kg/m  10/20/21 37.76 kg/m  09/26/21 34.33 kg/m   Wt Readings from Last 3 Encounters:  10/24/21 239 lb (108.4 kg)  10/20/21 220 lb (99.8 kg)  09/26/21 200 lb (90.7 kg)     CBC    Component Value Date/Time   WBC 11.1 (H) 10/20/2021 2114   RBC 4.61 10/20/2021 2114   HGB 13.4 10/20/2021 2114   HGB 14.3 06/09/2019 1051   HCT 40.1 10/20/2021 2114   HCT 41.8 06/09/2019 1051   PLT 364 10/20/2021 2114   PLT 342 06/09/2019 1051   MCV 87.0 10/20/2021 2114   MCV 87 06/09/2019 1051   MCH 29.1 10/20/2021 2114   MCHC 33.4 10/20/2021 2114   RDW 14.6 10/20/2021 2114   RDW 13.3 06/09/2019 1051   LYMPHSABS 1.9 08/17/2021 1325   MONOABS 0.9 08/17/2021 1325   EOSABS 0.9 (H) 08/17/2021 1325   EOSABS 0.3 07/22/2019 0932   BASOSABS 0.0 08/17/2021 1325    Eos [phone]  IgE 67 in 2021  Chest Imaging: CT Chest 07/25/19 reviewed by me with borderline bronchiectasis  CXR 08/17/21 reviewed by me stable  Pulmonary Functions Testing Results:     No data to display            Assessment & Plan:   # Severe persistent  asthma, recent exacerbation # Eosinophilic asthma # Borderline central bronchiectasis If not just due to eosinophilic asthma consider ABPA, allergic asthma. Some gerd but only when on steroid, does also have CRS/PND which could be contributory.   Plan: - labs today, PFTs next visit - stop symbicort, try breztri 2 puffs twice daily with spacer let me know if you prefer it. Once you're  out of breztri would resume symbicort unless we continue breztri prescription. - if you don't hear from me in a week with lab results and plan for biologic medicine for asthma or treatment of ABPA (allergic bronchopulmonary aspergillosis) then send a message or call [phone] - stay on singulair, zyrtec, flonase  - albuterol as needed - see you in 3 months or sooner if need be!    Omar Person, MD [facility] 10/24/2021 3:30 PM

## 2021-10-21 NOTE — Discharge Instructions (Signed)
Your work-up today was overall reassuring.  Your cardiac enzymes were normal.  EKG did not show any concerns.  Chest x-ray was normal.  We discussed that you are low risk for PE.  If you have any worsening symptoms please return to the emergency room for evaluation.  Otherwise follow-up with your primary care provider.  I have given you cardiology referral if you do not hear from the cardiology clinic by Monday please call to schedule this appointment.

## 2021-10-24 ENCOUNTER — Encounter: Payer: Self-pay | Admitting: Student

## 2021-10-24 ENCOUNTER — Ambulatory Visit (INDEPENDENT_AMBULATORY_CARE_PROVIDER_SITE_OTHER): Payer: Self-pay | Admitting: Student

## 2021-10-24 VITALS — BP 118/80 | HR 62 | Temp 98.2°F | Ht 64.0 in | Wt 239.0 lb

## 2021-10-24 DIAGNOSIS — J4550 Severe persistent asthma, uncomplicated: Secondary | ICD-10-CM

## 2021-10-24 MED ORDER — BREZTRI AEROSPHERE 160-9-4.8 MCG/ACT IN AERO: 2.0000 | INHALATION_SPRAY | Freq: Two times a day (BID) | RESPIRATORY_TRACT | 0 refills | Status: AC

## 2021-10-24 NOTE — Patient Instructions (Addendum)
-   labs today - stop symbicort, try breztri 2 puffs twice daily with spacer let me know if you prefer it. Once you're out of breztri would resume symbicort unless we continue breztri prescription. - if you don't hear from me in a week with lab results and plan for biologic medicine for asthma or treatment of ABPA (allergic bronchopulmonary aspergillosis) then send a message or call [phone] - stay on singulair, zyrtec  - albuterol as needed - see you in 3 months or sooner if need be!

## 2021-10-25 LAB — IGE: IgE (Immunoglobulin E), Serum: 53 kU/L (ref <=114)

## 2021-10-28 ENCOUNTER — Other Ambulatory Visit: Payer: Self-pay

## 2021-10-28 LAB — ASPERGILLUS IGE PANEL
A. Amstel/Glaucu Class Interp: 0
A. Flavus Class Interp: 0
A. Fumigatus Class Interp: 0
A. Nidulans Class Interp: 0
A. Niger Class Interp: 0
A. Versicolor Class Interp: 0
Aspergillus amstel/glaucu IgE*: <0.35 kU/L (ref <0.35)
Aspergillus flavus IgE: <0.35 kU/L (ref <0.35)
Aspergillus fumigatus IgE: <0.1 kU/L (ref <0.35)
Aspergillus nidulans IgE: <0.35 kU/L (ref <0.35)
Aspergillus niger IgE: <0.1 kU/L (ref <0.35)
Aspergillus versicolor IgE: <0.1 kU/L (ref <0.35)

## 2021-10-31 ENCOUNTER — Encounter: Payer: Self-pay | Admitting: Student

## 2021-11-01 NOTE — Telephone Encounter (Signed)
Received the following message from patient:   "Hello Doctor, question my results are back so do you recommend the shots or something .please let me know at your earliest convenience thank you for your time."  She wants to know if you have decided on a biologic for her based on her test results. Dr. Thora Lance, can you please advise? Thanks!

## 2021-11-02 NOTE — Telephone Encounter (Signed)
Will set up for nucala

## 2021-11-08 ENCOUNTER — Encounter: Payer: Self-pay | Admitting: Nurse Practitioner

## 2021-11-09 ENCOUNTER — Ambulatory Visit: Payer: Medicaid Other | Admitting: Nurse Practitioner

## 2021-11-10 ENCOUNTER — Ambulatory Visit: Payer: Self-pay | Admitting: Physician Assistant

## 2021-11-10 NOTE — Progress Notes (Deleted)
Patient ID: Wendy Morgan, female   DOB: 1975/05/05, 46 y.o.   MRN: [id number]  After ED visit 10/20/2021 for CP  This patient presents to the ED for concern of chest pain, this involves an extensive number of treatment options, and is a complaint that carries with it a high risk of complications and morbidity.  The differential diagnosis includes ACS, PE, pneumonia, MSK pain, GERD   MDM: 46 year old female presents today for evaluation of chest pain of 3 days duration.  Pain is intermittent and fleeting.  Describes this as substernal without radiation.  Without associated symptoms other than occasional palpitations.  Has bilateral lower extremity edema however this has been ongoing for 2 months.  Denies pain in the legs.  Low risk for PE on Wells criteria.  PERC negative.  CBC with mild leukocytosis otherwise without anemia.  BMP unremarkable.  Troponin negative x2.  Chest x-ray without acute cardiopulmonary process.  EKG without acute ischemic changes.  Toradol given with improvement in pain.  She had slight pain during my initial interview which resolved with Toradol.  Will give naproxen.  Return precautions discussed.  Discussed importance of follow-up with PCP.  Cardiology referral given.  Low heart score.

## 2021-11-19 ENCOUNTER — Emergency Department (HOSPITAL_COMMUNITY)
Admission: EM | Admit: 2021-11-19 | Discharge: 2021-11-19 | Disposition: A | Discharge status: Home / Self Care | Payer: Self-pay | Attending: Emergency Medicine | Admitting: Emergency Medicine

## 2021-11-19 ENCOUNTER — Emergency Department (HOSPITAL_COMMUNITY): Payer: Medicaid Other

## 2021-11-19 ENCOUNTER — Encounter (HOSPITAL_COMMUNITY): Payer: Self-pay | Admitting: Emergency Medicine

## 2021-11-19 DIAGNOSIS — Z7951 Long term (current) use of inhaled steroids: Secondary | ICD-10-CM | POA: Insufficient documentation

## 2021-11-19 DIAGNOSIS — J45909 Unspecified asthma, uncomplicated: Secondary | ICD-10-CM | POA: Insufficient documentation

## 2021-11-19 DIAGNOSIS — M79652 Pain in left thigh: Secondary | ICD-10-CM | POA: Insufficient documentation

## 2021-11-19 DIAGNOSIS — R600 Localized edema: Secondary | ICD-10-CM | POA: Insufficient documentation

## 2021-11-19 MED ORDER — IBUPROFEN 800 MG PO TABS
800.0000 mg | ORAL_TABLET | Freq: Once | ORAL | Status: AC
  Administered 2021-11-19: 800 mg via ORAL
  Filled 2021-11-19: qty 1

## 2021-11-19 NOTE — Discharge Instructions (Signed)
Please return to the ED with any new or worsening signs or symptoms Please read attached guide concerning muscle pain Please follow-up with your PCP for further management of her bilateral lower extremity swelling.  Please continue using compression stockings, elevating her legs in the meantime. Please continue taking ibuprofen/Tylenol as needed for pain.  Please also begin taking muscle relaxers.  Please be aware that muscle relaxers will make you woozy, do not drive or operate heavy machinery while under the influence of this medication.

## 2021-11-19 NOTE — ED Provider Notes (Signed)
[facility]-EMERGENCY DEPT Provider Note   CSN: [id number] Arrival date & time: 11/19/21  0865     History  Chief Complaint  Patient presents with   Leg Pain    Lashana OTELIA HETTINGER is a 46 y.o. female with medical history of bronchiectasis with acute exacerbation, asthma.  The patient presents to ED for evaluation of left thigh pain.  Patient reports that beginning yesterday she began experiencing left thigh pain that is nonradiating.  Patient denies any recent trauma, heavy lifting, excessive exercise or event to account for this pain.  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  Patient also has secondary complaint of bilateral lower extremity swelling.  The patient states that has been ongoing for 2 months.  The patient states that she was initially seen in this ED for this complaint and advised that it was most likely due to her recent prednisone usage however the patient states that after discontinuing use of prednisone her bilateral lower extremity edema persisted.  Patient denies any numbness or tingling, nausea or vomiting, fevers, body aches or chills, chest pain, shortness of breath.  The patient denies any recent travel, exogenous hormone use, unilateral leg swelling, hemoptysis, history of blood clots.  Patient denies history of renal failure, liver failure, heart failure.   Leg Pain Associated symptoms: no fever        Home Medications Prior to Admission medications   Medication Sig Start Date End Date Taking? Authorizing Provider  albuterol (PROVENTIL) (2.5 MG/3ML) 0.083% nebulizer solution Take 3 mLs (2.5 mg total) by nebulization every 4 (four) hours as needed for wheezing or shortness of breath. 09/19/21   Dione Booze, MD  albuterol (VENTOLIN HFA) 108 (90 Base) MCG/ACT inhaler Inhale 1-2 puffs into the lungs every 6 (six) hours as needed for wheezing or shortness of breath. 08/16/21   Claiborne Rigg, NP  Budeson-Glycopyrrol-Formoterol (BREZTRI AEROSPHERE) 160-9-4.8 MCG/ACT AERO Inhale 2 puffs into the lungs in the morning and at bedtime. 10/24/21   Omar Person, MD  budesonide-formoterol [facility]) 160-4.5 MCG/ACT inhaler Inhale 2 puffs into the lungs 2 (two) times daily. NEEDS PASS 08/16/21   Claiborne Rigg, NP  cetirizine (ZYRTEC ALLERGY) 10 MG tablet Take 1 tablet (10 mg total) by mouth daily. Patient taking differently: Take 10 mg by mouth daily as needed. 08/02/21 10/24/21  Redwine, Madison A, PA-C  fluticasone (FLONASE) 50 MCG/ACT nasal spray Place 1 spray into both nostrils daily as needed for allergies or rhinitis.    [provider]  montelukast (SINGULAIR) 10 MG tablet Take 1 tablet (10 mg total) by mouth at bedtime. Continue zyrtec in the mornings 08/16/21   Claiborne Rigg, NP  naproxen (NAPROSYN) 375 MG tablet Take 1 tablet (375 mg total) by mouth 2 (two) times daily. 10/21/21   Marita Kansas, PA-C  esomeprazole (NEXIUM) 40 MG capsule TAKE 1 CAPSULE BY MOUTH DAILY 08/17/21 08/18/21  Charlynne Pander, MD  sucralfate (CARAFATE) 1 g tablet TAKE 1 TABLET BY MOUTH 4 TIMES DAILY-WITH MEALS AND AT BEDTIME 08/17/21 08/18/21  Charlynne Pander, MD      Allergies    Patient has no known allergies.    Review of Systems   Review of Systems  Constitutional:  Negative for chills and fever.  Respiratory:  Negative for shortness of breath.   Cardiovascular:  Positive for leg swelling. Negative for chest pain.  Gastrointestinal:  Negative for  nausea and vomiting.  Musculoskeletal:  Positive for myalgias.  Neurological:  Negative for numbness.  All other systems reviewed and are negative.   Physical Exam Updated Vital Signs BP 133/89 (BP Location: Left Arm)   Pulse 71   Temp 97.9 F (36.6 C) (Oral)   Resp 16   Ht 5\' 4"  (1.626 m)   Wt 99.3 kg   LMP 11/11/2021   SpO2 100%   BMI 37.59 kg/m  Physical Exam Vitals and nursing note reviewed.  Constitutional:      General: She  is not in acute distress.    Appearance: Normal appearance. She is not ill-appearing, toxic-appearing or diaphoretic.  HENT:     Head: Normocephalic and atraumatic.     Nose: Nose normal. No congestion.     Mouth/Throat:     Mouth: Mucous membranes are moist.     Pharynx: Oropharynx is clear.  Eyes:     Extraocular Movements: Extraocular movements intact.     Conjunctiva/sclera: Conjunctivae normal.     Pupils: Pupils are equal, round, and reactive to light.  Cardiovascular:     Rate and Rhythm: Normal rate and regular rhythm.  Pulmonary:     Effort: Pulmonary effort is normal.     Breath sounds: Normal breath sounds. No wheezing.  Abdominal:     General: Abdomen is flat. Bowel sounds are normal.     Palpations: Abdomen is soft.     Tenderness: There is no abdominal tenderness.  Musculoskeletal:     Cervical back: Normal range of motion and neck supple. No tenderness.     Left upper leg: Tenderness present. No swelling, edema, deformity or lacerations.     Right lower leg: 1+ Edema present.     Left lower leg: 1+ Edema present.     Comments: Patient left upper thigh palpated without findings of crepitus, deformity.  The patient has full range of motion to her hip and knee on the left side.  There is no overlying skin change to suggest cellulitis or herpes zoster.  Patient has full sensation to this area of her thigh.  Skin:    General: Skin is warm and dry.     Capillary Refill: Capillary refill takes less than 2 seconds.  Neurological:     Mental Status: She is alert and oriented to person, place, and time.     ED Results / Procedures / Treatments   Labs (all labs ordered are listed, but only abnormal results are displayed) Labs Reviewed - No data to display  EKG None  Radiology DG Femur Min 2 Views Left  Result Date: 11/19/2021 CLINICAL DATA:  Left thigh pain with bilateral ankle swelling EXAM: LEFT FEMUR 2 VIEWS COMPARISON:  None Available. FINDINGS: There is no  evidence of fracture or other focal bone lesions. Soft tissues are unremarkable. IMPRESSION: Negative. Electronically Signed   By: 01/19/2022 M.D.   On: 11/19/2021 09:16    Procedures Procedures   Medications Ordered in ED Medications  ibuprofen (ADVIL) tablet 800 mg (800 mg Oral Given 11/19/21 01/19/22)    ED Course/ Medical Decision Making/ A&P                           Medical Decision Making Amount and/or Complexity of Data Reviewed Radiology: ordered.  Risk Prescription drug management.   46 year old female presents to ED for evaluation of left upper thigh pain since yesterday.  Please see HPI for further details.  On examination, the patient is afebrile and nontachycardic.  The patient lung sounds are clear bilaterally, she is not hypoxic.  Patient abdomen soft and compressible in all 4 quadrants.  The patient left upper thigh was examined without findings of overlying skin change, deformity.  There is tenderness when palpated however this tenderness is nonradiating.  Patient denies any recent trauma or event to account for this pain.  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  Patient worked up utilizing the following imaging study interpreted by me personally: - DG left femur shows no fracture, soft tissue swelling or deformity.  Patient will be discharged home on short course of muscle relaxers, advised to continue taking ibuprofen/Tylenol for pain relief.  The patient was advised to follow-up with her PCP for further management.  Consideration was given to starting this patient on short course, low-dose Lasix however after discussion with my attending we decided against this.  The patient states that she was able to control her bilateral lower extremity swelling with stockings as well as elevation and so we have advised her to continue taking the steps.  Patient had all her questions answered prior to  discharge.  The patient was given return precautions which she voiced understanding with.  The patient is stable at this time for discharge home  Final Clinical Impression(s) / ED Diagnoses Final diagnoses:  Left thigh pain    Rx / DC Orders ED Discharge Orders     None         Clent Ridges 11/19/21 1033    Gloris Manchester, MD 11/21/21 1819

## 2021-11-19 NOTE — ED Triage Notes (Signed)
Pt endorses left thigh pain that hurts when pressure is applied. Also having bilateral ankle swelling for awhile. Stopped taking steroids a month ago.

## 2021-11-23 ENCOUNTER — Ambulatory Visit: Payer: Medicaid Other | Admitting: Cardiovascular Disease

## 2021-11-23 ENCOUNTER — Telehealth: Payer: Self-pay

## 2021-11-23 ENCOUNTER — Other Ambulatory Visit (HOSPITAL_COMMUNITY): Payer: Self-pay

## 2021-11-23 NOTE — Telephone Encounter (Signed)
Received pt portion of New start paperwork for [facility], provider portion is absent. Application that was used is out-of-date, so will need to acquire updated forms. Will update as we work through the benefits process.

## 2021-11-25 NOTE — Telephone Encounter (Signed)
Provider portion placed in Dr. Lind Guest box for signature, will await it's return.

## 2021-12-05 ENCOUNTER — Other Ambulatory Visit: Payer: Self-pay

## 2021-12-05 ENCOUNTER — Other Ambulatory Visit (HOSPITAL_COMMUNITY): Payer: Self-pay

## 2021-12-06 ENCOUNTER — Other Ambulatory Visit: Payer: Self-pay

## 2021-12-06 ENCOUNTER — Encounter: Payer: Self-pay | Admitting: Cardiovascular Disease

## 2021-12-06 ENCOUNTER — Other Ambulatory Visit (HOSPITAL_COMMUNITY): Payer: Self-pay

## 2021-12-07 NOTE — Telephone Encounter (Signed)
Submitted Patient Assistance Application to [facility] [place] for NUCALA along with provider portion, PA and income documents. Will update patient when we receive a response.  Fax# [phone] Phone# [phone] 

## 2021-12-10 ENCOUNTER — Other Ambulatory Visit (HOSPITAL_COMMUNITY): Payer: Self-pay

## 2021-12-12 ENCOUNTER — Encounter: Payer: Self-pay | Admitting: Student

## 2021-12-12 ENCOUNTER — Other Ambulatory Visit: Payer: Self-pay

## 2021-12-13 ENCOUNTER — Other Ambulatory Visit: Payer: Self-pay

## 2021-12-19 DIAGNOSIS — R072 Precordial pain: Secondary | ICD-10-CM | POA: Insufficient documentation

## 2021-12-19 NOTE — Progress Notes (Deleted)
Cardiology Office Note   Date:  12/19/2021   ID:  Wendy Morgan, DOB [birth year], MRN [id number]  PCP:  Wendy Rigg, NP  Cardiologist:   None Referring:  ***  No chief complaint on file.     History of Present Illness: Wendy Morgan is a 46 y.o. female who presents for evaluation of chest pain.  ***       Past Medical History:  Diagnosis Date   Asthma    Bronchiectasis with acute exacerbation (HCC)    Class 1 obesity 04/11/2021    Past Surgical History:  Procedure Laterality Date   TUBAL LIGATION       Current Outpatient Medications  Medication Sig Dispense Refill   albuterol (PROVENTIL) (2.5 MG/3ML) 0.083% nebulizer solution Take 3 mLs (2.5 mg total) by nebulization every 4 (four) hours as needed for wheezing or shortness of breath. 90 mL 0   albuterol (VENTOLIN HFA) 108 (90 Base) MCG/ACT inhaler Inhale 1-2 puffs into the lungs every 6 (six) hours as needed for wheezing or shortness of breath. 18 g 1   Budeson-Glycopyrrol-Formoterol (BREZTRI AEROSPHERE) 160-9-4.8 MCG/ACT AERO Inhale 2 puffs into the lungs in the morning and at bedtime. 5.9 g 0   budesonide-formoterol (SYMBICORT) 160-4.5 MCG/ACT inhaler Inhale 2 puffs into the lungs 2 (two) times daily. NEEDS PASS 10.2 g 3   cetirizine (ZYRTEC ALLERGY) 10 MG tablet Take 1 tablet (10 mg total) by mouth daily. (Patient taking differently: Take 10 mg by mouth daily as needed.) 30 tablet 0   fluticasone (FLONASE) 50 MCG/ACT nasal spray Place 1 spray into both nostrils daily as needed for allergies or rhinitis.     montelukast (SINGULAIR) 10 MG tablet Take 1 tablet by mouth at bedtime. Continue zyrtec in the mornings 30 tablet 3   naproxen (NAPROSYN) 375 MG tablet Take 1 tablet by mouth 2 times daily. 20 tablet 0   No current facility-administered medications for this visit.    Allergies:   Patient has no known allergies.    Social History:  The patient  reports that she quit smoking about 25 years ago. Her  smoking use included cigarettes. She has a 1.50 pack-year smoking history. She has never used smokeless tobacco. She reports that she does not drink alcohol and does not use drugs.   Family History:  The patient's ***family history includes Healthy in her father and mother.    ROS:  Please see the history of present illness.   Otherwise, review of systems are positive for {NONE DEFAULTED:18576}.   All other systems are reviewed and negative.    PHYSICAL EXAM: VS:  There were no vitals taken for this visit. , BMI There is no height or weight on file to calculate BMI. GENERAL:  Well appearing HEENT:  Pupils equal round and reactive, fundi not visualized, oral mucosa unremarkable NECK:  No jugular venous distention, waveform within normal limits, carotid upstroke brisk and symmetric, no bruits, no thyromegaly LYMPHATICS:  No cervical, inguinal adenopathy LUNGS:  Clear to auscultation bilaterally BACK:  No CVA tenderness CHEST:  Unremarkable HEART:  PMI not displaced or sustained,S1 and S2 within normal limits, no S3, no S4, no clicks, no rubs, *** murmurs ABD:  Flat, positive bowel sounds normal in frequency in pitch, no bruits, no rebound, no guarding, no midline pulsatile mass, no hepatomegaly, no splenomegaly EXT:  2 plus pulses throughout, no edema, no cyanosis no clubbing SKIN:  No rashes no nodules NEURO:  Cranial nerves II through  XII grossly intact, motor grossly intact throughout PSYCH:  Cognitively intact, oriented to person place and time    EKG:  EKG {ACTION; IS/IS RJJ:[id number]} ordered today. The ekg ordered today demonstrates ***   Recent Labs: 04/11/2021: ALT 14 08/17/2021: B Natriuretic Peptide 18.5 10/20/2021: BUN 12; Creatinine, Ser 1.00; Hemoglobin 13.4; Platelets 364; Potassium 3.6; Sodium 140    Lipid Panel    Component Value Date/Time   CHOL 174 06/09/2019 1051   TRIG 74 06/09/2019 1051   HDL 59 06/09/2019 1051   CHOLHDL 2.9 06/09/2019 1051   LDLCALC 101 (H)  06/09/2019 1051      Wt Readings from Last 3 Encounters:  11/19/21 219 lb (99.3 kg)  10/24/21 239 lb (108.4 kg)  10/20/21 220 lb (99.8 kg)      Other studies Reviewed: Additional studies/ records that were reviewed today include: ***. Review of the above records demonstrates:  Please see elsewhere in the note.  ***   ASSESSMENT AND PLAN:  Chest pain:  ***   Current medicines are reviewed at length with the patient today.  The patient {ACTIONS; HAS/DOES NOT HAVE:19233} concerns regarding medicines.  The following changes have been made:  {PLAN; NO CHANGE:13088:s}  Labs/ tests ordered today include: *** No orders of the defined types were placed in this encounter.    Disposition:   FU with ***    Signed, Rollene Rotunda, MD  12/19/2021 11:50 AM    [facility]

## 2021-12-20 ENCOUNTER — Other Ambulatory Visit (HOSPITAL_COMMUNITY): Payer: Self-pay

## 2021-12-20 ENCOUNTER — Ambulatory Visit: Payer: Medicaid Other | Attending: Cardiology | Admitting: Cardiology

## 2021-12-20 DIAGNOSIS — R072 Precordial pain: Secondary | ICD-10-CM | POA: Insufficient documentation

## 2021-12-27 NOTE — Telephone Encounter (Addendum)
Received a fax from  [facility] [place] regarding an approval for NUCALA patient assistance from 12/27/21 to 12/27/22. Patient can be scheduled for new start visit  Phone number: [phone]  Chesley Mires, PharmD, MPH, BCPS, CPP Clinical Pharmacist (Rheumatology and Pulmonology)

## 2021-12-28 ENCOUNTER — Encounter: Payer: Self-pay | Admitting: *Deleted

## 2021-12-28 NOTE — Telephone Encounter (Signed)
N[place]ala new start scheduled for 12/29/21. Provided the patient with the phone number for the patient assistance program.

## 2021-12-29 ENCOUNTER — Telehealth: Payer: Self-pay | Admitting: Pharmacist

## 2021-12-29 ENCOUNTER — Ambulatory Visit: Payer: Medicaid Other | Admitting: Pharmacist

## 2021-12-29 DIAGNOSIS — J4550 Severe persistent asthma, uncomplicated: Secondary | ICD-10-CM

## 2021-12-29 DIAGNOSIS — Z7189 Other specified counseling: Secondary | ICD-10-CM

## 2021-12-29 MED ORDER — NUCALA 100 MG/ML ~~LOC~~ SOAJ: 100.0000 mg | SUBCUTANEOUS | 5 refills | Status: DC

## 2021-12-29 NOTE — Progress Notes (Signed)
HPI Patient presents today to [facility] with her mother to see pharmacy team for [facility] new start.  Past medical history includes severe persistent asthma. Has had 6 ED visits and one asthma-related hospitalization in 2023 alone. Many prednisone tapers as well which she attributes her weight gain to.  She states the [place] did help with her symptoms when she received sample but continues on Symbicort. She is interested in staying on [place] if possible but lack of insurance remains a barrier.  She is slightly nervous regarding self-administering injection. She does not have any experience with injectable medications  Respiratory Medications Current regimen: Symbicort 160-4.38mcg (2 puffs twice daily), montelukast 10mg  nightly Tried in past: Breztri sample provided at last OV  OBJECTIVE No Known Allergies  Outpatient Encounter Medications as of 12/29/2021  Medication Sig Note   albuterol (PROVENTIL) (2.5 MG/3ML) 0.083% nebulizer solution Take 3 mLs (2.5 mg total) by nebulization every 4 (four) hours as needed for wheezing or shortness of breath.    albuterol (VENTOLIN HFA) 108 (90 Base) MCG/ACT inhaler Inhale 1-2 puffs into the lungs every 6 (six) hours as needed for wheezing or shortness of breath.    Budeson-Glycopyrrol-Formoterol (BREZTRI AEROSPHERE) 160-9-4.8 MCG/ACT AERO Inhale 2 puffs into the lungs in the morning and at bedtime.    budesonide-formoterol (SYMBICORT) 160-4.5 MCG/ACT inhaler Inhale 2 puffs into the lungs 2 (two) times daily. NEEDS PASS    cetirizine (ZYRTEC ALLERGY) 10 MG tablet Take 1 tablet (10 mg total) by mouth daily. (Patient taking differently: Take 10 mg by mouth daily as needed.)    fluticasone (FLONASE) 50 MCG/ACT nasal spray Place 1 spray into both nostrils daily as needed for allergies or rhinitis.    montelukast (SINGULAIR) 10 MG tablet Take 1 tablet by mouth at bedtime. Continue zyrtec in the mornings    naproxen (NAPROSYN) 375 MG tablet Take 1  tablet by mouth 2 times daily.    [DISCONTINUED] esomeprazole (NEXIUM) 40 MG capsule TAKE 1 CAPSULE BY MOUTH DAILY 08/18/2021: wrong patient   [DISCONTINUED] sucralfate (CARAFATE) 1 g tablet TAKE 1 TABLET BY MOUTH 4 TIMES DAILY-WITH MEALS AND AT BEDTIME 08/18/2021: wrong patient   No facility-administered encounter medications on file as of 12/29/2021.     Immunization History  Administered Date(s) Administered   PPD Test 07/25/2021     PFTs     No data to display           Eosinophils Most recent blood eosinophil count was 900 cells/microL taken on 08/17/21 -> 1000 on 05/04/21   IgE: 53 on 10/24/21   Assessment   Biologics training for mepolizumab (Nucala)  Goals of therapy: Mechanism of Action: Not fully understood. It does act an interleukin-5 (IL-5) antagonist monoclonal antibody that reduces the production and survival of eosinophils by blocking the binding of IL-5 to the alpha chain of the receptor complex on the eosinophil cell surface. Reviewed that Nucala is add-on medication and patient must continue maintenance inhaler regimen. Response to therapy: may take 3 months to 6 months to determine efficacy. Discussed that patients generally feel improvement sooner than 3 months.  Side effects: headache (19%), injection site reaction (7-15%), antibody development (6%), backache (5%), fatigue (5%)  Dose: 100 mg subcutaneously every 4 weeks  Administration/Storage:  Reviewed administration sites of thigh or abdomen (at least 2-3 inches away from abdomen). Reviewed the upper arm is only appropriate if caregiver is administering injection  Do not shake the reconstituted solution as this could lead to product foaming or  precipitation. Solution should be clear to opalescent and colorless to pale yellow or pale brown, essentially particle free. Small air bubbles, however, are expected and acceptable. If particulate matter remains in the solution or if the solution appears cloudy or  milky, discard the solution.  Reviewed storage of medication in refrigerator. Reviewed that Nucala can be stored at room temperature in unopened carton for up to 7 days.  Access: Approval of Nucala through: patient assistance  Patient self-administered Nucala 100mg /mL in left lower abdomen using sample Nucala 100mg /mL autoinjector pen NDC: [phone] Lot: Expiration: 08/2023  Patient monitored for 30 minutes for adverse reaction.  Patient tolerated without issue. Injection site checked and no irritation or itchiness noted by patient. Provider notes no redness of swelling  Medication Reconciliation  A drug regimen assessment was performed, including review of allergies, interactions, disease-state management, dosing and immunization history. Medications were reviewed with the patient, including name, instructions, indication, goals of therapy, potential side effects, importance of adherence, and safe use.  Drug interaction(s): none noted  Immunizations  Reviewed importance of staying UTD on influenza, COVID, and pneumonia vaccines  PLAN Continue Nucala 100mg  every 4 weeks. Next dose is due 01/26/22 and every 4 weeks thereafter. Rx sent to:  [facility] pt assistance program . Patient has scheduled of medication to her home on 01/03/22 Continue maintenance asthma regimen of: Symbicort 160-4.46mcg (2 puffs twice daily), montelukast 10mg  nightly Patient assistance paperwork for Breztri inhaler AZ&Me pt assistance application completed today - to be followed through by CMA  All questions encouraged and answered.  Instructed patient to reach out with any further questions or concerns.  F/u already scheduled w Dr. Gelvandale on 01/25/22  Thank you for allowing pharmacy to participate in this patient's care.  This appointment required 60 minutes of patient care (this includes precharting, chart review, review of results, face-to-face care, etc.).  4m, PharmD, MPH, BCPS,  CPP Clinical Pharmacist (Rheumatology and Pulmonology)

## 2021-12-29 NOTE — Patient Instructions (Signed)
Your next NUCALA dose is due on 01/26/22, 02/23/22, and every 4 weeks thereafter  CONTINUE SYMBICORT AND MONTELUKAST AS PRESCRIBED  Hopefully we hear from patient assistance program for [facility] soon  Your prescription will be shipped from [facility]. Their phone number is [phone]. Please call to schedule shipment and confirm address. They will mail your medication to your home.  You will need to be seen by your provider in 3 to 4 months to assess how NUCALA is working for you. Please ensure you have a follow-up appointment scheduled in DECEMBER 2023 or JANUARY 2024.  Call our clinic if you need to make this appointment.  How to manage an injection site reaction: Remember the 5 C's: COUNTER - leave on the counter at least 30 minutes but up to overnight to bring medication to room temperature. This may help prevent stinging COLD - place something cold (like an ice gel pack or cold water bottle) on the injection site just before cleansing with alcohol. This may help reduce pain CLARITIN - use Claritin (generic name is loratadine) for the first two weeks of treatment or the day of, the day before, and the day after injecting. This will help to minimize injection site reactions CORTISONE CREAM - apply if injection site is irritated and itching CALL ME - if injection site reaction is bigger than the size of your fist, looks infected, blisters, or if you develop hives

## 2021-12-29 NOTE — Telephone Encounter (Signed)
Patient completed AZ&Me application at [facility] new start visit today. Provider form signed by Dr. Thora Lance  Submitted Patient Assistance Application to AZ&ME for  BREZTRI . **patient is uninsured**  Fax# [phone] Phone# [phone]  CMA to follow-up.  Application has been faxed into media tab of her chart for future reference if needed  Chesley Mires, PharmD, MPH, BCPS, CPP Clinical Pharmacist (Rheumatology and Pulmonology)

## 2022-01-08 ENCOUNTER — Ambulatory Visit: Payer: Medicaid Other

## 2022-01-10 ENCOUNTER — Ambulatory Visit
Admission: RE | Admit: 2022-01-10 | Discharge: 2022-01-10 | Disposition: A | Discharge status: Home / Self Care | Payer: Medicaid Other | Source: Ambulatory Visit | Attending: Urgent Care | Admitting: Urgent Care

## 2022-01-10 ENCOUNTER — Other Ambulatory Visit (HOSPITAL_COMMUNITY): Payer: Self-pay

## 2022-01-10 VITALS — BP 123/85 | HR 69 | Temp 98.0°F | Resp 18

## 2022-01-10 DIAGNOSIS — Z202 Contact with and (suspected) exposure to infections with a predominantly sexual mode of transmission: Secondary | ICD-10-CM

## 2022-01-10 NOTE — ED Triage Notes (Signed)
Pt presents with concerns for exposure to std. Pt received blood work back from donating blood stating that she had syphilis. Denies any symptoms.

## 2022-01-10 NOTE — ED Provider Notes (Signed)
[facility]  Note:  This document was prepared using Systems analyst and may include unintentional dictation errors.  MRN: [id number] DOB: 1976-01-07  Subjective:   Wendy Morgan is a 46 y.o. female presenting for HIV and syphilis testing. Patient had blood work done due to trying to donate blood.  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  Patient does not have any more details beyond that.  She is asymptomatic.  Does not want vaginal swab testing.  No current facility-administered medications for this encounter.  Current Outpatient Medications:    albuterol (PROVENTIL) (2.5 MG/3ML) 0.083% nebulizer solution, Take 3 mLs (2.5 mg total) by nebulization every 4 (four) hours as needed for wheezing or shortness of breath., Disp: 90 mL, Rfl: 0   albuterol (VENTOLIN HFA) 108 (90 Base) MCG/ACT inhaler, Inhale 1-2 puffs into the lungs every 6 (six) hours as needed for wheezing or shortness of breath., Disp: 18 g, Rfl: 1   Budeson-Glycopyrrol-Formoterol (BREZTRI AEROSPHERE) 160-9-4.8 MCG/ACT AERO, Inhale 2 puffs into the lungs in the morning and at bedtime., Disp: 5.9 g, Rfl: 0   budesonide-formoterol (SYMBICORT) 160-4.5 MCG/ACT inhaler, Inhale 2 puffs into the lungs 2 (two) times daily. NEEDS PASS, Disp: 10.2 g, Rfl: 3   cetirizine (ZYRTEC ALLERGY) 10 MG tablet, Take 1 tablet (10 mg total) by mouth daily. (Patient taking differently: Take 10 mg by mouth daily as needed.), Disp: 30 tablet, Rfl: 0   fluticasone (FLONASE) 50 MCG/ACT nasal spray, Place 1 spray into both nostrils daily as needed for allergies or rhinitis., Disp: , Rfl:    Mepolizumab (NUCALA) 100 MG/ML SOAJ, Inject 1 mL (100 mg total) into the skin every 28 (twenty-eight) days., Disp: 1 mL, Rfl: 5   montelukast (SINGULAIR) 10 MG tablet, Take 1 tablet by mouth at bedtime. Continue zyrtec in the mornings, Disp: 30 tablet, Rfl: 3   naproxen (NAPROSYN) 375  MG tablet, Take 1 tablet by mouth 2 times daily., Disp: 20 tablet, Rfl: 0   No Known Allergies  Past Medical History:  Diagnosis Date   Asthma    Bronchiectasis with acute exacerbation (HCC)    Class 1 obesity 04/11/2021     Past Surgical History:  Procedure Laterality Date   TUBAL LIGATION      Family History  Problem Relation Age of Onset   Healthy Mother    Healthy Father    Kidney disease Neg Hx    Anxiety disorder Neg Hx    Depression Neg Hx     Social History   Tobacco Use   Smoking status: Former    Packs/day: 0.25    Years: 6.00    Total pack years: 1.50    Types: Cigarettes    Quit date: 1998    Years since quitting: 25.7   Smokeless tobacco: Never  Vaping Use   Vaping Use: Never used  Substance Use Topics   Alcohol use: No   Drug use: No    ROS   Objective:   Vitals: BP 123/85   Pulse 69   Temp 98 F (36.7 C)   Resp 18   SpO2 96%   Physical Exam Constitutional:      General: She is not in acute distress.    Appearance: Normal appearance. She is well-developed. She is not ill-appearing, toxic-appearing or diaphoretic.  HENT:     Head: Normocephalic and atraumatic.     Nose: Nose normal.  Mouth/Throat:     Mouth: Mucous membranes are moist.  Eyes:     General: No scleral icterus.       Right eye: No discharge.        Left eye: No discharge.     Extraocular Movements: Extraocular movements intact.  Cardiovascular:     Rate and Rhythm: Normal rate.  Pulmonary:     Effort: Pulmonary effort is normal.  Skin:    General: Skin is warm and dry.  Neurological:     General: No focal deficit present.     Mental Status: She is alert and oriented to person, place, and time.  Psychiatric:        Mood and Affect: Mood normal.        Behavior: Behavior normal.        Thought Content: Thought content normal.        Judgment: Judgment normal.     Assessment and Plan :   PDMP not reviewed this encounter.  1. Possible exposure to STD      Chart review shows that there is no RPR on file.  She has had negative HIV testing.  Recommended blood testing and treatment based off of results.  She is in agreement.   Jaynee Eagles, [place] 01/10/22 1844

## 2022-01-10 NOTE — Discharge Instructions (Addendum)
Please call the clinic tomorrow once you get your labs results through mychart.

## 2022-01-12 LAB — RPR: RPR Ser Ql: NONREACTIVE

## 2022-01-12 LAB — HIV ANTIBODY (ROUTINE TESTING W REFLEX): HIV Screen 4th Generation wRfx: NONREACTIVE

## 2022-01-21 NOTE — Progress Notes (Deleted)
Synopsis: Referred for severe persistent asthma by Gildardo Pounds, NP  Subjective:   PATIENT ID: Wendy Morgan GENDER: female DOB: [birth year], MRN: [id number]  No chief complaint on file.  45yF with history of severe persistent asthma on symbicort 160 2 puffs twice daily without spacer, singulair, zyrtec, CRS taking flonase previously followed by Dr. Joya Gaskins.   Finished prednisone taper a couple weeks ago. 6+ times has needed prednisone which does relieve her dyspnea. Dyspnea is most prominent symptom when asthma active, does also have some cough with it, productive. She has been on symbicort for the last 3 weeks. Had been on Qvar.   She does have reflux but only when she takes prednisone.   She has no family history of lung disease  She works doing in home care, as a Press photographer. She has no pets. Very limited remote smoking.   Interval HPI:  Started on nucala last visit - had 1st injection 9/14, breztri - applied to PAP  Otherwise pertinent review of systems is negative.  Past Medical History:  Diagnosis Date   Asthma    Bronchiectasis with acute exacerbation ([place])    Class 1 obesity 04/11/2021     Family History  Problem Relation Age of Onset   Healthy Mother    Healthy Father    Kidney disease Neg Hx    Anxiety disorder Neg Hx    Depression Neg Hx      Past Surgical History:  Procedure Laterality Date   TUBAL LIGATION      Social History   Socioeconomic History   Marital status: Single    Spouse name: Not on file   Number of children: Not on file   Years of education: Not on file   Highest education level: Not on file  Occupational History   Not on file  Tobacco Use   Smoking status: Former    Packs/day: 0.25    Years: 6.00    Total pack years: 1.50    Types: Cigarettes    Quit date: 43    Years since quitting: 25.7   Smokeless tobacco: Never  Vaping Use   Vaping Use: Never used  Substance and Sexual Activity   Alcohol use: No   Drug use:  No   Sexual activity: Yes    Birth control/protection: None  Other Topics Concern   Not on file  Social History Narrative   Not on file   Social Determinants of Health   Financial Resource Strain: Not on file  Food Insecurity: Not on file  Transportation Needs: Not on file  Physical Activity: Not on file  Stress: Not on file  Social Connections: Not on file  Intimate Partner Violence: Not on file     No Known Allergies   Outpatient Medications Prior to Visit  Medication Sig Dispense Refill   albuterol (PROVENTIL) (2.5 MG/3ML) 0.083% nebulizer solution Take 3 mLs (2.5 mg total) by nebulization every 4 (four) hours as needed for wheezing or shortness of breath. 90 mL 0   albuterol (VENTOLIN HFA) 108 (90 Base) MCG/ACT inhaler Inhale 1-2 puffs into the lungs every 6 (six) hours as needed for wheezing or shortness of breath. 18 g 1   Budeson-Glycopyrrol-Formoterol (BREZTRI AEROSPHERE) 160-9-4.8 MCG/ACT AERO Inhale 2 puffs into the lungs in the morning and at bedtime. 5.9 g 0   budesonide-formoterol (SYMBICORT) 160-4.5 MCG/ACT inhaler Inhale 2 puffs into the lungs 2 (two) times daily. NEEDS PASS 10.2 g 3   cetirizine (ZYRTEC ALLERGY)  10 MG tablet Take 1 tablet (10 mg total) by mouth daily. (Patient taking differently: Take 10 mg by mouth daily as needed.) 30 tablet 0   fluticasone (FLONASE) 50 MCG/ACT nasal spray Place 1 spray into both nostrils daily as needed for allergies or rhinitis.     Mepolizumab (NUCALA) 100 MG/ML SOAJ Inject 1 mL (100 mg total) into the skin every 28 (twenty-eight) days. 1 mL 5   montelukast (SINGULAIR) 10 MG tablet Take 1 tablet by mouth at bedtime. Continue zyrtec in the mornings 30 tablet 3   naproxen (NAPROSYN) 375 MG tablet Take 1 tablet by mouth 2 times daily. 20 tablet 0   No facility-administered medications prior to visit.       Objective:   Physical Exam:  General appearance: 46 y.o., female, NAD, conversant  Eyes: anicteric sclerae; PERRL,  tracking appropriately HENT: NCAT; MMM Neck: Trachea midline; no lymphadenopathy, no JVD Lungs: faint inspiratory wheeze on right, with normal respiratory effort CV: RRR, no murmur  Abdomen: Soft, non-tender; non-distended, BS present  Extremities: No peripheral edema, warm Skin: Normal turgor and texture; no rash Psych: Appropriate affect Neuro: Alert and oriented to person and place, no focal deficit     There were no vitals filed for this visit.    on RA BMI Readings from Last 3 Encounters:  11/19/21 37.59 kg/m  10/24/21 41.02 kg/m  10/20/21 37.76 kg/m   Wt Readings from Last 3 Encounters:  11/19/21 219 lb (99.3 kg)  10/24/21 239 lb (108.4 kg)  10/20/21 220 lb (99.8 kg)     CBC    Component Value Date/Time   WBC 11.1 (H) 10/20/2021 2114   RBC 4.61 10/20/2021 2114   HGB 13.4 10/20/2021 2114   HGB 14.3 06/09/2019 1051   HCT 40.1 10/20/2021 2114   HCT 41.8 06/09/2019 1051   PLT 364 10/20/2021 2114   PLT 342 06/09/2019 1051   MCV 87.0 10/20/2021 2114   MCV 87 06/09/2019 1051   MCH 29.1 10/20/2021 2114   MCHC 33.4 10/20/2021 2114   RDW 14.6 10/20/2021 2114   RDW 13.3 06/09/2019 1051   LYMPHSABS 1.9 08/17/2021 1325   MONOABS 0.9 08/17/2021 1325   EOSABS 0.9 (H) 08/17/2021 1325   EOSABS 0.3 07/22/2019 0932   BASOSABS 0.0 08/17/2021 1325    Eos [phone]  IgE 53 Aspergillus IgE panel neg  Chest Imaging: CT Chest 07/25/19 reviewed by me with borderline bronchiectasis  CXR 08/17/21 reviewed by me stable  Pulmonary Functions Testing Results:     No data to display            Assessment & Plan:   # Severe persistent asthma, recent exacerbation # Eosinophilic asthma # Borderline central bronchiectasis If not just due to eosinophilic asthma consider ABPA, allergic asthma. Some gerd but only when on steroid, does also have CRS/PND which could be contributory.   Plan: - labs today, PFTs next visit - stop symbicort, try breztri 2 puffs twice daily with  spacer let me know if you prefer it. Once you're out of breztri would resume symbicort unless we continue breztri prescription. - if you don't hear from me in a week with lab results and plan for biologic medicine for asthma or treatment of ABPA (allergic bronchopulmonary aspergillosis) then send a message or call [phone] - stay on singulair, zyrtec, flonase  - albuterol as needed - see you in 3 months or sooner if need be!    Maryjane Hurter, MD [facility]  01/21/2022 12:15 PM

## 2022-01-25 ENCOUNTER — Ambulatory Visit: Payer: Medicaid Other | Admitting: Student

## 2022-02-14 ENCOUNTER — Other Ambulatory Visit: Payer: Self-pay

## 2022-02-21 NOTE — Progress Notes (Deleted)
Synopsis: Referred for severe persistent asthma by Claiborne Rigg, NP  Subjective:   PATIENT ID: Wendy Morgan GENDER: female DOB: Aug 19, 1975, MRN: [id number]  No chief complaint on file.  46yF with history of severe persistent asthma on symbicort 160 2 puffs twice daily without spacer, singulair, zyrtec, CRS taking flonase previously followed by Dr. Delford Field.   Finished prednisone taper a couple weeks ago. 6+ times has needed prednisone which does relieve her dyspnea. Dyspnea is most prominent symptom when asthma active, does also have some cough with it, productive. She has been on symbicort for the last 3 weeks. Had been on Qvar.   She does have reflux but only when she takes prednisone.   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 Very limited remote smoking.   Interval HPI:  Started on nucala last visit - had 1st injection 9/14, breztri - applied to PAP  Otherwise pertinent review of systems is negative.  Past Medical History:  Diagnosis Date   Asthma    Bronchiectasis with acute exacerbation (HCC)    Class 1 obesity 04/11/2021     Family History  Problem Relation Age of Onset   Healthy Mother    Healthy Father    Kidney disease Neg Hx    Anxiety disorder Neg Hx    Depression Neg Hx      Past Surgical History:  Procedure Laterality Date   TUBAL LIGATION      Social History   Socioeconomic History   Marital status: Single    Spouse name: Not on file   Number of children: Not on file   Years of education: Not on file   Highest education level: Not on file  Occupational History   Not on file  Tobacco Use   Smoking status: Former    Packs/day: 0.25    Years: 6.00    Total pack years: 1.50    Types: Cigarettes    Quit date: 27    Years since quitting: 25.8   Smokeless tobacco: Never  Vaping Use   Vaping Use: Never used  Substance and Sexual Activity   Alcohol use: No   Drug use:  No   Sexual activity: Yes    Birth control/protection: None  Other Topics Concern   Not on file  Social History Narrative   Not on file   Social Determinants of Health   Financial Resource Strain: Not on file  Food Insecurity: Not on file  Transportation Needs: Not on file  Physical Activity: Not on file  Stress: Not on file  Social Connections: Not on file  Intimate Partner Violence: Not on file     No Known Allergies   Outpatient Medications Prior to Visit  Medication Sig Dispense Refill   albuterol (PROVENTIL) (2.5 MG/3ML) 0.083% nebulizer solution Take 3 mLs (2.5 mg total) by nebulization every 4 (four) hours as needed for wheezing or shortness of breath. 90 mL 0   albuterol (VENTOLIN HFA) 108 (90 Base) MCG/ACT inhaler Inhale 1-2 puffs into the lungs every 6 (six) hours as needed for wheezing or shortness of breath. 18 g 1   Budeson-Glycopyrrol-Formoterol (BREZTRI AEROSPHERE) 160-9-4.8 MCG/ACT AERO Inhale 2 puffs into the lungs in the morning and at bedtime. 5.9 g 0   budesonide-formoterol (SYMBICORT) 160-4.5 MCG/ACT inhaler Inhale 2 puffs into the lungs 2 (two) times daily. NEEDS PASS 10.2 g 3   cetirizine (ZYRTEC ALLERGY)  10 MG tablet Take 1 tablet (10 mg total) by mouth daily. (Patient taking differently: Take 10 mg by mouth daily as needed.) 30 tablet 0   fluticasone (FLONASE) 50 MCG/ACT nasal spray Place 1 spray into both nostrils daily as needed for allergies or rhinitis.     Mepolizumab (NUCALA) 100 MG/ML SOAJ Inject 1 mL (100 mg total) into the skin every 28 (twenty-eight) days. 1 mL 5   montelukast (SINGULAIR) 10 MG tablet Take 1 tablet by mouth at bedtime. Continue zyrtec in the mornings 30 tablet 3   naproxen (NAPROSYN) 375 MG tablet Take 1 tablet by mouth 2 times daily. 20 tablet 0   No facility-administered medications prior to visit.       Objective:   Physical Exam:  General appearance: 46 y.o., female, female, NAD, conversant  Eyes: anicteric sclerae; PERRL,  tracking appropriately HENT: NCAT; MMM Neck: Trachea midline; no lymphadenopathy, no JVD Lungs: faint inspiratory wheeze on right, with normal respiratory effort CV: RRR, no murmur  Abdomen: Soft, non-tender; non-distended, BS present  Extremities: No peripheral edema, warm Skin: Normal turgor and texture; no rash Psych: Appropriate affect Neuro: Alert and oriented to person and place, no focal deficit     There were no vitals filed for this visit.    on RA BMI Readings from Last 3 Encounters:  11/19/21 37.59 kg/m  10/24/21 41.02 kg/m  10/20/21 37.76 kg/m   Wt Readings from Last 3 Encounters:  11/19/21 219 lb (99.3 kg)  10/24/21 239 lb (108.4 kg)  10/20/21 220 lb (99.8 kg)     CBC    Component Value Date/Time   WBC 11.1 (H) 10/20/2021 2114   RBC 4.61 10/20/2021 2114   HGB 13.4 10/20/2021 2114   HGB 14.3 06/09/2019 1051   HCT 40.1 10/20/2021 2114   HCT 41.8 06/09/2019 1051   PLT 364 10/20/2021 2114   PLT 342 06/09/2019 1051   MCV 87.0 10/20/2021 2114   MCV 87 06/09/2019 1051   MCH 29.1 10/20/2021 2114   MCHC 33.4 10/20/2021 2114   RDW 14.6 10/20/2021 2114   RDW 13.3 06/09/2019 1051   LYMPHSABS 1.9 08/17/2021 1325   MONOABS 0.9 08/17/2021 1325   EOSABS 0.9 (H) 08/17/2021 1325   EOSABS 0.3 07/22/2019 0932   BASOSABS 0.0 08/17/2021 1325    Eos [phone]  IgE 53 Aspergillus IgE panel neg  Chest Imaging: CT Chest 07/25/19 reviewed by me with borderline bronchiectasis  CXR 08/17/21 reviewed by me stable  Pulmonary Functions Testing Results:     No data to display            Assessment & Plan:   # Severe persistent asthma, recent exacerbation # Eosinophilic asthma # Borderline central bronchiectasis If not just due to eosinophilic asthma consider ABPA, allergic asthma. Some gerd but only when on steroid, does also have CRS/PND which could be contributory.   Plan: - labs today, PFTs next visit - stop symbicort, try breztri 2 puffs twice daily with  spacer let me know if you prefer it. Once you're out of breztri would resume symbicort unless we continue breztri prescription. - if you don't hear from me in a week with lab results and plan for biologic medicine for asthma or treatment of ABPA (allergic bronchopulmonary aspergillosis) then send a message or call [phone] - stay on singulair, zyrtec, flonase  - albuterol as needed - see you in 3 months or sooner if need be!    Maryjane Hurter, MD [facility]  02/21/2022 5:51 PM

## 2022-02-22 ENCOUNTER — Ambulatory Visit: Payer: Medicaid Other | Admitting: Student

## 2022-03-06 ENCOUNTER — Other Ambulatory Visit (HOSPITAL_COMMUNITY): Payer: Self-pay

## 2022-03-17 DIAGNOSIS — Z419 Encounter for procedure for purposes other than remedying health state, unspecified: Secondary | ICD-10-CM | POA: Diagnosis not present

## 2022-03-28 NOTE — Progress Notes (Deleted)
Synopsis: Referred for severe persistent asthma by Claiborne Rigg, NP  Subjective:   PATIENT ID: Wendy Morgan GENDER: female DOB: 12-05-1975, MRN: [id number]  No chief complaint on file.  45yF with history of severe persistent asthma on symbicort 160 2 puffs twice daily without spacer, singulair, zyrtec, CRS taking flonase previously followed by Dr. Delford Field.   Finished prednisone taper a couple weeks ago. 6+ times has needed prednisone which does relieve her dyspnea. Dyspnea is most prominent symptom when asthma active, does also have some cough with it, productive. She has been on symbicort for the last 3 weeks. Had been on Qvar.   She does have reflux but only when she takes prednisone.   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 Very limited remote smoking.   Interval HPI  Started on nucala last visit  Otherwise pertinent review of systems is negative.  Past Medical History:  Diagnosis Date   Asthma    Bronchiectasis with acute exacerbation (HCC)    Class 1 obesity 04/11/2021     Family History  Problem Relation Age of Onset   Healthy Mother    Healthy Father    Kidney disease Neg Hx    Anxiety disorder Neg Hx    Depression Neg Hx      Past Surgical History:  Procedure Laterality Date   TUBAL LIGATION      Social History   Socioeconomic History   Marital status: Single    Spouse name: Not on file   Number of children: Not on file   Years of education: Not on file   Highest education level: Not on file  Occupational History   Not on file  Tobacco Use   Smoking status: Former    Packs/day: 0.25    Years: 6.00    Total pack years: 1.50    Types: Cigarettes    Quit date: 47    Years since quitting: 25.9   Smokeless tobacco: Never  Vaping Use   Vaping Use: Never used  Substance and Sexual Activity   Alcohol use: No   Drug use: No   Sexual activity: Yes    Birth  control/protection: None  Other Topics Concern   Not on file  Social History Narrative   Not on file   Social Determinants of Health   Financial Resource Strain: Not on file  Food Insecurity: Not on file  Transportation Needs: Not on file  Physical Activity: Not on file  Stress: Not on file  Social Connections: Not on file  Intimate Partner Violence: Not on file     No Known Allergies   Outpatient Medications Prior to Visit  Medication Sig Dispense Refill   albuterol (PROVENTIL) (2.5 MG/3ML) 0.083% nebulizer solution Take 3 mLs (2.5 mg total) by nebulization every 4 (four) hours as needed for wheezing or shortness of breath. 90 mL 0   albuterol (VENTOLIN HFA) 108 (90 Base) MCG/ACT inhaler Inhale 1-2 puffs into the lungs every 6 (six) hours as needed for wheezing or shortness of breath. 18 g 1   Budeson-Glycopyrrol-Formoterol (BREZTRI AEROSPHERE) 160-9-4.8 MCG/ACT AERO Inhale 2 puffs into the lungs in the morning and at bedtime. 5.9 g 0   budesonide-formoterol (SYMBICORT) 160-4.5 MCG/ACT inhaler Inhale 2 puffs into the lungs 2 (two) times daily. NEEDS PASS 10.2 g 3   cetirizine (ZYRTEC ALLERGY) 10 MG tablet Take 1 tablet (10 mg total) by  mouth daily. (Patient taking differently: Take 10 mg by mouth daily as needed.) 30 tablet 0   fluticasone (FLONASE) 50 MCG/ACT nasal spray Place 1 spray into both nostrils daily as needed for allergies or rhinitis.     Mepolizumab (NUCALA) 100 MG/ML SOAJ Inject 1 mL (100 mg total) into the skin every 28 (twenty-eight) days. 1 mL 5   montelukast (SINGULAIR) 10 MG tablet Take 1 tablet by mouth at bedtime. Continue zyrtec in the mornings 30 tablet 3   naproxen (NAPROSYN) 375 MG tablet Take 1 tablet by mouth 2 times daily. 20 tablet 0   No facility-administered medications prior to visit.       Objective:   Physical Exam:  General appearance: 46 y.o., female, NAD, conversant  Eyes: anicteric sclerae; PERRL, tracking appropriately HENT: NCAT;  MMM Neck: Trachea midline; no lymphadenopathy, no JVD Lungs: faint inspiratory wheeze on right, with normal respiratory effort CV: RRR, no murmur  Abdomen: Soft, non-tender; non-distended, BS present  Extremities: No peripheral edema, warm Skin: Normal turgor and texture; no rash Psych: Appropriate affect Neuro: Alert and oriented to person and place, no focal deficit     There were no vitals filed for this visit.    on RA BMI Readings from Last 3 Encounters:  11/19/21 37.59 kg/m  10/24/21 41.02 kg/m  10/20/21 37.76 kg/m   Wt Readings from Last 3 Encounters:  11/19/21 219 lb (99.3 kg)  10/24/21 239 lb (108.4 kg)  10/20/21 220 lb (99.8 kg)     CBC    Component Value Date/Time   WBC 11.1 (H) 10/20/2021 2114   RBC 4.61 10/20/2021 2114   HGB 13.4 10/20/2021 2114   HGB 14.3 06/09/2019 1051   HCT 40.1 10/20/2021 2114   HCT 41.8 06/09/2019 1051   PLT 364 10/20/2021 2114   PLT 342 06/09/2019 1051   MCV 87.0 10/20/2021 2114   MCV 87 06/09/2019 1051   MCH 29.1 10/20/2021 2114   MCHC 33.4 10/20/2021 2114   RDW 14.6 10/20/2021 2114   RDW 13.3 06/09/2019 1051   LYMPHSABS 1.9 08/17/2021 1325   MONOABS 0.9 08/17/2021 1325   EOSABS 0.9 (H) 08/17/2021 1325   EOSABS 0.3 07/22/2019 0932   BASOSABS 0.0 08/17/2021 1325    Eos [phone]  IgE 67 in 2021  Chest Imaging: CT Chest 07/25/19 reviewed by me with borderline bronchiectasis  CXR 08/17/21 reviewed by me stable  Pulmonary Functions Testing Results:     No data to display            Assessment & Plan:   # Severe persistent asthma, recent exacerbation # Eosinophilic asthma # Borderline central bronchiectasis If not just due to eosinophilic asthma consider ABPA, allergic asthma. Some gerd but only when on steroid, does also have CRS/PND which could be contributory.   Plan: - labs today, PFTs next visit - stop symbicort, try breztri 2 puffs twice daily with spacer let me know if you prefer it. Once you're out of  breztri would resume symbicort unless we continue breztri prescription. - if you don't hear from me in a week with lab results and plan for biologic medicine for asthma or treatment of ABPA (allergic bronchopulmonary aspergillosis) then send a message or call [phone] - stay on singulair, zyrtec, flonase  - albuterol as needed - see you in 3 months or sooner if need be!    Omar Person, MD [facility] 03/28/2022 6:11 PM

## 2022-03-29 ENCOUNTER — Emergency Department (HOSPITAL_COMMUNITY)
Admission: EM | Admit: 2022-03-29 | Discharge: 2022-03-29 | Disposition: A | Discharge status: Home / Self Care | Payer: Medicaid Other | Attending: Emergency Medicine | Admitting: Emergency Medicine

## 2022-03-29 DIAGNOSIS — K0889 Other specified disorders of teeth and supporting structures: Secondary | ICD-10-CM | POA: Insufficient documentation

## 2022-03-29 MED ORDER — AMOXICILLIN-POT CLAVULANATE 875-125 MG PO TABS
1.0000 | ORAL_TABLET | Freq: Two times a day (BID) | ORAL | 0 refills | Status: DC
  Filled 2022-03-29: qty 14, 7d supply, fill #0

## 2022-03-29 MED ORDER — NAPROXEN 375 MG PO TABS
375.0000 mg | ORAL_TABLET | Freq: Two times a day (BID) | ORAL | 0 refills | Status: DC
  Filled 2022-03-29: qty 20, 10d supply, fill #0

## 2022-03-29 NOTE — ED Provider Triage Note (Signed)
Emergency Medicine Provider Triage Evaluation Note  Wendy Morgan , a 46 y.o. female  was evaluated in triage.  Pt complains of left upper dental pain for 2 days.  She states she noticed left side facial swelling yesterday and cannot get a dental appointment until January.  Denies fever, chills, difficulty swallowing, voice changes, eye pain.   Review of Systems  Positive: See above Negative: See above  Physical Exam  BP (!) 147/94 (BP Location: Right Arm)   Pulse 62   Temp 97.6 F (36.4 C) (Oral)   Resp 18   Ht 5\' 4"  (1.626 m)   Wt 104.3 kg   SpO2 98%   BMI 39.48 kg/m  Gen:   Awake, no distress   Resp:  Normal effort  MSK:   Moves extremities without difficulty  Other:  Broken teeth in upper left, buccal surface of gums actively draining pus and blood  Medical Decision Making  Medically screening exam initiated at 2:19 PM.  Appropriate orders placed.  DAIJHA LEGGIO was informed that the remainder of the evaluation will be completed by another provider, this initial triage assessment does not replace that evaluation, and the importance of remaining in the ED until their evaluation is complete.     Brett Fairy R, [place] [facility]ar 03/29/22 1425

## 2022-03-29 NOTE — ED Triage Notes (Signed)
Pt to ED c/o left upper dental pain x 2 days, noticed left side facial swelling yesterday. Reports cannot get in to see a dentist till January.

## 2022-03-29 NOTE — Discharge Instructions (Signed)
Your exam today is overall reassuring.  No evidence of abscess.  Continue drinking plenty of fluids.  I have sent antibiotic and naproxen into the pharmacy for you.  For any concerning symptoms return to the emergency room otherwise please call your dentist to schedule a sooner appointment.

## 2022-03-29 NOTE — ED Provider Notes (Signed)
[facility] EMERGENCY DEPARTMENT Provider Note   CSN: [id number] Arrival date & time: 03/29/22  1250     History  Chief Complaint  Patient presents with   Dental Pain    Wendy Morgan is a 46 y.o. female.  46 year old female presents today for evaluation of left sided dental pain and facial swelling that started 2 days ago.  States she is unable to get in with her dentist until January 11.  Denies fever, inability to tolerate p.o. intake.  States she has been able to drink fluids without much difficulty.  States she has had some drainage as well.  Swelling has gone down.  The history is provided by the patient. No language interpreter was used.       Home Medications Prior to Admission medications   Medication Sig Start Date End Date Taking? Authorizing Provider  albuterol (PROVENTIL) (2.5 MG/3ML) 0.083% nebulizer solution Take 3 mLs (2.5 mg total) by nebulization every 4 (four) hours as needed for wheezing or shortness of breath. 09/19/21  Yes Dione Booze, MD  albuterol (VENTOLIN HFA) 108 (90 Base) MCG/ACT inhaler Inhale 1-2 puffs into the lungs every 6 (six) hours as needed for wheezing or shortness of breath. 08/16/21  Yes Claiborne Rigg, NP  Budeson-Glycopyrrol-Formoterol (BREZTRI AEROSPHERE) 160-9-4.8 MCG/ACT AERO Inhale 2 puffs into the lungs in the morning and at bedtime. 10/24/21  Yes Omar Person, MD  Mepolizumab (NUCALA) 100 MG/ML SOAJ Inject 1 mL (100 mg total) into the skin every 28 (twenty-eight) days. 12/29/21  Yes Omar Person, MD  budesonide-formoterol [facility]) 160-4.5 MCG/ACT inhaler Inhale 2 puffs into the lungs 2 (two) times daily. NEEDS PASS Patient not taking: Reported on 03/29/2022 08/16/21   Claiborne Rigg, NP  cetirizine (ZYRTEC ALLERGY) 10 MG tablet Take 1 tablet (10 mg total) by mouth daily. Patient not taking: Reported on 03/29/2022 08/02/21 09/02/21  Redwine, Madison A, PA-C  montelukast (SINGULAIR) 10 MG tablet Take 1  tablet by mouth at bedtime. Continue zyrtec in the mornings Patient not taking: Reported on 03/29/2022 08/16/21   Claiborne Rigg, NP  naproxen (NAPROSYN) 375 MG tablet Take 1 tablet by mouth 2 times daily. Patient not taking: Reported on 03/29/2022 10/21/21   Marita Kansas, PA-C  esomeprazole (NEXIUM) 40 MG capsule TAKE 1 CAPSULE BY MOUTH DAILY 08/17/21 08/18/21  Charlynne Pander, MD  sucralfate (CARAFATE) 1 g tablet TAKE 1 TABLET BY MOUTH 4 TIMES DAILY-WITH MEALS AND AT BEDTIME 08/17/21 08/18/21  Charlynne Pander, MD      Allergies    Patient has no known allergies.    Review of Systems   Review of Systems  Constitutional:  Negative for chills and fever.  HENT:  Positive for dental problem. Negative for drooling, sore throat, trouble swallowing and voice change.   All other systems reviewed and are negative.   Physical Exam Updated Vital Signs BP (!) 145/85   Pulse 64   Temp 97.8 F (36.6 C) (Oral)   Resp 15   Ht 5\' 4"  (1.626 m)   Wt 104.3 kg   SpO2 99%   BMI 39.48 kg/m  Physical Exam Vitals and nursing note reviewed.  Constitutional:      General: She is not in acute distress.    Appearance: Normal appearance. She is not ill-appearing.  HENT:     Head: Normocephalic and atraumatic.     Comments: No visible external facial swelling.  No visible dental abscess noted.  Without retropharyngeal abscess,  Ludwig's angina, peritonsillar abscess.  Poor dentition noted throughout.    Nose: Nose normal.  Eyes:     Conjunctiva/sclera: Conjunctivae normal.  Pulmonary:     Effort: Pulmonary effort is normal. No respiratory distress.  Musculoskeletal:        General: No deformity.  Skin:    Findings: No rash.  Neurological:     Mental Status: She is alert.     ED Results / Procedures / Treatments   Labs (all labs ordered are listed, but only abnormal results are displayed) Labs Reviewed - No data to display  EKG None  Radiology No results found.  Procedures Procedures     Medications Ordered in ED Medications - No data to display  ED Course/ Medical Decision Making/ A&P                           Medical Decision Making  45 year old female presents today for evaluation of dental pain and swelling.  Swelling has resolved.  She states she has had some drainage.  No active drainage noted.  No evidence of dental abscess, retropharyngeal abscess, Ludwig's angina, PTA.  Without drooling, or trismus.  Will start patient on Augmentin.  Will prescribe naproxen.  Discussed importance of following up with dentist sooner than January 11.  She states she will call to schedule this.  Patient is otherwise stable for discharge.  Discharged in stable condition.  Return precaution discussed.  Patient voices understanding and is in agreement with plan.   Final Clinical Impression(s) / ED Diagnoses Final diagnoses:  Pain, dental    Rx / DC Orders ED Discharge Orders          Ordered    amoxicillin-clavulanate (AUGMENTIN) 875-125 MG tablet  Every 12 hours        03/29/22 1819    naproxen (NAPROSYN) 375 MG tablet  2 times daily        03/29/22 1819              Marita Kansas, PA-C 03/29/22 1820    Gwyneth Sprout, MD 03/29/22 2206

## 2022-03-30 ENCOUNTER — Ambulatory Visit: Payer: Medicaid Other | Admitting: Student

## 2022-03-30 ENCOUNTER — Other Ambulatory Visit: Payer: Self-pay

## 2022-03-30 NOTE — Progress Notes (Signed)
Synopsis: Referred for severe persistent asthma by Claiborne Rigg, NP  Subjective:   PATIENT ID: Wendy Morgan GENDER: female DOB: [birth year], MRN: [id number]  Chief Complaint  Patient presents with   Follow-up    Discuss Nucala progress.  Pt states she has had no asthma sx since starting Nucala.  July 2023   45yF with history of severe persistent asthma on symbicort 160 2 puffs twice daily without spacer, singulair, zyrtec, CRS taking flonase previously followed by Dr. Delford Field.   Finished prednisone taper a couple weeks ago. 6+ times has needed prednisone which does relieve her dyspnea. Dyspnea is most prominent symptom when asthma active, does also have some cough with it, productive. She has been on symbicort for the last 3 weeks. Had been on Qvar.   She does have reflux but only when she takes prednisone.   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 Very limited remote smoking.   Interval HPI  Started on nucala last visit  Breztri 2 puffs bid rinsing mouth. Hasn't needed albuterol at all. No sinus congestoin, postnasal drainage.   Off of singulair, flonase, zyrtec  Otherwise pertinent review of systems is negative.  Past Medical History:  Diagnosis Date   Asthma    Bronchiectasis with acute exacerbation (HCC)    Class 1 obesity 04/11/2021     Family History  Problem Relation Age of Onset   Healthy Mother    Healthy Father    Kidney disease Neg Hx    Anxiety disorder Neg Hx    Depression Neg Hx      Past Surgical History:  Procedure Laterality Date   TUBAL LIGATION      Social History   Socioeconomic History   Marital status: Single    Spouse name: Not on file   Number of children: Not on file   Years of education: Not on file   Highest education level: Not on file  Occupational History   Not on file  Tobacco Use   Smoking status: Former    Packs/day: 0.25    Years: 6.00    Total  pack years: 1.50    Types: Cigarettes    Quit date: 74    Years since quitting: 25.9    Passive exposure: Never   Smokeless tobacco: Never  Vaping Use   Vaping Use: Never used  Substance and Sexual Activity   Alcohol use: No   Drug use: No   Sexual activity: Yes    Birth control/protection: None  Other Topics Concern   Not on file  Social History Narrative   Not on file   Social Determinants of Health   Financial Resource Strain: Not on file  Food Insecurity: Not on file  Transportation Needs: Not on file  Physical Activity: Not on file  Stress: Not on file  Social Connections: Not on file  Intimate Partner Violence: Not on file     No Known Allergies   Outpatient Medications Prior to Visit  Medication Sig Dispense Refill   albuterol (PROVENTIL) (2.5 MG/3ML) 0.083% nebulizer solution Take 3 mLs (2.5 mg total) by nebulization every 4 (four) hours as needed for wheezing or shortness of breath. 90 mL 0   albuterol (VENTOLIN HFA) 108 (90 Base) MCG/ACT inhaler Inhale 1-2 puffs into the lungs every 6 (six) hours as needed for wheezing or shortness of breath. 18 g 1   amoxicillin-clavulanate (AUGMENTIN)  875-125 MG tablet Take 1 tablet by mouth every 12 (twelve) hours. 14 tablet 0   Budeson-Glycopyrrol-Formoterol (BREZTRI AEROSPHERE) 160-9-4.8 MCG/ACT AERO Inhale 2 puffs into the lungs in the morning and at bedtime. 5.9 g 0   Mepolizumab (NUCALA) 100 MG/ML SOAJ Inject 1 mL (100 mg total) into the skin every 28 (twenty-eight) days. 1 mL 5   montelukast (SINGULAIR) 10 MG tablet Take 1 tablet by mouth at bedtime. Continue zyrtec in the mornings 30 tablet 3   budesonide-formoterol (SYMBICORT) 160-4.5 MCG/ACT inhaler Inhale 2 puffs into the lungs 2 (two) times daily. NEEDS PASS (Patient not taking: Reported on 03/29/2022) 10.2 g 3   cetirizine (ZYRTEC ALLERGY) 10 MG tablet Take 1 tablet (10 mg total) by mouth daily. (Patient not taking: Reported on 03/29/2022) 30 tablet 0   naproxen  (NAPROSYN) 375 MG tablet Take 1 tablet (375 mg total) by mouth 2 (two) times daily. (Patient not taking: Reported on 03/31/2022) 20 tablet 0   No facility-administered medications prior to visit.       Objective:   Physical Exam:  General appearance: 46 y.o., female, NAD, conversant  Eyes: anicteric sclerae; PERRL, tracking appropriately HENT: NCAT; MMM Neck: Trachea midline; no lymphadenopathy, no JVD Lungs: ctab, with normal respiratory effort CV: RRR, no murmur  Abdomen: Soft, non-tender; non-distended, BS present  Extremities: No peripheral edema, warm Skin: Normal turgor and texture; no rash Psych: Appropriate affect Neuro: Alert and oriented to person and place, no focal deficit     Vitals:   03/31/22 1432  BP: 132/76  Pulse: 73  Temp: 98.2 F (36.8 C)  TempSrc: Oral  SpO2: 100%  Weight: 238 lb 12.8 oz (108.3 kg)  Height: 5\' 4"  (1.626 m)    100% on RA BMI Readings from Last 3 Encounters:  03/31/22 40.99 kg/m  03/29/22 39.48 kg/m  11/19/21 37.59 kg/m   Wt Readings from Last 3 Encounters:  03/31/22 238 lb 12.8 oz (108.3 kg)  03/29/22 230 lb (104.3 kg)  11/19/21 219 lb (99.3 kg)     CBC    Component Value Date/Time   WBC 11.1 (H) 10/20/2021 2114   RBC 4.61 10/20/2021 2114   HGB 13.4 10/20/2021 2114   HGB 14.3 06/09/2019 1051   HCT 40.1 10/20/2021 2114   HCT 41.8 06/09/2019 1051   PLT 364 10/20/2021 2114   PLT 342 06/09/2019 1051   MCV 87.0 10/20/2021 2114   MCV 87 06/09/2019 1051   MCH 29.1 10/20/2021 2114   MCHC 33.4 10/20/2021 2114   RDW 14.6 10/20/2021 2114   RDW 13.3 06/09/2019 1051   LYMPHSABS 1.9 08/17/2021 1325   MONOABS 0.9 08/17/2021 1325   EOSABS 0.9 (H) 08/17/2021 1325   EOSABS 0.3 07/22/2019 0932   BASOSABS 0.0 08/17/2021 1325    Eos [id number]  IgE 67 in 2021  Chest Imaging: CT Chest 07/25/19 reviewed by me with borderline bronchiectasis  CXR 08/17/21 reviewed by me stable  Pulmonary Functions Testing Results:     No  data to display            Assessment & Plan:   # Severe 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 This has all cleared up remarkably with addition of nucala.  Plan: - breztri prn - albuterol as needed - see you in 3 months or sooner if need be!    10/17/21, MD [facility]  03/31/2022 2:43 PM

## 2022-03-31 ENCOUNTER — Ambulatory Visit (INDEPENDENT_AMBULATORY_CARE_PROVIDER_SITE_OTHER): Payer: Medicaid Other | Admitting: Student

## 2022-03-31 ENCOUNTER — Encounter: Payer: Self-pay | Admitting: Student

## 2022-03-31 VITALS — BP 132/76 | HR 73 | Temp 98.2°F | Ht 64.0 in | Wt 238.8 lb

## 2022-03-31 DIAGNOSIS — J4550 Severe persistent asthma, uncomplicated: Secondary | ICD-10-CM | POA: Diagnosis not present

## 2022-03-31 NOTE — Patient Instructions (Signed)
-   can decrease to breztri 2 puffs once daily as long as asthma symptoms remain under control - ok to remain off of zyrtec, flonase - see you in 3 months or sooner if need be!

## 2022-04-11 ENCOUNTER — Ambulatory Visit (INDEPENDENT_AMBULATORY_CARE_PROVIDER_SITE_OTHER): Payer: Medicaid Other | Admitting: Nurse Practitioner

## 2022-04-11 ENCOUNTER — Encounter: Payer: Self-pay | Admitting: Nurse Practitioner

## 2022-04-11 VITALS — BP 124/78 | HR 68 | Temp 97.6°F | Ht 64.0 in | Wt 237.4 lb

## 2022-04-11 DIAGNOSIS — Z23 Encounter for immunization: Secondary | ICD-10-CM

## 2022-04-11 DIAGNOSIS — Z1159 Encounter for screening for other viral diseases: Secondary | ICD-10-CM | POA: Diagnosis not present

## 2022-04-11 DIAGNOSIS — H0263 Xanthelasma of right eye, unspecified eyelid: Secondary | ICD-10-CM | POA: Insufficient documentation

## 2022-04-11 DIAGNOSIS — R1032 Left lower quadrant pain: Secondary | ICD-10-CM | POA: Insufficient documentation

## 2022-04-11 DIAGNOSIS — H0266 Xanthelasma of left eye, unspecified eyelid: Secondary | ICD-10-CM

## 2022-04-11 DIAGNOSIS — Z1211 Encounter for screening for malignant neoplasm of colon: Secondary | ICD-10-CM

## 2022-04-11 DIAGNOSIS — J4540 Moderate persistent asthma, uncomplicated: Secondary | ICD-10-CM | POA: Diagnosis not present

## 2022-04-11 LAB — LIPID PANEL
Cholesterol: 155 mg/dL (ref 0–200)
HDL: 50.7 mg/dL (ref >39.00)
LDL Cholesterol: 93 mg/dL (ref 0–99)
NonHDL: 104.69
Total CHOL/HDL Ratio: 3
Triglycerides: 58 mg/dL (ref 0.0–149.0)
VLDL: 11.6 mg/dL (ref 0.0–40.0)

## 2022-04-11 NOTE — Assessment & Plan Note (Signed)
Chronic, stable.  She is currently following with pulmonology.  Continue Breztri inhaler daily along with albuterol inhaler as needed.  She is also doing Nucala injections monthly.  Follow-up as symptoms worsen or with any concerns.  Prevnar 20 injection given today.

## 2022-04-11 NOTE — Patient Instructions (Signed)
It was great to see you!  Start the attached exercises daily. You can continue ibuprofen as needed and also use heat or ice.   We are checking your labs today and will let you know the results via mychart/phone.   Let's follow-up in 3 months, sooner if you have concerns.  If a referral was placed today, you will be contacted for an appointment. Please note that routine referrals can sometimes take up to 3-4 weeks to process. Please call our office if you haven't heard anything after this time frame.  Take care,  Rodman Pickle, NP

## 2022-04-11 NOTE — Assessment & Plan Note (Signed)
Will check lipid panel today.  She does plan to schedule an appointment with an eye doctor.

## 2022-04-11 NOTE — Assessment & Plan Note (Signed)
BMI 40.7.  Discussed nutrition, exercise.  She states that she gained weight when she was on prednisone for a while with her asthma.

## 2022-04-11 NOTE — Progress Notes (Signed)
New Patient Visit  BP 124/78 (BP Location: Right Arm, Patient Position: Sitting, Cuff Size: Normal)   Morgan 68   Temp 97.6 F (36.4 C) (Temporal)   Ht 5\' 4"  (1.626 m)   Wt 237 lb 6.4 oz (107.7 kg)   SpO2 96%   BMI 40.75 kg/m    Subjective:    Patient ID: Wendy Morgan, female    DOB: Jun 23, 1975, 46 y.o.   MRN: [id number]  CC: Chief Complaint  Patient presents with   Establish Care    New pt, est care Upper left leg pain ( more so in pelvic area) x 1 year  Eye pockets on eyelids x 2 year  Requesting records for Pap    HPI: Wendy Morgan is a 46 y.o. female presents for new patient visit to establish care.  Introduced to Designer, jewellery role and practice setting.  All questions answered.  Discussed provider/patient relationship and expectations.  She has a history of asthma. She goes to a lung specialist and is currently doing nucala injections, breztri inhaler, and albuterol as needed. She states this is well controlled.  She has been having pain in her left groin for about a year. She states that it started after pulling a muscle in her groin. It hurts worse with walking, and laying on her side. Sitting down and resting makes the pain better. It tends to go away with ibuprofen.   She has also noticed "pockets" on her eyelids for about 2 years. She denies pain. The right one tiches at times. She was told her cholesterol may be elevated.   Depression and Anxiety Screen done:     04/11/2022   10:51 AM 07/22/2019    8:46 AM 06/26/2018    3:09 PM  Depression screen PHQ 2/9  Decreased Interest 0 0 0  Down, Depressed, Hopeless 0 0 0  PHQ - 2 Score 0 0 0  Altered sleeping 0 1 0  Tired, decreased energy 0 1 0  Change in appetite 3 1 0  Feeling bad or failure about yourself  0 0 0  Trouble concentrating 0 0 0  Moving slowly or fidgety/restless 0 0 0  Suicidal thoughts 0 0 0  PHQ-9 Score 3 3 0  Difficult doing work/chores Not difficult at all Somewhat difficult        04/11/2022   10:51 AM 07/22/2019    8:46 AM 06/26/2018    3:09 PM  GAD 7 : Generalized Anxiety Score  Nervous, Anxious, on Edge 0 0 0  Control/stop worrying 0 0 0  Worry too much - different things 0 0 0  Trouble relaxing 0 0 0  Restless 0 0 0  Easily annoyed or irritable 0 0 0  Afraid - awful might happen 0 0 0  Total GAD 7 Score 0 0 0  Anxiety Difficulty Not difficult at all Not difficult at all     Past Medical History:  Diagnosis Date   Asthma    Bronchiectasis with acute exacerbation ([place])    Class 1 obesity 04/11/2021    Past Surgical History:  Procedure Laterality Date   TUBAL LIGATION      Family History  Problem Relation Age of Onset   Healthy Mother    Healthy Father    Kidney disease Neg Hx    Anxiety disorder Neg Hx    Depression Neg Hx      Social History   Tobacco Use   Smoking status: Former  Packs/day: 0.25    Years: 6.00    Total pack years: 1.50    Types: Cigarettes    Quit date: 48    Years since quitting: 26.0    Passive exposure: Never   Smokeless tobacco: Never  Vaping Use   Vaping Use: Never used  Substance Use Topics   Alcohol use: No   Drug use: No    Current Outpatient Medications on File Prior to Visit  Medication Sig Dispense Refill   albuterol (VENTOLIN HFA) 108 (90 Base) MCG/ACT inhaler Inhale 1-2 puffs into the lungs every 6 (six) hours as needed for wheezing or shortness of breath. 18 g 1   Budeson-Glycopyrrol-Formoterol (BREZTRI AEROSPHERE) 160-9-4.8 MCG/ACT AERO Inhale 2 puffs into the lungs in the morning and at bedtime. 5.9 g 0   Mepolizumab (NUCALA) 100 MG/ML SOAJ Inject 1 mL (100 mg total) into the skin every 28 (twenty-eight) days. 1 mL 5   albuterol (PROVENTIL) (2.5 MG/3ML) 0.083% nebulizer solution Take 3 mLs (2.5 mg total) by nebulization every 4 (four) hours as needed for wheezing or shortness of breath. (Patient not taking: Reported on 04/11/2022) 90 mL 0   [DISCONTINUED] esomeprazole (NEXIUM) 40 MG  capsule TAKE 1 CAPSULE BY MOUTH DAILY 30 capsule 0   [DISCONTINUED] sucralfate (CARAFATE) 1 g tablet TAKE 1 TABLET BY MOUTH 4 TIMES DAILY-WITH MEALS AND AT BEDTIME 60 tablet 0   No current facility-administered medications on file prior to visit.     Review of Systems  Constitutional: Negative.   HENT: Negative.    Eyes:  Positive for visual disturbance (going to see an eye doctor).  Respiratory: Negative.    Cardiovascular: Negative.   Gastrointestinal: Negative.   Genitourinary: Negative.   Musculoskeletal:        Left groin pain   Skin:        Skin growth eye lids  Neurological: Negative.   Psychiatric/Behavioral: Negative.        Objective:    BP 124/78 (BP Location: Right Arm, Patient Position: Sitting, Cuff Size: Normal)   Morgan 68   Temp 97.6 F (36.4 C) (Temporal)   Ht 5\' 4"  (1.626 m)   Wt 237 lb 6.4 oz (107.7 kg)   SpO2 96%   BMI 40.75 kg/m   Wt Readings from Last 3 Encounters:  04/11/22 237 lb 6.4 oz (107.7 kg)  03/31/22 238 lb 12.8 oz (108.3 kg)  03/29/22 230 lb (104.3 kg)    BP Readings from Last 3 Encounters:  04/11/22 124/78  03/31/22 132/76  03/29/22 (!) 145/85    Physical Exam Vitals and nursing note reviewed.  Constitutional:      General: She is not in acute distress.    Appearance: Normal appearance.  HENT:     Head: Normocephalic and atraumatic.     Right Ear: Tympanic membrane, ear canal and external ear normal.     Left Ear: Tympanic membrane, ear canal and external ear normal.  Eyes:     Conjunctiva/sclera: Conjunctivae normal.  Cardiovascular:     Rate and Rhythm: Normal rate and regular rhythm.     Pulses: Normal pulses.     Heart sounds: Normal heart sounds.  Pulmonary:     Effort: Pulmonary effort is normal.     Breath sounds: Normal breath sounds.  Abdominal:     Palpations: Abdomen is soft.     Tenderness: There is no abdominal tenderness.  Musculoskeletal:        General: No tenderness. Normal range of motion.  Cervical back: Normal range of motion and neck supple.     Right lower leg: No edema.     Left lower leg: No edema.  Lymphadenopathy:     Cervical: No cervical adenopathy.  Skin:    General: Skin is warm and dry.     Comments: Xanthelasma to bilateral eye lids  Neurological:     General: No focal deficit present.     Mental Status: She is alert and oriented to person, place, and time.     Cranial Nerves: No cranial nerve deficit.     Coordination: Coordination normal.     Gait: Gait normal.  Psychiatric:        Mood and Affect: Mood normal.        Behavior: Behavior normal.        Thought Content: Thought content normal.        Judgment: Judgment normal.       Assessment & Plan:   Problem List Items Addressed This Visit       Respiratory   Moderate persistent asthma without complication    Chronic, stable.  She is currently following with pulmonology.  Continue Breztri inhaler daily along with albuterol inhaler as needed.  She is also doing Nucala injections monthly.  Follow-up as symptoms worsen or with any concerns.  Prevnar 20 injection given today.      Relevant Orders   Pneumococcal conjugate vaccine 20-valent (Completed)     Musculoskeletal and Integument   Xanthelasma of eyelid, bilateral    Will check lipid panel today.  She does plan to schedule an appointment with an eye doctor.      Relevant Orders   Lipid panel     Other   Left groin pain - Primary    Ongoing intermittent left groin pain after she pulled a muscle a year ago.  She has no tenderness to palpation on exam.  Will have her start stretches to her hip and leg daily.  She can continue to take ibuprofen or Tylenol as needed for pain.  She can also use heat/ice.  Discussed weight loss as obesity can be triggering worsening pain.  Follow-up in 3 months.      Morbid obesity (HCC)    BMI 40.7.  Discussed nutrition, exercise.  She states that she gained weight when she was on prednisone for a while with  her asthma.      Other Visit Diagnoses     Screen for colon cancer       Discussed colon cancer screening options and she would like Cologuard, order placed   Relevant Orders   Cologuard   Encounter for hepatitis C screening test for low risk patient       Screen for hepatitis C today   Relevant Orders   Hepatitis C antibody   Need for pneumococcal 20-valent conjugate vaccination       Prevnar 20 given today due to history of asthma.   Relevant Orders   Pneumococcal conjugate vaccine 20-valent (Completed)        Follow up plan: Return in about 3 months (around 07/11/2022) for CPE.

## 2022-04-11 NOTE — Assessment & Plan Note (Signed)
Ongoing intermittent left groin pain after she pulled a muscle a year ago.  She has no tenderness to palpation on exam.  Will have her start stretches to her hip and leg daily.  She can continue to take ibuprofen or Tylenol as needed for pain.  She can also use heat/ice.  Discussed weight loss as obesity can be triggering worsening pain.  Follow-up in 3 months.

## 2022-04-12 LAB — HEPATITIS C ANTIBODY: Hepatitis C Ab: NONREACTIVE

## 2022-04-16 DIAGNOSIS — H5213 Myopia, bilateral: Secondary | ICD-10-CM | POA: Diagnosis not present

## 2022-04-17 DIAGNOSIS — Z419 Encounter for procedure for purposes other than remedying health state, unspecified: Secondary | ICD-10-CM | POA: Diagnosis not present

## 2022-05-11 LAB — COLOGUARD

## 2022-05-18 DIAGNOSIS — Z419 Encounter for procedure for purposes other than remedying health state, unspecified: Secondary | ICD-10-CM | POA: Diagnosis not present

## 2022-06-02 ENCOUNTER — Other Ambulatory Visit: Payer: Self-pay

## 2022-06-07 DIAGNOSIS — Z1211 Encounter for screening for malignant neoplasm of colon: Secondary | ICD-10-CM | POA: Diagnosis not present

## 2022-06-16 DIAGNOSIS — Z419 Encounter for procedure for purposes other than remedying health state, unspecified: Secondary | ICD-10-CM | POA: Diagnosis not present

## 2022-06-16 LAB — COLOGUARD: COLOGUARD: NEGATIVE

## 2022-07-17 DIAGNOSIS — Z419 Encounter for procedure for purposes other than remedying health state, unspecified: Secondary | ICD-10-CM | POA: Diagnosis not present

## 2022-07-18 NOTE — Progress Notes (Deleted)
   Established Patient Office Visit  Subjective   Patient ID: Wendy Morgan, female    DOB: 1975/09/28  Age: 47 y.o. MRN: [id number]  No chief complaint on file.   HPI  BRISHAE LANCASTER is here to follow-up on left groin pain.  {History (Optional):23778}  ROS    Objective:     There were no vitals taken for this visit. {Vitals History (Optional):23777}  Physical Exam   No results found for any visits on 07/19/22.  {Labs (Optional):23779}  The 10-year ASCVD risk score (Arnett DK, et al., 2019) is: 1.1%    Assessment & Plan:   Problem List Items Addressed This Visit   None   No follow-ups on file.    Charyl Dancer, NP

## 2022-07-18 NOTE — Progress Notes (Deleted)
Synopsis: Referred for severe persistent asthma by Charyl Dancer, NP  Subjective:   PATIENT ID: Wendy Morgan GENDER: female DOB: 06/21/1975, MRN: [id number]  No chief complaint on file.  47yF with history of severe persistent asthma on symbicort 160 2 puffs twice daily without spacer, singulair, zyrtec, CRS taking flonase previously followed by Dr. Joya Gaskins.   Finished prednisone taper a couple weeks ago. 6+ times has needed prednisone which does relieve her dyspnea. Dyspnea is most prominent symptom when asthma active, does also have some cough with it, productive. She has been on symbicort for the last 3 weeks. Had been on Qvar.   She does have reflux but only when she takes prednisone.   She has no family history of lung disease  She works doing in home care, as a Press photographer. She has no pets. Very limited remote smoking.   Interval HPI  Started on nucala last visit  Breztri 2 puffs bid rinsing mouth. Hasn't needed albuterol at all. No sinus congestoin, postnasal drainage.   Off of singulair, flonase, zyrtec ------------------------------------------------ Continued on breztri, nucala last visit  Otherwise pertinent review of systems is negative.  Past Medical History:  Diagnosis Date   Asthma    Bronchiectasis with acute exacerbation ([place])    Class 1 obesity 04/11/2021     Family History  Problem Relation Age of Onset   Healthy Mother    Healthy Father    Kidney disease Neg Hx    Anxiety disorder Neg Hx    Depression Neg Hx      Past Surgical History:  Procedure Laterality Date   TUBAL LIGATION      Social History   Socioeconomic History   Marital status: Single    Spouse name: Not on file   Number of children: 4   Years of education: Not on file   Highest education level: Not on file  Occupational History   Not on file  Tobacco Use   Smoking status: Former    Packs/day: 0.25    Years: 6.00    Additional pack years: 0.00    Total pack years:  1.50    Types: Cigarettes    Quit date: 81    Years since quitting: 26.2    Passive exposure: Never   Smokeless tobacco: Never  Vaping Use   Vaping Use: Never used  Substance and Sexual Activity   Alcohol use: No   Drug use: No   Sexual activity: Yes    Birth control/protection: Surgical  Other Topics Concern   Not on file  Social History Narrative   Not on file   Social Determinants of Health   Financial Resource Strain: Not on file  Food Insecurity: Not on file  Transportation Needs: Not on file  Physical Activity: Not on file  Stress: Not on file  Social Connections: Not on file  Intimate Partner Violence: Not on file     No Known Allergies   Outpatient Medications Prior to Visit  Medication Sig Dispense Refill   albuterol (PROVENTIL) (2.5 MG/3ML) 0.083% nebulizer solution Take 3 mLs (2.5 mg total) by nebulization every 4 (four) hours as needed for wheezing or shortness of breath. (Patient not taking: Reported on 04/11/2022) 90 mL 0   albuterol (VENTOLIN HFA) 108 (90 Base) MCG/ACT inhaler Inhale 1-2 puffs into the lungs every 6 (six) hours as needed for wheezing or shortness of breath. 18 g 1   Budeson-Glycopyrrol-Formoterol (BREZTRI AEROSPHERE) 160-9-4.8 MCG/ACT AERO Inhale 2 puffs into  the lungs in the morning and at bedtime. 5.9 g 0   Mepolizumab (NUCALA) 100 MG/ML SOAJ Inject 1 mL (100 mg total) into the skin every 28 (twenty-eight) days. 1 mL 5   No facility-administered medications prior to visit.       Objective:   Physical Exam:  General appearance: 47 y.o., female, NAD, conversant  Eyes: anicteric sclerae; PERRL, tracking appropriately HENT: NCAT; MMM Neck: Trachea midline; no lymphadenopathy, no JVD Lungs: ctab, with normal respiratory effort CV: RRR, no murmur  Abdomen: Soft, non-tender; non-distended, BS present  Extremities: No peripheral edema, warm Skin: Normal turgor and texture; no rash Psych: Appropriate affect Neuro: Alert and oriented  to person and place, no focal deficit     There were no vitals filed for this visit.     on RA BMI Readings from Last 3 Encounters:  04/11/22 40.75 kg/m  03/31/22 40.99 kg/m  03/29/22 39.48 kg/m   Wt Readings from Last 3 Encounters:  04/11/22 237 lb 6.4 oz (107.7 kg)  03/31/22 238 lb 12.8 oz (108.3 kg)  03/29/22 230 lb (104.3 kg)     CBC    Component Value Date/Time   WBC 11.1 (H) 10/20/2021 2114   RBC 4.61 10/20/2021 2114   HGB 13.4 10/20/2021 2114   HGB 14.3 06/09/2019 1051   HCT 40.1 10/20/2021 2114   HCT 41.8 06/09/2019 1051   PLT 364 10/20/2021 2114   PLT 342 06/09/2019 1051   MCV 87.0 10/20/2021 2114   MCV 87 06/09/2019 1051   MCH 29.1 10/20/2021 2114   MCHC 33.4 10/20/2021 2114   RDW 14.6 10/20/2021 2114   RDW 13.3 06/09/2019 1051   LYMPHSABS 1.9 08/17/2021 1325   MONOABS 0.9 08/17/2021 1325   EOSABS 0.9 (H) 08/17/2021 1325   EOSABS 0.3 07/22/2019 0932   BASOSABS 0.0 08/17/2021 1325    Eos [phone]  IgE 67 in 2021  Chest Imaging: CT Chest 07/25/19 reviewed by me with borderline bronchiectasis  CXR 08/17/21 reviewed by me stable  Pulmonary Functions Testing Results:     No data to display            Assessment & Plan:   # Severe 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 This has all cleared up remarkably with addition of nucala.  Plan: - breztri prn - albuterol as needed - nucala - see you in 3 months or sooner if need be!    Maryjane Hurter, MD [facility] 07/18/2022 6:42 AM

## 2022-07-19 ENCOUNTER — Telehealth: Payer: Self-pay | Admitting: Nurse Practitioner

## 2022-07-19 ENCOUNTER — Ambulatory Visit: Payer: Medicaid Other | Admitting: Nurse Practitioner

## 2022-07-19 ENCOUNTER — Ambulatory Visit: Payer: Medicaid Other | Admitting: Student

## 2022-07-19 NOTE — Telephone Encounter (Signed)
4.3.24 no show letter sent

## 2022-07-19 NOTE — Telephone Encounter (Signed)
Noted  

## 2022-07-19 NOTE — Telephone Encounter (Signed)
1st no show, fee waived, letter sent to reschedule/notify of policy 

## 2022-07-20 ENCOUNTER — Emergency Department (HOSPITAL_COMMUNITY)
Admission: EM | Admit: 2022-07-20 | Discharge: 2022-07-21 | Disposition: A | Discharge status: Home / Self Care | Payer: Medicaid Other | Attending: Emergency Medicine | Admitting: Emergency Medicine

## 2022-07-20 DIAGNOSIS — J45909 Unspecified asthma, uncomplicated: Secondary | ICD-10-CM | POA: Diagnosis not present

## 2022-07-20 DIAGNOSIS — M79605 Pain in left leg: Secondary | ICD-10-CM | POA: Diagnosis not present

## 2022-07-20 NOTE — ED Triage Notes (Signed)
R leg pain for months. Pain has gotten bad past few days. Takes ibuprofen daily. Pain is tingling, numbness, cramping sensation. Denies fecal or urinary incontinence

## 2022-07-21 ENCOUNTER — Telehealth: Payer: Self-pay

## 2022-07-21 ENCOUNTER — Telehealth: Payer: Medicaid Other | Admitting: Nurse Practitioner

## 2022-07-21 ENCOUNTER — Ambulatory Visit (HOSPITAL_COMMUNITY)
Admission: RE | Admit: 2022-07-21 | Discharge: 2022-07-21 | Disposition: A | Discharge status: Home / Self Care | Payer: Medicaid Other | Source: Ambulatory Visit | Attending: Emergency Medicine | Admitting: Emergency Medicine

## 2022-07-21 DIAGNOSIS — M79605 Pain in left leg: Secondary | ICD-10-CM | POA: Diagnosis not present

## 2022-07-21 DIAGNOSIS — M79606 Pain in leg, unspecified: Secondary | ICD-10-CM

## 2022-07-21 MED ORDER — KETOROLAC TROMETHAMINE 30 MG/ML IJ SOLN
30.0000 mg | Freq: Once | INTRAMUSCULAR | Status: AC
  Administered 2022-07-21: 30 mg via INTRAMUSCULAR
  Filled 2022-07-21: qty 1

## 2022-07-21 NOTE — Progress Notes (Signed)
Because this is a chronic condition and will likely require more of a workup and more high level pain management than we can provide over virtual services , I feel your condition warrants further evaluation and I recommend that you be seen in a face to face visit.   NOTE: There will be NO CHARGE for this eVisit   If you are having a true medical emergency please call 911.      For an urgent face to face visit, [place] has eight urgent care centers for your convenience:   NEW!! [facility]gent Care Center at [facility] Get Driving Directions [phone] [address] [place], [address]    [facility] Health Urgent Care Center at [facility] Get Driving Directions [phone] [address] [place], [place] [address]   [facility] Health Urgent Care Center [facility]) Get Driving Directions [phone] [address] [place], [place] [address]  [facility] Health Urgent Care Center [facility]) Get Driving Directions [phone] [address] [place],  [place]  [address]  [facility] Health Urgent Care Center [facility] - at [employer]  [phone] [address] [place],  [place] [address]   [facility] Health Urgent Care at [facility] Get Driving Directions [phone] [address] [place], [place] [address]   [facility] Health Urgent Care at [facility] Get Driving Directions  [phone] [address] [place], [place] [address]   [facility] Health Urgent Care at [facility] Directions [phone] [address] [place], [place] [address]  Your MyChart E-visit questionnaire answers were reviewed by a board certified advanced clinical practitioner to complete your personal care plan based on your specific symptoms.  Thank you for using e-Visits.

## 2022-07-21 NOTE — Transitions of Care (Post Inpatient/ED Visit) (Signed)
   07/21/2022  Name: Wendy Morgan MRN: [id number] DOB: 24-Feb-1976  Today's TOC FU Call Status: Today's TOC FU Call Status:: Unsuccessul Call (1st Attempt)  Attempted to reach the patient regarding the most recent Inpatient/ED visit.  Follow Up Plan: Additional outreach attempts will be made to reach the patient to complete the Transitions of Care (Post Inpatient/ED visit) call.   Signature Olga Coaster, CMA

## 2022-07-21 NOTE — Transitions of Care (Post Inpatient/ED Visit) (Signed)
   07/21/2022  Name: EUDELL HOLZKNECHT MRN: [id number] DOB: 11/08/1975  Today's TOC FU Call Status: Today's TOC FU Call Status:: Successful TOC FU Call Competed TOC FU Call Complete Date: 07/21/22  Transition Care Management Follow-up Telephone Call Date of Discharge: 07/21/22 Discharge Facility[facility]) Type of Discharge: Emergency Department Reason for ED Visit: Other: How have you been since you were released from the hospital?: Same Any questions or concerns?: No  Items Reviewed: Did you receive and understand the discharge instructions provided?: Yes Medications obtained and verified?: No Any new allergies since your discharge?: No Dietary orders reviewed?: NA  Home Care and Equipment/Supplies:    Functional Questionnaire:    Follow up appointments reviewed: PCP Follow-up appointment confirmed?: Yes Date of PCP follow-up appointment?: 07/28/22 Follow-up Provider: Rodman Pickle    SIGNATURE Olga Coaster, cma

## 2022-07-21 NOTE — ED Provider Notes (Signed)
[place] EMERGENCY DEPARTMENT AT [facility] Provider Note   CSN: [id number] Arrival date & time: 07/20/22  2015     History  Chief Complaint  Patient presents with   Leg Pain    Wendy Morgan is a 47 y.o. female.  HPI     This is a 47 year old female who presents with left leg pain.  Patient reports she has had left leg pain for "some time."  She believes it has been up to 1 year.  She thinks she may have pulled something in her groin.  She also states at some point she twisted her ankle.  She has pain that radiates from her upper thigh into the posterior aspect of her knee and down to her ankle.  She has not noted any swelling.  No history of blood clots.  She is not on any estrogen products.  Home Medications Prior to Admission medications   Medication Sig Start Date End Date Taking? Authorizing Provider  albuterol (PROVENTIL) (2.5 MG/3ML) 0.083% nebulizer solution Take 3 mLs (2.5 mg total) by nebulization every 4 (four) hours as needed for wheezing or shortness of breath. Patient not taking: Reported on 04/11/2022 09/19/21   Dione Booze, MD  albuterol (VENTOLIN HFA) 108 (90 Base) MCG/ACT inhaler Inhale 1-2 puffs into the lungs every 6 (six) hours as needed for wheezing or shortness of breath. 08/16/21   Claiborne Rigg, NP  Budeson-Glycopyrrol-Formoterol (BREZTRI AEROSPHERE) 160-9-4.8 MCG/ACT AERO Inhale 2 puffs into the lungs in the morning and at bedtime. 10/24/21   Omar Person, MD  Mepolizumab (NUCALA) 100 MG/ML SOAJ Inject 1 mL (100 mg total) into the skin every 28 (twenty-eight) days. 12/29/21   Omar Person, MD  esomeprazole (NEXIUM) 40 MG capsule TAKE 1 CAPSULE BY MOUTH DAILY 08/17/21 08/18/21  Charlynne Pander, MD  sucralfate (CARAFATE) 1 g tablet TAKE 1 TABLET BY MOUTH 4 TIMES DAILY-WITH MEALS AND AT BEDTIME 08/17/21 08/18/21  Charlynne Pander, MD      Allergies    Patient has no known allergies.    Review of Systems   Review of Systems   Cardiovascular:  Negative for leg swelling.  Musculoskeletal:        Left leg pain  All other systems reviewed and are negative.   Physical Exam Updated Vital Signs BP 122/78   Pulse 75   Temp 98.3 F (36.8 C) (Oral)   Resp 20   SpO2 99%  Physical Exam Vitals and nursing note reviewed.  Constitutional:      Appearance: She is well-developed. She is obese.  HENT:     Head: Normocephalic and atraumatic.  Eyes:     Pupils: Pupils are equal, round, and reactive to light.  Cardiovascular:     Rate and Rhythm: Normal rate and regular rhythm.  Pulmonary:     Effort: Pulmonary effort is normal. No respiratory distress.  Abdominal:     Palpations: Abdomen is soft.  Musculoskeletal:        General: No swelling, tenderness or deformity. Normal range of motion.     Cervical back: Neck supple.     Comments: No significant tenderness to palpation or swelling, normal range of motion of the hip, knee, ankle, tenderness popliteal fossa, 2+ DP pulse, neurovascular intact distally  Skin:    General: Skin is warm and dry.  Neurological:     Mental Status: She is alert and oriented to person, place, and time.  Psychiatric:  Mood and Affect: Mood normal.     ED Results / Procedures / Treatments   Labs (all labs ordered are listed, but only abnormal results are displayed) Labs Reviewed - No data to display  EKG None  Radiology No results found.  Procedures Procedures    Medications Ordered in ED Medications  ketorolac (TORADOL) 30 MG/ML injection 30 mg (30 mg Intramuscular Given 07/21/22 0318)    ED Course/ Medical Decision Making/ A&P                             Medical Decision Making Risk Prescription drug management.   This patient presents to the ED for concern of leg pain, this involves an extensive number of treatment options, and is a complaint that carries with it a high risk of complications and morbidity.  I considered the following differential and  admission for this acute, potentially life threatening condition.  The differential diagnosis includes myalgia, strain, DVT  MDM:    This is a 47 year old female who presents with left leg pain.  Pain is chronic.  Reports may be some remote injuries but nothing new.  She has no deformities.  She has normal range of motion.  There is no asymmetric swelling.  There is no skin changes and she is neurovascularly intact.  Do not feel she needs imaging tonight although I do feel that chronic DVT is a consideration.  Will order DVT study for later.  In the meantime recommend NSAIDs.  (Labs, imaging, consults)  Labs: I Ordered, and personally interpreted labs.  The pertinent results include: None  Imaging Studies ordered: I ordered imaging studies including DVT study ordered I independently visualized and interpreted imaging. I agree with the radiologist interpretation  Additional history obtained from chart review.  External records from outside source obtained and reviewed including prior evaluations  Cardiac Monitoring: The patient was not maintained on a cardiac monitor.  If on the cardiac monitor, I personally viewed and interpreted the cardiac monitored which showed an underlying rhythm of: N/A  Reevaluation: After the interventions noted above, I reevaluated the patient and found that they have :stayed the same  Social Determinants of Health:  lives independently  Disposition: Discharge  Co morbidities that complicate the patient evaluation  Past Medical History:  Diagnosis Date   Asthma    Bronchiectasis with acute exacerbation ([facility])    Class 1 obesity 04/11/2021     Medicines Meds ordered this encounter  Medications   ketorolac (TORADOL) 30 MG/ML injection 30 mg    I have reviewed the patients home medicines and have made adjustments as needed  Problem List / ED Course: Problem List Items Addressed This Visit   None Visit Diagnoses     Left leg pain    -  Primary                    Final Clinical Impression(s) / ED Diagnoses Final diagnoses:  Left leg pain    Rx / DC Orders ED Discharge Orders          Ordered    US Venous Img Lower Unilateral Left (DVT)  Status:  Canceled        07/21/22 0341    LE VENOUS        07/21/22 0342              Shon BatonHorton, Carrye Goller F, MD 07/21/22 [id number]

## 2022-07-21 NOTE — Discharge Instructions (Signed)
You are seen today for leg pain.  Return for ultrasound.  In the meantime, continue ibuprofen.

## 2022-07-27 ENCOUNTER — Ambulatory Visit (INDEPENDENT_AMBULATORY_CARE_PROVIDER_SITE_OTHER): Payer: Medicaid Other | Admitting: Nurse Practitioner

## 2022-07-27 ENCOUNTER — Encounter: Payer: Self-pay | Admitting: Nurse Practitioner

## 2022-07-27 VITALS — BP 126/90 | HR 65 | Temp 98.2°F | Ht 64.0 in | Wt 233.4 lb

## 2022-07-27 DIAGNOSIS — R1032 Left lower quadrant pain: Secondary | ICD-10-CM

## 2022-07-27 MED ORDER — MELOXICAM 15 MG PO TABS: 15.0000 mg | ORAL_TABLET | Freq: Every day | ORAL | 0 refills | Status: DC

## 2022-07-27 MED ORDER — KETOROLAC TROMETHAMINE 60 MG/2ML IM SOLN
30.0000 mg | Freq: Once | INTRAMUSCULAR | Status: AC
  Administered 2022-07-27: 30 mg via INTRAMUSCULAR

## 2022-07-27 NOTE — Progress Notes (Signed)
   Established Patient Office Visit  Subjective   Patient ID: Wendy Morgan, female    DOB: 08/23/1975  Age: 47 y.o. MRN: [id number]  Chief Complaint  Patient presents with   Left Groin Pain     HPI  Wendy Morgan is here to follow-up on left groin pain.  She states that the pain has gotten worse over the last few months.  She went to the emergency room and got a Toradol injection which helped with the pain slightly.  She had an ultrasound that was negative for DVT.  She states the pain starts in her groin and radiates down to her knee and describes it as sharp.  She has worsening pains when she twists, walks, moves her leg.  She states that she needs to sit on her right hip when she is sitting so that the left leg does not hurt as much.  She has been taking ibuprofen which does not seem to help it.    ROS See pertinent positives and negatives per HPI.    Objective:     BP (!) 126/90 (BP Location: Right Arm)   Pulse 65   Temp 98.2 F (36.8 C)   Ht 5\' 4"  (1.626 m)   Wt 233 lb 6.4 oz (105.9 kg)   LMP 07/25/2022   SpO2 99%   BMI 40.06 kg/m    Physical Exam Vitals and nursing note reviewed.  Constitutional:      General: She is not in acute distress.    Appearance: Normal appearance.  HENT:     Head: Normocephalic.  Eyes:     Conjunctiva/sclera: Conjunctivae normal.  Pulmonary:     Effort: Pulmonary effort is normal.  Musculoskeletal:        General: Tenderness present. No swelling. Normal range of motion.     Cervical back: Normal range of motion.  Skin:    General: Skin is warm.  Neurological:     General: No focal deficit present.     Mental Status: She is alert and oriented to person, place, and time.  Psychiatric:        Mood and Affect: Mood normal.        Behavior: Behavior normal.        Thought Content: Thought content normal.        Judgment: Judgment normal.    The 10-year ASCVD risk score (Arnett DK, et al., 2019) is: 1.2%    Assessment &  Plan:   Problem List Items Addressed This Visit       Other   Left groin pain - Primary    Chronic, not controlled.  She had an x-ray of her left femur which was negative.  She also had an ultrasound that ruled out DVT.  The pain has gotten worse over the last few months.  Will give her Toradol 30 mg IM in office today and start meloxicam 15 mg daily with food.  Continue doing stretches.  With ongoing pain, will place referral to orthopedics.      Relevant Orders   Ambulatory referral to Orthopedic Surgery    Return in about 3 months (around 10/26/2022) for CPE.    Gerre Scull, NP

## 2022-07-27 NOTE — Assessment & Plan Note (Addendum)
Chronic, not controlled.  She had an x-ray of her left femur which was negative.  She also had an ultrasound that ruled out DVT.  The pain has gotten worse over the last few months.  Will give her Toradol 30 mg IM in office today and start meloxicam 15 mg daily with food.  Continue doing stretches.  With ongoing pain, will place referral to orthopedics.

## 2022-07-27 NOTE — Patient Instructions (Signed)
It was great to see you!  Start meloxicam 1 tablet daily with food for your pain. You can take tylenol as needed as well.   We are giving you a toradol injection today.   I have placed a referral to orthopedics, they will call to schedule.   Let's follow-up in 3 months, sooner if you have concerns.  If a referral was placed today, you will be contacted for an appointment. Please note that routine referrals can sometimes take up to 3-4 weeks to process. Please call our office if you haven't heard anything after this time frame.  Take care,  Rodman Pickle, NP

## 2022-07-28 ENCOUNTER — Ambulatory Visit: Payer: Medicaid Other | Admitting: Nurse Practitioner

## 2022-08-01 ENCOUNTER — Other Ambulatory Visit (INDEPENDENT_AMBULATORY_CARE_PROVIDER_SITE_OTHER): Payer: Medicaid Other

## 2022-08-01 ENCOUNTER — Ambulatory Visit (INDEPENDENT_AMBULATORY_CARE_PROVIDER_SITE_OTHER): Payer: Medicaid Other | Admitting: Physician Assistant

## 2022-08-01 DIAGNOSIS — R1032 Left lower quadrant pain: Secondary | ICD-10-CM

## 2022-08-01 MED ORDER — TRAMADOL HCL 50 MG PO TABS: 50.0000 mg | ORAL_TABLET | Freq: Two times a day (BID) | ORAL | 0 refills | Status: DC | PRN

## 2022-08-01 NOTE — Progress Notes (Signed)
X-rays demonstrate  Office Visit Note   Patient: Wendy Morgan           Date of Birth: 08/13/1975           MRN: [id number] Visit Date: 08/01/2022              Requested by: Gerre Scull, NP [address] [place],  [place] [address] PCP: Gerre Scull, NP   Assessment & Plan: Visit Diagnoses:  1. Left groin pain     Plan: Impression is left hip impingement syndrome.  At this point, discussed referral to Dr. Shon Baton for ultrasound-guided intra-articular cortisone injection for which she is agreeable to.  Have also sent in a prescription for tramadol.  She will follow-up with [place] as needed.  Follow-Up Instructions: Return if symptoms worsen or fail to improve.   Orders:  Orders Placed This Encounter  Procedures   XR HIP UNILAT W OR W/O PELVIS 2-3 VIEWS LEFT   AMB referral to sports medicine   Meds ordered this encounter  Medications   traMADol (ULTRAM) 50 MG tablet    Sig: Take 1 tablet (50 mg total) by mouth every 12 (twelve) hours as needed.    Dispense:  30 tablet    Refill:  0      Procedures: No procedures performed   Clinical Data: No additional findings.   Subjective: Chief Complaint  Patient presents with   Left Hip - Pain    HPI patient is a pleasant 47 year old female who comes in today with left hip pain for the past 3 to 4 months.  She denies any specific injury but notes her symptoms have worsened.  The pain is primarily to the groin but radiates to the anterior thigh all the way to the knee.  This is constant but worse when she is lying down, sitting or standing for a long time.  She has been taking meloxicam without significant relief.  No previous cortisone injection to the left hip joint.  Review of Systems as detailed in HPI.  All others reviewed and are negative.   Objective: Vital Signs: LMP 07/25/2022   Physical Exam well-developed well-nourished female no acute distress.  Alert and oriented x 3.  Ortho Exam left hip exam  shows no pain with logroll.  She has significant pain with FADIR test and Stinchfield testing.  She is neurovascularly intact distally.  Specialty Comments:  No specialty comments available.  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   Moderate persistent asthma without complication 04/11/2022   Morbid obesity 04/11/2022   Precordial chest pain 12/19/2021   Asthma, chronic, moderate persistent, with acute exacerbation 05/04/2021   Acute recurrent maxillary sinusitis 07/25/2019   Allergic rhinitis with postnasal drip 07/22/2019   Acute recurrent pansinusitis 07/22/2019   Dental caries 07/22/2019   Past Medical History:  Diagnosis Date   Asthma    Bronchiectasis with acute exacerbation    Class 1 obesity 04/11/2021    Family History  Problem Relation Age of Onset   Healthy Mother    Healthy Father    Kidney disease Neg Hx    Anxiety disorder Neg Hx    Depression Neg Hx     Past Surgical  History:  Procedure Laterality Date   TUBAL LIGATION     Social History   Occupational History   Not on file  Tobacco Use   Smoking status: Former    Packs/day: 0.25    Years: 6.00    Additional pack years: 0.00    Total pack years: 1.50    Types: Cigarettes    Quit date: 39    Years since quitting: 26.3    Passive exposure: Never   Smokeless tobacco: Never  Vaping Use   Vaping Use: Never used  Substance and Sexual Activity   Alcohol use: No   Drug use: No   Sexual activity: Yes    Birth control/protection: Surgical

## 2022-08-02 ENCOUNTER — Ambulatory Visit (INDEPENDENT_AMBULATORY_CARE_PROVIDER_SITE_OTHER): Payer: Medicaid Other | Admitting: Sports Medicine

## 2022-08-02 ENCOUNTER — Other Ambulatory Visit: Payer: Medicaid Other

## 2022-08-02 ENCOUNTER — Encounter: Payer: Self-pay | Admitting: Sports Medicine

## 2022-08-02 DIAGNOSIS — M25852 Other specified joint disorders, left hip: Secondary | ICD-10-CM | POA: Diagnosis not present

## 2022-08-02 DIAGNOSIS — M25552 Pain in left hip: Secondary | ICD-10-CM

## 2022-08-02 MED ORDER — LIDOCAINE HCL 1 % IJ SOLN
4.0000 mL | INTRAMUSCULAR | Status: AC | PRN
  Administered 2022-08-02: 4 mL

## 2022-08-02 MED ORDER — METHYLPREDNISOLONE ACETATE 40 MG/ML IJ SUSP
80.0000 mg | INTRAMUSCULAR | Status: AC | PRN
  Administered 2022-08-02: 80 mg via INTRA_ARTICULAR

## 2022-08-02 NOTE — Progress Notes (Signed)
Patient was instructed in 10 minutes of therapeutic exercises for left hip to improve strength, ROM and function according to my instructions and plan of care by a Certified Athletic Trainer during the office visit. A customized handout was provided and demonstration of proper technique shown and discussed. Patient did perform exercises and demonstrate understanding through teachback.  All questions discussed and answered.  

## 2022-08-02 NOTE — Progress Notes (Signed)
   Procedure Note  Patient: Wendy Morgan             Date of Birth: 15-Aug-1975           MRN: [id number]             Visit Date: 08/02/2022  Procedures: Visit Diagnoses:  1. Pain in left hip   2. Hip impingement syndrome, left    Large Joint Inj: L hip joint on 08/02/2022 9:33 AM Indications: pain Details: 22 G 3.5 in needle, ultrasound-guided anterior approach Medications: 4 mL lidocaine 1 %; 80 mg methylPREDNISolone acetate 40 MG/ML Outcome: tolerated well, no immediate complications  Procedure: US-guided intra-articular hip injection, left After discussion on risks/benefits/indications and informed verbal consent was obtained, a timeout was performed. Patient was lying supine on exam table. The hip was cleaned with betadine and alcohol swabs. Then utilizing ultrasound guidance, the patient's femoral head and neck junction was identified and subsequently injected with 4:2 lidocaine:depomedrol via an in-plane approach with ultrasound visualization of the injectate administered into the hip joint. Patient tolerated procedure well without immediate complications.  Procedure, treatment alternatives, risks and benefits explained, specific risks discussed. Consent was given by the patient. Immediately prior to procedure a time out was called to verify the correct patient, procedure, equipment, support staff and site/side marked as required. Patient was prepped and draped in the usual sterile fashion.    - I evaluated the patient about 10 minutes post-injection and they had improvement in pain and range of motion -We did go through a home exercise plan for the left hip today.  Customized handout was printed out for her and my athletic trainer, Lequita Halt did review these in the room with her.  I would like to have her give his 48 to 72 hours of modified activity and rest, she may then begin the hip exercises once daily - follow-up with Mardella Layman and Dr. Roda Shutters as indicated; I am happy to see them as  needed  Madelyn Brunner, DO Primary Care Sports Medicine Physician  [facility]  This note was dictated using Dragon naturally speaking software and may contain errors in syntax, spelling, or content which have not been identified prior to signing this note.

## 2022-08-06 NOTE — Progress Notes (Unsigned)
Synopsis: Referred for severe persistent asthma by Gerre Scull, NP  Subjective:   PATIENT ID: Wendy Morgan GENDER: female DOB: [birth year], MRN: [id number]  No chief complaint on file.  47yF with history of severe persistent asthma on symbicort 160 2 puffs twice daily without spacer, singulair, zyrtec, CRS taking flonase previously followed by Dr. Delford Field.   Finished prednisone taper a couple weeks ago. 6+ times has needed prednisone which does relieve her dyspnea. Dyspnea is most prominent symptom when asthma active, does also have some cough with it, productive. She has been on symbicort for the last 3 weeks. Had been on Qvar.   She does have reflux but only when she takes prednisone.   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 Very limited remote smoking.   Interval HPI  Started on nucala last visit  Breztri 2 puffs bid rinsing mouth. Hasn't needed albuterol at all. No sinus congestoin, postnasal drainage.   Off of singulair, flonase, zyrtec ------------------------------------------------ Continued on breztri, nucala last visit  PFT  Otherwise pertinent review of systems is negative.  Past Medical History:  Diagnosis Date   Asthma    Bronchiectasis with acute exacerbation    Class 1 obesity 04/11/2021     Family History  Problem Relation Age of Onset   Healthy Mother    Healthy Father    Kidney disease Neg Hx    Anxiety disorder Neg Hx    Depression Neg Hx      Past Surgical History:  Procedure Laterality Date   TUBAL LIGATION      Social History   Socioeconomic History   Marital status: Single    Spouse name: Not on file   Number of children: 4   Years of education: Not on file   Highest education level: Not on file  Occupational History   Not on file  Tobacco Use   Smoking status: Former    Packs/day: 0.25    Years: 6.00    Additional pack years: 0.00    Total pack  years: 1.50    Types: Cigarettes    Quit date: 51    Years since quitting: 26.3    Passive exposure: Never   Smokeless tobacco: Never  Vaping Use   Vaping Use: Never used  Substance and Sexual Activity   Alcohol use: No   Drug use: No   Sexual activity: Yes    Birth control/protection: Surgical  Other Topics Concern   Not on file  Social History Narrative   Not on file   Social Determinants of Health   Financial Resource Strain: Not on file  Food Insecurity: No Food Insecurity (07/21/2022)   Hunger Vital Sign    Worried About Running Out of Food in the Last Year: Never true    Ran Out of Food in the Last Year: Never true  Transportation Needs: No Transportation Needs (07/21/2022)   PRAPARE - Administrator, Civil Service (Medical): No    Lack of Transportation (Non-Medical): No  Physical Activity: Not on file  Stress: Not on file  Social Connections: Not on file  Intimate Partner Violence: Not At Risk (07/21/2022)   Humiliation, Afraid, Rape, and Kick questionnaire    Fear of Current or Ex-Partner: No    Emotionally Abused: No    Physically Abused: No    Sexually Abused: No     No Known Allergies  Outpatient Medications Prior to Visit  Medication Sig Dispense Refill   albuterol (PROVENTIL) (2.5 MG/3ML) 0.083% nebulizer solution Take 3 mLs (2.5 mg total) by nebulization every 4 (four) hours as needed for wheezing or shortness of breath. 90 mL 0   albuterol (VENTOLIN HFA) 108 (90 Base) MCG/ACT inhaler Inhale 1-2 puffs into the lungs every 6 (six) hours as needed for wheezing or shortness of breath. 18 g 1   Budeson-Glycopyrrol-Formoterol (BREZTRI AEROSPHERE) 160-9-4.8 MCG/ACT AERO Inhale 2 puffs into the lungs in the morning and at bedtime. 5.9 g 0   ibuprofen (ADVIL) 800 MG tablet Take 800 mg by mouth every 8 (eight) hours as needed.     meloxicam (MOBIC) 15 MG tablet Take 1 tablet (15 mg total) by mouth daily. 30 tablet 0   Mepolizumab (NUCALA) 100 MG/ML SOAJ  Inject 1 mL (100 mg total) into the skin every 28 (twenty-eight) days. (Patient not taking: Reported on 07/27/2022) 1 mL 5   traMADol (ULTRAM) 50 MG tablet Take 1 tablet (50 mg total) by mouth every 12 (twelve) hours as needed. 30 tablet 0   No facility-administered medications prior to visit.       Objective:   Physical Exam:  General appearance: 47 y.o., female, NAD, conversant  Eyes: anicteric sclerae; PERRL, tracking appropriately HENT: NCAT; MMM Neck: Trachea midline; no lymphadenopathy, no JVD Lungs: ctab, with normal respiratory effort CV: RRR, no murmur  Abdomen: Soft, non-tender; non-distended, BS present  Extremities: No peripheral edema, warm Skin: Normal turgor and texture; no rash Psych: Appropriate affect Neuro: Alert and oriented to person and place, no focal deficit     There were no vitals filed for this visit.     on RA BMI Readings from Last 3 Encounters:  07/27/22 40.06 kg/m  04/11/22 40.75 kg/m  03/31/22 40.99 kg/m   Wt Readings from Last 3 Encounters:  07/27/22 233 lb 6.4 oz (105.9 kg)  04/11/22 237 lb 6.4 oz (107.7 kg)  03/31/22 238 lb 12.8 oz (108.3 kg)     CBC    Component Value Date/Time   WBC 11.1 (H) 10/20/2021 2114   RBC 4.61 10/20/2021 2114   HGB 13.4 10/20/2021 2114   HGB 14.3 06/09/2019 1051   HCT 40.1 10/20/2021 2114   HCT 41.8 06/09/2019 1051   PLT 364 10/20/2021 2114   PLT 342 06/09/2019 1051   MCV 87.0 10/20/2021 2114   MCV 87 06/09/2019 1051   MCH 29.1 10/20/2021 2114   MCHC 33.4 10/20/2021 2114   RDW 14.6 10/20/2021 2114   RDW 13.3 06/09/2019 1051   LYMPHSABS 1.9 08/17/2021 1325   MONOABS 0.9 08/17/2021 1325   EOSABS 0.9 (H) 08/17/2021 1325   EOSABS 0.3 07/22/2019 0932   BASOSABS 0.0 08/17/2021 1325    Eos 262 313 1095  IgE 67 in 2021  Chest Imaging: CT Chest 07/25/19 reviewed by me with borderline bronchiectasis  CXR 08/17/21 reviewed by me stable  Pulmonary Functions Testing Results:     No data to display             Assessment & Plan:   # Severe 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 This has all cleared up remarkably with addition of nucala.  Plan: - breztri prn - albuterol as needed - nucala - see you in 3 months or sooner if need be!    Omar Person, MD [facility]  08/06/2022 6:26 PM

## 2022-08-07 ENCOUNTER — Ambulatory Visit (INDEPENDENT_AMBULATORY_CARE_PROVIDER_SITE_OTHER): Payer: Medicaid Other | Admitting: Student

## 2022-08-07 ENCOUNTER — Telehealth: Payer: Self-pay | Admitting: Student

## 2022-08-07 ENCOUNTER — Telehealth: Payer: Self-pay | Admitting: Pharmacist

## 2022-08-07 ENCOUNTER — Encounter: Payer: Self-pay | Admitting: Student

## 2022-08-07 VITALS — BP 136/74 | HR 73 | Temp 97.9°F | Ht 64.0 in | Wt 237.0 lb

## 2022-08-07 DIAGNOSIS — J4550 Severe persistent asthma, uncomplicated: Secondary | ICD-10-CM

## 2022-08-07 LAB — PULMONARY FUNCTION TEST
DL/VA % pred: 118 %
DL/VA: 5.14 ml/min/mmHg/L
DLCO cor % pred: 97 %
DLCO cor: 20.83 ml/min/mmHg
DLCO unc % pred: 97 %
DLCO unc: 20.83 ml/min/mmHg
FEF 25-75 Post: 2.33 L/sec
FEF 25-75 Pre: 2.51 L/sec
FEF2575-%Change-Post: -6 %
FEF2575-%Pred-Post: 81 %
FEF2575-%Pred-Pre: 87 %
FEV1-%Change-Post: -3 %
FEV1-%Pred-Post: 82 %
FEV1-%Pred-Pre: 85 %
FEV1-Post: 2.37 L
FEV1-Pre: 2.45 L
FEV1FVC-%Change-Post: 4 %
FEV1FVC-%Pred-Pre: 101 %
FEV6-%Change-Post: -6 %
FEV6-%Pred-Post: 79 %
FEV6-%Pred-Pre: 84 %
FEV6-Post: 2.78 L
FEV6-Pre: 2.98 L
FEV6FVC-%Change-Post: 0 %
FEV6FVC-%Pred-Post: 102 %
FEV6FVC-%Pred-Pre: 101 %
FVC-%Change-Post: -7 %
FVC-%Pred-Post: 77 %
FVC-%Pred-Pre: 83 %
FVC-Post: 2.78 L
FVC-Pre: 3 L
Post FEV1/FVC ratio: 85 %
Post FEV6/FVC ratio: 100 %
Pre FEV1/FVC ratio: 82 %
Pre FEV6/FVC Ratio: 99 %
RV % pred: 90 %
RV: 1.57 L
TLC % pred: 94 %
TLC: 4.75 L

## 2022-08-07 NOTE — Telephone Encounter (Signed)
Received notice that patient needs Nucala refill sent to pt assistance program. It appears patient now has [place] Medicaid.  Refill sent to [facility] today via fax while we re-investigate Nucala coverage through her new insurance.  Submitted a Prior Authorization request to  [facility]  for NUCALA via CoverMyMeds. Will update once we receive a response.  Key: [id number]  Chesley Mires, PharmD, MPH, BCPS, CPP Clinical Pharmacist (Rheumatology and Pulmonology)

## 2022-08-07 NOTE — Progress Notes (Signed)
PFT Performed Today 

## 2022-08-07 NOTE — Telephone Encounter (Signed)
disregard

## 2022-08-07 NOTE — Patient Instructions (Signed)
PFT Performed Today

## 2022-08-07 NOTE — Patient Instructions (Signed)
-   can decrease to breztri 2 puffs once daily as long as asthma symptoms remain under control - albuterol as needed - rescue inhaler - see you in 3 months or sooner if need be!

## 2022-08-10 ENCOUNTER — Other Ambulatory Visit (HOSPITAL_COMMUNITY): Payer: Self-pay

## 2022-08-10 NOTE — Telephone Encounter (Signed)
Received notification from  [facility]  regarding a prior authorization for NUCALA. Authorization has been APPROVED from 08/07/22 to 08/07/23. Approval letter sent to scan center.  Per test claim, copay for 28 days supply is $4  Patient can fill through [facility]: [phone]   Authorization # [id number] Phone # [phone]  Chesley Mires, PharmD, MPH, BCPS, CPP Clinical Pharmacist (Rheumatology and Pulmonology)

## 2022-08-11 ENCOUNTER — Other Ambulatory Visit: Payer: Self-pay

## 2022-08-11 ENCOUNTER — Other Ambulatory Visit (HOSPITAL_COMMUNITY): Payer: Self-pay

## 2022-08-11 MED ORDER — NUCALA 100 MG/ML ~~LOC~~ SOAJ
100.0000 mg | SUBCUTANEOUS | 5 refills | Status: DC
  Filled 2022-08-11: qty 3, 84d supply, fill #0
  Filled 2022-08-11: qty 1, 28d supply, fill #0
  Filled 2022-10-30: qty 1, 28d supply, fill #1
  Filled 2022-11-30: qty 1, 28d supply, fill #2
  Filled 2022-12-28: qty 1, 28d supply, fill #3

## 2022-08-11 NOTE — Telephone Encounter (Signed)
Delivery instructions have been updated in [place], a 74-month supply of medication will be shipped to patient's home address on 08/14/22.  Rx has been processed in [facility] and there is a copay of $4.00.

## 2022-08-11 NOTE — Telephone Encounter (Signed)
Called patient regarding Nucala approval. Unable to reach. Left VM. MyChart message.  Rx for Nucala sent to [facility]. Routing to [place] [facility] for onboarding  Chesley Mires, PharmD, MPH, BCPS, CPP Clinical Pharmacist (Rheumatology and Pulmonology)

## 2022-08-16 DIAGNOSIS — Z419 Encounter for procedure for purposes other than remedying health state, unspecified: Secondary | ICD-10-CM | POA: Diagnosis not present

## 2022-09-16 DIAGNOSIS — Z419 Encounter for procedure for purposes other than remedying health state, unspecified: Secondary | ICD-10-CM | POA: Diagnosis not present

## 2022-10-16 DIAGNOSIS — Z419 Encounter for procedure for purposes other than remedying health state, unspecified: Secondary | ICD-10-CM | POA: Diagnosis not present

## 2022-10-27 ENCOUNTER — Other Ambulatory Visit (HOSPITAL_COMMUNITY): Payer: Self-pay

## 2022-10-30 ENCOUNTER — Other Ambulatory Visit (HOSPITAL_COMMUNITY): Payer: Self-pay

## 2022-11-06 ENCOUNTER — Other Ambulatory Visit (HOSPITAL_COMMUNITY): Payer: Self-pay

## 2022-11-16 DIAGNOSIS — Z419 Encounter for procedure for purposes other than remedying health state, unspecified: Secondary | ICD-10-CM | POA: Diagnosis not present

## 2022-11-28 ENCOUNTER — Encounter (HOSPITAL_COMMUNITY): Payer: Self-pay | Admitting: Emergency Medicine

## 2022-11-28 ENCOUNTER — Emergency Department (HOSPITAL_COMMUNITY): Payer: Medicaid Other

## 2022-11-28 ENCOUNTER — Emergency Department (HOSPITAL_COMMUNITY)
Admission: EM | Admit: 2022-11-28 | Discharge: 2022-11-29 | Disposition: A | Discharge status: Home / Self Care | Payer: Medicaid Other | Attending: Emergency Medicine | Admitting: Emergency Medicine

## 2022-11-28 ENCOUNTER — Other Ambulatory Visit: Payer: Self-pay

## 2022-11-28 DIAGNOSIS — J45909 Unspecified asthma, uncomplicated: Secondary | ICD-10-CM | POA: Diagnosis not present

## 2022-11-28 DIAGNOSIS — U071 COVID-19: Secondary | ICD-10-CM | POA: Insufficient documentation

## 2022-11-28 DIAGNOSIS — R0989 Other specified symptoms and signs involving the circulatory and respiratory systems: Secondary | ICD-10-CM | POA: Diagnosis not present

## 2022-11-28 DIAGNOSIS — R0602 Shortness of breath: Secondary | ICD-10-CM | POA: Diagnosis not present

## 2022-11-28 DIAGNOSIS — R509 Fever, unspecified: Secondary | ICD-10-CM | POA: Diagnosis present

## 2022-11-28 DIAGNOSIS — R110 Nausea: Secondary | ICD-10-CM | POA: Diagnosis not present

## 2022-11-28 DIAGNOSIS — R9431 Abnormal electrocardiogram [ECG] [EKG]: Secondary | ICD-10-CM | POA: Diagnosis not present

## 2022-11-28 LAB — CBC WITH DIFFERENTIAL/PLATELET
Abs Immature Granulocytes: 0.05 10*3/uL (ref 0.00–0.07)
Basophils Absolute: 0 10*3/uL (ref 0.0–0.1)
Basophils Relative: 0 %
Eosinophils Absolute: 0.1 10*3/uL (ref 0.0–0.5)
Eosinophils Relative: 1 %
HCT: 38.8 % (ref 36.0–46.0)
Hemoglobin: 12.7 g/dL (ref 12.0–15.0)
Immature Granulocytes: 1 %
Lymphocytes Relative: 11 %
Lymphs Abs: 1.2 10*3/uL (ref 0.7–4.0)
MCH: 28 pg (ref 26.0–34.0)
MCHC: 32.7 g/dL (ref 30.0–36.0)
MCV: 85.5 fL (ref 80.0–100.0)
Monocytes Absolute: 0.9 10*3/uL (ref 0.1–1.0)
Monocytes Relative: 8 %
Neutro Abs: 8.4 10*3/uL — ABNORMAL HIGH (ref 1.7–7.7)
Neutrophils Relative %: 79 %
Platelets: 329 10*3/uL (ref 150–400)
RBC: 4.54 MIL/uL (ref 3.87–5.11)
RDW: 14.9 % (ref 11.5–15.5)
WBC: 10.6 10*3/uL — ABNORMAL HIGH (ref 4.0–10.5)
nRBC: 0 % (ref 0.0–0.2)

## 2022-11-28 NOTE — ED Triage Notes (Addendum)
Patient c/o chills, right ear pain, nausea, body aches, sob, and "feeling like she's going to pass out" that started today.  Patient denies cough/fevers.

## 2022-11-29 ENCOUNTER — Other Ambulatory Visit (HOSPITAL_COMMUNITY): Payer: Self-pay

## 2022-11-29 ENCOUNTER — Other Ambulatory Visit: Payer: Self-pay

## 2022-11-29 LAB — TROPONIN I (HIGH SENSITIVITY): Troponin I (High Sensitivity): 3 ng/L (ref <18)

## 2022-11-29 MED ORDER — ONDANSETRON HCL 4 MG PO TABS
4.0000 mg | ORAL_TABLET | Freq: Four times a day (QID) | ORAL | 0 refills | Status: DC
  Filled 2022-11-29: qty 12, 3d supply, fill #0

## 2022-11-29 NOTE — Discharge Instructions (Signed)
Likely a viral infection, recommend over-the-counter pain medications like ibuprofen Tylenol for fever and pain control, nasal decongestions like Flonase and Zyrtec, Mucinex for cough.  If not eating recommend supplementing with Gatorade to help with electrolyte supplementation.  Follow-up PCP for further evaluation.  Come back to the emergency department if you develop chest pain, shortness of breath, severe abdominal pain, uncontrolled nausea, vomiting, diarrhea.  

## 2022-11-29 NOTE — ED Provider Notes (Signed)
[place] EMERGENCY DEPARTMENT AT [facility] Provider Note   CSN: [id number] Arrival date & time: 11/28/22  2303     History  No chief complaint on file.   Wendy Morgan is a 47 y.o. female.  HPI   Patient with medical history including asthma, presenting with complaints of URI-like symptoms.  States it started yesterday, states she has fevers chills, cough congestion, general body aches, nausea without any vomiting, decreased appetite, as well as having ear pain.  She denies any recent sick contacts no recent travels, she still passing gas having normal bowel movements.  She denies any chest pain shortness of breath no history of PEs or DVTs currently not on oral birth control no recent surgeries no long immobilizations.  Home Medications Prior to Admission medications   Medication Sig Start Date End Date Taking? Authorizing Provider  ondansetron (ZOFRAN) 4 MG tablet Take 1 tablet (4 mg total) by mouth every 6 (six) hours. 11/29/22  Yes Carroll Sage, PA-C  albuterol (PROVENTIL) (2.5 MG/3ML) 0.083% nebulizer solution Take 3 mLs (2.5 mg total) by nebulization every 4 (four) hours as needed for wheezing or shortness of breath. 09/19/21   Dione Booze, MD  albuterol (VENTOLIN HFA) 108 (90 Base) MCG/ACT inhaler Inhale 1-2 puffs into the lungs every 6 (six) hours as needed for wheezing or shortness of breath. 08/16/21   Claiborne Rigg, NP  Budeson-Glycopyrrol-Formoterol (BREZTRI AEROSPHERE) 160-9-4.8 MCG/ACT AERO Inhale 2 puffs into the lungs in the morning and at bedtime. 10/24/21   Omar Person, MD  ibuprofen (ADVIL) 800 MG tablet Take 800 mg by mouth every 8 (eight) hours as needed. 06/14/22   [provider]  meloxicam (MOBIC) 15 MG tablet Take 1 tablet (15 mg total) by mouth daily. 07/27/22   McElwee, Jake Church, NP  Mepolizumab (NUCALA) 100 MG/ML SOAJ Inject 1 mL (100 mg total) into the skin every 28 (twenty-eight) days. 08/11/22   Omar Person, MD   esomeprazole (NEXIUM) 40 MG capsule TAKE 1 CAPSULE BY MOUTH DAILY 08/17/21 08/18/21  Charlynne Pander, MD  sucralfate (CARAFATE) 1 g tablet TAKE 1 TABLET BY MOUTH 4 TIMES DAILY-WITH MEALS AND AT BEDTIME 08/17/21 08/18/21  Charlynne Pander, MD      Allergies    Patient has no known allergies.    Review of Systems   Review of Systems  Constitutional:  Positive for appetite change, chills and fever.  HENT:  Positive for ear pain.   Respiratory:  Negative for shortness of breath.   Cardiovascular:  Negative for chest pain.  Gastrointestinal:  Negative for abdominal pain.  Neurological:  Negative for headaches.    Physical Exam Updated Vital Signs BP 137/84 (BP Location: Right Arm)   Pulse 97   Temp 98.3 F (36.8 C) (Oral)   Resp 18   Wt 90.7 kg   SpO2 98%   BMI 34.33 kg/m  Physical Exam Vitals and nursing note reviewed.  Constitutional:      General: She is not in acute distress.    Appearance: She is not ill-appearing.  HENT:     Head: Normocephalic and atraumatic.     Right Ear: Tympanic membrane, ear canal and external ear normal.     Left Ear: Tympanic membrane, ear canal and external ear normal.     Ears:     Comments: No ear protrusion, no evidence of infection within the ear canal, both TMs were intact, no noted effusions, no erythema, slightly retracted on  my exam.    Nose: No congestion.  Eyes:     Conjunctiva/sclera: Conjunctivae normal.  Cardiovascular:     Rate and Rhythm: Normal rate and regular rhythm.     Pulses: Normal pulses.     Heart sounds: No murmur heard.    No friction rub. No gallop.  Pulmonary:     Effort: No respiratory distress.     Breath sounds: No wheezing, rhonchi or rales.  Musculoskeletal:     Right lower leg: No edema.     Left lower leg: No edema.     Comments: No unilateral leg swelling no calf tenderness no palpable cords.  Skin:    General: Skin is warm and dry.  Neurological:     Mental Status: She is alert.  Psychiatric:         Mood and Affect: Mood normal.     ED Results / Procedures / Treatments   Labs (all labs ordered are listed, but only abnormal results are displayed) Labs Reviewed  RESP PANEL BY RT-PCR (RSV, FLU A&B, COVID)  RVPGX2 - Abnormal; Notable for the following components:      Result Value   SARS Coronavirus 2 by RT PCR POSITIVE (*)    All other components within normal limits  CBC WITH DIFFERENTIAL/PLATELET - Abnormal; Notable for the following components:   WBC 10.6 (*)    Neutro Abs 8.4 (*)    All other components within normal limits  COMPREHENSIVE METABOLIC PANEL - Abnormal; Notable for the following components:   Sodium 133 (*)    CO2 21 (*)    Calcium 8.5 (*)    Albumin 3.3 (*)    All other components within normal limits  TROPONIN I (HIGH SENSITIVITY)  TROPONIN I (HIGH SENSITIVITY)    EKG EKG Interpretation Date/Time:  Tuesday November 28 2022 23:01:11 EDT Ventricular Rate:  94 PR Interval:  172 QRS Duration:  86 QT Interval:  354 QTC Calculation: 442 R Axis:   25  Text Interpretation: Normal sinus rhythm Possible Left atrial enlargement Borderline ECG When compared with ECG of 20-Oct-2021 23:12,  rate is faster Confirmed by Gilda Crease [phone]) on 11/29/2022 4:56:56 AM  Radiology DG Chest 2 View  Result Date: 11/28/2022 CLINICAL DATA:  Shortness of breath. Ear pain, nausea, congestion, and weakness starting today. EXAM: CHEST - 2 VIEW COMPARISON:  10/20/2021 FINDINGS: The heart size and mediastinal contours are within normal limits. Both lungs are clear. The visualized skeletal structures are unremarkable. IMPRESSION: No active cardiopulmonary disease. Electronically Signed   By: Burman Nieves M.D.   On: 11/28/2022 23:50    Procedures Procedures    Medications Ordered in ED Medications - No data to display  ED Course/ Medical Decision Making/ A&P                                 Medical Decision Making Amount and/or Complexity of Data  Reviewed Labs: ordered. Radiology: ordered.  Risk Prescription drug management.   This patient presents to the ED for concern of URI, this involves an extensive number of treatment options, and is a complaint that carries with it a high risk of complications and morbidity.  The differential diagnosis includes PE, ACS, pneumonia,    Additional history obtained:  Additional history obtained from N/A External records from outside source obtained and reviewed including pulmonology notes   Co morbidities that complicate the patient evaluation  Asthma  Social Determinants of Health:  N/A    Lab Tests:  I Ordered, and personally interpreted labs.  The pertinent results include: CBC shows slight leukocytosis of 10.6, CMP reveals sodium 133 CO2 21 calcium 8.5 albumin 3.3 negative delta troponin respiratory panel positive for COVID   Imaging Studies ordered:  I ordered imaging studies including chest x-ray I independently visualized and interpreted imaging which showed negative acute findings I agree with the radiologist interpretation   Cardiac Monitoring:  The patient was maintained on a cardiac monitor.  I personally viewed and interpreted the cardiac monitored which showed an underlying rhythm of: Without signs of ischemia   Medicines ordered and prescription drug management:  I ordered medication including N/A I have reviewed the patients home medicines and have made adjustments as needed  Critical Interventions:  N/A   Reevaluation:  Presents with URI-like symptoms, triage obtain basic lab workup imaging which I personally viewed unremarkable, she had a benign physical exam, agreement discharge at this time.  Consultations Obtained:  N/a    Test Considered:  N/a    Rule out Low suspicion for systemic infection as patient is nontoxic-appearing, vital signs reassuring, no obvious source infection noted on exam.  Low suspicion for pneumonia as lung  sounds are clear bilaterally, x-ray did not reveal any acute findings.  I have low suspicion for PE as patient denies pleuritic chest pain, shortness of breath, patient is PERC.  Doubt ACS presentation atypical EKG without signs of ischemia negative delta troponins..  Low suspicion patient would need  hospitalized due to viral infection or Covid as vital signs reassuring, patient is not in respiratory distress.      Dispostion and problem list  After consideration of the diagnostic results and the patients response to treatment, I feel that the patent would benefit from discharge.    COVID-recommend symptom management, follow-up PCP as needed, will defer on antiviral treatment she has low risk factors for adverse outcome.            Final Clinical Impression(s) / ED Diagnoses Final diagnoses:  COVID    Rx / DC Orders ED Discharge Orders          Ordered    ondansetron (ZOFRAN) 4 MG tablet  Every 6 hours        11/29/22 0502              Carroll Sage, PA-C 11/29/22 0503    Gilda Crease, MD 12/01/22 0700

## 2022-11-30 ENCOUNTER — Telehealth: Payer: Medicaid Other | Admitting: Nurse Practitioner

## 2022-11-30 ENCOUNTER — Telehealth: Payer: Self-pay

## 2022-11-30 ENCOUNTER — Other Ambulatory Visit (HOSPITAL_COMMUNITY): Payer: Self-pay

## 2022-11-30 ENCOUNTER — Telehealth: Payer: Self-pay | Admitting: Nurse Practitioner

## 2022-11-30 NOTE — Telephone Encounter (Signed)
11/30/22 - No show letter sent.

## 2022-11-30 NOTE — Transitions of Care (Post Inpatient/ED Visit) (Signed)
@  [email or web address]@  11/30/2022  Name: Wendy Morgan MRN: [id number] DOB: December 18, 1975  Today's TOC FU Call Status: Today's TOC FU Call Status:: Unsuccessful Call (1st Attempt) Unsuccessful Call (1st Attempt) Date: 11/30/22  Attempted to reach the patient regarding the most recent Inpatient/ED visit.  Follow Up Plan: Additional outreach attempts will be made to reach the patient to complete the Transitions of Care (Post Inpatient/ED visit) call.   Signature: Clois Comber

## 2022-11-30 NOTE — Telephone Encounter (Signed)
I called patient at 8am and 8:09am to get checked in for her 8am appointment with Lauren. I left message for patient on her voicemail to return call. Lauren aware.

## 2022-12-01 ENCOUNTER — Telehealth: Payer: Self-pay

## 2022-12-01 ENCOUNTER — Encounter: Payer: Self-pay | Admitting: Nurse Practitioner

## 2022-12-01 NOTE — Telephone Encounter (Signed)
Noted  

## 2022-12-01 NOTE — Transitions of Care (Post Inpatient/ED Visit) (Signed)
   12/01/2022  Name: Wendy Morgan MRN: [id number] DOB: 02-Feb-1976  Today's TOC FU Call Status: Today's TOC FU Call Status:: Successful TOC FU Call Completed TOC FU Call Complete Date: 12/01/22  Transition Care Management Follow-up Telephone Call Date of Discharge: 11/29/22 Discharge Facility: [facility]) Type of Discharge: Emergency Department Reason for ED Visit: Other: (COVID) How have you been since you were released from the hospital?: Better Any questions or concerns?: No  Items Reviewed: Did you receive and understand the discharge instructions provided?: Yes Medications obtained,verified, and reconciled?: No Any new allergies since your discharge?: No Dietary orders reviewed?: NA Do you have support at home?: Yes  Medications Reviewed Today: Medications Reviewed Today   Medications were not reviewed in this encounter     Home Care and Equipment/Supplies: Were Home Health Services Ordered?: NA Any new equipment or medical supplies ordered?: NA  Functional Questionnaire: Do you need assistance with bathing/showering or dressing?: No Do you need assistance with meal preparation?: No Do you need assistance with eating?: No Do you have difficulty maintaining continence: No Do you need assistance with getting out of bed/getting out of a chair/moving?: No Do you have difficulty managing or taking your medications?: No  Follow up appointments reviewed: PCP Follow-up appointment confirmed?: NA Specialist Hospital Follow-up appointment confirmed?: NA Do you need transportation to your follow-up appointment?: No Do you understand care options if your condition(s) worsen?: Yes-patient verbalized understanding    SIGNATURE Arvil Persons, BSN, RN  .

## 2022-12-01 NOTE — Telephone Encounter (Signed)
 2nd no show, final warning sent via mail and mychart

## 2022-12-05 ENCOUNTER — Ambulatory Visit (INDEPENDENT_AMBULATORY_CARE_PROVIDER_SITE_OTHER): Payer: Medicaid Other | Admitting: Family Medicine

## 2022-12-05 ENCOUNTER — Other Ambulatory Visit: Payer: Self-pay

## 2022-12-05 ENCOUNTER — Encounter: Payer: Self-pay | Admitting: Family Medicine

## 2022-12-05 ENCOUNTER — Other Ambulatory Visit (HOSPITAL_COMMUNITY): Payer: Self-pay

## 2022-12-05 VITALS — BP 124/82 | HR 86 | Temp 98.1°F | Wt 200.0 lb

## 2022-12-05 DIAGNOSIS — U071 COVID-19: Secondary | ICD-10-CM | POA: Insufficient documentation

## 2022-12-05 DIAGNOSIS — J4541 Moderate persistent asthma with (acute) exacerbation: Secondary | ICD-10-CM | POA: Diagnosis not present

## 2022-12-05 MED ORDER — PREDNISONE 50 MG PO TABS
50.0000 mg | ORAL_TABLET | Freq: Every day | ORAL | 0 refills | Status: AC
  Filled 2022-12-05: qty 5, 5d supply, fill #0

## 2022-12-05 MED ORDER — PROMETHAZINE-DM 6.25-15 MG/5ML PO SYRP
5.0000 mL | ORAL_SOLUTION | Freq: Four times a day (QID) | ORAL | 0 refills | Status: DC | PRN
  Filled 2022-12-05: qty 118, 6d supply, fill #0

## 2022-12-05 NOTE — Progress Notes (Signed)
Assessment/Plan:   Problem List Items Addressed This Visit       Respiratory   Asthma, chronic, moderate persistent, with acute exacerbation   Relevant Medications   predniSONE (DELTASONE) 50 MG tablet   promethazine-dextromethorphan (PROMETHAZINE-DM) 6.25-15 MG/5ML syrup     Other   COVID-19 - Primary    The patient has a lingering cough post-COVID-19 diagnosis, compounded by a history of asthma. Current symptoms are suggestive of an asthma exacerbation possibly triggered by COVID-19.  Plan:  Prescribe prednisone 50 mg for 5 days to manage the asthma exacerbation. Initiate promethazine-dextromethorphan cough syrup, 5 ml every 6 hours as needed for the cough. Continue regular use of albuterol inhaler and Breztri. Advise the patient to monitor symptoms and return if there is no improvement in a few days. Instruct the patient to seek emergency care if symptoms worsen significantly.      Relevant Medications   predniSONE (DELTASONE) 50 MG tablet   promethazine-dextromethorphan (PROMETHAZINE-DM) 6.25-15 MG/5ML syrup    Medications Discontinued During This Encounter  Medication Reason   ibuprofen (ADVIL) 800 MG tablet    meloxicam (MOBIC) 15 MG tablet    ondansetron (ZOFRAN) 4 MG tablet     Return if symptoms worsen or fail to improve.    Subjective:   Encounter date: 12/05/2022  Wendy Morgan is a 47 y.o. female who has Allergic rhinitis with postnasal drip; Acute recurrent pansinusitis; Dental caries; Acute recurrent maxillary sinusitis; Asthma, chronic, moderate persistent, with acute exacerbation; Precordial chest pain; Left groin pain; Xanthelasma of eyelid, bilateral; Moderate persistent asthma without complication; Morbid obesity (HCC); and COVID-19 on their problem list..   She  has a past medical history of Asthma, Bronchiectasis with acute exacerbation (HCC), and Class 1 obesity (04/11/2021)..  Chief Complaint: Persistent cough following a recent COVID-19  infection.  History of Present Illness:  COVID-19 Infection: The patient tested positive for COVID-19 on November 28, 2022. She continues to experience a lingering dry cough a week post-diagnosis, comparable to her past asthma exacerbations. The cough necessitates frequent breath-catching but the patient reports she is able to breathe despite the discomfort. The cough is not relieved by cough drops or over-the-counter cough medicines.  Asthma: The patient has a significant history of asthma and is currently managing it with albuterol (used twice daily) and Breztri (two puffs in the morning and two at night). Despite regular use of these medications, she has felt the need to use the albuterol inhaler additionally to try and alleviate her cough, albeit with little effect.  Review of Systems  Constitutional:  Positive for malaise/fatigue. Negative for chills and fever.  HENT:  Positive for congestion. Negative for sore throat.   Respiratory:  Positive for cough and shortness of breath. Negative for wheezing.   Cardiovascular:  Negative for chest pain.    Past Surgical History:  Procedure Laterality Date   TUBAL LIGATION      Outpatient Medications Prior to Visit  Medication Sig Dispense Refill   albuterol (PROVENTIL) (2.5 MG/3ML) 0.083% nebulizer solution Take 3 mLs (2.5 mg total) by nebulization every 4 (four) hours as needed for wheezing or shortness of breath. 90 mL 0   albuterol (VENTOLIN HFA) 108 (90 Base) MCG/ACT inhaler Inhale 1-2 puffs into the lungs every 6 (six) hours as needed for wheezing or shortness of breath. 18 g 1   Budeson-Glycopyrrol-Formoterol (BREZTRI AEROSPHERE) 160-9-4.8 MCG/ACT AERO Inhale 2 puffs into the lungs in the morning and at bedtime. 5.9 g 0   Mepolizumab (NUCALA)  100 MG/ML SOAJ Inject 1 mL (100 mg total) into the skin every 28 (twenty-eight) days. 1 mL 5   ibuprofen (ADVIL) 800 MG tablet Take 800 mg by mouth every 8 (eight) hours as needed. (Patient not  taking: Reported on 12/05/2022)     meloxicam (MOBIC) 15 MG tablet Take 1 tablet (15 mg total) by mouth daily. (Patient not taking: Reported on 12/05/2022) 30 tablet 0   ondansetron (ZOFRAN) 4 MG tablet Take 1 tablet (4 mg total) by mouth every 6 (six) hours. (Patient not taking: Reported on 12/05/2022) 12 tablet 0   No facility-administered medications prior to visit.    Family History  Problem Relation Age of Onset   Healthy Mother    Healthy Father    Kidney disease Neg Hx    Anxiety disorder Neg Hx    Depression Neg Hx     Social History   Socioeconomic History   Marital status: Single    Spouse name: Not on file   Number of children: 4   Years of education: Not on file   Highest education level: Not on file  Occupational History   Not on file  Tobacco Use   Smoking status: Former    Current packs/day: 0.00    Average packs/day: 0.3 packs/day for 6.0 years (1.5 ttl pk-yrs)    Types: Cigarettes    Start date: 66    Quit date: 53    Years since quitting: 26.6    Passive exposure: Never   Smokeless tobacco: Never  Vaping Use   Vaping status: Never Used  Substance and Sexual Activity   Alcohol use: No   Drug use: No   Sexual activity: Yes    Birth control/protection: Surgical  Other Topics Concern   Not on file  Social History Narrative   Not on file   Social Determinants of Health   Financial Resource Strain: Not on file  Food Insecurity: No Food Insecurity (07/21/2022)   Hunger Vital Sign    Worried About Running Out of Food in the Last Year: Never true    Ran Out of Food in the Last Year: Never true  Transportation Needs: No Transportation Needs (07/21/2022)   PRAPARE - Administrator, Civil Service (Medical): No    Lack of Transportation (Non-Medical): No  Physical Activity: Not on file  Stress: Not on file  Social Connections: Not on file  Intimate Partner Violence: Not At Risk (07/21/2022)   Humiliation, Afraid, Rape, and Kick questionnaire     Fear of Current or Ex-Partner: No    Emotionally Abused: No    Physically Abused: No    Sexually Abused: No                                                                                                  Objective:  Physical Exam: BP 124/82 (BP Location: Left Arm, Patient Position: Sitting, Cuff Size: Large)   Pulse 86   Temp 98.1 F (36.7 C) (Oral)   Wt 200 lb (90.7 kg)   SpO2 99%   BMI 34.33 kg/m  Physical Exam Constitutional:      General: She is not in acute distress.    Appearance: Normal appearance. She is not ill-appearing or toxic-appearing.  HENT:     Head: Normocephalic and atraumatic.     Nose: Nose normal. No congestion.  Eyes:     General: No scleral icterus.    Extraocular Movements: Extraocular movements intact.  Cardiovascular:     Rate and Rhythm: Normal rate and regular rhythm.     Pulses: Normal pulses.     Heart sounds: Normal heart sounds.  Pulmonary:     Effort: Pulmonary effort is normal. No respiratory distress.     Breath sounds: Decreased breath sounds (left side) present. No wheezing.  Abdominal:     General: Abdomen is flat. Bowel sounds are normal.     Palpations: Abdomen is soft.  Musculoskeletal:        General: Normal range of motion.  Lymphadenopathy:     Cervical: No cervical adenopathy.  Skin:    General: Skin is warm and dry.     Findings: No rash.  Neurological:     General: No focal deficit present.     Mental Status: She is alert and oriented to person, place, and time. Mental status is at baseline.  Psychiatric:        Mood and Affect: Mood normal.        Behavior: Behavior normal.        Thought Content: Thought content normal.        Judgment: Judgment normal.     DG Chest 2 View  Result Date: 11/28/2022 CLINICAL DATA:  Shortness of breath. Ear pain, nausea, congestion, and weakness starting today. EXAM: CHEST - 2 VIEW COMPARISON:  10/20/2021 FINDINGS: The heart size and mediastinal contours are within  normal limits. Both lungs are clear. The visualized skeletal structures are unremarkable. IMPRESSION: No active cardiopulmonary disease. Electronically Signed   By: Burman Nieves M.D.   On: 11/28/2022 23:50    Recent Results (from the past 2160 hour(s))  Resp panel by RT-PCR (RSV, Flu A&B, Covid) Anterior Nasal Swab     Status: Abnormal   Collection Time: 11/28/22 11:13 PM   Specimen: Anterior Nasal Swab  Result Value Ref Range   SARS Coronavirus 2 by RT PCR POSITIVE (A) NEGATIVE   Influenza A by PCR NEGATIVE NEGATIVE   Influenza B by PCR NEGATIVE NEGATIVE    Comment: (NOTE) The Xpert Xpress SARS-CoV-2/FLU/RSV plus assay is intended as an aid in the diagnosis of influenza from Nasopharyngeal swab specimens and should not be used as a sole basis for treatment. Nasal washings and aspirates are unacceptable for Xpert Xpress SARS-CoV-2/FLU/RSV testing.  Fact Sheet for Patients: BloggerCourse.com  Fact Sheet for Healthcare Providers: SeriousBroker.it  This test is not yet approved or cleared by the [place] FDA and has been authorized for detection and/or diagnosis of SARS-CoV-2 by FDA under an Emergency Use Authorization (EUA). This EUA will remain in effect (meaning this test can be used) for the duration of the COVID-19 declaration under Section 564(b)(1) of the Act, 21 U.S.C. section 360bbb-3(b)(1), unless the authorization is terminated or revoked.     Resp Syncytial Virus by PCR NEGATIVE NEGATIVE    Comment: (NOTE) Fact Sheet for Patients: BloggerCourse.com  Fact Sheet for Healthcare Providers: SeriousBroker.it  This test is not yet approved or cleared by the [place] FDA and has been authorized for detection and/or diagnosis of SARS-CoV-2 by FDA under an Emergency Use Authorization (EUA).  This EUA will remain in effect (meaning this test can be used) for the  duration of the COVID-19 declaration under Section 564(b)(1) of the Act, 21 U.S.C. section 360bbb-3(b)(1), unless the authorization is terminated or revoked.  Performed at [facility], [address], [place], [place] [address]   CBC with Differential     Status: Abnormal   Collection Time: 11/28/22 11:18 PM  Result Value Ref Range   WBC 10.6 (H) 4.0 - 10.5 K/uL   RBC 4.54 3.87 - 5.11 MIL/uL   Hemoglobin 12.7 12.0 - 15.0 g/dL   HCT 13.0 86.5 - 78.4 %   MCV 85.5 80.0 - 100.0 fL   MCH 28.0 26.0 - 34.0 pg   MCHC 32.7 30.0 - 36.0 g/dL   RDW 69.6 29.5 - 28.4 %   Platelets 329 150 - 400 K/uL   nRBC 0.0 0.0 - 0.2 %   Neutrophils Relative % 79 %   Neutro Abs 8.4 (H) 1.7 - 7.7 K/uL   Lymphocytes Relative 11 %   Lymphs Abs 1.2 0.7 - 4.0 K/uL   Monocytes Relative 8 %   Monocytes Absolute 0.9 0.1 - 1.0 K/uL   Eosinophils Relative 1 %   Eosinophils Absolute 0.1 0.0 - 0.5 K/uL   Basophils Relative 0 %   Basophils Absolute 0.0 0.0 - 0.1 K/uL   Immature Granulocytes 1 %   Abs Immature Granulocytes 0.05 0.00 - 0.07 K/uL    Comment: Performed at [facility], [address] [place], [place] [address]  Comprehensive metabolic panel     Status: Abnormal   Collection Time: 11/28/22 11:18 PM  Result Value Ref Range   Sodium 133 (L) 135 - 145 mmol/L   Potassium 3.5 3.5 - 5.1 mmol/L   Chloride 104 98 - 111 mmol/L   CO2 21 (L) 22 - 32 mmol/L   Glucose, Bld 96 70 - 99 mg/dL    Comment: Glucose reference range applies only to samples taken after fasting for at least 8 hours.   BUN 11 6 - 20 mg/dL   Creatinine, Ser 0.10 0.44 - 1.00 mg/dL   Calcium 8.5 (L) 8.9 - 10.3 mg/dL   Total Protein 6.8 6.5 - 8.1 g/dL   Albumin 3.3 (L) 3.5 - 5.0 g/dL   AST 19 15 - 41 U/L   ALT 19 0 - 44 U/L   Alkaline Phosphatase 61 38 - 126 U/L   Total Bilirubin 0.5 0.3 - 1.2 mg/dL   GFR, Estimated >27 >25 mL/min    Comment: (NOTE) Calculated using the CKD-EPI Creatinine Equation (2021)    Anion gap 8  5 - 15    Comment: Performed at [facility], [address] [place], [place] [address]  Troponin I (High Sensitivity)     Status: None   Collection Time: 11/28/22 11:18 PM  Result Value Ref Range   Troponin I (High Sensitivity) 3 <18 ng/L    Comment: (NOTE) Elevated high sensitivity troponin I (hsTnI) values and significant  changes across serial measurements may suggest ACS but many other  chronic and acute conditions are known to elevate hsTnI results.  Refer to the "Links" section for chest pain algorithms and additional  guidance. Performed at [facility], [address] [place], [place] [address]   Troponin I (High Sensitivity)     Status: None   Collection Time: 11/29/22  1:19 AM  Result Value Ref Range   Troponin I (High Sensitivity) 3 <  18 ng/L    Comment: (NOTE) Elevated high sensitivity troponin I (hsTnI) values and significant  changes across serial measurements may suggest ACS but many other  chronic and acute conditions are known to elevate hsTnI results.  Refer to the "Links" section for chest pain algorithms and additional  guidance. Performed at [facility], [address] [place], [place] [address]         Garner Nash, MD, MS

## 2022-12-05 NOTE — Assessment & Plan Note (Signed)
The patient has a lingering cough post-COVID-19 diagnosis, compounded by a history of asthma. Current symptoms are suggestive of an asthma exacerbation possibly triggered by COVID-19.  Plan:  Prescribe prednisone 50 mg for 5 days to manage the asthma exacerbation. Initiate promethazine-dextromethorphan cough syrup, 5 ml every 6 hours as needed for the cough. Continue regular use of albuterol inhaler and Breztri. Advise the patient to monitor symptoms and return if there is no improvement in a few days. Instruct the patient to seek emergency care if symptoms worsen significantly.

## 2022-12-06 ENCOUNTER — Ambulatory Visit: Payer: Medicaid Other | Admitting: Nurse Practitioner

## 2022-12-06 DIAGNOSIS — F330 Major depressive disorder, recurrent, mild: Secondary | ICD-10-CM | POA: Diagnosis not present

## 2022-12-17 DIAGNOSIS — Z419 Encounter for procedure for purposes other than remedying health state, unspecified: Secondary | ICD-10-CM | POA: Diagnosis not present

## 2022-12-28 ENCOUNTER — Other Ambulatory Visit (HOSPITAL_COMMUNITY): Payer: Self-pay

## 2023-01-08 ENCOUNTER — Encounter: Payer: Self-pay | Admitting: Nurse Practitioner

## 2023-01-10 ENCOUNTER — Encounter: Payer: Self-pay | Admitting: Nurse Practitioner

## 2023-01-10 ENCOUNTER — Telehealth: Payer: Medicaid Other | Admitting: Nurse Practitioner

## 2023-01-10 VITALS — Ht 64.0 in

## 2023-01-10 DIAGNOSIS — R1032 Left lower quadrant pain: Secondary | ICD-10-CM | POA: Diagnosis not present

## 2023-01-10 MED ORDER — GABAPENTIN 300 MG PO CAPS: 300.0000 mg | ORAL_CAPSULE | Freq: Two times a day (BID) | ORAL | 1 refills | Status: DC

## 2023-01-10 NOTE — Progress Notes (Signed)
[facility] [address] [place] [address] Dept: [phone] Dept Fax: [phone]  Virtual Video Visit  I connected with Wendy Morgan on 01/10/23 at 11:00 AM EDT by a video enabled telemedicine application and verified that I am speaking with the correct person using two identifiers.  Location patient: Home Location provider: Clinic Persons participating in the virtual visit: Patient; Rodman Pickle, NP; Malena Peer, CMA  I discussed the limitations of evaluation and management by telemedicine and the availability of in person appointments. The patient expressed understanding and agreed to proceed.  Chief Complaint  Patient presents with   Leg Pain    Left leg pain that radiates down leg    SUBJECTIVE:  HPI: Wendy Morgan is a 47 y.o. female who presents with left leg pain that radiates down her leg.   She had this pain that started last month. She was checked for DVT and x-ray that was negative. She saw orthopedics and received an injection in her groin for pinched nerve. She states that this helped for a while. She states that standing and laying on her side makes the pain worse. She tried ibuprofen which helps for a short while and then the pain returns.   Patient Active Problem List   Diagnosis Date Noted   COVID-19 12/05/2022   Left groin pain 04/11/2022   Xanthelasma of eyelid, bilateral 04/11/2022   Moderate persistent asthma without complication 04/11/2022   Morbid obesity (HCC) 04/11/2022   Precordial chest pain 12/19/2021   Asthma, chronic, moderate persistent, with acute exacerbation 05/04/2021   Acute recurrent maxillary sinusitis 07/25/2019   Allergic rhinitis with postnasal drip 07/22/2019   Acute recurrent pansinusitis 07/22/2019   Dental caries 07/22/2019    Past Surgical History:  Procedure Laterality Date   TUBAL LIGATION      Family History  Problem Relation Age of Onset    Healthy Mother    Healthy Father    Kidney disease Neg Hx    Anxiety disorder Neg Hx    Depression Neg Hx     Social History   Tobacco Use   Smoking status: Former    Current packs/day: 0.00    Average packs/day: 0.3 packs/day for 6.0 years (1.5 ttl pk-yrs)    Types: Cigarettes    Start date: 62    Quit date: 1998    Years since quitting: 26.7    Passive exposure: Never   Smokeless tobacco: Never  Vaping Use   Vaping status: Never Used  Substance Use Topics   Alcohol use: No   Drug use: No     Current Outpatient Medications:    albuterol (PROVENTIL) (2.5 MG/3ML) 0.083% nebulizer solution, Take 3 mLs (2.5 mg total) by nebulization every 4 (four) hours as needed for wheezing or shortness of breath., Disp: 90 mL, Rfl: 0   albuterol (VENTOLIN HFA) 108 (90 Base) MCG/ACT inhaler, Inhale 1-2 puffs into the lungs every 6 (six) hours as needed for wheezing or shortness of breath., Disp: 18 g, Rfl: 1   Budeson-Glycopyrrol-Formoterol (BREZTRI AEROSPHERE) 160-9-4.8 MCG/ACT AERO, Inhale 2 puffs into the lungs in the morning and at bedtime., Disp: 5.9 g, Rfl: 0   gabapentin (NEURONTIN) 300 MG capsule, Take 1 capsule (300 mg total) by mouth 2 (two) times daily. Start 1 capsule at bedtime for 7 days, then increase to twice a day, Disp: 60 capsule, Rfl: 1   Mepolizumab (NUCALA) 100 MG/ML SOAJ, Inject 1 mL (100 mg total)  into the skin every 28 (twenty-eight) days., Disp: 1 mL, Rfl: 5  No Known Allergies  ROS: See pertinent positives and negatives per HPI.  OBSERVATIONS/OBJECTIVE:  VITALS per patient if applicable: Today's Vitals   01/10/23 1102  Height: 5\' 4"  (1.626 m)  PainSc: 10-Worst pain ever  PainLoc: Leg   Body mass index is 34.33 kg/m.    GENERAL: Alert and oriented. Appears well and in no acute distress.  HEENT: Atraumatic. Conjunctiva clear. No obvious abnormalities on inspection of external nose and ears.  NECK: Normal movements of the head and neck.  LUNGS: On  inspection, no signs of respiratory distress. Breathing rate appears normal. No obvious gross SOB, gasping or wheezing, and no conversational dyspnea.  CV: No obvious cyanosis.  MS: Moves all visible extremities without noticeable abnormality.  PSYCH/NEURO: Pleasant and cooperative. No obvious depression or anxiety. Speech and thought processing grossly intact.  ASSESSMENT AND PLAN:  Problem List Items Addressed This Visit       Other   Left groin pain - Primary    Chronic, not controlled.  She saw the orthopedic who treated her with a steroid injection for radiculopathy.  She states that it helped for about a month and the pain returned.  She states that the pain is worse with laying down and standing.  Will have her start gabapentin 300 mg at bedtime for 7 days and increase to twice a day if needed.  Encouraged to reach back out to the orthopedic to let them know that the pain has returned, but the injection did help for about a month.  Follow-up in 4 weeks or sooner with concerns.        I discussed the assessment and treatment plan with the patient. The patient was provided an opportunity to ask questions and all were answered. The patient agreed with the plan and demonstrated an understanding of the instructions.   The patient was advised to call back or seek an in-person evaluation if the symptoms worsen or if the condition fails to improve as anticipated.   Gerre Scull, NP

## 2023-01-10 NOTE — Progress Notes (Signed)
Called pt she will call back when she has her schedule.

## 2023-01-10 NOTE — Assessment & Plan Note (Signed)
Chronic, not controlled.  She saw the orthopedic who treated her with a steroid injection for radiculopathy.  She states that it helped for about a month and the pain returned.  She states that the pain is worse with laying down and standing.  Will have her start gabapentin 300 mg at bedtime for 7 days and increase to twice a day if needed.  Encouraged to reach back out to the orthopedic to let them know that the pain has returned, but the injection did help for about a month.  Follow-up in 4 weeks or sooner with concerns.

## 2023-01-10 NOTE — Patient Instructions (Signed)
It was great to see you!  Start gabapentin at bedtime for 7 days, then increase to 1 capsule twice a day if needed  Reach out to the orthopedic and let them know that the pain is back and the injection was helpful.   Let's follow-up in 4 weeks, sooner if you have concerns.  If a referral was placed today, you will be contacted for an appointment. Please note that routine referrals can sometimes take up to 3-4 weeks to process. Please call our office if you haven't heard anything after this time frame.  Take care,  Rodman Pickle, NP

## 2023-01-16 DIAGNOSIS — Z419 Encounter for procedure for purposes other than remedying health state, unspecified: Secondary | ICD-10-CM | POA: Diagnosis not present

## 2023-01-18 DIAGNOSIS — F330 Major depressive disorder, recurrent, mild: Secondary | ICD-10-CM | POA: Diagnosis not present

## 2023-01-24 ENCOUNTER — Other Ambulatory Visit: Payer: Self-pay

## 2023-01-24 ENCOUNTER — Encounter: Payer: Self-pay | Admitting: Sports Medicine

## 2023-01-25 ENCOUNTER — Other Ambulatory Visit (HOSPITAL_COMMUNITY): Payer: Self-pay

## 2023-01-26 ENCOUNTER — Other Ambulatory Visit (HOSPITAL_COMMUNITY): Payer: Self-pay

## 2023-01-29 ENCOUNTER — Other Ambulatory Visit: Payer: Self-pay

## 2023-01-30 ENCOUNTER — Other Ambulatory Visit (HOSPITAL_COMMUNITY): Payer: Self-pay

## 2023-01-30 ENCOUNTER — Ambulatory Visit: Payer: Medicaid Other | Admitting: Sports Medicine

## 2023-01-30 NOTE — Progress Notes (Signed)
Specialty Pharmacy Refill Coordination Note  Wendy Morgan is a 47 y.o. female contacted today regarding refills of specialty medication(s) Mepolizumab   Patient requested [name] at [facility] at [place] date: 02/12/23   Medication will be filled on 02/12/23.

## 2023-01-30 NOTE — Progress Notes (Signed)
Specialty Pharmacy Ongoing Clinical Assessment Note  Wendy Morgan is a 47 y.o. female who is being followed by the specialty pharmacy service for RxSp Asthma/COPD   Patient's specialty medication(s) reviewed today: Mepolizumab   Missed doses in the last 4 weeks: 0   Patient/Caregiver did not have any additional questions or concerns.   Therapeutic benefit summary: Patient is achieving benefit   Adverse events/side effects summary: Experienced adverse events/side effects (small itchy bumps pop-up occasionally, tolerable)   Patient's therapy is appropriate to: Continue    Goals Addressed             This Visit's Progress    Minimize recurrence of flares       Patient is on track. Patient will maintain adherence.  Wendy Morgan reports that she is well-controlled at this time.          Follow up:  6 months  Servando Snare Specialty Pharmacist

## 2023-01-31 ENCOUNTER — Other Ambulatory Visit (HOSPITAL_COMMUNITY): Payer: Self-pay

## 2023-01-31 ENCOUNTER — Telehealth: Payer: Self-pay | Admitting: Nurse Practitioner

## 2023-01-31 ENCOUNTER — Other Ambulatory Visit: Payer: Self-pay

## 2023-01-31 DIAGNOSIS — J4550 Severe persistent asthma, uncomplicated: Secondary | ICD-10-CM

## 2023-01-31 MED ORDER — NUCALA 100 MG/ML ~~LOC~~ SOAJ
100.0000 mg | SUBCUTANEOUS | 1 refills | Status: DC
  Filled 2023-01-31 – 2023-02-22 (×2): qty 1, 28d supply, fill #0
  Filled 2023-03-16 – 2023-04-30 (×4): qty 1, 28d supply, fill #1

## 2023-01-31 NOTE — Telephone Encounter (Signed)
Rx for Nucala sent to [facility] for 2 months only. Further refills to be sent after OV on 03/28/2023  Chesley Mires, PharmD, MPH, BCPS, CPP Clinical Pharmacist (Rheumatology and Pulmonology)

## 2023-01-31 NOTE — Telephone Encounter (Signed)
Patient states needs refill for Nucala. Patient scheduled 03/28/2023 with Rhunette Croft NP. Patient phone number is [phone].

## 2023-02-04 ENCOUNTER — Emergency Department (HOSPITAL_COMMUNITY)
Admission: EM | Admit: 2023-02-04 | Discharge: 2023-02-04 | Disposition: A | Discharge status: Home / Self Care | Payer: Medicaid Other | Attending: Emergency Medicine | Admitting: Emergency Medicine

## 2023-02-04 DIAGNOSIS — J45909 Unspecified asthma, uncomplicated: Secondary | ICD-10-CM | POA: Diagnosis not present

## 2023-02-04 DIAGNOSIS — K0889 Other specified disorders of teeth and supporting structures: Secondary | ICD-10-CM | POA: Diagnosis not present

## 2023-02-04 MED ORDER — AMOXICILLIN 500 MG PO CAPS
500.0000 mg | ORAL_CAPSULE | Freq: Once | ORAL | Status: AC
  Administered 2023-02-04: 500 mg via ORAL
  Filled 2023-02-04: qty 1

## 2023-02-04 MED ORDER — AMOXICILLIN 500 MG PO CAPS: 500.0000 mg | ORAL_CAPSULE | Freq: Three times a day (TID) | ORAL | 0 refills | Status: DC

## 2023-02-04 NOTE — ED Triage Notes (Signed)
Pt front tooth is chipped, causing her pain since yesterday.

## 2023-02-04 NOTE — ED Provider Notes (Signed)
[place] EMERGENCY DEPARTMENT AT [facility] Provider Note   CSN: [id number] Arrival date & time: 02/04/23  0244     History  Chief Complaint  Patient presents with   Dental Pain    Wendy Morgan is a 47 y.o. female.  47 year old female presents to the emergency department for evaluation of dental pain.  States that she noticed a chip in her right central incisor 2 days ago.  Has been having pain since this time.  This travels a bit under her nose.  She feels that the area is subjectively swollen and that she may be developing a dental infection.  She does have a dentist, but is unable to follow-up until December.  Denies any associated fever, facial trauma.  The history is provided by the patient. No language interpreter was used.  Dental Pain      Home Medications Prior to Admission medications   Medication Sig Start Date End Date Taking? Authorizing Provider  amoxicillin (AMOXIL) 500 MG capsule Take 1 capsule (500 mg total) by mouth 3 (three) times daily. 02/04/23  Yes Antony Madura, PA-C  albuterol (PROVENTIL) (2.5 MG/3ML) 0.083% nebulizer solution Take 3 mLs (2.5 mg total) by nebulization every 4 (four) hours as needed for wheezing or shortness of breath. 09/19/21   Dione Booze, MD  albuterol (VENTOLIN HFA) 108 (90 Base) MCG/ACT inhaler Inhale 1-2 puffs into the lungs every 6 (six) hours as needed for wheezing or shortness of breath. 08/16/21   Claiborne Rigg, NP  Budeson-Glycopyrrol-Formoterol (BREZTRI AEROSPHERE) 160-9-4.8 MCG/ACT AERO Inhale 2 puffs into the lungs in the morning and at bedtime. 10/24/21   Omar Person, MD  gabapentin (NEURONTIN) 300 MG capsule Take 1 capsule (300 mg total) by mouth 2 (two) times daily. Start 1 capsule at bedtime for 7 days, then increase to twice a day 01/10/23   McElwee, Lauren A, NP  Mepolizumab (NUCALA) 100 MG/ML SOAJ Inject 1 mL (100 mg total) into the skin every 28 (twenty-eight) days. 01/31/23   Cobb, Ruby Cola, NP   esomeprazole (NEXIUM) 40 MG capsule TAKE 1 CAPSULE BY MOUTH DAILY 08/17/21 08/18/21  Charlynne Pander, MD  sucralfate (CARAFATE) 1 g tablet TAKE 1 TABLET BY MOUTH 4 TIMES DAILY-WITH MEALS AND AT BEDTIME 08/17/21 08/18/21  Charlynne Pander, MD      Allergies    Patient has no known allergies.    Review of Systems   Review of Systems Ten systems reviewed and are negative for acute change, except as noted in the HPI.    Physical Exam Updated Vital Signs BP (!) 139/92   Pulse 71   Temp 98.1 F (36.7 C) (Oral)   Resp 16   LMP 01/29/2023 (Approximate)   SpO2 97%   Physical Exam Vitals and nursing note reviewed.  Constitutional:      General: She is not in acute distress.    Appearance: She is well-developed. She is not diaphoretic.  HENT:     Head: Normocephalic and atraumatic.     Mouth/Throat:     Comments: Small hole to the superior border of the right central incisor without associated drainage, bleeding.  No significant gingival induration or fluctuance.  No trismus or malocclusion.  Tolerating secretions without issue. Eyes:     General: No scleral icterus.    Extraocular Movements: EOM normal.     Conjunctiva/sclera: Conjunctivae normal.  Pulmonary:     Effort: Pulmonary effort is normal. No respiratory distress.     Comments:  Respirations even and unlabored Musculoskeletal:        General: Normal range of motion.     Cervical back: Normal range of motion.  Skin:    General: Skin is warm and dry.     Coloration: Skin is not pale.     Findings: No erythema or rash.  Neurological:     Mental Status: She is alert and oriented to person, place, and time.  Psychiatric:        Mood and Affect: Mood and affect normal.        Behavior: Behavior normal.     ED Results / Procedures / Treatments   Labs (all labs ordered are listed, but only abnormal results are displayed) Labs Reviewed - No data to display  EKG None  Radiology No results  found.  Procedures Procedures    Medications Ordered in ED Medications  amoxicillin (AMOXIL) capsule 500 mg (500 mg Oral Given 02/04/23 0522)    ED Course/ Medical Decision Making/ A&P                                 Medical Decision Making Risk Prescription drug management.   This patient presents to the ED for concern of facial pan, swelling, this involves an extensive number of treatment options, and is a complaint that carries with it a high risk of complications and morbidity.  The differential diagnosis includes abscess vs cellulitis vs Ludwig's angina    Co morbidities that complicate the patient evaluation  Asthma Obesity    Cardiac Monitoring:  The patient was maintained on a cardiac monitor.  I personally viewed and interpreted the cardiac monitored which showed an underlying rhythm of: NSR   Medicines ordered and prescription drug management:  I ordered medication including Amoxicillin for infection coverage  Reevaluation of the patient after these medicines showed that the patient stayed the same I have reviewed the patients home medicines and have made adjustments as needed   Test Considered:  Panorex    Problem List / ED Course:  Patient with toothache.  No gross abscess.  Exam not concerning for Ludwig's angina or spread of infection.  Will treat with amoxicillin and pain medicine.  Urged patient to follow-up with dentist.     Reevaluation:  After the interventions noted above, I reevaluated the patient and found that they have :improved   Dispostion:  After consideration of the diagnostic results and the patients response to treatment, I feel that the patent would benefit from course of abx, close dental f/u. Return precautions discussed and provided. Patient discharged in stable condition with no unaddressed concerns.          Final Clinical Impression(s) / ED Diagnoses Final diagnoses:  Dentalgia    Rx / DC Orders ED Discharge  Orders          Ordered    amoxicillin (AMOXIL) 500 MG capsule  3 times daily        02/04/23 0515              Antony Madura, PA-C 02/04/23 0543    Maia Plan, MD 02/07/23 1013

## 2023-02-04 NOTE — Discharge Instructions (Addendum)
Follow-up with a dentist.  Only a dentist can fix your problem.  We recommend that you take amoxicillin as prescribed to treat likely underlying infection.  Take 600mg ibuprofen every 6 hours for pain control.  Return to the ED for any new or concerning symptoms. 

## 2023-02-06 ENCOUNTER — Ambulatory Visit: Payer: Medicaid Other | Admitting: Sports Medicine

## 2023-02-16 DIAGNOSIS — Z419 Encounter for procedure for purposes other than remedying health state, unspecified: Secondary | ICD-10-CM | POA: Diagnosis not present

## 2023-02-22 ENCOUNTER — Other Ambulatory Visit (HOSPITAL_COMMUNITY): Payer: Self-pay

## 2023-02-22 NOTE — Progress Notes (Signed)
Spoke with Patient & will pick up on 02/23/23. I have cancel fill & re-drop due to fill date claim to old. Patient was on the Return to stock list from [facility]. Will be moving call as well.

## 2023-02-23 ENCOUNTER — Other Ambulatory Visit (HOSPITAL_COMMUNITY): Payer: Self-pay

## 2023-03-05 ENCOUNTER — Other Ambulatory Visit (HOSPITAL_COMMUNITY): Payer: Self-pay

## 2023-03-12 ENCOUNTER — Other Ambulatory Visit: Payer: Self-pay

## 2023-03-14 ENCOUNTER — Other Ambulatory Visit (HOSPITAL_COMMUNITY): Payer: Self-pay

## 2023-03-14 ENCOUNTER — Encounter (HOSPITAL_COMMUNITY): Payer: Self-pay

## 2023-03-16 ENCOUNTER — Other Ambulatory Visit: Payer: Self-pay

## 2023-03-16 NOTE — Progress Notes (Signed)
Specialty Pharmacy Refill Coordination Note  Wendy Morgan is a 47 y.o. female contacted today regarding refills of specialty medication(s) Mepolizumab   Patient requested [name] at [facility] at [place] date: 03/24/23   Medication will be filled on 03/20/23 since patient next injection date 03/21/23.

## 2023-03-18 DIAGNOSIS — Z419 Encounter for procedure for purposes other than remedying health state, unspecified: Secondary | ICD-10-CM | POA: Diagnosis not present

## 2023-03-20 ENCOUNTER — Other Ambulatory Visit: Payer: Self-pay

## 2023-03-28 ENCOUNTER — Ambulatory Visit: Payer: Medicaid Other | Admitting: Nurse Practitioner

## 2023-03-29 ENCOUNTER — Encounter: Payer: Self-pay | Admitting: Student

## 2023-04-09 ENCOUNTER — Other Ambulatory Visit (HOSPITAL_COMMUNITY): Payer: Self-pay

## 2023-04-09 NOTE — Progress Notes (Signed)
Return to [facility] medication was still ready at [facility] & was never picked up. patient is on call list for refill.

## 2023-04-12 ENCOUNTER — Other Ambulatory Visit (HOSPITAL_COMMUNITY): Payer: Self-pay

## 2023-04-12 ENCOUNTER — Other Ambulatory Visit: Payer: Self-pay

## 2023-04-16 ENCOUNTER — Other Ambulatory Visit: Payer: Self-pay

## 2023-04-18 DIAGNOSIS — Z419 Encounter for procedure for purposes other than remedying health state, unspecified: Secondary | ICD-10-CM | POA: Diagnosis not present

## 2023-04-27 ENCOUNTER — Other Ambulatory Visit: Payer: Self-pay

## 2023-04-30 ENCOUNTER — Other Ambulatory Visit: Payer: Self-pay

## 2023-04-30 ENCOUNTER — Emergency Department (HOSPITAL_COMMUNITY)
Admission: EM | Admit: 2023-04-30 | Discharge: 2023-04-30 | Disposition: A | Discharge status: Home / Self Care | Payer: Medicaid Other | Attending: Emergency Medicine | Admitting: Emergency Medicine

## 2023-04-30 ENCOUNTER — Other Ambulatory Visit (HOSPITAL_COMMUNITY): Payer: Self-pay | Admitting: Pharmacy Technician

## 2023-04-30 ENCOUNTER — Emergency Department (HOSPITAL_COMMUNITY): Payer: Medicaid Other

## 2023-04-30 ENCOUNTER — Other Ambulatory Visit (HOSPITAL_COMMUNITY): Payer: Self-pay

## 2023-04-30 DIAGNOSIS — R058 Other specified cough: Secondary | ICD-10-CM | POA: Diagnosis not present

## 2023-04-30 DIAGNOSIS — R059 Cough, unspecified: Secondary | ICD-10-CM | POA: Diagnosis present

## 2023-04-30 DIAGNOSIS — Z20822 Contact with and (suspected) exposure to covid-19: Secondary | ICD-10-CM | POA: Insufficient documentation

## 2023-04-30 DIAGNOSIS — R0602 Shortness of breath: Secondary | ICD-10-CM | POA: Diagnosis not present

## 2023-04-30 DIAGNOSIS — J4541 Moderate persistent asthma with (acute) exacerbation: Secondary | ICD-10-CM | POA: Insufficient documentation

## 2023-04-30 LAB — RESP PANEL BY RT-PCR (RSV, FLU A&B, COVID)  RVPGX2
Influenza A by PCR: NEGATIVE
Influenza B by PCR: NEGATIVE
Resp Syncytial Virus by PCR: NEGATIVE
SARS Coronavirus 2 by RT PCR: NEGATIVE

## 2023-04-30 MED ORDER — IPRATROPIUM-ALBUTEROL 0.5-2.5 (3) MG/3ML IN SOLN
3.0000 mL | RESPIRATORY_TRACT | 0 refills | Status: DC | PRN
  Filled 2023-04-30: qty 360, 20d supply, fill #0

## 2023-04-30 MED ORDER — IPRATROPIUM-ALBUTEROL 0.5-2.5 (3) MG/3ML IN SOLN
3.0000 mL | Freq: Once | RESPIRATORY_TRACT | Status: AC
  Administered 2023-04-30: 3 mL via RESPIRATORY_TRACT
  Filled 2023-04-30: qty 3

## 2023-04-30 MED ORDER — METHYLPREDNISOLONE SODIUM SUCC 125 MG IJ SOLR
125.0000 mg | Freq: Once | INTRAMUSCULAR | Status: AC
  Administered 2023-04-30: 125 mg via INTRAMUSCULAR
  Filled 2023-04-30: qty 2

## 2023-04-30 MED ORDER — PREDNISONE 20 MG PO TABS
60.0000 mg | ORAL_TABLET | Freq: Every day | ORAL | 0 refills | Status: DC
  Filled 2023-04-30: qty 15, 5d supply, fill #0

## 2023-04-30 NOTE — ED Notes (Signed)
 Patient walked around ED    heart rate went to 101   O2 93-94 while walking    she stated she felt fine while walking

## 2023-04-30 NOTE — Discharge Instructions (Addendum)
 Today you were seen for an asthma exacerbation.  Please pick up your DuoNebs and take as needed every 4 hours.  You may also take Mucinex  to thin your mucus and Robitussin for cough as needed for sleep. Thank you for letting us  treat you today. After reviewing your labs and imaging, I feel you are safe to go home. Please follow up with your PCP in the next several days and provide them with your records from this visit. Return to the Emergency Room if pain becomes severe or symptoms worsen.

## 2023-04-30 NOTE — ED Provider Notes (Signed)
 [place] EMERGENCY DEPARTMENT AT [facility] Provider Note   CSN: [id number] Arrival date & time: 04/30/23  0540     History  Chief Complaint  Patient presents with   Cough    Wendy Morgan is a 48 y.o. female past medical history significant for asthma presents today for productive cough, congestion, and wheezing x 2 days.  Patient states she has been using her albuterol  inhaler and ICS inhaler as prescribed with minimal improvement.  Patient denies fever, chills, headache, sore throat, shortness of breath, chest pain, nausea, vomiting, diarrhea, or bodyaches.  Patient states that her sputum is white cloudy looking.  She does note some mild bilateral ear pain which she attributes to her congestion.   Cough Associated symptoms: ear pain        Home Medications Prior to Admission medications   Medication Sig Start Date End Date Taking? Authorizing Provider  ipratropium-albuterol  (DUONEB) 0.5-2.5 (3) MG/3ML SOLN Take 3 mLs by nebulization every 4 (four) hours as needed. 04/30/23  Yes Montana Fassnacht N, PA-C  amoxicillin  (AMOXIL ) 500 MG capsule Take 1 capsule (500 mg total) by mouth 3 (three) times daily. 02/04/23   Michah Minton Sor, PA-C  Budeson-Glycopyrrol-Formoterol  (BREZTRI  AEROSPHERE) 160-9-4.8 MCG/ACT AERO Inhale 2 puffs into the lungs in the morning and at bedtime. 10/24/21   Gladis Leonor CHRISTELLA, MD  gabapentin  (NEURONTIN ) 300 MG capsule Take 1 capsule (300 mg total) by mouth 2 (two) times daily. Start 1 capsule at bedtime for 7 days, then increase to twice a day 01/10/23   McElwee, Lauren A, NP  Mepolizumab  (NUCALA ) 100 MG/ML SOAJ Inject 1 mL (100 mg total) into the skin every 28 (twenty-eight) days. 01/31/23   Cobb, Comer GAILS, NP  esomeprazole  (NEXIUM ) 40 MG capsule TAKE 1 CAPSULE BY MOUTH DAILY 08/17/21 08/18/21  Patt Alm Macho, MD  sucralfate  (CARAFATE ) 1 g tablet TAKE 1 TABLET BY MOUTH 4 TIMES DAILY-WITH MEALS AND AT BEDTIME 08/17/21 08/18/21  Patt Alm Macho, MD       Allergies    Patient has no known allergies.    Review of Systems   Review of Systems  HENT:  Positive for congestion and ear pain.   Respiratory:  Positive for cough.     Physical Exam Updated Vital Signs BP 118/81   Pulse 70   Temp 97.9 F (36.6 C) (Oral)   Resp 16   Ht 5' 4 (1.626 m)   Wt 90.7 kg   SpO2 99%   BMI 34.33 kg/m  Physical Exam Vitals and nursing note reviewed.  Constitutional:      General: She is not in acute distress.    Appearance: She is well-developed.  HENT:     Head: Normocephalic and atraumatic.     Jaw: There is normal jaw occlusion.     Right Ear: Tympanic membrane and external ear normal.     Left Ear: Tympanic membrane and external ear normal.     Nose: Congestion present. No rhinorrhea.     Mouth/Throat:     Mouth: Mucous membranes are moist.     Pharynx: Uvula midline. No pharyngeal swelling, oropharyngeal exudate or posterior oropharyngeal erythema.     Tonsils: No tonsillar exudate or tonsillar abscesses.  Eyes:     Extraocular Movements: Extraocular movements intact.     Conjunctiva/sclera: Conjunctivae normal.  Cardiovascular:     Rate and Rhythm: Normal rate and regular rhythm.     Pulses: Normal pulses.     Heart sounds: Normal heart  sounds. No murmur heard. Pulmonary:     Effort: Pulmonary effort is normal. Prolonged expiration present. No respiratory distress.     Breath sounds: Wheezing present.  Abdominal:     Palpations: Abdomen is soft.     Tenderness: There is no abdominal tenderness.  Musculoskeletal:        General: No swelling. Normal range of motion.     Cervical back: Normal range of motion and neck supple. No tenderness.  Skin:    General: Skin is warm and dry.     Capillary Refill: Capillary refill takes less than 2 seconds.  Neurological:     General: No focal deficit present.     Mental Status: She is alert.     Motor: No weakness.  Psychiatric:        Mood and Affect: Mood normal.     ED Results  / Procedures / Treatments   Labs (all labs ordered are listed, but only abnormal results are displayed) Labs Reviewed  RESP PANEL BY RT-PCR (RSV, FLU A&B, COVID)  [id number]    EKG None  Radiology DG Chest Portable 1 View Result Date: 04/30/2023 CLINICAL DATA:  Shortness of breath and productive cough for 2 days. EXAM: PORTABLE CHEST 1 VIEW COMPARISON:  Chest PA Lat 11/28/2022 FINDINGS: The heart size and mediastinal contours are within normal limits. Both lungs are clear. The visualized skeletal structures are unremarkable. IMPRESSION: No active disease.  Stable chest. Electronically Signed   By: Francis Quam M.D.   On: 04/30/2023 06:35    Procedures Procedures    Medications Ordered in ED Medications  ipratropium-albuterol  (DUONEB) 0.5-2.5 (3) MG/3ML nebulizer solution 3 mL (3 mLs Nebulization Given 04/30/23 0657)  methylPREDNISolone  sodium succinate (SOLU-MEDROL ) 125 mg/2 mL injection 125 mg (125 mg Intramuscular Given 04/30/23 0735)    ED Course/ Medical Decision Making/ A&P                                 Medical Decision Making Amount and/or Complexity of Data Reviewed Radiology: ordered.  Risk Prescription drug management.   This patient presents to the ED with chief complaint(s) of cough, congestion, wheezing with pertinent past medical history of asthma which further complicates the presenting complaint. The complaint involves an extensive differential diagnosis and also carries with it a high risk of complications and morbidity.    The differential diagnosis includes asthma exacerbation, COVID, flu, RSV, pneumonia, upper respiratory illness  Additional history obtained: Records reviewed Primary Care Documents  ED Course and Reassessment: Patient given DuoNeb and reassessed with mild improvement in wheezing and prolonged expiration Patient given IM Solu-Medrol  Patient able to ambulate with pulse oximetry and maintain SpO2 of 93% or higher  Independent labs  interpretation:  The following labs were independently interpreted:  Respiratory panel: Negative  Independent visualization of imaging: - I independently visualized the following imaging with scope of interpretation limited to determining acute life threatening conditions related to emergency care: Chest x-ray, which revealed no active disease  Consultation: - Consulted or discussed management/test interpretation w/ external professional: None  Consideration for admission or further workup: Considered for admission or further workup however patient's vital signs, physical exam, labs, and imaging of all been reassuring.  Patient able to ambulate on room air while maintaining SpO2 of 93% or higher.  Patient given DuoNeb and ensured patient had adequate supply of usual asthma medications.  Patient should follow-up with PCP if symptoms persist for  further evaluation and treatment.         Final Clinical Impression(s) / ED Diagnoses Final diagnoses:  Moderate persistent asthma with exacerbation    Rx / DC Orders ED Discharge Orders          Ordered    ipratropium-albuterol  (DUONEB) 0.5-2.5 (3) MG/3ML SOLN  Every 4 hours PRN        04/30/23 0737              Francis Ileana SAILOR, PA-C 04/30/23 0827    Griselda Norris, MD 04/30/23 [phone]

## 2023-04-30 NOTE — ED Triage Notes (Signed)
 Pt reports productive cough x2 days and feeling of congestion in right lung. Hx asthma, been using inhaler at home.

## 2023-04-30 NOTE — Progress Notes (Signed)
 Specialty Pharmacy Refill Coordination Note  DALE STRAUSSER is a 48 y.o. female contacted today regarding refills of specialty medication(s) Mepolizumab  (Nucala )   Patient requested Marylyn at [facility] at [place] date: 04/30/23   Medication will be filled on 04/30/23.

## 2023-05-17 ENCOUNTER — Other Ambulatory Visit: Payer: Self-pay

## 2023-05-17 ENCOUNTER — Other Ambulatory Visit (HOSPITAL_COMMUNITY): Payer: Self-pay

## 2023-05-17 ENCOUNTER — Other Ambulatory Visit: Payer: Self-pay | Admitting: Nurse Practitioner

## 2023-05-17 DIAGNOSIS — J4550 Severe persistent asthma, uncomplicated: Secondary | ICD-10-CM

## 2023-05-17 MED ORDER — NUCALA 100 MG/ML ~~LOC~~ SOAJ
100.0000 mg | SUBCUTANEOUS | 0 refills | Status: AC
  Filled 2023-05-17: qty 1, 28d supply, fill #0

## 2023-05-17 NOTE — Telephone Encounter (Signed)
One month supply sent. Staff message sent to front desk to have patient scheduled for f/u appt. No further refills after this  Chesley Mires, PharmD, MPH, BCPS, CPP Clinical Pharmacist (Rheumatology and Pulmonology)

## 2023-05-17 NOTE — Telephone Encounter (Signed)
Pt overdue for follow up.

## 2023-05-17 NOTE — Progress Notes (Signed)
Specialty Pharmacy Refill Coordination Note  Wendy Morgan is a 48 y.o. female contacted today regarding refills of specialty medication(s) Mepolizumab [name]   Patient requested [name] at [facility] at [place] date: 05/25/23   Medication will be filled on 05/24/23.

## 2023-05-19 DIAGNOSIS — Z419 Encounter for procedure for purposes other than remedying health state, unspecified: Secondary | ICD-10-CM | POA: Diagnosis not present

## 2023-05-24 ENCOUNTER — Other Ambulatory Visit: Payer: Self-pay

## 2023-06-02 ENCOUNTER — Other Ambulatory Visit (HOSPITAL_COMMUNITY): Payer: Self-pay

## 2023-06-08 ENCOUNTER — Encounter: Payer: Self-pay | Admitting: Nurse Practitioner

## 2023-06-08 ENCOUNTER — Other Ambulatory Visit: Payer: Self-pay

## 2023-06-08 ENCOUNTER — Telehealth: Payer: Medicaid Other | Admitting: Nurse Practitioner

## 2023-06-08 VITALS — Ht 64.0 in | Wt 200.0 lb

## 2023-06-08 DIAGNOSIS — J40 Bronchitis, not specified as acute or chronic: Secondary | ICD-10-CM | POA: Diagnosis not present

## 2023-06-08 MED ORDER — BENZONATATE 100 MG PO CAPS
100.0000 mg | ORAL_CAPSULE | Freq: Three times a day (TID) | ORAL | 0 refills | Status: DC | PRN
  Filled 2023-06-08: qty 30, 10d supply, fill #0

## 2023-06-08 NOTE — Progress Notes (Signed)
[facility] [address] [place] [address] Dept: [phone] Dept Fax: [phone]  Telephone Visit  I connected with Wendy Morgan on 06/08/23 at  9:00 AM EST by telephone and verified that I am speaking with the correct person using two identifiers.  Location patient: Home Location provider: Clinic Persons participating in the virtual visit: Patient; Rodman Pickle, NP; Malena Peer, CMA  Interactive audio and video telecommunications were attempted between this provider the patient, however failed due to patient not having access to video capability. We continued and completed the visit with audio only.   I discussed the limitations of evaluation and management by telemedicine and the availability of in person appointments. The patient expressed understanding and agreed to proceed.  Chief Complaint  Patient presents with   Cough    Hard cough for 2 weeks, coughing up thick mucus    SUBJECTIVE:  HPI: Wendy Morgan is a 48 y.o. female who presents with productive cough for 2 weeks. She states that normally it is her asthma flaring up, however this time she is not having any trouble with shortness of breath or wheezing.   COUGH  Duration: weeks Circumstances of initial development of cough: URI Cough severity: moderate Cough description: productive Aggravating factors:  exercise Alleviating factors: nothing Status:  better Treatments attempted: cough medicine Wheezing: no Shortness of breath: no Chest pain: no Chest tightness:no Nasal congestion: at the beginning, now resolved Runny nose: no Postnasal drip: at the beginning, now resolved Frequent throat clearing or swallowing: no Hemoptysis: no Fevers: no Night sweats: no Weight loss: no Heartburn: no Recent foreign travel: no Tuberculosis contacts: no  Past Medical History:  Diagnosis Date   Asthma    Bronchiectasis with acute  exacerbation (HCC)    Class 1 obesity 04/11/2021    Past Surgical History:  Procedure Laterality Date   TUBAL LIGATION      Family History  Problem Relation Age of Onset   Healthy Mother    Healthy Father    Kidney disease Neg Hx    Anxiety disorder Neg Hx    Depression Neg Hx     Social History   Tobacco Use   Smoking status: Former    Current packs/day: 0.00    Average packs/day: 0.3 packs/day for 6.0 years (1.5 ttl pk-yrs)    Types: Cigarettes    Start date: 26    Quit date: 1998    Years since quitting: 27.1    Passive exposure: Never   Smokeless tobacco: Never  Vaping Use   Vaping status: Never Used  Substance Use Topics   Alcohol use: No   Drug use: No     Current Outpatient Medications:    benzonatate (TESSALON) 100 MG capsule, Take 1 capsule (100 mg total) by mouth 3 (three) times daily as needed for cough., Disp: 30 capsule, Rfl: 0   Budeson-Glycopyrrol-Formoterol (BREZTRI AEROSPHERE) 160-9-4.8 MCG/ACT AERO, Inhale 2 puffs into the lungs in the morning and at bedtime., Disp: 5.9 g, Rfl: 0   Mepolizumab (NUCALA) 100 MG/ML SOAJ, Inject 1 mL (100 mg total) into the skin every 28 (twenty-eight) days. **needs appt for further refills; no further refills until seen in office**, Disp: 1 mL, Rfl: 0   amoxicillin (AMOXIL) 500 MG capsule, Take 1 capsule (500 mg total) by mouth 3 (three) times daily. (Patient not taking: Reported on 06/08/2023), Disp: 30 capsule, Rfl: 0   gabapentin (NEURONTIN) 300 MG capsule, Take 1 capsule (300  mg total) by mouth 2 (two) times daily. Start 1 capsule at bedtime for 7 days, then increase to twice a day (Patient not taking: Reported on 06/08/2023), Disp: 60 capsule, Rfl: 1   ipratropium-albuterol (DUONEB) 0.5-2.5 (3) MG/3ML SOLN, Take 3 mLs by nebulization every 4 (four) hours as needed. (Patient not taking: Reported on 06/08/2023), Disp: 360 mL, Rfl: 0   predniSONE (DELTASONE) 20 MG tablet, Take 3 tablets (60 mg total) by mouth daily for 5  days (Patient not taking: Reported on 06/08/2023), Disp: 15 tablet, Rfl: 0  No Known Allergies  ROS: See pertinent positives and negatives per HPI.  OBSERVATIONS/OBJECTIVE:  VITALS per patient if applicable: Vitals:   06/08/23 0857  Weight: 200 lb (90.7 kg)  Height: 5\' 4"  (1.626 m)     ASSESSMENT AND PLAN:  Problem List Items Addressed This Visit   None Visit Diagnoses       Bronchitis    -  Primary   No asthma symptoms at this time. Symptoms are improving. Encourage fluids, rest. Start tessalon TID prn cough. F/U if not improving.       I discussed the assessment and treatment plan with the patient. The patient was provided an opportunity to ask questions and all were answered. The patient agreed with the plan and demonstrated an understanding of the instructions.   The patient was advised to call back or seek an in-person evaluation if the symptoms worsen or if the condition fails to improve as anticipated.  I spent 20 minutes on this telephone encounter.  Gerre Scull, NP

## 2023-06-08 NOTE — Patient Instructions (Signed)
It was great to see you!  Keep drinking plenty of fluids  Start tessalon capsule 3 times a day as needed for cough  Let's follow-up if your symptoms worsen or don't improve.   Take care,  Rodman Pickle, NP

## 2023-06-16 DIAGNOSIS — Z419 Encounter for procedure for purposes other than remedying health state, unspecified: Secondary | ICD-10-CM | POA: Diagnosis not present

## 2023-06-19 ENCOUNTER — Other Ambulatory Visit: Payer: Self-pay

## 2023-06-21 ENCOUNTER — Other Ambulatory Visit: Payer: Self-pay

## 2023-06-21 NOTE — Progress Notes (Signed)
 Specialty Pharmacy Refill Coordination Note  Wendy Morgan is a 48 y.o. female contacted today regarding refills of specialty medication(s) Mepolizumab [name]   Patient requested (Patient-Rptd) Pickup at [facility] at [facility] date: (Patient-Rptd) 07/02/23   Medication will be filled on 03.14.25.

## 2023-07-18 ENCOUNTER — Other Ambulatory Visit: Payer: Self-pay

## 2023-07-20 ENCOUNTER — Other Ambulatory Visit: Payer: Self-pay

## 2023-07-20 ENCOUNTER — Other Ambulatory Visit: Payer: Self-pay | Admitting: Nurse Practitioner

## 2023-07-20 DIAGNOSIS — J4550 Severe persistent asthma, uncomplicated: Secondary | ICD-10-CM

## 2023-07-20 NOTE — Progress Notes (Signed)
 Refills were denied - patient needs appointment. Attempted to reach patient by phone, but there was no answer and no voice mail. Sent MyChart message informing of denial.

## 2023-07-20 NOTE — Progress Notes (Signed)
 Medication was not filled last month in error - sending refill request to [facility] for Nucala and will fill when approved.

## 2023-07-23 ENCOUNTER — Other Ambulatory Visit: Payer: Self-pay

## 2023-07-26 ENCOUNTER — Other Ambulatory Visit (HOSPITAL_COMMUNITY): Payer: Self-pay

## 2023-07-28 DIAGNOSIS — Z419 Encounter for procedure for purposes other than remedying health state, unspecified: Secondary | ICD-10-CM | POA: Diagnosis not present

## 2023-08-01 ENCOUNTER — Ambulatory Visit
Admission: EM | Admit: 2023-08-01 | Discharge: 2023-08-01 | Disposition: A | Discharge status: Home / Self Care | Attending: Family Medicine | Admitting: Family Medicine

## 2023-08-01 DIAGNOSIS — J309 Allergic rhinitis, unspecified: Secondary | ICD-10-CM

## 2023-08-01 DIAGNOSIS — J4530 Mild persistent asthma, uncomplicated: Secondary | ICD-10-CM | POA: Diagnosis not present

## 2023-08-01 DIAGNOSIS — J988 Other specified respiratory disorders: Secondary | ICD-10-CM | POA: Diagnosis not present

## 2023-08-01 DIAGNOSIS — B9789 Other viral agents as the cause of diseases classified elsewhere: Secondary | ICD-10-CM

## 2023-08-01 LAB — POC COVID19/FLU A&B COMBO
Covid Antigen, POC: NEGATIVE
Influenza A Antigen, POC: NEGATIVE
Influenza B Antigen, POC: NEGATIVE

## 2023-08-01 MED ORDER — METHYLPREDNISOLONE ACETATE 40 MG/ML IJ SUSP
40.0000 mg | Freq: Once | INTRAMUSCULAR | Status: AC
  Administered 2023-08-01: 40 mg via INTRAMUSCULAR

## 2023-08-01 MED ORDER — PROMETHAZINE-DM 6.25-15 MG/5ML PO SYRP: 5.0000 mL | ORAL_SOLUTION | Freq: Three times a day (TID) | ORAL | 0 refills | Status: DC | PRN

## 2023-08-01 MED ORDER — PSEUDOEPHEDRINE HCL 30 MG PO TABS: 30.0000 mg | ORAL_TABLET | Freq: Three times a day (TID) | ORAL | 0 refills | Status: DC | PRN

## 2023-08-01 MED ORDER — CETIRIZINE HCL 10 MG PO TABS: 10.0000 mg | ORAL_TABLET | Freq: Every day | ORAL | 0 refills | Status: AC

## 2023-08-01 NOTE — ED Provider Notes (Signed)
 [facility]  Note:  This document was prepared using Conservation officer, historic buildings and may include unintentional dictation errors.  MRN: [id number] DOB: Oct 16, 1975  Subjective:   Wendy Morgan is a 48 y.o. female presenting for 2-day history of wheezing, chest tightness, chest congestion, sinus congestion and coughing.  Has a history of reactive airway disease, allergic rhinitis, sinus infections.  Has been using her albuterol and Breztri consistently.  Would like a COVID and flu test.  No current facility-administered medications for this encounter.  Current Outpatient Medications:    amoxicillin (AMOXIL) 500 MG capsule, Take 1 capsule (500 mg total) by mouth 3 (three) times daily. (Patient not taking: Reported on 06/08/2023), Disp: 30 capsule, Rfl: 0   benzonatate (TESSALON) 100 MG capsule, Take 1 capsule (100 mg total) by mouth 3 (three) times daily as needed for cough., Disp: 30 capsule, Rfl: 0   Budeson-Glycopyrrol-Formoterol (BREZTRI AEROSPHERE) 160-9-4.8 MCG/ACT AERO, Inhale 2 puffs into the lungs in the morning and at bedtime., Disp: 5.9 g, Rfl: 0   gabapentin (NEURONTIN) 300 MG capsule, Take 1 capsule (300 mg total) by mouth 2 (two) times daily. Start 1 capsule at bedtime for 7 days, then increase to twice a day (Patient not taking: Reported on 06/08/2023), Disp: 60 capsule, Rfl: 1   ipratropium-albuterol (DUONEB) 0.5-2.5 (3) MG/3ML SOLN, Take 3 mLs by nebulization every 4 (four) hours as needed. (Patient not taking: Reported on 06/08/2023), Disp: 360 mL, Rfl: 0   Mepolizumab (NUCALA) 100 MG/ML SOAJ, Inject 1 mL (100 mg total) into the skin every 28 (twenty-eight) days. **needs appt for further refills; no further refills until seen in office**, Disp: 1 mL, Rfl: 0   predniSONE (DELTASONE) 20 MG tablet, Take 3 tablets (60 mg total) by mouth daily for 5 days (Patient not taking: Reported on 06/08/2023), Disp: 15 tablet, Rfl: 0   No Known Allergies  Past  Medical History:  Diagnosis Date   Asthma    Bronchiectasis with acute exacerbation (HCC)    Class 1 obesity 04/11/2021     Past Surgical History:  Procedure Laterality Date   TUBAL LIGATION      Family History  Problem Relation Age of Onset   Healthy Mother    Healthy Father    Kidney disease Neg Hx    Anxiety disorder Neg Hx    Depression Neg Hx     Social History   Tobacco Use   Smoking status: Former    Current packs/day: 0.00    Average packs/day: 0.3 packs/day for 6.0 years (1.5 ttl pk-yrs)    Types: Cigarettes    Start date: 73    Quit date: 1998    Years since quitting: 27.3    Passive exposure: Never   Smokeless tobacco: Never  Vaping Use   Vaping status: Never Used  Substance Use Topics   Alcohol use: No   Drug use: No    ROS   Objective:   Vitals: BP 127/85 (BP Location: Left Arm)   Pulse 89   Temp 98.2 F (36.8 C) (Oral)   Resp 20   LMP 07/17/2023   SpO2 94%   Physical Exam Constitutional:      General: She is not in acute distress.    Appearance: Normal appearance. She is well-developed and normal weight. She is not ill-appearing, toxic-appearing or diaphoretic.  HENT:     Head: Normocephalic and atraumatic.     Right Ear: Tympanic membrane, ear canal and external ear  normal. No drainage or tenderness. No middle ear effusion. There is no impacted cerumen. Tympanic membrane is not erythematous or bulging.     Left Ear: Tympanic membrane, ear canal and external ear normal. No drainage or tenderness.  No middle ear effusion. There is no impacted cerumen. Tympanic membrane is not erythematous or bulging.     Nose: Congestion and rhinorrhea present.     Mouth/Throat:     Mouth: Mucous membranes are moist. No oral lesions.     Pharynx: No pharyngeal swelling, oropharyngeal exudate, posterior oropharyngeal erythema or uvula swelling.     Tonsils: No tonsillar exudate or tonsillar abscesses.  Eyes:     General: No scleral icterus.        Right eye: No discharge.        Left eye: No discharge.     Extraocular Movements: Extraocular movements intact.     Right eye: Normal extraocular motion.     Left eye: Normal extraocular motion.     Conjunctiva/sclera: Conjunctivae normal.  Cardiovascular:     Rate and Rhythm: Normal rate and regular rhythm.     Heart sounds: Normal heart sounds. No murmur heard.    No friction rub. No gallop.  Pulmonary:     Effort: Pulmonary effort is normal. No respiratory distress.     Breath sounds: No stridor. Wheezing (trace throughout) present. No rhonchi or rales.  Chest:     Chest wall: No tenderness.  Musculoskeletal:     Cervical back: Normal range of motion and neck supple.  Lymphadenopathy:     Cervical: No cervical adenopathy.  Skin:    General: Skin is warm and dry.  Neurological:     General: No focal deficit present.     Mental Status: She is alert and oriented to person, place, and time.  Psychiatric:        Mood and Affect: Mood normal.        Behavior: Behavior normal.     Results for orders placed or performed during the hospital encounter of 08/01/23 (from the past 24 hours)  POC Covid19/Flu A&B Antigen     Status: None   Collection Time: 08/01/23  2:16 PM  Result Value Ref Range   Influenza A Antigen, POC Negative Negative   Influenza B Antigen, POC Negative Negative   Covid Antigen, POC Negative Negative    Assessment and Plan :   PDMP not reviewed this encounter.  1. Viral respiratory infection   2. Allergic rhinitis, unspecified seasonality, unspecified trigger   3. Mild persistent asthma, uncomplicated    Recommend supportive care for her viral respiratory infection.  Given her acute asthma exacerbation, allergic rhinitis flare recommended an oral prednisone course.  Will defer imaging for now.  Counseled patient on potential for adverse effects with medications prescribed/recommended today, ER and return-to-clinic precautions discussed, patient verbalized  understanding.    Adolph Hoop, [place] 08/01/23 1621

## 2023-08-01 NOTE — Discharge Instructions (Addendum)
 We will manage this as a viral respiratory infection. For sore throat or cough try using a honey-based tea. Use 3 teaspoons of honey with juice squeezed from half lemon. Place shaved pieces of ginger into 1/2-1 cup of water and warm over stove top. Then mix the ingredients and repeat every 4 hours as needed. Please take Tylenol 500mg -650mg  once every 6 hours for fevers, aches and pains. Hydrate very well with at least 2 liters (64 ounces) of water. Eat light meals such as soups (chicken and noodles, chicken wild rice, vegetable).  Do not eat any foods that you are allergic to.  Start an antihistamine like Zyrtec (10mg  daily) for postnasal drainage, sinus congestion.  You can take this together with pseudoephedrine (Sudafed) at a dose of 30 mg 3 times a day or twice daily as needed for the same kind of congestion.  However, limit your use of pseudoephedrine if you have high blood pressure or avoid altogether if you have abnormal heart rhythms, heart condition.

## 2023-08-01 NOTE — ED Triage Notes (Signed)
 Pt c/o cough, nasal congestion, wheezing x 2 days-using albuterol inhaler and breztri inhaler-NAD-steady gait

## 2023-08-02 ENCOUNTER — Ambulatory Visit: Admitting: Family Medicine

## 2023-08-03 ENCOUNTER — Emergency Department (HOSPITAL_COMMUNITY)
Admission: EM | Admit: 2023-08-03 | Discharge: 2023-08-03 | Disposition: A | Discharge status: Home / Self Care | Attending: Emergency Medicine | Admitting: Emergency Medicine

## 2023-08-03 ENCOUNTER — Other Ambulatory Visit: Payer: Self-pay

## 2023-08-03 ENCOUNTER — Emergency Department (HOSPITAL_COMMUNITY)

## 2023-08-03 ENCOUNTER — Encounter (HOSPITAL_COMMUNITY): Payer: Self-pay | Admitting: *Deleted

## 2023-08-03 DIAGNOSIS — J45909 Unspecified asthma, uncomplicated: Secondary | ICD-10-CM | POA: Diagnosis not present

## 2023-08-03 DIAGNOSIS — R059 Cough, unspecified: Secondary | ICD-10-CM | POA: Diagnosis not present

## 2023-08-03 DIAGNOSIS — R0602 Shortness of breath: Secondary | ICD-10-CM | POA: Diagnosis present

## 2023-08-03 DIAGNOSIS — J4520 Mild intermittent asthma, uncomplicated: Secondary | ICD-10-CM

## 2023-08-03 MED ORDER — PREDNISONE 10 MG (21) PO TBPK: ORAL_TABLET | Freq: Every day | ORAL | 0 refills | Status: DC

## 2023-08-03 MED ORDER — IPRATROPIUM BROMIDE 0.02 % IN SOLN
0.5000 mg | Freq: Once | RESPIRATORY_TRACT | Status: AC
  Administered 2023-08-03: 0.5 mg via RESPIRATORY_TRACT
  Filled 2023-08-03: qty 2.5

## 2023-08-03 MED ORDER — ALBUTEROL SULFATE (2.5 MG/3ML) 0.083% IN NEBU
5.0000 mg | INHALATION_SOLUTION | Freq: Once | RESPIRATORY_TRACT | Status: AC
  Administered 2023-08-03: 5 mg via RESPIRATORY_TRACT
  Filled 2023-08-03: qty 6

## 2023-08-03 MED ORDER — PREDNISONE 20 MG PO TABS
60.0000 mg | ORAL_TABLET | Freq: Once | ORAL | Status: AC
  Administered 2023-08-03: 60 mg via ORAL
  Filled 2023-08-03: qty 3

## 2023-08-03 NOTE — ED Triage Notes (Signed)
 Pt states she has been having Asthma flare for about a week. She was seen at [facility] on 4/16 and given steroid injection. Symptoms are still present.

## 2023-08-03 NOTE — ED Provider Notes (Signed)
 [place] EMERGENCY DEPARTMENT AT [facility] Provider Note   CSN: [id number] Arrival date & time: 08/03/23  4098     History  No chief complaint on file.   Wendy Morgan is a 48 y.o. female.  48 year old female with history of asthma presents with persistent shortness of breath.  Seen in urgent care 2 days ago with similar symptoms at that visit was reviewed and she had a negative COVID and flu test.  States that she has been using her home inhaler with limited relief.  Also has a steroid inhaler.  States her cough is now clear.  Denies any fever or chills.  No severe dyspnea exertion.  Denies any use of tobacco products       Home Medications Prior to Admission medications   Medication Sig Start Date End Date Taking? Authorizing Provider  amoxicillin  (AMOXIL ) 500 MG capsule Take 1 capsule (500 mg total) by mouth 3 (three) times daily. Patient not taking: Reported on 06/08/2023 02/04/23   Carleton Cheek, PA-C  benzonatate  (TESSALON ) 100 MG capsule Take 1 capsule (100 mg total) by mouth 3 (three) times daily as needed for cough. 06/08/23   McElwee, Lauren A, NP  Budeson-Glycopyrrol-Formoterol  (BREZTRI  AEROSPHERE) 160-9-4.8 MCG/ACT AERO Inhale 2 puffs into the lungs in the morning and at bedtime. 10/24/21   Gloriajean Large, MD  cetirizine  (ZYRTEC  ALLERGY) 10 MG tablet Take 1 tablet (10 mg total) by mouth daily. 08/01/23   Adolph Hoop, PA-C  gabapentin  (NEURONTIN ) 300 MG capsule Take 1 capsule (300 mg total) by mouth 2 (two) times daily. Start 1 capsule at bedtime for 7 days, then increase to twice a day Patient not taking: Reported on 06/08/2023 01/10/23   McElwee, Barbra Ley A, NP  ipratropium-albuterol  (DUONEB) 0.5-2.5 (3) MG/3ML SOLN Take 3 mLs by nebulization every 4 (four) hours as needed. Patient not taking: Reported on 06/08/2023 04/30/23   Carie Charity, PA-C  Mepolizumab  (NUCALA ) 100 MG/ML SOAJ Inject 1 mL (100 mg total) into the skin every 28 (twenty-eight) days.  **needs appt for further refills; no further refills until seen in office** 05/17/23   Cobb, Mariah Shines, NP  predniSONE  (DELTASONE ) 20 MG tablet Take 3 tablets (60 mg total) by mouth daily for 5 days Patient not taking: Reported on 06/08/2023 04/30/23     promethazine -dextromethorphan  (PROMETHAZINE -DM) 6.25-15 MG/5ML syrup Take 5 mLs by mouth 3 (three) times daily as needed for cough. 08/01/23   Adolph Hoop, PA-C  pseudoephedrine  (SUDAFED) 30 MG tablet Take 1 tablet (30 mg total) by mouth every 8 (eight) hours as needed for congestion. 08/01/23   Adolph Hoop, PA-C  esomeprazole  (NEXIUM ) 40 MG capsule TAKE 1 CAPSULE BY MOUTH DAILY 08/17/21 08/18/21  Dalene Duck, MD  sucralfate  (CARAFATE ) 1 g tablet TAKE 1 TABLET BY MOUTH 4 TIMES DAILY-WITH MEALS AND AT BEDTIME 08/17/21 08/18/21  Dalene Duck, MD      Allergies    Patient has no known allergies.    Review of Systems   Review of Systems  All other systems reviewed and are negative.   Physical Exam Updated Vital Signs BP (!) 125/90 (BP Location: Left Arm)   Pulse 77   Temp 97.8 F (36.6 C)   Resp 17   Ht 1.626 m (5\' 4" )   Wt 90.7 kg   LMP 07/17/2023   SpO2 97%   BMI 34.33 kg/m  Physical Exam Vitals and nursing note reviewed.  Constitutional:      General: She is not  in acute distress.    Appearance: Normal appearance. She is well-developed. She is not toxic-appearing.  HENT:     Head: Normocephalic and atraumatic.  Eyes:     General: Lids are normal.     Conjunctiva/sclera: Conjunctivae normal.     Pupils: Pupils are equal, round, and reactive to light.  Neck:     Thyroid: No thyroid mass.     Trachea: No tracheal deviation.  Cardiovascular:     Rate and Rhythm: Normal rate and regular rhythm.     Heart sounds: Normal heart sounds. No murmur heard.    No gallop.  Pulmonary:     Effort: Pulmonary effort is normal. No respiratory distress.     Breath sounds: No stridor. Examination of the right-upper field reveals  wheezing. Examination of the left-upper field reveals wheezing. Wheezing present. No decreased breath sounds, rhonchi or rales.  Abdominal:     General: There is no distension.     Palpations: Abdomen is soft.     Tenderness: There is no abdominal tenderness. There is no rebound.  Musculoskeletal:        General: No tenderness. Normal range of motion.     Cervical back: Normal range of motion and neck supple.  Skin:    General: Skin is warm and dry.     Findings: No abrasion or rash.  Neurological:     Mental Status: She is alert and oriented to person, place, and time. Mental status is at baseline.     GCS: GCS eye subscore is 4. GCS verbal subscore is 5. GCS motor subscore is 6.     Cranial Nerves: No cranial nerve deficit.     Sensory: No sensory deficit.     Motor: Motor function is intact.  Psychiatric:        Attention and Perception: Attention normal.        Speech: Speech normal.        Behavior: Behavior normal.     ED Results / Procedures / Treatments   Labs (all labs ordered are listed, but only abnormal results are displayed) Labs Reviewed - No data to display  EKG None  Radiology No results found.  Procedures Procedures    Medications Ordered in ED Medications  albuterol  (PROVENTIL ) (2.5 MG/3ML) 0.083% nebulizer solution 5 mg (has no administration in time range)  ipratropium (ATROVENT ) nebulizer solution 0.5 mg (has no administration in time range)  predniSONE  (DELTASONE ) tablet 60 mg (has no administration in time range)    ED Course/ Medical Decision Making/ A&P                                 Medical Decision Making Amount and/or Complexity of Data Reviewed Radiology: ordered.  Risk Prescription drug management.   The patient given albuterol  and Atrovent  and prednisone  and feels better.  Chest x-ray per dictation shows no acute events.  Will discharge home with steroid taper        Final Clinical Impression(s) / ED Diagnoses Final  diagnoses:  None    Rx / DC Orders ED Discharge Orders     None         Lind Repine, MD 08/03/23 1204

## 2023-08-27 ENCOUNTER — Other Ambulatory Visit (HOSPITAL_COMMUNITY): Payer: Self-pay

## 2023-08-27 DIAGNOSIS — Z419 Encounter for procedure for purposes other than remedying health state, unspecified: Secondary | ICD-10-CM | POA: Diagnosis not present

## 2023-09-03 ENCOUNTER — Telehealth: Payer: Self-pay | Admitting: Nurse Practitioner

## 2023-09-03 NOTE — Telephone Encounter (Signed)
 08/02/2023 no show/Dr. Hildy Lowers 11/30/2022 no show with PCP 07/19/2022 no show with PCP  12/01/2022 Final warning letter  Do you want to proceed with dismissal?

## 2023-09-05 NOTE — Telephone Encounter (Signed)
 Dismissal generated - sending via mail and mychart

## 2023-09-07 ENCOUNTER — Other Ambulatory Visit (HOSPITAL_COMMUNITY): Payer: Self-pay

## 2023-09-11 ENCOUNTER — Other Ambulatory Visit: Payer: Self-pay

## 2023-09-13 ENCOUNTER — Encounter: Payer: Self-pay | Admitting: Pulmonary Disease

## 2023-09-13 ENCOUNTER — Ambulatory Visit: Admitting: Pulmonary Disease

## 2023-09-14 ENCOUNTER — Other Ambulatory Visit: Payer: Self-pay

## 2023-09-14 NOTE — Progress Notes (Signed)
 Patient has missed multiple appointments and was dismissed from practice per chart. Disenrolled.

## 2023-09-27 DIAGNOSIS — Z419 Encounter for procedure for purposes other than remedying health state, unspecified: Secondary | ICD-10-CM | POA: Diagnosis not present

## 2023-10-11 ENCOUNTER — Other Ambulatory Visit: Payer: Self-pay

## 2023-10-11 ENCOUNTER — Ambulatory Visit
Admission: EM | Admit: 2023-10-11 | Discharge: 2023-10-11 | Disposition: A | Discharge status: Home / Self Care | Attending: Family Medicine | Admitting: Family Medicine

## 2023-10-11 DIAGNOSIS — J4541 Moderate persistent asthma with (acute) exacerbation: Secondary | ICD-10-CM | POA: Diagnosis not present

## 2023-10-11 MED ORDER — METHYLPREDNISOLONE ACETATE 80 MG/ML IJ SUSP
80.0000 mg | Freq: Once | INTRAMUSCULAR | Status: AC
  Administered 2023-10-11: 80 mg via INTRAMUSCULAR

## 2023-10-11 MED ORDER — IPRATROPIUM-ALBUTEROL 0.5-2.5 (3) MG/3ML IN SOLN
3.0000 mL | RESPIRATORY_TRACT | 0 refills | Status: DC | PRN
  Filled 2023-10-11: qty 90, 5d supply, fill #0

## 2023-10-11 MED ORDER — IPRATROPIUM-ALBUTEROL 0.5-2.5 (3) MG/3ML IN SOLN: 3.0000 mL | Freq: Once | RESPIRATORY_TRACT | Status: DC

## 2023-10-11 NOTE — ED Provider Notes (Signed)
 [facility]  Note:  This document was prepared using Conservation officer, historic buildings and may include unintentional dictation errors.  MRN: [id number] DOB: 1976/02/21  Subjective:   Wendy Morgan is a 48 y.o. female presenting for 1 day history of acute onset persistent shortness of breath, wheezing.  Patient has chronic moderate persistent asthma.  Uses Breztri  daily.  Does not have a nebulizer machine, has previously tried to obtain one without success.  She has been using her albuterol  inhaler with some relief.  Has not seen a pulmonologist in some time.  Denies fever, sinus pain, chest pain, nausea, vomiting, abdominal pain, body pains.  No rashes.  No changes to bowel or urinary habits.  Of note, patient has previously required high-dose steroids to help through her asthma exacerbations.  No current facility-administered medications for this encounter.  Current Outpatient Medications:    amoxicillin  (AMOXIL ) 500 MG capsule, Take 1 capsule (500 mg total) by mouth 3 (three) times daily. (Patient not taking: Reported on 06/08/2023), Disp: 30 capsule, Rfl: 0   benzonatate  (TESSALON ) 100 MG capsule, Take 1 capsule (100 mg total) by mouth 3 (three) times daily as needed for cough., Disp: 30 capsule, Rfl: 0   Budeson-Glycopyrrol-Formoterol  (BREZTRI  AEROSPHERE) 160-9-4.8 MCG/ACT AERO, Inhale 2 puffs into the lungs in the morning and at bedtime., Disp: 5.9 g, Rfl: 0   cetirizine  (ZYRTEC  ALLERGY) 10 MG tablet, Take 1 tablet (10 mg total) by mouth daily., Disp: 30 tablet, Rfl: 0   gabapentin  (NEURONTIN ) 300 MG capsule, Take 1 capsule (300 mg total) by mouth 2 (two) times daily. Start 1 capsule at bedtime for 7 days, then increase to twice a day (Patient not taking: Reported on 06/08/2023), Disp: 60 capsule, Rfl: 1   ipratropium-albuterol  (DUONEB) 0.5-2.5 (3) MG/3ML SOLN, Take 3 mLs by nebulization every 4 (four) hours as needed. (Patient not taking: Reported on 06/08/2023),  Disp: 360 mL, Rfl: 0   Mepolizumab  (NUCALA ) 100 MG/ML SOAJ, Inject 1 mL (100 mg total) into the skin every 28 (twenty-eight) days. **needs appt for further refills; no further refills until seen in office**, Disp: 1 mL, Rfl: 0   predniSONE  (DELTASONE ) 20 MG tablet, Take 3 tablets (60 mg total) by mouth daily for 5 days (Patient not taking: Reported on 06/08/2023), Disp: 15 tablet, Rfl: 0   predniSONE  (STERAPRED UNI-PAK 21 TAB) 10 MG (21) TBPK tablet, Take by mouth daily. Take 6 tabs by mouth daily  for 2 days, then 5 tabs for 2 days, then 4 tabs for 2 days, then 3 tabs for 2 days, 2 tabs for 2 days, then 1 tab by mouth daily for 2 days, Disp: 42 tablet, Rfl: 0   promethazine -dextromethorphan  (PROMETHAZINE -DM) 6.25-15 MG/5ML syrup, Take 5 mLs by mouth 3 (three) times daily as needed for cough., Disp: 200 mL, Rfl: 0   pseudoephedrine  (SUDAFED) 30 MG tablet, Take 1 tablet (30 mg total) by mouth every 8 (eight) hours as needed for congestion., Disp: 30 tablet, Rfl: 0   No Known Allergies  Past Medical History:  Diagnosis Date   Asthma    Bronchiectasis with acute exacerbation (HCC)    Class 1 obesity 04/11/2021     Past Surgical History:  Procedure Laterality Date   TUBAL LIGATION      Family History  Problem Relation Age of Onset   Healthy Mother    Healthy Father    Kidney disease Neg Hx    Anxiety disorder Neg Hx    Depression  Neg Hx     Social History   Tobacco Use   Smoking status: Former    Current packs/day: 0.00    Average packs/day: 0.3 packs/day for 6.0 years (1.5 ttl pk-yrs)    Types: Cigarettes    Start date: 68    Quit date: 1998    Years since quitting: 27.5    Passive exposure: Never   Smokeless tobacco: Never  Vaping Use   Vaping status: Never Used  Substance Use Topics   Alcohol use: No   Drug use: No    ROS   Objective:   Vitals: BP 112/78 (BP Location: Right Arm)   Pulse 82   Temp 98.5 F (36.9 C) (Oral)   Resp 18   LMP 10/09/2023 (Exact  Date)   SpO2 95%   Physical Exam Constitutional:      General: She is not in acute distress.    Appearance: Normal appearance. She is well-developed. She is not ill-appearing, toxic-appearing or diaphoretic.  HENT:     Head: Normocephalic and atraumatic.     Nose: Nose normal.     Mouth/Throat:     Mouth: Mucous membranes are moist.   Eyes:     General: No scleral icterus.       Right eye: No discharge.        Left eye: No discharge.     Extraocular Movements: Extraocular movements intact.    Cardiovascular:     Rate and Rhythm: Normal rate and regular rhythm.     Heart sounds: Normal heart sounds. No murmur heard.    No friction rub. No gallop.  Pulmonary:     Effort: Pulmonary effort is normal. No respiratory distress.     Breath sounds: No stridor. Wheezing (trace and mild throughout) present. No rhonchi or rales.  Chest:     Chest wall: No tenderness.   Skin:    General: Skin is warm and dry.   Neurological:     General: No focal deficit present.     Mental Status: She is alert and oriented to person, place, and time.   Psychiatric:        Mood and Affect: Mood normal.        Behavior: Behavior normal.    IM Depo-Medrol  80 mg administered in clinic.  Nebulizer machine dispensed to patient in clinic.  Assessment and Plan :   PDMP not reviewed this encounter.  1. Moderate persistent asthma with (acute) exacerbation    Above interventions were done.  Recommend using DuoNeb treatments at home.  Follow-up with pulmonologist as soon as possible.  Will defer ER visit as she is not in respiratory distress and is hemodynamically stable.  Counseled patient on potential for adverse effects with medications prescribed/recommended today, ER and return-to-clinic precautions discussed, patient verbalized understanding.    Christopher Savannah, [place] 10/11/23 1148

## 2023-10-11 NOTE — ED Triage Notes (Signed)
 Pt reports wheezing since lats night. States her inhalers gave some relief.

## 2023-10-13 ENCOUNTER — Encounter (HOSPITAL_COMMUNITY): Payer: Self-pay

## 2023-10-13 ENCOUNTER — Emergency Department (HOSPITAL_COMMUNITY)

## 2023-10-13 ENCOUNTER — Emergency Department (HOSPITAL_COMMUNITY)
Admission: EM | Admit: 2023-10-13 | Discharge: 2023-10-13 | Disposition: A | Discharge status: Home / Self Care | Attending: Emergency Medicine | Admitting: Emergency Medicine

## 2023-10-13 DIAGNOSIS — J4521 Mild intermittent asthma with (acute) exacerbation: Secondary | ICD-10-CM | POA: Diagnosis not present

## 2023-10-13 DIAGNOSIS — J4541 Moderate persistent asthma with (acute) exacerbation: Secondary | ICD-10-CM | POA: Insufficient documentation

## 2023-10-13 DIAGNOSIS — J45901 Unspecified asthma with (acute) exacerbation: Secondary | ICD-10-CM | POA: Diagnosis not present

## 2023-10-13 MED ORDER — MAGNESIUM SULFATE 50 % IJ SOLN: 1.0000 g | Freq: Once | INTRAMUSCULAR | Status: DC

## 2023-10-13 MED ORDER — ALBUTEROL SULFATE (2.5 MG/3ML) 0.083% IN NEBU
5.0000 mg | INHALATION_SOLUTION | Freq: Once | RESPIRATORY_TRACT | Status: AC
  Administered 2023-10-13: 5 mg via RESPIRATORY_TRACT
  Filled 2023-10-13: qty 6

## 2023-10-13 MED ORDER — MAGNESIUM SULFATE IN D5W 1-5 GM/100ML-% IV SOLN
1.0000 g | Freq: Once | INTRAVENOUS | Status: AC
  Administered 2023-10-13: 1 g via INTRAVENOUS
  Filled 2023-10-13: qty 100

## 2023-10-13 MED ORDER — IPRATROPIUM BROMIDE 0.02 % IN SOLN
0.5000 mg | Freq: Once | RESPIRATORY_TRACT | Status: AC
  Administered 2023-10-13: 0.5 mg via RESPIRATORY_TRACT
  Filled 2023-10-13: qty 2.5

## 2023-10-13 MED ORDER — PREDNISONE 50 MG PO TABS
50.0000 mg | ORAL_TABLET | Freq: Every day | ORAL | 0 refills | Status: AC
Start: 2023-10-14 — End: 2023-10-17

## 2023-10-13 MED ORDER — METHYLPREDNISOLONE SODIUM SUCC 125 MG IJ SOLR
125.0000 mg | Freq: Once | INTRAMUSCULAR | Status: AC
  Administered 2023-10-13: 125 mg via INTRAVENOUS
  Filled 2023-10-13: qty 2

## 2023-10-13 NOTE — ED Provider Notes (Signed)
 [place] EMERGENCY DEPARTMENT AT [facility] Provider Note   CSN: [id number] Arrival date & time: 10/13/23  1158     Patient presents with: Asthma   Wendy Morgan is a 48 y.o. female.   Patient with history of asthma presents with persistent wheezing x 3 days without fever. No chest pain. She was seen on 6/26 at [facility] and provided a nebulizer machine with medication for duonebs. She has done this every 4 hours without relief. She was given a steroid shot but no prescription for steroids at that time. Seen again at Urgent Care ([facility]) today but was unable to fill the steroid Rx today. Wheezing was persistent so she presented to ED. No vomiting.    Asthma       Prior to Admission medications   Medication Sig Start Date End Date Taking? Authorizing Provider  predniSONE  (DELTASONE ) 50 MG tablet Take 1 tablet (50 mg total) by mouth daily for 3 days. 10/14/23 10/17/23 Yes Jolyne Laye, Margit, PA-C  amoxicillin  (AMOXIL ) 500 MG capsule Take 1 capsule (500 mg total) by mouth 3 (three) times daily. Patient not taking: Reported on 06/08/2023 02/04/23   Keith Sor, PA-C  benzonatate  (TESSALON ) 100 MG capsule Take 1 capsule (100 mg total) by mouth 3 (three) times daily as needed for cough. 06/08/23   McElwee, Lauren A, NP  Budeson-Glycopyrrol-Formoterol  (BREZTRI  AEROSPHERE) 160-9-4.8 MCG/ACT AERO Inhale 2 puffs into the lungs in the morning and at bedtime. 10/24/21   Gladis Leonor CHRISTELLA, MD  cetirizine  (ZYRTEC  ALLERGY) 10 MG tablet Take 1 tablet (10 mg total) by mouth daily. 08/01/23   Christopher Savannah, PA-C  gabapentin  (NEURONTIN ) 300 MG capsule Take 1 capsule (300 mg total) by mouth 2 (two) times daily. Start 1 capsule at bedtime for 7 days, then increase to twice a day Patient not taking: Reported on 06/08/2023 01/10/23   McElwee, Lauren A, NP  ipratropium-albuterol  (DUONEB) 0.5-2.5 (3) MG/3ML SOLN Take 3 mLs by nebulization every 4 (four) hours as needed. 10/11/23   Christopher Savannah, PA-C   Mepolizumab  (NUCALA ) 100 MG/ML SOAJ Inject 1 mL (100 mg total) into the skin every 28 (twenty-eight) days. **needs appt for further refills; no further refills until seen in office** 05/17/23   Cobb, Comer GAILS, NP  promethazine -dextromethorphan  (PROMETHAZINE -DM) 6.25-15 MG/5ML syrup Take 5 mLs by mouth 3 (three) times daily as needed for cough. 08/01/23   Christopher Savannah, PA-C  pseudoephedrine  (SUDAFED) 30 MG tablet Take 1 tablet (30 mg total) by mouth every 8 (eight) hours as needed for congestion. 08/01/23   Christopher Savannah, PA-C  esomeprazole  (NEXIUM ) 40 MG capsule TAKE 1 CAPSULE BY MOUTH DAILY 08/17/21 08/18/21  Patt Alm Macho, MD  sucralfate  (CARAFATE ) 1 g tablet TAKE 1 TABLET BY MOUTH 4 TIMES DAILY-WITH MEALS AND AT BEDTIME 08/17/21 08/18/21  Patt Alm Macho, MD    Allergies: Patient has no known allergies.    Review of Systems  Updated Vital Signs BP (!) 145/76   Pulse 74   Temp 98.2 F (36.8 C) (Oral)   Resp 18   Ht 5' 4 (1.626 m)   Wt 90.7 kg   LMP 10/09/2023 (Exact Date)   SpO2 95%   BMI 34.33 kg/m   Physical Exam Vitals and nursing note reviewed.  Constitutional:      General: She is not in acute distress.    Appearance: She is well-developed.  HENT:     Head: Normocephalic.   Cardiovascular:     Rate and Rhythm: Normal  rate and regular rhythm.     Heart sounds: No murmur heard. Pulmonary:     Effort: Pulmonary effort is normal.     Breath sounds: Wheezing (Inspiratory and expiratory wheezing. Shortened breath movement. Prolonged expiration.) present. No rhonchi or rales.  Abdominal:     General: Bowel sounds are normal.     Palpations: Abdomen is soft.     Tenderness: There is no abdominal tenderness. There is no guarding or rebound.   Musculoskeletal:        General: Normal range of motion.     Cervical back: Normal range of motion and neck supple.   Skin:    General: Skin is warm and dry.   Neurological:     General: No focal deficit present.     Mental  Status: She is alert and oriented to person, place, and time.     (all labs ordered are listed, but only abnormal results are displayed) Labs Reviewed - No data to display  EKG: None  Radiology: DG Chest Portable 1 View Result Date: 10/13/2023 CLINICAL DATA:  Asthma exacerbation 3 days. No chest pain. Shortness of breath. EXAM: PORTABLE CHEST 1 VIEW COMPARISON:  08/03/2023 FINDINGS: Lungs are adequately inflated and otherwise clear. Cardiomediastinal silhouette and remainder of the exam is unchanged. IMPRESSION: No active disease. Electronically Signed   By: Toribio Agreste M.D.   On: 10/13/2023 13:37     Procedures   Medications Ordered in the ED  magnesium  sulfate IVPB 1 g 100 mL (1 g Intravenous New Bag/Given 10/13/23 1309)  methylPREDNISolone  sodium succinate (SOLU-MEDROL ) 125 mg/2 mL injection 125 mg (125 mg Intravenous Given 10/13/23 1309)  albuterol  (PROVENTIL ) (2.5 MG/3ML) 0.083% nebulizer solution 5 mg (5 mg Nebulization Given 10/13/23 1308)  ipratropium (ATROVENT ) nebulizer solution 0.5 mg (0.5 mg Nebulization Given 10/13/23 1308)                                    Medical Decision Making This patient presents to the ED for concern of wheezing, this involves an extensive number of treatment options, and is a complaint that carries with it a high risk of complications and morbidity.  The differential diagnosis includes severe or persistent Asthma, aspiration, PNA, PTX, PE   Co morbidities that complicate the patient evaluation  H/o asthma, bronchiectasis   Additional history obtained:  Additional history and/or information obtained from chart review, notable for Urgent Care visits reviewed 6/26 and earlier today   Lab Tests:  I Ordered, and personally interpreted labs.  The pertinent results include:  n/a    Imaging Studies ordered:  I ordered imaging studies including CXR I independently visualized and interpreted imaging which showed NAD I agree with the  radiologist interpretation   Cardiac Monitoring:  The patient was maintained on a cardiac monitor.  I personally viewed and interpreted the cardiac monitored which showed an underlying rhythm of: n/a   Medicines ordered and prescription drug management:  I ordered medication including Magnesium , Albuterol /atrovent /solumedrol  for wheezing Reevaluation of the patient after these medicines showed that the patient resolved I have reviewed the patients home medicines and have made adjustments as needed   Test Considered:  N/a   Critical Interventions:  N/a   Consultations Obtained:  I requested consultation with the n/a,  and discussed lab and imaging findings as well as pertinent plan - they recommend: n/a   Problem List / ED Course:  Here  with persistent wheezing despite appropriate treatment. No fever, no pain    Reevaluation:  After the interventions noted above, I reevaluated the patient and found that they have :resolved   Social Determinants of Health:  Former smoker   Disposition:  After consideration of the diagnostic results and the patients response to treatment, I feel that the patient would benefit from discharge home.   Amount and/or Complexity of Data Reviewed Radiology: ordered.  Risk Prescription drug management.        Final diagnoses:  Moderate persistent asthma with exacerbation    ED Discharge Orders          Ordered    predniSONE  (DELTASONE ) 50 MG tablet  Daily        10/13/23 1405               Odell Balls, PA-C 10/13/23 1408    Lenor Hollering, MD 10/14/23 [phone]

## 2023-10-13 NOTE — ED Notes (Addendum)
 Writer checked on pt. Pt breathing normal with equal rise and fall of the chest. Pt speaking on phone having a conversation without any speech difficulty. No signs of respiratory distress currently.

## 2023-10-13 NOTE — ED Triage Notes (Signed)
 Pt c/o asthma exacerbation x3 days.  Denies pain.  Pt reports going to UC for same and given a breathing treatment and prescription for prednisone , but she has been unable to pick-up the prescription d/t pharmacy being closed on the weekends.  Pt is able to speak full sentences.  Pt reports she is needing a new pulmonologist.

## 2023-10-13 NOTE — Discharge Instructions (Signed)
 As we discussed: Use the DueNeb (both albuterol  and atrovent ) nebulized treatment twice daily as needed Use Albuterol  5 mg nebulized treatment every 4-6 hours as needed (counting DuoNebs in that timeframe) Take prednisone  once daily starting tomorrow for 3 days total  Follow up with your primary care provider as needed if symptoms are uncontrolled, or return to the ED when needed for urgent treatment.

## 2023-10-15 ENCOUNTER — Other Ambulatory Visit: Payer: Self-pay

## 2023-10-15 MED ORDER — PREDNISONE 10 MG (48) PO TBPK
ORAL_TABLET | ORAL | 0 refills | Status: DC
  Filled 2023-10-15: qty 48, 12d supply, fill #0

## 2023-10-20 ENCOUNTER — Other Ambulatory Visit: Payer: Self-pay

## 2023-10-20 ENCOUNTER — Emergency Department (HOSPITAL_COMMUNITY)
Admission: EM | Admit: 2023-10-20 | Discharge: 2023-10-20 | Disposition: A | Discharge status: Home / Self Care | Attending: Emergency Medicine | Admitting: Emergency Medicine

## 2023-10-20 ENCOUNTER — Encounter (HOSPITAL_COMMUNITY): Payer: Self-pay | Admitting: Emergency Medicine

## 2023-10-20 DIAGNOSIS — R062 Wheezing: Secondary | ICD-10-CM | POA: Diagnosis present

## 2023-10-20 DIAGNOSIS — Z87891 Personal history of nicotine dependence: Secondary | ICD-10-CM | POA: Diagnosis not present

## 2023-10-20 DIAGNOSIS — J45901 Unspecified asthma with (acute) exacerbation: Secondary | ICD-10-CM | POA: Diagnosis not present

## 2023-10-20 MED ORDER — DEXAMETHASONE SODIUM PHOSPHATE 10 MG/ML IJ SOLN
10.0000 mg | Freq: Once | INTRAMUSCULAR | Status: AC
  Administered 2023-10-20: 10 mg via INTRAMUSCULAR
  Filled 2023-10-20: qty 1

## 2023-10-20 MED ORDER — IPRATROPIUM-ALBUTEROL 0.5-2.5 (3) MG/3ML IN SOLN
3.0000 mL | Freq: Once | RESPIRATORY_TRACT | Status: AC
  Administered 2023-10-20: 3 mL via RESPIRATORY_TRACT
  Filled 2023-10-20: qty 3

## 2023-10-20 NOTE — ED Provider Notes (Signed)
 [facility]-EMERGENCY DEPT [facility] Emergency Department Provider Note MRN:  [id number]  Arrival date & time: 10/20/23     Chief Complaint   Wheezing   History of Present Illness   Wendy Morgan is a 48 y.o. year-old female with a history of asthma presenting to the ED with chief complaint of wheezing.  Return of wheezing over the past few days.  Associated with shortness of breath.  No chest pain, no fever or cough.  Review of Systems  A thorough review of systems was obtained and all systems are negative except as noted in the HPI and PMH.   Patient's Health History    Past Medical History:  Diagnosis Date   Asthma    Bronchiectasis with acute exacerbation (HCC)    Class 1 obesity 04/11/2021    Past Surgical History:  Procedure Laterality Date   TUBAL LIGATION      Family History  Problem Relation Age of Onset   Healthy Mother    Healthy Father    Kidney disease Neg Hx    Anxiety disorder Neg Hx    Depression Neg Hx     Social History   Socioeconomic History   Marital status: Single    Spouse name: Not on file   Number of children: 4   Years of education: Not on file   Highest education level: Not on file  Occupational History   Not on file  Tobacco Use   Smoking status: Former    Current packs/day: 0.00    Average packs/day: 0.3 packs/day for 6.0 years (1.5 ttl pk-yrs)    Types: Cigarettes    Start date: 64    Quit date: 31    Years since quitting: 27.5    Passive exposure: Never   Smokeless tobacco: Never  Vaping Use   Vaping status: Never Used  Substance and Sexual Activity   Alcohol use: No   Drug use: No   Sexual activity: Yes    Birth control/protection: Surgical  Other Topics Concern   Not on file  Social History Narrative   Not on file   Social Drivers of Health   Financial Resource Strain: Not on file  Food Insecurity: No Food Insecurity (07/21/2022)   Hunger Vital Sign    Worried About Running Out of Food in the Last Year:  Never true    Ran Out of Food in the Last Year: Never true  Transportation Needs: No Transportation Needs (07/21/2022)   PRAPARE - Administrator, Civil Service (Medical): No    Lack of Transportation (Non-Medical): No  Physical Activity: Not on file  Stress: Not on file (02/22/2023)  Social Connections: Not on file  Intimate Partner Violence: Not At Risk (07/21/2022)   Humiliation, Afraid, Rape, and Kick questionnaire    Fear of Current or Ex-Partner: No    Emotionally Abused: No    Physically Abused: No    Sexually Abused: No     Physical Exam   Vitals:   10/20/23 0310  BP: 132/83  Pulse: 83  Resp: 19  Temp: 97.7 F (36.5 C)  SpO2: 96%    CONSTITUTIONAL: Well-appearing, NAD NEURO/PSYCH:  Alert and oriented x 3, no focal deficits EYES:  eyes equal and reactive ENT/NECK:  no LAD, no JVD CARDIO: Regular rate, well-perfused, normal S1 and S2 PULM: Diffuse wheezing, no increased work of breathing GI/GU:  non-distended, non-tender MSK/SPINE:  No gross deformities, no edema SKIN:  no rash, atraumatic   *Additional and/or  pertinent findings included in MDM below  Diagnostic and Interventional Summary    EKG Interpretation Date/Time:    Ventricular Rate:    PR Interval:    QRS Duration:    QT Interval:    QTC Calculation:   R Axis:      Text Interpretation:         Labs Reviewed - No data to display  No orders to display    Medications  dexamethasone  (DECADRON ) injection 10 mg (10 mg Intramuscular Given 10/20/23 0336)  ipratropium-albuterol  (DUONEB) 0.5-2.5 (3) MG/3ML nebulizer solution 3 mL (3 mLs Nebulization Given 10/20/23 0336)     Procedures  /  Critical Care Procedures  ED Course and Medical Decision Making  Initial Impression and Ddx Suspect recurrent asthma exacerbation.  Recent similar in presentation.  Finish 3 days of prednisone  and is feeling better but now feeling worse again.  No recent leg pain or swelling, no evidence of DVT on exam, no  tachycardia, no hypoxia.  Doubt PE.  Past medical/surgical history that increases complexity of ED encounter: Asthma  Interpretation of Diagnostics Laboratory and/or imaging options to aid in the diagnosis/care of the patient were considered.  After careful history and physical examination, it was determined that there was no indication for diagnostics at this time.  Patient Reassessment and Ultimate Disposition/Management     Doing better after DuoNeb appropriate for discharge.  Patient management required discussion with the following services or consulting groups:  None  Complexity of Problems Addressed Acute complicated illness or Injury  Additional Data Reviewed and Analyzed Further history obtained from: None  Additional Factors Impacting ED Encounter Risk None  Ozell HERO. Theadore, MD [facility] Emergency Medicine [facility] [email or web address]  Final Clinical Impressions(s) / ED Diagnoses     ICD-10-CM   1. Exacerbation of asthma, unspecified asthma severity, unspecified whether persistent  J45.901       ED Discharge Orders     None        Discharge Instructions Discussed with and Provided to Patient:    Discharge Instructions      You were evaluated in the Emergency Department and after careful evaluation, we did not find any emergent condition requiring admission or further testing in the hospital.  Your exam/testing today is overall reassuring.  Keep your follow-up and start taking the prednisone  taper on Monday as we discussed.  Consider a daily Claritin  over-the-counter.  Continue your breathing treatments at home.  Please return to the Emergency Department if you experience any worsening of your condition.   Thank you for allowing us  to be a part of your care.      Theadore Ozell HERO, MD 10/20/23 [phone]

## 2023-10-20 NOTE — Discharge Instructions (Signed)
 You were evaluated in the Emergency Department and after careful evaluation, we did not find any emergent condition requiring admission or further testing in the hospital.  Your exam/testing today is overall reassuring.  Keep your follow-up and start taking the prednisone  taper on Monday as we discussed.  Consider a daily Claritin  over-the-counter.  Continue your breathing treatments at home.  Please return to the Emergency Department if you experience any worsening of your condition.   Thank you for allowing us  to be a part of your care.

## 2023-10-20 NOTE — ED Triage Notes (Signed)
 Pt in ambulatory with sob, wheezing - worsened x few days. Pt was seen 6/28 for same and given weekend supply of Prednisone , states she has now finished this. Expiratory wheezes noted, took home neb trx PTA

## 2023-10-22 ENCOUNTER — Other Ambulatory Visit: Payer: Self-pay

## 2023-10-24 ENCOUNTER — Other Ambulatory Visit: Payer: Self-pay

## 2024-02-16 ENCOUNTER — Encounter (HOSPITAL_COMMUNITY): Payer: Self-pay

## 2024-02-16 ENCOUNTER — Emergency Department (HOSPITAL_COMMUNITY)
Admission: EM | Admit: 2024-02-16 | Discharge: 2024-02-16 | Disposition: A | Discharge status: Home / Self Care | Payer: Self-pay | Attending: Emergency Medicine | Admitting: Emergency Medicine

## 2024-02-16 ENCOUNTER — Emergency Department (HOSPITAL_COMMUNITY): Payer: Self-pay

## 2024-02-16 ENCOUNTER — Other Ambulatory Visit: Payer: Self-pay

## 2024-02-16 DIAGNOSIS — J45901 Unspecified asthma with (acute) exacerbation: Secondary | ICD-10-CM | POA: Insufficient documentation

## 2024-02-16 LAB — RESP PANEL BY RT-PCR (RSV, FLU A&B, COVID)  RVPGX2
Influenza A by PCR: NEGATIVE
Influenza B by PCR: NEGATIVE
Resp Syncytial Virus by PCR: NEGATIVE
SARS Coronavirus 2 by RT PCR: NEGATIVE

## 2024-02-16 MED ORDER — IPRATROPIUM-ALBUTEROL 0.5-2.5 (3) MG/3ML IN SOLN
6.0000 mL | Freq: Once | RESPIRATORY_TRACT | Status: AC
  Administered 2024-02-16: 6 mL via RESPIRATORY_TRACT
  Filled 2024-02-16: qty 6
  Filled 2024-02-16: qty 3

## 2024-02-16 MED ORDER — ALBUTEROL SULFATE HFA 108 (90 BASE) MCG/ACT IN AERS: 2.0000 | INHALATION_SPRAY | RESPIRATORY_TRACT | 2 refills | Status: AC | PRN

## 2024-02-16 MED ORDER — PREDNISONE 20 MG PO TABS: 40.0000 mg | ORAL_TABLET | Freq: Every day | ORAL | 0 refills | Status: AC

## 2024-02-16 MED ORDER — IPRATROPIUM-ALBUTEROL 0.5-2.5 (3) MG/3ML IN SOLN
3.0000 mL | Freq: Once | RESPIRATORY_TRACT | Status: AC
  Administered 2024-02-16: 3 mL via RESPIRATORY_TRACT
  Filled 2024-02-16: qty 3

## 2024-02-16 MED ORDER — IPRATROPIUM-ALBUTEROL 0.5-2.5 (3) MG/3ML IN SOLN: 3.0000 mL | RESPIRATORY_TRACT | 0 refills | Status: AC | PRN

## 2024-02-16 MED ORDER — PREDNISONE 20 MG PO TABS
60.0000 mg | ORAL_TABLET | Freq: Once | ORAL | Status: AC
  Administered 2024-02-16: 60 mg via ORAL
  Filled 2024-02-16: qty 3

## 2024-02-16 NOTE — Discharge Instructions (Addendum)
 You were seen for your asthma exacerbation in the emergency department.  Your COVID and flu test were negative.  Your chest x-ray did not show pneumonia.  At home, please use your inhalers for any wheezing.  Please take the steroids we have prescribed you for your asthma exacerbation as well.    We have also prescribed you an additional inhaler or nebulizer solution if you should need it  Check your MyChart online for the results of any tests that had not resulted by the time you left the emergency department.   Follow-up with your primary doctor in 2-3 days regarding your visit.    Return immediately to the emergency department if you experience any of the following: Difficulty breathing, chest pain, or any other concerning symptoms.    Thank you for visiting our Emergency Department. It was a pleasure taking care of you today.

## 2024-02-16 NOTE — ED Triage Notes (Signed)
 Pt reports hx of asthma with flare up x2 days. Pt reports taking albuterol  nebulizer at home without relief. Pt reports pain with coughing. Pt with even respirations in triage.

## 2024-02-20 NOTE — ED Provider Notes (Signed)
 [place] EMERGENCY DEPARTMENT AT [facility] Provider Note   CSN: [id number] Arrival date & time: 02/16/24  2020     Patient presents with: Asthma   Wendy Morgan is a 48 y.o. female.  {Add pertinent medical, surgical, social history, OB history to HPI:32947} HPI     Prior to Admission medications   Medication Sig Start Date End Date Taking? Authorizing Provider  albuterol  (VENTOLIN  HFA) 108 (90 Base) MCG/ACT inhaler Inhale 2 puffs into the lungs every 4 (four) hours as needed for wheezing or shortness of breath. 02/16/24  Yes Yolande Lamar BROCKS, MD  predniSONE  (DELTASONE ) 20 MG tablet Take 2 tablets (40 mg total) by mouth daily for 4 days. 02/16/24 02/20/24 Yes Yolande Lamar BROCKS, MD  amoxicillin  (AMOXIL ) 500 MG capsule Take 1 capsule (500 mg total) by mouth 3 (three) times daily. Patient not taking: Reported on 06/08/2023 02/04/23   Keith Sor, PA-C  benzonatate  (TESSALON ) 100 MG capsule Take 1 capsule (100 mg total) by mouth 3 (three) times daily as needed for cough. Patient not taking: Reported on 10/20/2023 06/08/23   Nedra Tinnie LABOR, NP  Budeson-Glycopyrrol-Formoterol  (BREZTRI  AEROSPHERE) 160-9-4.8 MCG/ACT AERO Inhale 2 puffs into the lungs in the morning and at bedtime. 10/24/21   Gladis Leonor CHRISTELLA, MD  cetirizine  (ZYRTEC  ALLERGY) 10 MG tablet Take 1 tablet (10 mg total) by mouth daily. Patient not taking: Reported on 10/20/2023 08/01/23   Christopher Savannah, PA-C  gabapentin  (NEURONTIN ) 300 MG capsule Take 1 capsule (300 mg total) by mouth 2 (two) times daily. Start 1 capsule at bedtime for 7 days, then increase to twice a day Patient not taking: Reported on 06/08/2023 01/10/23   McElwee, Lauren A, NP  ipratropium-albuterol  (DUONEB) 0.5-2.5 (3) MG/3ML SOLN Take 3 mLs by nebulization every 4 (four) hours as needed. 02/16/24   Yolande Lamar BROCKS, MD  Mepolizumab  (NUCALA ) 100 MG/ML SOAJ Inject 1 mL (100 mg total) into the skin every 28 (twenty-eight) days. **needs appt for  further refills; no further refills until seen in office** 05/17/23   Cobb, Comer GAILS, NP  promethazine -dextromethorphan  (PROMETHAZINE -DM) 6.25-15 MG/5ML syrup Take 5 mLs by mouth 3 (three) times daily as needed for cough. Patient not taking: Reported on 10/20/2023 08/01/23   Christopher Savannah, PA-C  pseudoephedrine  (SUDAFED) 30 MG tablet Take 1 tablet (30 mg total) by mouth every 8 (eight) hours as needed for congestion. Patient not taking: Reported on 10/20/2023 08/01/23   Christopher Savannah, PA-C  esomeprazole  (NEXIUM ) 40 MG capsule TAKE 1 CAPSULE BY MOUTH DAILY 08/17/21 08/18/21  Patt Alm Macho, MD  sucralfate  (CARAFATE ) 1 g tablet TAKE 1 TABLET BY MOUTH 4 TIMES DAILY-WITH MEALS AND AT BEDTIME 08/17/21 08/18/21  Patt Alm Macho, MD    Allergies: Patient has no known allergies.    Review of Systems  Updated Vital Signs BP 132/70   Pulse 88   Temp 97.7 F (36.5 C) (Oral)   Resp 16   LMP 01/30/2024 (Approximate)   SpO2 98%   Physical Exam  (all labs ordered are listed, but only abnormal results are displayed) Labs Reviewed  RESP PANEL BY RT-PCR (RSV, FLU A&B, COVID)  RVPGX2    EKG: None  Radiology: No results found.  {Document cardiac monitor, telemetry assessment procedure when appropriate:32947} Procedures   Medications Ordered in the ED  predniSONE  (DELTASONE ) tablet 60 mg (60 mg Oral Given 02/16/24 2141)  ipratropium-albuterol  (DUONEB) 0.5-2.5 (3) MG/3ML nebulizer solution 6 mL (6 mLs Nebulization Given 02/16/24 2142)  ipratropium-albuterol  (DUONEB) 0.5-2.5 (  3) MG/3ML nebulizer solution 3 mL (3 mLs Nebulization Given 02/16/24 2230)      {Click here for ABCD2, HEART and other calculators REFRESH Note before signing:1}                              Medical Decision Making Amount and/or Complexity of Data Reviewed Radiology: ordered.  Risk Prescription drug management.   ***  {Document critical care time when appropriate  Document review of labs and clinical decision tools ie  CHADS2VASC2, etc  Document your independent review of radiology images and any outside records  Document your discussion with family members, caretakers and with consultants  Document social determinants of health affecting pt's care  Document your decision making why or why not admission, treatments were needed:32947:::1}   Final diagnoses:  Mild asthma with exacerbation, unspecified whether persistent    ED Discharge Orders          Ordered    predniSONE  (DELTASONE ) 20 MG tablet  Daily        02/16/24 2239    ipratropium-albuterol  (DUONEB) 0.5-2.5 (3) MG/3ML SOLN  Every 4 hours PRN       Note to Pharmacy: 90ml stock   02/16/24 2240    albuterol  (VENTOLIN  HFA) 108 (90 Base) MCG/ACT inhaler  Every 4 hours PRN        02/16/24 2240

## 2024-03-28 ENCOUNTER — Ambulatory Visit: Payer: Self-pay | Admitting: Physician Assistant

## 2024-03-28 ENCOUNTER — Encounter: Payer: Self-pay | Admitting: Sports Medicine

## 2024-04-03 ENCOUNTER — Encounter: Payer: Self-pay | Admitting: Sports Medicine

## 2024-04-07 ENCOUNTER — Other Ambulatory Visit: Payer: Self-pay

## 2024-04-07 ENCOUNTER — Emergency Department (HOSPITAL_COMMUNITY)
Admission: EM | Admit: 2024-04-07 | Discharge: 2024-04-07 | Disposition: A | Discharge status: Home / Self Care | Payer: Self-pay

## 2024-04-07 ENCOUNTER — Encounter (HOSPITAL_COMMUNITY): Payer: Self-pay

## 2024-04-07 DIAGNOSIS — R1110 Vomiting, unspecified: Secondary | ICD-10-CM | POA: Insufficient documentation

## 2024-04-07 DIAGNOSIS — R197 Diarrhea, unspecified: Secondary | ICD-10-CM | POA: Insufficient documentation

## 2024-04-07 LAB — CBC WITH DIFFERENTIAL/PLATELET
Abs Immature Granulocytes: 0.09 K/uL — ABNORMAL HIGH (ref 0.00–0.07)
Basophils Absolute: 0.1 K/uL (ref 0.0–0.1)
Basophils Relative: 1 %
Eosinophils Absolute: 0.8 K/uL — ABNORMAL HIGH (ref 0.0–0.5)
Eosinophils Relative: 7 %
HCT: 46.9 % — ABNORMAL HIGH (ref 36.0–46.0)
Hemoglobin: 15.4 g/dL — ABNORMAL HIGH (ref 12.0–15.0)
Immature Granulocytes: 1 %
Lymphocytes Relative: 21 %
Lymphs Abs: 2.2 K/uL (ref 0.7–4.0)
MCH: 28 pg (ref 26.0–34.0)
MCHC: 32.8 g/dL (ref 30.0–36.0)
MCV: 85.3 fL (ref 80.0–100.0)
Monocytes Absolute: 1.4 K/uL — ABNORMAL HIGH (ref 0.1–1.0)
Monocytes Relative: 14 %
Neutro Abs: 5.9 K/uL (ref 1.7–7.7)
Neutrophils Relative %: 56 %
Platelets: 403 K/uL — ABNORMAL HIGH (ref 150–400)
RBC: 5.5 MIL/uL — ABNORMAL HIGH (ref 3.87–5.11)
RDW: 14.6 % (ref 11.5–15.5)
WBC: 10.4 K/uL (ref 4.0–10.5)
nRBC: 0 % (ref 0.0–0.2)

## 2024-04-07 LAB — URINALYSIS, ROUTINE W REFLEX MICROSCOPIC
Bilirubin Urine: NEGATIVE
Glucose, UA: NEGATIVE mg/dL
Hgb urine dipstick: NEGATIVE
Ketones, ur: NEGATIVE mg/dL
Leukocytes,Ua: NEGATIVE
Nitrite: NEGATIVE
Protein, ur: 30 mg/dL — AB
Specific Gravity, Urine: 1.033 — ABNORMAL HIGH (ref 1.005–1.030)
pH: 5 (ref 5.0–8.0)

## 2024-04-07 LAB — COMPREHENSIVE METABOLIC PANEL WITH GFR
ALT: 16 U/L (ref 0–44)
AST: 24 U/L (ref 15–41)
Albumin: 4 g/dL (ref 3.5–5.0)
Alkaline Phosphatase: 83 U/L (ref 38–126)
Anion gap: 10 (ref 5–15)
BUN: 13 mg/dL (ref 6–20)
CO2: 25 mmol/L (ref 22–32)
Calcium: 9.3 mg/dL (ref 8.9–10.3)
Chloride: 102 mmol/L (ref 98–111)
Creatinine, Ser: 0.83 mg/dL (ref 0.44–1.00)
GFR, Estimated: >60 mL/min
Glucose, Bld: 91 mg/dL (ref 70–99)
Potassium: 3.6 mmol/L (ref 3.5–5.1)
Sodium: 136 mmol/L (ref 135–145)
Total Bilirubin: 0.3 mg/dL (ref 0.0–1.2)
Total Protein: 7.4 g/dL (ref 6.5–8.1)

## 2024-04-07 LAB — LIPASE, BLOOD: Lipase: 24 U/L (ref 11–51)

## 2024-04-07 LAB — HCG, SERUM, QUALITATIVE: Preg, Serum: NEGATIVE

## 2024-04-07 MED ORDER — SODIUM CHLORIDE 0.9 % IV BOLUS
1000.0000 mL | Freq: Once | INTRAVENOUS | Status: AC
  Administered 2024-04-07: 1000 mL via INTRAVENOUS

## 2024-04-07 NOTE — ED Triage Notes (Signed)
 Pt states that she has a loud growling noise in abdomen and diarrhea x 2 days. Pt denies any pain nor vomiting.

## 2024-04-07 NOTE — ED Notes (Signed)
 Waiting for fluids to finish before dispo per MD order

## 2024-04-07 NOTE — ED Provider Notes (Signed)
 " [place] EMERGENCY DEPARTMENT AT [facility] Provider Note   CSN: [id number] Arrival date & time: 04/07/24  9441     Patient presents with: Diarrhea   Wendy Morgan is a 48 y.o. female.    Diarrhea  48 year old female presenting with diarrhea.  Patient states that she has had diarrhea for the past 3 days.  She has had 1 vomiting episode during that time.  Today her main complaint is that her stomach has been making a weird noise.  She denies any abdominal pain.  She denies any chest pain or shortness of breath.  She denies any blood in her stool or vomit.  She denies any nausea at this time her last bout of diarrhea was earlier this morning.  She feels as though she has had 5-6 episodes of diarrhea a day.    Prior to Admission medications  Medication Sig Start Date End Date Taking? Authorizing Provider  albuterol  (VENTOLIN  HFA) 108 (90 Base) MCG/ACT inhaler Inhale 2 puffs into the lungs every 4 (four) hours as needed for wheezing or shortness of breath. 02/16/24   Yolande Lamar BROCKS, MD  amoxicillin  (AMOXIL ) 500 MG capsule Take 1 capsule (500 mg total) by mouth 3 (three) times daily. Patient not taking: Reported on 06/08/2023 02/04/23   Keith Sor, PA-C  benzonatate  (TESSALON ) 100 MG capsule Take 1 capsule (100 mg total) by mouth 3 (three) times daily as needed for cough. Patient not taking: Reported on 10/20/2023 06/08/23   Nedra Tinnie LABOR, NP  Budeson-Glycopyrrol-Formoterol  (BREZTRI  AEROSPHERE) 160-9-4.8 MCG/ACT AERO Inhale 2 puffs into the lungs in the morning and at bedtime. 10/24/21   Gladis Leonor CHRISTELLA, MD  cetirizine  (ZYRTEC  ALLERGY) 10 MG tablet Take 1 tablet (10 mg total) by mouth daily. Patient not taking: Reported on 10/20/2023 08/01/23   Christopher Savannah, PA-C  gabapentin  (NEURONTIN ) 300 MG capsule Take 1 capsule (300 mg total) by mouth 2 (two) times daily. Start 1 capsule at bedtime for 7 days, then increase to twice a day Patient not taking: Reported on 06/08/2023  01/10/23   McElwee, Lauren A, NP  ipratropium-albuterol  (DUONEB) 0.5-2.5 (3) MG/3ML SOLN Take 3 mLs by nebulization every 4 (four) hours as needed. 02/16/24   Yolande Lamar BROCKS, MD  Mepolizumab  (NUCALA ) 100 MG/ML SOAJ Inject 1 mL (100 mg total) into the skin every 28 (twenty-eight) days. **needs appt for further refills; no further refills until seen in office** 05/17/23   Cobb, Comer GAILS, NP  promethazine -dextromethorphan  (PROMETHAZINE -DM) 6.25-15 MG/5ML syrup Take 5 mLs by mouth 3 (three) times daily as needed for cough. Patient not taking: Reported on 10/20/2023 08/01/23   Christopher Savannah, PA-C  pseudoephedrine  (SUDAFED) 30 MG tablet Take 1 tablet (30 mg total) by mouth every 8 (eight) hours as needed for congestion. Patient not taking: Reported on 10/20/2023 08/01/23   Christopher Savannah, PA-C  esomeprazole  (NEXIUM ) 40 MG capsule TAKE 1 CAPSULE BY MOUTH DAILY 08/17/21 08/18/21  Patt Alm Macho, MD  sucralfate  (CARAFATE ) 1 g tablet TAKE 1 TABLET BY MOUTH 4 TIMES DAILY-WITH MEALS AND AT BEDTIME 08/17/21 08/18/21  Patt Alm Macho, MD    Allergies: Patient has no known allergies.    Review of Systems  Gastrointestinal:  Positive for diarrhea.  All other systems reviewed and are negative.   Updated Vital Signs BP 131/88 (BP Location: Right Arm)   Pulse 77   Temp 97.6 F (36.4 C) (Oral)   Resp 18   LMP 03/19/2024 (Approximate)   SpO2 97%  Physical Exam Vitals and nursing note reviewed.  HENT:     Mouth/Throat:     Pharynx: Oropharynx is clear.  Cardiovascular:     Rate and Rhythm: Normal rate.     Pulses: Normal pulses.  Pulmonary:     Effort: Pulmonary effort is normal.     Breath sounds: Normal breath sounds.  Abdominal:     General: Abdomen is flat. Bowel sounds are normal. There is no distension.     Palpations: Abdomen is soft.     Tenderness: There is no abdominal tenderness. There is no guarding or rebound. Negative signs include Murphy's sign, Rovsing's sign and McBurney's sign.   Skin:    General: Skin is warm and dry.  Neurological:     General: No focal deficit present.     Mental Status: She is alert.     (all labs ordered are listed, but only abnormal results are displayed) Labs Reviewed - No data to display  EKG: None  Radiology: No results found.   Procedures   Medications Ordered in the ED - No data to display                                  Medical Decision Making  Impression: 48 year old female presenting with diarrhea.  Differential diagnoses include gastroenteritis, enteritis, pancreatitis  Additional History: Patient provided all history.  I also reviewed other outpatient notes.  Labs: No electrolyte abnormalities noted on CMP.  Lipase was within normal limits.  CBC and urinalysis dehydration.  Imaging: Considered CT however patient is not having any abdominal pain and is not actively vomiting.  ED Course/Meds: Patient remained stable while in the ER.  Patient's labs were stable while she was here.  No sign of pancreatitis with a normal lipase.  CBC and urinalysis showed dehydration.  Patient was given a liter of fluid.  Patient reports that she is feeling much better after receiving fluids.  Educated patient to follow-up if this is continuing.  If patient starts to develop any abdominal pain or diarrhea with blood to return to the ER.      Final diagnoses:  None    ED Discharge Orders     None          Wendy Morgan 04/07/24 1046    Ula Prentice SAUNDERS, MD 04/07/24 1422  "

## 2024-04-07 NOTE — Discharge Instructions (Addendum)
 If this continues follow-up with your primary care doctor.  Continue to stay hydrated and eat bland foods.  If you start having abdominal pain or bloody diarrhea please return to the ER.

## 2024-05-04 ENCOUNTER — Emergency Department (HOSPITAL_COMMUNITY)
Admission: EM | Admit: 2024-05-04 | Discharge: 2024-05-04 | Disposition: A | Discharge status: Home / Self Care | Payer: Self-pay | Attending: Emergency Medicine | Admitting: Emergency Medicine

## 2024-05-04 ENCOUNTER — Other Ambulatory Visit: Payer: Self-pay

## 2024-05-04 ENCOUNTER — Encounter (HOSPITAL_COMMUNITY): Payer: Self-pay | Admitting: Pharmacy Technician

## 2024-05-04 DIAGNOSIS — J45901 Unspecified asthma with (acute) exacerbation: Secondary | ICD-10-CM | POA: Insufficient documentation

## 2024-05-04 DIAGNOSIS — Z7951 Long term (current) use of inhaled steroids: Secondary | ICD-10-CM | POA: Insufficient documentation

## 2024-05-04 MED ORDER — ALBUTEROL SULFATE (2.5 MG/3ML) 0.083% IN NEBU: 5.0000 mg | INHALATION_SOLUTION | Freq: Once | RESPIRATORY_TRACT | Status: DC

## 2024-05-04 MED ORDER — IPRATROPIUM BROMIDE 0.02 % IN SOLN: 0.5000 mg | Freq: Once | RESPIRATORY_TRACT | Status: DC

## 2024-05-04 MED ORDER — ALBUTEROL SULFATE (2.5 MG/3ML) 0.083% IN NEBU
5.0000 mg | INHALATION_SOLUTION | Freq: Once | RESPIRATORY_TRACT | Status: AC
  Administered 2024-05-04: 5 mg via RESPIRATORY_TRACT
  Filled 2024-05-04: qty 6

## 2024-05-04 MED ORDER — IPRATROPIUM BROMIDE 0.02 % IN SOLN
0.5000 mg | Freq: Once | RESPIRATORY_TRACT | Status: AC
  Administered 2024-05-04: 0.5 mg via RESPIRATORY_TRACT
  Filled 2024-05-04: qty 2.5

## 2024-05-04 MED ORDER — IPRATROPIUM BROMIDE 0.02 % IN SOLN
0.5000 mg | Freq: Once | RESPIRATORY_TRACT | Status: AC
  Administered 2024-05-04: 0.5 mg via RESPIRATORY_TRACT

## 2024-05-04 MED ORDER — DEXAMETHASONE 4 MG PO TABS
10.0000 mg | ORAL_TABLET | Freq: Once | ORAL | Status: AC
  Administered 2024-05-04: 10 mg via ORAL
  Filled 2024-05-04: qty 1

## 2024-05-04 MED ORDER — ALBUTEROL SULFATE (2.5 MG/3ML) 0.083% IN NEBU
5.0000 mg | INHALATION_SOLUTION | Freq: Once | RESPIRATORY_TRACT | Status: AC
  Administered 2024-05-04: 5 mg via RESPIRATORY_TRACT

## 2024-05-04 MED ORDER — PREDNISONE 20 MG PO TABS: 40.0000 mg | ORAL_TABLET | Freq: Every day | ORAL | 0 refills | Status: DC

## 2024-05-04 NOTE — ED Notes (Signed)
 Ambulated around ED, > 20 feet. Oxygen  sat 100%. JRPRN

## 2024-05-04 NOTE — Discharge Instructions (Addendum)
 You have been prescribed a 5-day course of prednisone .  Continue your other asthma medications as prescribed.  Follow-up with your primary care doctor.  You may return to the ED for new or concerning symptoms.

## 2024-05-04 NOTE — ED Triage Notes (Signed)
 Pt reports asthma exacerbation for the last week.  Has been using inhaler but today not getting relief. Wheezing present.

## 2024-05-04 NOTE — ED Provider Notes (Signed)
 " [place] EMERGENCY DEPARTMENT AT [facility] Provider Note   CSN: [id number] Arrival date & time: 05/04/24  9286     Patient presents with: Asthma   Wendy Morgan is a 49 y.o. female.   49 year old female with a history of asthma and bronchiectasis presents to the emergency department for evaluation of shortness of breath.  Symptoms have been persistent over the past week.  Feels similar to prior asthma exacerbations.  She has not had any fevers.  Reports compliance with her daily medications.  No known sick contacts.  No history of prior intubation secondary to asthma exacerbation.  The history is provided by the patient. No language interpreter was used.  Asthma       Prior to Admission medications  Medication Sig Start Date End Date Taking? Authorizing Provider  predniSONE  (DELTASONE ) 20 MG tablet Take 2 tablets (40 mg total) by mouth daily. 05/04/24  Yes Keith Sor, PA-C  albuterol  (VENTOLIN  HFA) 108 (90 Base) MCG/ACT inhaler Inhale 2 puffs into the lungs every 4 (four) hours as needed for wheezing or shortness of breath. 02/16/24   Yolande Lamar BROCKS, MD  Budeson-Glycopyrrol-Formoterol  (BREZTRI  AEROSPHERE) 160-9-4.8 MCG/ACT AERO Inhale 2 puffs into the lungs in the morning and at bedtime. 10/24/21   Gladis Leonor CHRISTELLA, MD  cetirizine  (ZYRTEC  ALLERGY) 10 MG tablet Take 1 tablet (10 mg total) by mouth daily. Patient not taking: Reported on 10/20/2023 08/01/23   Christopher Savannah, PA-C  ipratropium-albuterol  (DUONEB) 0.5-2.5 (3) MG/3ML SOLN Take 3 mLs by nebulization every 4 (four) hours as needed. 02/16/24   Yolande Lamar BROCKS, MD  Mepolizumab  (NUCALA ) 100 MG/ML SOAJ Inject 1 mL (100 mg total) into the skin every 28 (twenty-eight) days. **needs appt for further refills; no further refills until seen in office** 05/17/23   Malachy Comer GAILS, NP  esomeprazole  (NEXIUM ) 40 MG capsule TAKE 1 CAPSULE BY MOUTH DAILY 08/17/21 08/18/21  Patt Alm Macho, MD  sucralfate  (CARAFATE ) 1 g  tablet TAKE 1 TABLET BY MOUTH 4 TIMES DAILY-WITH MEALS AND AT BEDTIME 08/17/21 08/18/21  Patt Alm Macho, MD    Allergies: Patient has no known allergies.    Review of Systems Ten systems reviewed and are negative for acute change, except as noted in the HPI.    Updated Vital Signs BP (!) 146/85 (BP Location: Right Arm)   Pulse 77   Temp 97.6 F (36.4 C) (Oral)   Resp 18   LMP 03/19/2024 (Approximate)   SpO2 97%   Physical Exam Vitals and nursing note reviewed.  Constitutional:      General: She is not in acute distress.    Appearance: She is well-developed. She is not diaphoretic.     Comments: Nontoxic appearing and in NAD  HENT:     Head: Normocephalic and atraumatic.  Eyes:     General: No scleral icterus.    Conjunctiva/sclera: Conjunctivae normal.  Cardiovascular:     Rate and Rhythm: Normal rate and regular rhythm.     Pulses: Normal pulses.  Pulmonary:     Effort: Pulmonary effort is normal. No respiratory distress.     Breath sounds: No stridor. Wheezing present.     Comments: Wheezing in all lung fields; expiratory.  No accessory muscle use or other signs of respiratory distress. Musculoskeletal:        General: Normal range of motion.     Cervical back: Normal range of motion.  Skin:    General: Skin is warm and dry.  Coloration: Skin is not pale.     Findings: No erythema or rash.  Neurological:     Mental Status: She is alert and oriented to person, place, and time.     Coordination: Coordination normal.  Psychiatric:        Behavior: Behavior normal.     (all labs ordered are listed, but only abnormal results are displayed) Labs Reviewed - No data to display  EKG: None  Radiology: No results found.   Procedures   Medications Ordered in the ED  albuterol  (PROVENTIL ) (2.5 MG/3ML) 0.083% nebulizer solution 5 mg (5 mg Nebulization Given 05/04/24 0754)  ipratropium (ATROVENT ) nebulizer solution 0.5 mg (0.5 mg Nebulization Given 05/04/24 0755)   dexamethasone  (DECADRON ) tablet 10 mg (10 mg Oral Given 05/04/24 0754)  albuterol  (PROVENTIL ) (2.5 MG/3ML) 0.083% nebulizer solution 5 mg (5 mg Nebulization Given 05/04/24 0913)  ipratropium (ATROVENT ) nebulizer solution 0.5 mg (0.5 mg Nebulization Given 05/04/24 0913)    Clinical Course as of 05/04/24 1116  Sun May 04, 2024  0956 Lung sounds improved on repeat auscultation.  Patient states that she is feeling better.  Will monitor for 30 minutes and reassess. [KH]  1107 Ambulatory in the emergency department with sats of 100% on room air.  Denies shortness of breath with exertion. [KH]  1114 Patient declines third DuoNeb.  States that she is ready for discharge. [KH]    Clinical Course User Index [KH] Keith Sor, PA-C                                 Medical Decision Making Risk Prescription drug management.   This patient presents to the ED for concern of SOB, this involves an extensive number of treatment options, and is a complaint that carries with it a high risk of complications and morbidity.  The differential diagnosis includes asthma exacerbation vs PNA vs PTX vs pleural effusion vs viral illness vs COPD vs CHF   Co morbidities that complicate the patient evaluation  Asthma   Additional history obtained:  Additional history obtained from medical records External records from outside source obtained and reviewed including prior discharge summaries.   Cardiac Monitoring:  The patient was maintained on a cardiac monitor.  I personally viewed and interpreted the cardiac monitored which showed an underlying rhythm of: NSR   Medicines ordered and prescription drug management:  I ordered medication including Duoneb and Decadron  for SOB, wheezing  Reevaluation of the patient after these medicines showed that the patient improved I have reviewed the patients home medicines and have made adjustments as needed   Test Considered:  CXR - felt low yield; denies infectious  symptoms   Problem List / ED Course:  As above Patient presenting with SOB x 1 week. Hx of similar symptoms during asthma exacerbations. No hx of fevers or URI symptoms. Doubt PNA. PE also felt unlikely PERC negative. Lung exam improved after nebulizer treatment. Steroids given in the ED and pt will be dc with 5 day prednisone  burst. Pt states they are breathing at baseline. Pt has been instructed to continue using prescribed medications and to speak with PCP about today's exacerbation.    Reevaluation:  After the interventions noted above, I reevaluated the patient and found that they have :improved   Social Determinants of Health:  Lives independently   Dispostion:  After consideration of the diagnostic results and the patients response to treatment, I feel that the  patent would benefit from outpatient course of prednisone . Stressed adherence to current asthma medications. Return precautions discussed and provided. Patient discharged in stable condition with no unaddressed concerns.       Final diagnoses:  Exacerbation of asthma, unspecified asthma severity, unspecified whether persistent    ED Discharge Orders          Ordered    predniSONE  (DELTASONE ) 20 MG tablet  Daily        05/04/24 1115               Keith Sor, PA-C 05/04/24 1553    Franklyn Sid SAILOR, MD 05/04/24 1554  "

## 2024-05-14 ENCOUNTER — Emergency Department (HOSPITAL_COMMUNITY)
Admission: EM | Admit: 2024-05-14 | Discharge: 2024-05-15 | Disposition: A | Discharge status: Home / Self Care | Payer: Self-pay | Attending: Emergency Medicine | Admitting: Emergency Medicine

## 2024-05-14 ENCOUNTER — Emergency Department (HOSPITAL_COMMUNITY): Payer: Self-pay

## 2024-05-14 DIAGNOSIS — J45901 Unspecified asthma with (acute) exacerbation: Secondary | ICD-10-CM | POA: Insufficient documentation

## 2024-05-14 DIAGNOSIS — Z7951 Long term (current) use of inhaled steroids: Secondary | ICD-10-CM | POA: Insufficient documentation

## 2024-05-14 DIAGNOSIS — D72829 Elevated white blood cell count, unspecified: Secondary | ICD-10-CM | POA: Insufficient documentation

## 2024-05-14 LAB — BASIC METABOLIC PANEL WITH GFR
Anion gap: 13 (ref 5–15)
BUN: 11 mg/dL (ref 6–20)
CO2: 25 mmol/L (ref 22–32)
Calcium: 9.4 mg/dL (ref 8.9–10.3)
Chloride: 100 mmol/L (ref 98–111)
Creatinine, Ser: 0.74 mg/dL (ref 0.44–1.00)
GFR, Estimated: >60 mL/min
Glucose, Bld: 71 mg/dL (ref 70–99)
Potassium: 3.4 mmol/L — ABNORMAL LOW (ref 3.5–5.1)
Sodium: 138 mmol/L (ref 135–145)

## 2024-05-14 LAB — CBC WITH DIFFERENTIAL/PLATELET
Abs Immature Granulocytes: 0.13 10*3/uL — ABNORMAL HIGH (ref 0.00–0.07)
Basophils Absolute: 0.1 10*3/uL (ref 0.0–0.1)
Basophils Relative: 1 %
Eosinophils Absolute: 0.9 10*3/uL — ABNORMAL HIGH (ref 0.0–0.5)
Eosinophils Relative: 5 %
HCT: 43.2 % (ref 36.0–46.0)
Hemoglobin: 13.6 g/dL (ref 12.0–15.0)
Immature Granulocytes: 1 %
Lymphocytes Relative: 21 %
Lymphs Abs: 3.6 10*3/uL (ref 0.7–4.0)
MCH: 27.8 pg (ref 26.0–34.0)
MCHC: 31.5 g/dL (ref 30.0–36.0)
MCV: 88.3 fL (ref 80.0–100.0)
Monocytes Absolute: 1.4 10*3/uL — ABNORMAL HIGH (ref 0.1–1.0)
Monocytes Relative: 8 %
Neutro Abs: 11.3 10*3/uL — ABNORMAL HIGH (ref 1.7–7.7)
Neutrophils Relative %: 64 %
Platelets: 470 10*3/uL — ABNORMAL HIGH (ref 150–400)
RBC: 4.89 MIL/uL (ref 3.87–5.11)
RDW: 15.9 % — ABNORMAL HIGH (ref 11.5–15.5)
WBC: 17.4 10*3/uL — ABNORMAL HIGH (ref 4.0–10.5)
nRBC: 0 % (ref 0.0–0.2)

## 2024-05-14 LAB — HCG, SERUM, QUALITATIVE: Preg, Serum: NEGATIVE

## 2024-05-14 NOTE — ED Triage Notes (Signed)
 Patient reports asthma exacerbation for 1 week, duo nebs/inhaler not helping at home. Denies fever/cough. Patient is alert and oriented x 4. Airway patent, respirations even and unlabored. Skin normal, warm and dry.

## 2024-05-15 MED ORDER — IPRATROPIUM-ALBUTEROL 0.5-2.5 (3) MG/3ML IN SOLN
3.0000 mL | Freq: Once | RESPIRATORY_TRACT | Status: AC
  Administered 2024-05-15: 3 mL via RESPIRATORY_TRACT
  Filled 2024-05-15: qty 3

## 2024-05-15 MED ORDER — POTASSIUM CHLORIDE CRYS ER 20 MEQ PO TBCR
40.0000 meq | EXTENDED_RELEASE_TABLET | Freq: Once | ORAL | Status: AC
  Administered 2024-05-15: 40 meq via ORAL
  Filled 2024-05-15: qty 2

## 2024-05-15 MED ORDER — MAGNESIUM SULFATE IN D5W 1-5 GM/100ML-% IV SOLN
1.0000 g | Freq: Once | INTRAVENOUS | Status: AC
  Administered 2024-05-15: 1 g via INTRAVENOUS
  Filled 2024-05-15: qty 100

## 2024-05-15 MED ORDER — AZITHROMYCIN 250 MG PO TABS: 500.0000 mg | ORAL_TABLET | Freq: Every day | ORAL | 0 refills | Status: AC

## 2024-05-15 MED ORDER — ALBUTEROL SULFATE HFA 108 (90 BASE) MCG/ACT IN AERS
1.0000 | INHALATION_SPRAY | Freq: Once | RESPIRATORY_TRACT | Status: AC
  Administered 2024-05-15: 1 via RESPIRATORY_TRACT
  Filled 2024-05-15: qty 6.7

## 2024-05-15 MED ORDER — METHYLPREDNISOLONE SODIUM SUCC 125 MG IJ SOLR
125.0000 mg | INTRAMUSCULAR | Status: AC
  Administered 2024-05-15: 125 mg via INTRAVENOUS
  Filled 2024-05-15: qty 2

## 2024-05-15 MED ORDER — PREDNISONE 20 MG PO TABS: 40.0000 mg | ORAL_TABLET | Freq: Every day | ORAL | 0 refills | Status: AC

## 2024-05-15 MED ORDER — AZITHROMYCIN 250 MG PO TABS
500.0000 mg | ORAL_TABLET | Freq: Once | ORAL | Status: AC
  Administered 2024-05-15: 500 mg via ORAL
  Filled 2024-05-15: qty 2

## 2024-05-15 NOTE — Discharge Instructions (Addendum)
 Please follow-up with primary care provider in the next 48-72 hours.  Seek emergency care if experiencing any new or worsening symptoms.

## 2024-05-15 NOTE — ED Notes (Signed)
 Ambulated pt, per MD order. Pt 02 Sats stayed 100%. Pt has no c/o of SOB. MD notified

## 2024-05-15 NOTE — ED Provider Notes (Signed)
 " [place] EMERGENCY DEPARTMENT AT [facility] Provider Note   CSN: [id number] Arrival date & time: 05/14/24  1849     Patient presents with: Shortness of Breath   Wendy Morgan is a 49 y.o. female with history of asthma, bronchiectasis.  Presents to ED complaining of asthma exacerbation.  States that symptoms been ongoing for the last 1 week.  Denies any chest pain, lightheadedness, dizziness, weakness, nausea vomiting, fevers.  Reports this feels very similarly to past asthma exacerbations.  States that at home albuterol  is not helping resolve symptoms.  Denies any other concerns.   Shortness of Breath      Prior to Admission medications  Medication Sig Start Date End Date Taking? Authorizing Provider  albuterol  (VENTOLIN  HFA) 108 (90 Base) MCG/ACT inhaler Inhale 2 puffs into the lungs every 4 (four) hours as needed for wheezing or shortness of breath. 02/16/24   Yolande Lamar BROCKS, MD  Budeson-Glycopyrrol-Formoterol  (BREZTRI  AEROSPHERE) 160-9-4.8 MCG/ACT AERO Inhale 2 puffs into the lungs in the morning and at bedtime. 10/24/21   Gladis Leonor CHRISTELLA, MD  cetirizine  (ZYRTEC  ALLERGY) 10 MG tablet Take 1 tablet (10 mg total) by mouth daily. Patient not taking: Reported on 10/20/2023 08/01/23   Kaylean Tupou Savannah, PA-C  ipratropium-albuterol  (DUONEB) 0.5-2.5 (3) MG/3ML SOLN Take 3 mLs by nebulization every 4 (four) hours as needed. 02/16/24   Yolande Lamar BROCKS, MD  Mepolizumab  (NUCALA ) 100 MG/ML SOAJ Inject 1 mL (100 mg total) into the skin every 28 (twenty-eight) days. **needs appt for further refills; no further refills until seen in office** 05/17/23   Cobb, Comer GAILS, NP  predniSONE  (DELTASONE ) 20 MG tablet Take 2 tablets (40 mg total) by mouth daily. 05/04/24   Keith Sor, PA-C  esomeprazole  (NEXIUM ) 40 MG capsule TAKE 1 CAPSULE BY MOUTH DAILY 08/17/21 08/18/21  Patt Alm Macho, MD  sucralfate  (CARAFATE ) 1 g tablet TAKE 1 TABLET BY MOUTH 4 TIMES DAILY-WITH MEALS AND AT BEDTIME  08/17/21 08/18/21  Patt Alm Macho, MD    Allergies: Patient has no known allergies.    Review of Systems  Respiratory:  Positive for shortness of breath.   All other systems reviewed and are negative.   Updated Vital Signs BP 136/81   Pulse 85   Temp 98.2 F (36.8 C) (Oral)   Resp 16   LMP 05/07/2024   SpO2 97%   Physical Exam Vitals and nursing note reviewed.  Constitutional:      General: She is not in acute distress.    Appearance: She is well-developed.  HENT:     Head: Normocephalic and atraumatic.  Eyes:     Conjunctiva/sclera: Conjunctivae normal.  Cardiovascular:     Rate and Rhythm: Normal rate and regular rhythm.     Heart sounds: No murmur heard. Pulmonary:     Effort: Pulmonary effort is normal. No respiratory distress.     Breath sounds: Wheezing present.     Comments: Diffuse bilateral expiratory wheeze Abdominal:     Palpations: Abdomen is soft.     Tenderness: There is no abdominal tenderness.  Musculoskeletal:        General: No swelling.     Cervical back: Neck supple.  Skin:    General: Skin is warm and dry.     Capillary Refill: Capillary refill takes less than 2 seconds.  Neurological:     Mental Status: She is alert.  Psychiatric:        Mood and Affect: Mood normal.     (  all labs ordered are listed, but only abnormal results are displayed) Labs Reviewed  BASIC METABOLIC PANEL WITH GFR - Abnormal; Notable for the following components:      Result Value   Potassium 3.4 (*)    All other components within normal limits  CBC WITH DIFFERENTIAL/PLATELET - Abnormal; Notable for the following components:   WBC 17.4 (*)    RDW 15.9 (*)    Platelets 470 (*)    Neutro Abs 11.3 (*)    Monocytes Absolute 1.4 (*)    Eosinophils Absolute 0.9 (*)    Abs Immature Granulocytes 0.13 (*)    All other components within normal limits  HCG, SERUM, QUALITATIVE    EKG: None  Radiology: DG Chest Port 1 View Result Date: 05/14/2024 CLINICAL DATA:   Asthma exacerbation EXAM: PORTABLE CHEST 1 VIEW COMPARISON:  02/16/2024 FINDINGS: The heart size and mediastinal contours are within normal limits. Both lungs are clear. The visualized skeletal structures are unremarkable. IMPRESSION: No active disease. Electronically Signed   By: Luke Bun M.D.   On: 05/14/2024 20:59    Procedures   Medications Ordered in the ED  magnesium  sulfate IVPB 1 g 100 mL (has no administration in time range)  ipratropium-albuterol  (DUONEB) 0.5-2.5 (3) MG/3ML nebulizer solution 3 mL (3 mLs Nebulization Given 05/15/24 9367)  methylPREDNISolone  sodium succinate (SOLU-MEDROL ) 125 mg/2 mL injection 125 mg (125 mg Intravenous Given 05/15/24 0637)  potassium chloride  SA (KLOR-CON  M) CR tablet 40 mEq (40 mEq Oral Given 05/15/24 9370)    Medical Decision Making Amount and/or Complexity of Data Reviewed Labs: ordered. Radiology: ordered.  Risk Prescription drug management.   This is a 49 year old female with a history of asthma who presents with asthma exacerbation.  On exam HD stable.  Lung sounds have wheezing bilaterally.  No hypoxia.  Abdomen soft and compressible.  Neuroexam baseline.  CBC with leukocytosis 17.4, no anemia.  Metabolic panel with potassium 3.4 repleted with 40 mEq oral potassium.  Chest x-ray unremarkable.  Patient given Solu-Medrol , DuoNeb, magnesium .  At end of shift, patient workup complete.  Signed out to oncoming provider [employer].  Plan of management discussed.   Final diagnoses:  Exacerbation of asthma, unspecified asthma severity, unspecified whether persistent    ED Discharge Orders     None          Wendy Morgan 05/15/24 9356    Trine Raynell Moder, MD 05/15/24 [phone]  "

## 2024-05-15 NOTE — ED Provider Notes (Signed)
 Accepted handoff at shift change from [place] PA-C. Please see prior provider note for more detail.   Briefly: Patient is 49 y.o. Presents to ED complaining of asthma exacerbation. States that symptoms been ongoing for the last 1 week. Denies any chest pain, lightheadedness, dizziness, weakness, nausea vomiting, fevers. Reports this feels very similarly to past asthma exacerbations. States that at home albuterol  is not helping resolve symptoms. Denies any other concerns.   Plan:  - Reassess after asthma treatment. Might need another Duoneb and outpatient steroids.  - Patient ambulated on RA with O2 near 100% the entire time. - CBC with leukocytosis at 17.4. patient stating that she was on a steroid course last week which is probably contributing to the leukocytosis. Patient is feeling a lot better after the asthma treatment and is requesting to go home. Shared decision making with patient who would like to trial ABX for possible atypical PNA given the leukocytosis. Will start on Azithromycin  and have her follow up with PCP. Will also start on short oral steroid course. Patient agreeable with plan.  - Patient afebrile with stable vitals.  Provided with return precautions.  Discharged in good condition.   Hoy Nidia FALCON, [place] 05/15/24 0858    Trine Raynell Moder, MD 05/19/24 [phone]

## 2024-05-23 ENCOUNTER — Emergency Department (HOSPITAL_COMMUNITY)
Admission: EM | Admit: 2024-05-23 | Discharge: 2024-05-23 | Disposition: A | Discharge status: Home / Self Care | Payer: Self-pay | Source: Home / Self Care | Attending: Emergency Medicine | Admitting: Emergency Medicine

## 2024-05-23 ENCOUNTER — Encounter (HOSPITAL_COMMUNITY): Payer: Self-pay

## 2024-05-23 ENCOUNTER — Emergency Department (HOSPITAL_COMMUNITY): Payer: Self-pay

## 2024-05-23 DIAGNOSIS — J45901 Unspecified asthma with (acute) exacerbation: Secondary | ICD-10-CM

## 2024-05-23 LAB — CBC
HCT: 39.1 % (ref 36.0–46.0)
Hemoglobin: 12.6 g/dL (ref 12.0–15.0)
MCH: 28.3 pg (ref 26.0–34.0)
MCHC: 32.2 g/dL (ref 30.0–36.0)
MCV: 87.7 fL (ref 80.0–100.0)
Platelets: 397 10*3/uL (ref 150–400)
RBC: 4.46 MIL/uL (ref 3.87–5.11)
RDW: 15.7 % — ABNORMAL HIGH (ref 11.5–15.5)
WBC: 13.8 10*3/uL — ABNORMAL HIGH (ref 4.0–10.5)
nRBC: 0 % (ref 0.0–0.2)

## 2024-05-23 LAB — BASIC METABOLIC PANEL WITH GFR
Anion gap: 9 (ref 5–15)
BUN: 13 mg/dL (ref 6–20)
CO2: 27 mmol/L (ref 22–32)
Calcium: 8.7 mg/dL — ABNORMAL LOW (ref 8.9–10.3)
Chloride: 104 mmol/L (ref 98–111)
Creatinine, Ser: 0.83 mg/dL (ref 0.44–1.00)
GFR, Estimated: >60 mL/min
Glucose, Bld: 113 mg/dL — ABNORMAL HIGH (ref 70–99)
Potassium: 3.7 mmol/L (ref 3.5–5.1)
Sodium: 140 mmol/L (ref 135–145)

## 2024-05-23 LAB — HCG, SERUM, QUALITATIVE: Preg, Serum: NEGATIVE

## 2024-05-23 MED ORDER — ALBUTEROL SULFATE (2.5 MG/3ML) 0.083% IN NEBU
10.0000 mg/h | INHALATION_SOLUTION | Freq: Once | RESPIRATORY_TRACT | Status: AC
  Administered 2024-05-23: 10 mg/h via RESPIRATORY_TRACT
  Filled 2024-05-23: qty 12

## 2024-05-23 MED ORDER — PREDNISONE 20 MG PO TABS: 40.0000 mg | ORAL_TABLET | Freq: Every day | ORAL | 0 refills | Status: AC

## 2024-05-23 MED ORDER — IPRATROPIUM-ALBUTEROL 0.5-2.5 (3) MG/3ML IN SOLN
3.0000 mL | Freq: Once | RESPIRATORY_TRACT | Status: AC
  Administered 2024-05-23: 3 mL via RESPIRATORY_TRACT
  Filled 2024-05-23: qty 3

## 2024-05-23 MED ORDER — MAGNESIUM SULFATE IN D5W 1-5 GM/100ML-% IV SOLN
2.0000 g | Freq: Once | INTRAVENOUS | Status: AC
  Administered 2024-05-23: 2 g via INTRAVENOUS
  Filled 2024-05-23: qty 200

## 2024-05-23 MED ORDER — ALBUTEROL SULFATE HFA 108 (90 BASE) MCG/ACT IN AERS: 1.0000 | INHALATION_SPRAY | RESPIRATORY_TRACT | 0 refills | Status: AC | PRN

## 2024-05-23 MED ORDER — METHYLPREDNISOLONE SODIUM SUCC 125 MG IJ SOLR
125.0000 mg | INTRAMUSCULAR | Status: AC
  Administered 2024-05-23: 125 mg via INTRAVENOUS
  Filled 2024-05-23: qty 2

## 2024-05-23 MED ORDER — ONDANSETRON HCL 4 MG/2ML IJ SOLN
INTRAMUSCULAR | Status: AC
  Filled 2024-05-23: qty 2

## 2024-05-23 NOTE — ED Triage Notes (Signed)
 Pt. Arrives for shortness of breath. States that her asthma has been flaring up x1 week. Seen previously for the same. States that her symptoms went away, but they came back yesterday.

## 2024-05-23 NOTE — ED Provider Notes (Signed)
 " [place] [facility]GENC[facility] DEPARTMENT AT [facility] Provider Note   CSN: [id number] Arrival date & time: 05/23/24  0215     Patient presents with: Shortness of Breath   Wendy Morgan is a 49 y.o. female with history of chronic asthma, bronchiectasis.  Presents to ED complaining of shortness of breath, wheezing, asthma exacerbation.  States 2 days of shortness of breath and wheezing which she attributes to ongoing asthma exacerbation.  Recently seen on 1/28 by myself and discharged ultimately after passing ambulation trial.  Patient was discharged with antibiotics, steroids and albuterol .  She reports that she has finished steroids, finished antibiotics.  Denies any recent fevers at home.  Denies any nausea or vomiting or leg swelling.  Denies any chest pain.  States that for the last 2 days she has felt more short of breath.  Unrelieved by at home albuterol  inhaler.  States this feels very similar to past asthma exacerbations.  Denies any new features.   Shortness of Breath Associated symptoms: wheezing        Prior to Admission medications  Medication Sig Start Date End Date Taking? Authorizing Provider  albuterol  (VENTOLIN  HFA) 108 (90 Base) MCG/ACT inhaler Inhale 2 puffs into the lungs every 4 (four) hours as needed for wheezing or shortness of breath. 02/16/24   Yolande Lamar BROCKS, MD  Budeson-Glycopyrrol-Formoterol  (BREZTRI  AEROSPHERE) 160-9-4.8 MCG/ACT AERO Inhale 2 puffs into the lungs in the morning and at bedtime. 10/24/21   Gladis Leonor CHRISTELLA, MD  cetirizine  (ZYRTEC  ALLERGY) 10 MG tablet Take 1 tablet (10 mg total) by mouth daily. Patient not taking: Reported on 10/20/2023 08/01/23   Keyaan Lederman [place], PA-C  ipratropium-albuterol  (DUONEB) 0.5-2.5 (3) MG/3ML SOLN Take 3 mLs by nebulization every 4 (four) hours as needed. 02/16/24   Yolande Lamar BROCKS, MD  Mepolizumab  (NUCALA ) 100 MG/ML SOAJ Inject 1 mL (100 mg total) into the skin every 28 (twenty-eight) days. **needs appt for further  refills; no further refills until seen in office** 05/17/23   Malachy Comer GAILS, NP  esomeprazole  (NEXIUM ) 40 MG capsule TAKE 1 CAPSULE BY MOUTH DAILY 08/17/21 08/18/21  Patt Alm Macho, MD  sucralfate  (CARAFATE ) 1 g tablet TAKE 1 TABLET BY MOUTH 4 TIMES DAILY-WITH MEALS AND AT BEDTIME 08/17/21 08/18/21  Patt Alm Macho, MD    Allergies: Patient has no known allergies.    Review of Systems  Respiratory:  Positive for shortness of breath and wheezing.   All other systems reviewed and are negative.   Updated Vital Signs BP 129/62 (BP Location: Right Arm)   Pulse 72   Temp 97.6 F (36.4 C) (Oral)   Resp 16   LMP 05/07/2024   SpO2 97%   Physical Exam Vitals and nursing note reviewed.  Constitutional:      General: She is not in acute distress.    Appearance: She is well-developed.  HENT:     Head: Normocephalic and atraumatic.  Eyes:     Conjunctiva/sclera: Conjunctivae normal.  Cardiovascular:     Rate and Rhythm: Normal rate and regular rhythm.     Heart sounds: No murmur heard. Pulmonary:     Effort: Pulmonary effort is normal. No respiratory distress.     Breath sounds: Wheezing present.  Abdominal:     Palpations: Abdomen is soft.     Tenderness: There is no abdominal tenderness.  Musculoskeletal:        General: No swelling.     Cervical back: Neck supple.  Skin:  General: Skin is warm and dry.     Capillary Refill: Capillary refill takes less than 2 seconds.  Neurological:     Mental Status: She is alert.  Psychiatric:        Mood and Affect: Mood normal.     (all labs ordered are listed, but only abnormal results are displayed) Labs Reviewed  BASIC METABOLIC PANEL WITH GFR - Abnormal; Notable for the following components:      Result Value   Glucose, Bld 113 (*)    Calcium 8.7 (*)    All other components within normal limits  CBC - Abnormal; Notable for the following components:   WBC 13.8 (*)    RDW 15.7 (*)    All other components within normal  limits  HCG, SERUM, QUALITATIVE    EKG: EKG Interpretation Date/Time:  Friday May 23 2024 02:25:44 EST Ventricular Rate:  78 PR Interval:  156 QRS Duration:  99 QT Interval:  388 QTC Calculation: 442 R Axis:   50  Text Interpretation: Sinus rhythm Confirmed by Trine Likes [phone]) on 05/23/2024 6:20:36 AM  Radiology: [facility] Chest 2 View Result Date: 05/23/2024 EXAM: 2 VIEW(S) XRAY OF THE CHEST 05/23/2024 02:45:00 AM COMPARISON: 05/14/2024 CLINICAL HISTORY: Shortness of breath. FINDINGS: LUNGS AND PLEURA: No focal pulmonary opacity. No pleural effusion. No pneumothorax. HEART AND MEDIASTINUM: No acute abnormality of the cardiac and mediastinal silhouettes. BONES AND SOFT TISSUES: No acute osseous abnormality. IMPRESSION: 1. No acute cardiopulmonary pathology. Electronically signed by: Greig Pique MD 05/23/2024 02:49 AM EST RP Workstation: [id number]    Procedures   Medications Ordered in the ED  ipratropium-albuterol  (DUONEB) 0.5-2.5 (3) MG/3ML nebulizer solution 3 mL (3 mLs Nebulization Given 05/23/24 0422)  methylPREDNISolone  sodium succinate (SOLU-MEDROL ) 125 mg/2 mL injection 125 mg (125 mg Intravenous Given 05/23/24 0422)  magnesium  sulfate IVPB 2 g 200 mL (0 g Intravenous Stopped 05/23/24 0438)  ipratropium-albuterol  (DUONEB) 0.5-2.5 (3) MG/3ML nebulizer solution 3 mL (3 mLs Nebulization Given 05/23/24 0522)  albuterol  (PROVENTIL ) (2.5 MG/3ML) 0.083% nebulizer solution (10 mg/hr Nebulization Given 05/23/24 0615)     Medical Decision Making Amount and/or Complexity of Data Reviewed Labs: ordered. Radiology: ordered.  Risk Prescription drug management.   49 year old female presents for evaluation.  Please see HPI.  On exam, the patient is afebrile, nontachycardic.  Lung sounds with wheezing bilaterally, no hypoxia.  Abdomen soft and compressible.  Neuroexam at baseline.  Patient started on DuoNeb, magnesium , Solu-Medrol .  Labs collected to include CBC, BMP, chest  x-ray.  CBC with leukocytosis 13.8, no anemia.  Patient recently finished course of steroid she reports.  Metabolic panel is grossly unremarkable.  hCG negative.  Chest x-ray unremarkable.  After initial DuoNeb, patient reevaluated.  Continues to wheeze.  Second DuoNeb administered and patient was then reevaluated.  Continues to wheeze, states she feels short of breath still.  She was ambulated with pulse ox and maintained oxygen  saturation.  Will provide patient a continuous 2 mg/h DuoNeb and sign patient out to oncoming provider [employer].  Plan of management discussed.    Final diagnoses:  Exacerbation of asthma, unspecified asthma severity, unspecified whether persistent    ED Discharge Orders     None          Wendy Morgan 05/23/24 [id number]    Trine Likes Moder, MD 05/23/24 [phone]  "

## 2024-05-23 NOTE — ED Provider Notes (Signed)
" °  Physical Exam   Vitals:   05/23/24 0224 05/23/24 0530 05/23/24 0544 05/23/24 0615  BP: (!) 143/97  129/62   Pulse: 81 69 72   Resp: 18  16   Temp: 97.8 F (36.6 C)  97.6 F (36.4 C)   TempSrc: Oral  Oral   SpO2: 99% 100% 97% 97%     Physical Exam  Procedures  Procedures  ED Course / MDM    Medical Decision Making Amount and/or Complexity of Data Reviewed Labs: ordered. Radiology: ordered.  Risk Prescription drug management.   Patient received at shift change from prior EDP Lonni Lites PA-C, see their note for initial history, physical exam findings, lab/imaging interpretations, assessment and plan.  49 year old female presenting with asthma exacerbation, 2 days of shortness of breath/wheezing, most recently seen for the same complaint on 1/28.   Patient has received DuoNeb x 2, continuous albuterol  nebulizer, solumedrol, and magnesium    Pertinent labs/imaging results CBC notable for leukocytosis with white blood cell count 13.8, improved from previous; likely secondary to chronic steroid use BMP unremarkable Serum hcg negative CXR: 1. No acute cardiopulmonary pathology.  Patient ambulated on room air without episodes of hypoxia. At time of my reassessment, patient notes symptomatic improvement, does have mild expiratory wheeze noted on exam throughout and is still on continuous albuterol  nebulizer.  Will reassess after treatment has been completed.   At time of my reassessment, wheezing has resolved entirely, patient is noting significant symptomatic improvement.  Patient is not currently established with a primary care provider, she does have albuterol  inhaler at home. Discussed importance of establishing care with a PCP to address her ongoing chronic health conditions, patient voiced understanding.  Will prescribe refill of albuterol  inhaler as well as 5-day course of prednisone .  Strict return precautions discussed, patient voiced understanding and is in  agreement this plan, she is appropriate for discharge at this time.        Wendy Morgan, Wendy Morgan 05/23/24 0749    Garrick Charleston, MD 05/23/24 [phone]  "

## 2024-05-23 NOTE — Discharge Instructions (Addendum)
 Continue use of your albuterol  inhaler every 4 hours as needed for shortness of breath/wheezing, I have provided you with a refill of this.  Start prednisone  and take 2 tablets (40mg ) daily for 5 days.  I have provided you with the contact information for [facility], please contact their office to schedule follow-up to establish care since you do not have a primary care provider locally. Return to the emergency department if your symptoms worsen.

## 2024-05-23 NOTE — ED Notes (Signed)
 Check Pulse Oximetry while ambulating  Pt walked up and down hall way at a brisk pace, O2 stayed between  96-98%
